# Patient Record
Sex: Male | Born: 1980
Health system: Southern US, Community
[De-identification: ages and names within clinical notes are randomized; demographics above are authoritative.]

## PROBLEM LIST (undated history)

## (undated) DIAGNOSIS — K859 Acute pancreatitis without necrosis or infection, unspecified: Secondary | ICD-10-CM

## (undated) DIAGNOSIS — R109 Unspecified abdominal pain: Secondary | ICD-10-CM

## (undated) DIAGNOSIS — R112 Nausea with vomiting, unspecified: Secondary | ICD-10-CM

## (undated) DIAGNOSIS — K55069 Acute infarction of intestine, part and extent unspecified: Secondary | ICD-10-CM

## (undated) HISTORY — DX: Unspecified abdominal pain: R10.9

## (undated) HISTORY — PX: APPENDECTOMY: SHX54

## (undated) HISTORY — DX: Nausea with vomiting, unspecified: R11.2

---

## 2006-05-28 ENCOUNTER — Emergency Department (HOSPITAL_COMMUNITY): Admission: EM | Admit: 2006-05-28 | Discharge: 2006-05-28 | Payer: Self-pay | Admitting: Family Medicine

## 2008-01-14 ENCOUNTER — Inpatient Hospital Stay (HOSPITAL_COMMUNITY): Admission: EM | Admit: 2008-01-14 | Discharge: 2008-01-14 | Payer: Self-pay | Admitting: Emergency Medicine

## 2008-01-14 ENCOUNTER — Encounter (INDEPENDENT_AMBULATORY_CARE_PROVIDER_SITE_OTHER): Payer: Self-pay | Admitting: General Surgery

## 2011-05-03 NOTE — Op Note (Signed)
NAMEJIA, Paul Allen              ACCOUNT NO.:  [id number]   MEDICAL RECORD NO.:  [id number]          PATIENT TYPE:  INP   LOCATION:  2550                         FACILITY:  [facility]   PHYSICIAN:  Sharlet Salina T. Hoxworth, M.D.DATE OF BIRTH:  28-Feb-1981   DATE OF PROCEDURE:  01/14/2008  DATE OF DISCHARGE:                               OPERATIVE REPORT   PRE AND POSTOPERATIVE DIAGNOSIS:  Acute appendicitis.   SURGICAL PROCEDURES:  Laparoscopic appendectomy.   SURGEON:  Lorne Skeens. Hoxworth, M.D.   ANESTHESIA:  General.   BRIEF HISTORY:  Paul Allen is a 29 year old male who presents with  two days of progressive right lower quadrant abdominal pain.  CT scan  has shown evidence of acute appendicitis.  I have recommended proceeding  with laparoscopic appendectomy.  The nature of the procedure,  indications, risks of bleeding, infection were discussed understood.  He  is now brought to operating room for this procedure.   DESCRIPTION OF OPERATION:  The patient brought to the operating room  placed in supine position on the operating table and general  endotracheal anesthesia was induced.  He received preoperative  antibiotics.  The abdomen was widely sterilely prepped and draped.  Foley catheter was placed.  Correct patient and procedure were verified.  Local anesthesia was used to infiltrate the trocar sites.  A 1 cm  incision was made at the umbilicus and dissection carried down to  midline fascia which was sharply incised for 1 cm.  The peritoneum was  entered under direct vision.  Through mattress suture of 0-0 Vicryl, the  Hasson trocar was placed and pneumoperitoneum established.  Under direct  vision a 5 mm trocar was placed in the right upper quadrant and a 12 mm  trocar in the left lower quadrant.  The appendix was exposed lying just  lateral to the terminal ileum, was acutely inflamed with exudate.  No  gangrene or perforation.  The appendix was carefully, bluntly mobilized  and elevated.  Lateral peritoneal attachments were divided further  mobilizing the appendix.  The mesoappendix were then sequentially  divided with harmonic scalpel, completely freed down to its base which  was not inflamed.  The appendix was divided at the tip of the cecum with  a single firing of the blue load 45 mm stapler.  The appendix placed in  EndoCatch bag and brought through the umbilicus.  The abdomen was  irrigated.  Hemostasis assured.  Trocars removed under direct vision,  all CO2 evacuated.  The mattress sutures for the umbilicus.  Skin  incisions were closed with interrupted subcuticular Monocryl and  Dermabond.  The patient taken to recovery in good condition.      Lorne Skeens. Hoxworth, M.D.  Electronically Signed     BTH/MEDQ  D:  01/14/2008  T:  01/14/2008  Job:  [id number]

## 2011-05-03 NOTE — H&P (Signed)
Paul Allen, Paul Allen              ACCOUNT NO.:  [id number]   MEDICAL RECORD NO.:  [id number]          PATIENT TYPE:  INP   LOCATION:  5007                         FACILITY:  [facility]   PHYSICIAN:  Sharlet Salina T. Hoxworth, M.D.DATE OF BIRTH:  04/26/1981   DATE OF ADMISSION:  01/14/2008  DATE OF DISCHARGE:                              HISTORY & PHYSICAL   CHIEF COMPLAINT:  Right lower quadrant abdominal  pain.   HISTORY OF PRESENT ILLNESS:  This patient is a 30 year old white male  who had the onset about 48 hours ago of progressive constant right lower  quadrant pain.  It has gradually worsened.  It is worse with motion.  He  has not had any nausea, vomiting, fever or chills.  Bowel movements  normal.  No history of any chronic GI complaints or similar symptoms in  the past.   PAST MEDICAL HISTORY:  Negative without previous medical or surgical  illness or hospitalizations.   MEDICATIONS:  None.   ALLERGIES:  PENICILLIN, WHICH SHE SAYS CAUSE DIARRHEA.   SOCIAL HISTORY:  Employed.  Smokes a pack of cigarettes per day and  drinks about six beers a day.   FAMILY HISTORY:  Noncontributory.   REVIEW OF SYSTEMS:  All negative.   PHYSICAL EXAM:  VITAL SIGNS:  Temperature is 98.5, pulse 82,  respirations 18, blood pressure 135/75.  GENERAL:  Mildly overweight white male in no acute distress.  SKIN:  Warm, dry.  No rash or infection.  HEENT:  No masses or thyromegaly.  Sclerae nonicteric.  Oropharynx  clear.  LUNGS:  Clear without wheezing or increased work of bleeding, lymph  nodes nonpalpable.  CARDIAC:  Regular rate and rhythm.  No murmurs.  No edema.  No JVD.  ABDOMEN:  Well-localized right lower quadrant tenderness with guarding.  No palpable masses or hepatosplenomegaly.  EXTREMITIES:  No joint swelling deformity, edema.  NEUROLOGIC:  Alert fully oriented.  Motor and sensory exam grossly  normal.   LABORATORY:  White count normal at 6.8, hemoglobin 48.  Urinalysis shows  7 to  10 white cells.  CT scan of the pelvis shows definite evidence of  acute appendicitis without perforation or abscess.   ASSESSMENT/PLAN:  Probable acute appendicitis.  The patient is being  admitted for emergency appendectomy.  Receiving broad-spectrum  antibiotics.      Lorne Skeens. Hoxworth, M.D.  Electronically Signed     BTH/MEDQ  D:  01/14/2008  T:  01/14/2008  Job:  [id number]

## 2011-09-08 ENCOUNTER — Emergency Department (HOSPITAL_COMMUNITY)
Admission: EM | Admit: 2011-09-08 | Discharge: 2011-09-08 | Disposition: A | Discharge status: Home / Self Care | Payer: 59 | Attending: Emergency Medicine | Admitting: Emergency Medicine

## 2011-09-08 ENCOUNTER — Emergency Department (HOSPITAL_COMMUNITY): Payer: 59

## 2011-09-08 DIAGNOSIS — R509 Fever, unspecified: Secondary | ICD-10-CM | POA: Insufficient documentation

## 2011-09-08 DIAGNOSIS — IMO0001 Reserved for inherently not codable concepts without codable children: Secondary | ICD-10-CM | POA: Insufficient documentation

## 2011-09-08 DIAGNOSIS — J189 Pneumonia, unspecified organism: Secondary | ICD-10-CM | POA: Insufficient documentation

## 2011-09-08 DIAGNOSIS — R000 Tachycardia, unspecified: Secondary | ICD-10-CM | POA: Insufficient documentation

## 2011-09-09 LAB — DIFFERENTIAL
Basophils Absolute: 0
Basophils Relative: 0
Eosinophils Absolute: 0.3
Eosinophils Relative: 4
Monocytes Absolute: 0.5

## 2011-09-09 LAB — CBC
HCT: 42.3
Hemoglobin: 14.8
MCHC: 35
Platelets: 192
RDW: 13.1

## 2011-09-09 LAB — URINALYSIS, ROUTINE W REFLEX MICROSCOPIC
Glucose, UA: NEGATIVE
Hgb urine dipstick: NEGATIVE
Ketones, ur: NEGATIVE
Protein, ur: NEGATIVE
pH: 6.5

## 2011-09-09 LAB — URINE CULTURE

## 2011-09-09 LAB — POCT I-STAT CREATININE: Operator id: 151321

## 2011-09-09 LAB — I-STAT 8, (EC8 V) (CONVERTED LAB)
BUN: 9
Bicarbonate: 29.8 — ABNORMAL HIGH
Glucose, Bld: 97
Hemoglobin: 15.3
TCO2: 31
pCO2, Ven: 55.7 — ABNORMAL HIGH
pH, Ven: 7.336 — ABNORMAL HIGH

## 2011-09-09 LAB — URINE MICROSCOPIC-ADD ON

## 2018-05-13 ENCOUNTER — Encounter (HOSPITAL_COMMUNITY): Payer: Self-pay | Admitting: Emergency Medicine

## 2018-05-13 ENCOUNTER — Emergency Department (HOSPITAL_COMMUNITY)
Admission: EM | Admit: 2018-05-13 | Discharge: 2018-05-13 | Disposition: A | Discharge status: Home / Self Care | Payer: Self-pay | Attending: Emergency Medicine | Admitting: Emergency Medicine

## 2018-05-13 ENCOUNTER — Emergency Department (HOSPITAL_COMMUNITY): Payer: Self-pay

## 2018-05-13 DIAGNOSIS — M25461 Effusion, right knee: Secondary | ICD-10-CM | POA: Insufficient documentation

## 2018-05-13 DIAGNOSIS — M25469 Effusion, unspecified knee: Secondary | ICD-10-CM

## 2018-05-13 DIAGNOSIS — M25561 Pain in right knee: Secondary | ICD-10-CM | POA: Insufficient documentation

## 2018-05-13 LAB — CBC WITH DIFFERENTIAL/PLATELET
Basophils Absolute: 0 10*3/uL (ref 0.0–0.1)
Basophils Relative: 0 %
EOS PCT: 0 %
Eosinophils Absolute: 0 10*3/uL (ref 0.0–0.7)
HEMATOCRIT: 42.9 % (ref 39.0–52.0)
Hemoglobin: 15.1 g/dL (ref 13.0–17.0)
LYMPHS PCT: 16 %
Lymphs Abs: 1.6 10*3/uL (ref 0.7–4.0)
MCH: 35.1 pg — ABNORMAL HIGH (ref 26.0–34.0)
MCHC: 35.2 g/dL (ref 30.0–36.0)
MCV: 99.8 fL (ref 78.0–100.0)
Monocytes Absolute: 1 10*3/uL (ref 0.1–1.0)
Monocytes Relative: 9 %
NEUTROS ABS: 7.7 10*3/uL (ref 1.7–7.7)
NEUTROS PCT: 75 %
PLATELETS: 192 10*3/uL (ref 150–400)
RBC: 4.3 MIL/uL (ref 4.22–5.81)
RDW: 15.4 % (ref 11.5–15.5)
WBC: 10.3 10*3/uL (ref 4.0–10.5)

## 2018-05-13 LAB — GRAM STAIN
Gram Stain: NONE SEEN
SPECIAL REQUESTS: NORMAL

## 2018-05-13 LAB — SYNOVIAL CELL COUNT + DIFF, W/ CRYSTALS
CRYSTALS FLUID: NONE SEEN
Eosinophils-Synovial: 0 % (ref 0–1)
LYMPHOCYTES-SYNOVIAL FLD: 1 % (ref 0–20)
MONOCYTE-MACROPHAGE-SYNOVIAL FLUID: 2 % — AB (ref 50–90)
Neutrophil, Synovial: 96 % — ABNORMAL HIGH (ref 0–25)
WBC, Synovial: 24000 /mm3 — ABNORMAL HIGH (ref 0–200)

## 2018-05-13 LAB — BASIC METABOLIC PANEL
ANION GAP: 15 (ref 5–15)
BUN: <5 mg/dL — ABNORMAL LOW (ref 6–20)
CO2: 26 mmol/L (ref 22–32)
Calcium: 8.7 mg/dL — ABNORMAL LOW (ref 8.9–10.3)
Chloride: 101 mmol/L (ref 101–111)
Creatinine, Ser: 0.59 mg/dL — ABNORMAL LOW (ref 0.61–1.24)
GFR calc Af Amer: >60 mL/min (ref >60)
GLUCOSE: 122 mg/dL — AB (ref 65–99)
Potassium: 2.8 mmol/L — ABNORMAL LOW (ref 3.5–5.1)
Sodium: 142 mmol/L (ref 135–145)

## 2018-05-13 LAB — URIC ACID: URIC ACID, SERUM: 8 mg/dL — AB (ref 4.4–7.6)

## 2018-05-13 LAB — C-REACTIVE PROTEIN: CRP: 1.2 mg/dL — AB (ref <1.0)

## 2018-05-13 LAB — SEDIMENTATION RATE: SED RATE: 5 mm/h (ref 0–16)

## 2018-05-13 MED ORDER — LIDOCAINE HCL 2 % IJ SOLN
10.0000 mL | Freq: Once | INTRAMUSCULAR | Status: AC
  Administered 2018-05-13: 200 mg
  Filled 2018-05-13: qty 20

## 2018-05-13 MED ORDER — LIDOCAINE HCL (PF) 1 % IJ SOLN: 30.0000 mL | Freq: Once | INTRAMUSCULAR | Status: DC

## 2018-05-13 MED ORDER — METHYLPREDNISOLONE 4 MG PO TBPK: ORAL_TABLET | ORAL | 0 refills | Status: DC

## 2018-05-13 MED ORDER — IBUPROFEN 800 MG PO TABS: 800.0000 mg | ORAL_TABLET | Freq: Three times a day (TID) | ORAL | 0 refills | Status: DC | PRN

## 2018-05-13 MED ORDER — OXYCODONE-ACETAMINOPHEN 5-325 MG PO TABS
1.0000 | ORAL_TABLET | Freq: Once | ORAL | Status: AC
Start: 2018-05-13 — End: 2018-05-13
  Administered 2018-05-13: 1 via ORAL
  Filled 2018-05-13: qty 1

## 2018-05-13 MED ORDER — POTASSIUM CHLORIDE CRYS ER 20 MEQ PO TBCR
40.0000 meq | EXTENDED_RELEASE_TABLET | Freq: Once | ORAL | Status: AC
  Administered 2018-05-13: 40 meq via ORAL
  Filled 2018-05-13: qty 2

## 2018-05-13 MED ORDER — MAGNESIUM OXIDE 400 (241.3 MG) MG PO TABS
400.0000 mg | ORAL_TABLET | Freq: Once | ORAL | Status: AC
  Administered 2018-05-13: 400 mg via ORAL
  Filled 2018-05-13: qty 1

## 2018-05-13 MED ORDER — POTASSIUM CHLORIDE 10 MEQ/100ML IV SOLN
10.0000 meq | Freq: Once | INTRAVENOUS | Status: AC
  Administered 2018-05-13: 10 meq via INTRAVENOUS
  Filled 2018-05-13: qty 100

## 2018-05-13 NOTE — ED Provider Notes (Addendum)
[facility] DEPT Provider Note   CSN: [id number] Arrival date & time: 05/13/18  0940     History   Chief Complaint Chief Complaint  Patient presents with  . Knee Pain  . Joint Swelling    knee    HPI Paul Allen is a 37 y.o. male.  The history is provided by the patient and medical records. No language interpreter was used.   Paul Allen is an otherwise healthy 37 y.o. male who presents to the Emergency Department complaining of progressively worsening right knee pain x 4 days. Associated with worsening swelling as well. No known trauma or inciting event. No hx of similar. No redness. Patient does report feeling a tingling sensation to his RLE as well as feeling as if his foot was cold compared to the other.  No fever, chills.  Took Tylenol with little improvement.  Pain is worse with moving the knee or ambulation.  History reviewed. No pertinent past medical history.  There are no active problems to display for this patient.   Past Surgical History:  Procedure Laterality Date  . APPENDECTOMY          Home Medications    Prior to Admission medications   Medication Sig Start Date End Date Taking? Authorizing Provider  acetaminophen (TYLENOL) 500 MG tablet Take 500 mg by mouth every 6 (six) hours as needed for mild pain.   Yes [provider]  ibuprofen (ADVIL,MOTRIN) 800 MG tablet Take 1 tablet (800 mg total) by mouth every 8 (eight) hours as needed for mild pain or moderate pain. 05/13/18   Ward, Ozella Almond, PA-C  methylPREDNISolone (MEDROL DOSEPAK) 4 MG TBPK tablet Take as directed on package. 05/13/18   Ward, Ozella Almond, PA-C    Family History No family history on file.  Social History Social History   Tobacco Use  . Smoking status: Current Every Day Smoker    Types: Cigarettes  . Smokeless tobacco: Never Used  Substance Use Topics  . Alcohol use: Yes  . Drug use: Not on file     Allergies     Penicillins   Review of Systems Review of Systems  Musculoskeletal: Positive for arthralgias and joint swelling.  Skin: Negative for color change and wound.  Neurological: Positive for numbness.  All other systems reviewed and are negative.    Physical Exam Updated Vital Signs BP (!) 131/97   Pulse 77   Temp 98.9 F (37.2 C) (Oral)   Resp 17   Ht '5\' 9"'  (1.753 m)   Wt 77.1 kg (170 lb)   SpO2 100%   BMI 25.10 kg/m   Physical Exam  Constitutional: He is oriented to person, place, and time. He appears well-developed and well-nourished. No distress.  HENT:  Head: Normocephalic and atraumatic.  Neck: Neck supple.  Cardiovascular: Normal rate, regular rhythm and normal heart sounds.  No murmur heard. Pulmonary/Chest: Effort normal and breath sounds normal. No respiratory distress.  Musculoskeletal:  Right knee with diffuse tenderness. Decreased ROM of the knee. + swelling. Sensation intact. 2+ pulse in left LE. Unable to palpate DP pulse initially, but doppler pulse found. Cap refill 2-3 seconds. RLE from mid-shin down cooler to the touch when compared to LLE.   Neurological: He is alert and oriented to person, place, and time.  Skin: Skin is warm and dry.  Nursing note and vitals reviewed.    ED Treatments / Results  Labs (all labs ordered are listed, but only abnormal  results are displayed) Labs Reviewed  URIC ACID - Abnormal; Notable for the following components:      Result Value   Uric Acid, Serum 8.0 (*)    All other components within normal limits  SYNOVIAL CELL COUNT + DIFF, W/ CRYSTALS - Abnormal; Notable for the following components:   Appearance-Synovial TURBID (*)    WBC, Synovial 24,000 (*)    Neutrophil, Synovial 96 (*)    Monocyte-Macrophage-Synovial Fluid 2 (*)    All other components within normal limits  CBC WITH DIFFERENTIAL/PLATELET - Abnormal; Notable for the following components:   MCH 35.1 (*)    All other components within normal limits   BASIC METABOLIC PANEL - Abnormal; Notable for the following components:   Potassium 2.8 (*)    Glucose, Bld 122 (*)    BUN <5 (*)    Creatinine, Ser 0.59 (*)    Calcium 8.7 (*)    All other components within normal limits  GRAM STAIN  BODY FLUID CULTURE  ANAEROBIC CULTURE  GONOCOCCUS CULTURE  SEDIMENTATION RATE  C-REACTIVE PROTEIN    EKG None  Radiology Dg Knee Complete 4 Views Right  Result Date: 05/13/2018 CLINICAL DATA:  Right knee pain and swelling since Thursday without injury. EXAM: RIGHT KNEE - COMPLETE 4+ VIEW COMPARISON:  None. FINDINGS: Large effusion in the suprapatellar bursa. Mild marginal spurring of the patella. No fracture or acute bony findings. IMPRESSION: 1. Large knee effusion, cause uncertain. 2. Mild marginal spurring of the patella. Electronically Signed   By: Van Clines M.D.   On: 05/13/2018 10:20    Procedures Procedures (including critical care time)  Medications Ordered in ED Medications  potassium chloride 10 mEq in 100 mL IVPB (10 mEq Intravenous New Bag/Given 05/13/18 1445)  lidocaine (XYLOCAINE) 2 % (with pres) injection 200 mg (200 mg Infiltration Given by Other 05/13/18 1320)  potassium chloride SA (K-DUR,KLOR-CON) CR tablet 40 mEq (40 mEq Oral Given 05/13/18 1444)  magnesium oxide (MAG-OX) tablet 400 mg (400 mg Oral Given 05/13/18 1444)  oxyCODONE-acetaminophen (PERCOCET/ROXICET) 5-325 MG per tablet 1-2 tablet (1 tablet Oral Given 05/13/18 1501)     Initial Impression / Assessment and Plan / ED Course  I have reviewed the triage vital signs and the nursing notes.  Pertinent labs & imaging results that were available during my care of the patient were reviewed by me and considered in my medical decision making (see chart for details).    Paul Allen is a 37 y.o. male who presents to ED for atraumatic right knee pain / swelling over the last 4 days. On initial examination, knee with significant amount of swelling to the right knee.  He had 2+ DP to left LE, but very faint pulse to RLE. Dr. Darl Householder notified of pulse discrepancy and evaluated patient. Bedside U/S performed by Dr. Darl Householder without signs of DVT. Young male with only risk of claudication being + smoker. Given large amount of fluid to the knee, likely fluid is compressing vasculature leading to diminished pulse. Knee aspiration performed: 24, 000 WBC's. Uric acid minimally elevated at 8.0, however no crystals seen on fluid analysis.  ESR normal with value 5.  Less likely this is due to septic arthritis.  Following knee aspiration, right lower extremity pain warmer and pulse strengthened. Case was discussed with on-call orthopedics, Dr. Marcelino Scot, who agrees this is likely more of an inflammatory arthropathy than acute infectious.  Recommends outpatient follow-up.  Will treat with steroids and as needed pain medication.  Patient  understands home care instructions/follow-up care. NVI on re-evaluation prior to discharge. Reasons to return to ER discussed as well.  All questions answered.    Patient seen by and discussed with Dr. Darl Householder who agrees with treatment plan.    Final Clinical Impressions(s) / ED Diagnoses   Final diagnoses:  Acute pain of right knee  Knee swelling    ED Discharge Orders        Ordered    methylPREDNISolone (MEDROL DOSEPAK) 4 MG TBPK tablet     05/13/18 1512    ibuprofen (ADVIL,MOTRIN) 800 MG tablet  Every 8 hours PRN     05/13/18 1512       Ward, Ozella Almond, PA-C 05/13/18 1538    Drenda Freeze, MD 05/14/18 [phone]

## 2018-05-13 NOTE — ED Notes (Signed)
ED Provider at bedside. 

## 2018-05-13 NOTE — ED Triage Notes (Signed)
Pt c/o right knee pain and swelling since Thursday. Denies any falls or injuries. Reports on way over here had tingling in faces and arms. No neuro deficits at this time.

## 2018-05-13 NOTE — ED Notes (Signed)
ED Providers at bedside

## 2018-05-13 NOTE — ED Notes (Signed)
Patient requesting pain medication, PA made aware.

## 2018-05-13 NOTE — Discharge Instructions (Signed)
It was my pleasure taking care of you today!   Ibuprofen as needed for pain. Take steroid dose pack as directed. This will help with the swelling / inflammation.   Please call the orthopedic doctor listed on Tuesday morning to schedule a follow up appointment.   Return to ER for new or worsening symptoms, any additional concerns.

## 2018-05-13 NOTE — ED Notes (Signed)
Patient placed on cardiac monitoring.

## 2018-05-18 LAB — CULTURE, BODY FLUID W GRAM STAIN -BOTTLE: Culture: NO GROWTH

## 2018-10-24 ENCOUNTER — Other Ambulatory Visit: Payer: Self-pay

## 2018-10-24 ENCOUNTER — Inpatient Hospital Stay (HOSPITAL_COMMUNITY)
Admission: EM | Admit: 2018-10-24 | Discharge: 2018-10-28 | DRG: 439 | Disposition: A | Discharge status: Home / Self Care | Payer: Self-pay | Attending: Internal Medicine | Admitting: Internal Medicine

## 2018-10-24 ENCOUNTER — Emergency Department (HOSPITAL_COMMUNITY): Payer: Self-pay

## 2018-10-24 ENCOUNTER — Encounter (HOSPITAL_COMMUNITY): Payer: Self-pay

## 2018-10-24 DIAGNOSIS — M25461 Effusion, right knee: Secondary | ICD-10-CM | POA: Diagnosis present

## 2018-10-24 DIAGNOSIS — Z9049 Acquired absence of other specified parts of digestive tract: Secondary | ICD-10-CM

## 2018-10-24 DIAGNOSIS — D7589 Other specified diseases of blood and blood-forming organs: Secondary | ICD-10-CM | POA: Diagnosis present

## 2018-10-24 DIAGNOSIS — E538 Deficiency of other specified B group vitamins: Secondary | ICD-10-CM | POA: Diagnosis present

## 2018-10-24 DIAGNOSIS — K852 Alcohol induced acute pancreatitis without necrosis or infection: Principal | ICD-10-CM | POA: Diagnosis present

## 2018-10-24 DIAGNOSIS — E871 Hypo-osmolality and hyponatremia: Secondary | ICD-10-CM | POA: Diagnosis present

## 2018-10-24 DIAGNOSIS — F17210 Nicotine dependence, cigarettes, uncomplicated: Secondary | ICD-10-CM | POA: Diagnosis present

## 2018-10-24 DIAGNOSIS — E876 Hypokalemia: Secondary | ICD-10-CM | POA: Diagnosis present

## 2018-10-24 DIAGNOSIS — D6489 Other specified anemias: Secondary | ICD-10-CM | POA: Diagnosis present

## 2018-10-24 DIAGNOSIS — Z880 Allergy status to penicillin: Secondary | ICD-10-CM

## 2018-10-24 DIAGNOSIS — K529 Noninfective gastroenteritis and colitis, unspecified: Secondary | ICD-10-CM | POA: Diagnosis present

## 2018-10-24 DIAGNOSIS — F1010 Alcohol abuse, uncomplicated: Secondary | ICD-10-CM | POA: Diagnosis present

## 2018-10-24 DIAGNOSIS — K859 Acute pancreatitis without necrosis or infection, unspecified: Secondary | ICD-10-CM | POA: Diagnosis present

## 2018-10-24 DIAGNOSIS — R030 Elevated blood-pressure reading, without diagnosis of hypertension: Secondary | ICD-10-CM | POA: Diagnosis not present

## 2018-10-24 LAB — CBC
HCT: 41.3 % (ref 39.0–52.0)
Hemoglobin: 14.5 g/dL (ref 13.0–17.0)
MCH: 38.9 pg — ABNORMAL HIGH (ref 26.0–34.0)
MCHC: 35.1 g/dL (ref 30.0–36.0)
MCV: 110.7 fL — ABNORMAL HIGH (ref 80.0–100.0)
NRBC: 0 % (ref 0.0–0.2)
Platelets: 242 10*3/uL (ref 150–400)
RBC: 3.73 MIL/uL — ABNORMAL LOW (ref 4.22–5.81)
RDW: 17.2 % — AB (ref 11.5–15.5)
WBC: 11.1 10*3/uL — AB (ref 4.0–10.5)

## 2018-10-24 LAB — URINALYSIS, ROUTINE W REFLEX MICROSCOPIC
Bacteria, UA: NONE SEEN
Bilirubin Urine: NEGATIVE
Glucose, UA: NEGATIVE mg/dL
Hgb urine dipstick: NEGATIVE
Ketones, ur: 5 mg/dL — AB
Leukocytes, UA: NEGATIVE
NITRITE: NEGATIVE
PROTEIN: NEGATIVE mg/dL
SPECIFIC GRAVITY, URINE: 1.021 (ref 1.005–1.030)
pH: 5 (ref 5.0–8.0)

## 2018-10-24 LAB — COMPREHENSIVE METABOLIC PANEL
ALT: 48 U/L — AB (ref 0–44)
AST: 109 U/L — AB (ref 15–41)
Albumin: 4.5 g/dL (ref 3.5–5.0)
Alkaline Phosphatase: 67 U/L (ref 38–126)
Anion gap: 10 (ref 5–15)
BUN: 6 mg/dL (ref 6–20)
CO2: 27 mmol/L (ref 22–32)
CREATININE: 0.53 mg/dL — AB (ref 0.61–1.24)
Calcium: 8.7 mg/dL — ABNORMAL LOW (ref 8.9–10.3)
Chloride: 96 mmol/L — ABNORMAL LOW (ref 98–111)
GFR calc non Af Amer: >60 mL/min (ref >60)
Glucose, Bld: 115 mg/dL — ABNORMAL HIGH (ref 70–99)
Potassium: 3.1 mmol/L — ABNORMAL LOW (ref 3.5–5.1)
SODIUM: 133 mmol/L — AB (ref 135–145)
TOTAL PROTEIN: 7.1 g/dL (ref 6.5–8.1)
Total Bilirubin: 1.4 mg/dL — ABNORMAL HIGH (ref 0.3–1.2)

## 2018-10-24 LAB — LIPASE, BLOOD: LIPASE: 187 U/L — AB (ref 11–51)

## 2018-10-24 LAB — MAGNESIUM: Magnesium: 1.6 mg/dL — ABNORMAL LOW (ref 1.7–2.4)

## 2018-10-24 MED ORDER — THIAMINE HCL 100 MG/ML IJ SOLN
100.0000 mg | Freq: Every day | INTRAMUSCULAR | Status: DC
  Filled 2018-10-24 (×2): qty 2

## 2018-10-24 MED ORDER — FOLIC ACID 1 MG PO TABS
1.0000 mg | ORAL_TABLET | Freq: Every day | ORAL | Status: DC
  Administered 2018-10-25 – 2018-10-28 (×3): 1 mg via ORAL
  Filled 2018-10-24 (×4): qty 1

## 2018-10-24 MED ORDER — ENOXAPARIN SODIUM 40 MG/0.4ML ~~LOC~~ SOLN
40.0000 mg | Freq: Every day | SUBCUTANEOUS | Status: DC
  Administered 2018-10-24 – 2018-10-27 (×4): 40 mg via SUBCUTANEOUS
  Filled 2018-10-24 (×4): qty 0.4

## 2018-10-24 MED ORDER — HYDROMORPHONE HCL 1 MG/ML IJ SOLN
1.0000 mg | Freq: Once | INTRAMUSCULAR | Status: AC
  Administered 2018-10-24: 1 mg via INTRAVENOUS
  Filled 2018-10-24: qty 1

## 2018-10-24 MED ORDER — SODIUM CHLORIDE 0.9 % IV BOLUS
1000.0000 mL | Freq: Once | INTRAVENOUS | Status: AC
  Administered 2018-10-24: 1000 mL via INTRAVENOUS

## 2018-10-24 MED ORDER — IOPAMIDOL (ISOVUE-300) INJECTION 61%
INTRAVENOUS | Status: AC
  Filled 2018-10-24: qty 100

## 2018-10-24 MED ORDER — LORAZEPAM 2 MG/ML IJ SOLN
1.0000 mg | Freq: Four times a day (QID) | INTRAMUSCULAR | Status: AC | PRN
  Administered 2018-10-25: 1 mg via INTRAVENOUS
  Filled 2018-10-24: qty 1

## 2018-10-24 MED ORDER — LORAZEPAM 1 MG PO TABS
1.0000 mg | ORAL_TABLET | Freq: Four times a day (QID) | ORAL | Status: AC | PRN
  Filled 2018-10-24: qty 1

## 2018-10-24 MED ORDER — VITAMIN B-1 100 MG PO TABS
100.0000 mg | ORAL_TABLET | Freq: Every day | ORAL | Status: DC
  Administered 2018-10-25 – 2018-10-28 (×4): 100 mg via ORAL
  Filled 2018-10-24 (×4): qty 1

## 2018-10-24 MED ORDER — POTASSIUM CHLORIDE IN NACL 40-0.9 MEQ/L-% IV SOLN
INTRAVENOUS | Status: DC
  Administered 2018-10-24 – 2018-10-25 (×2): 175 mL/h via INTRAVENOUS
  Filled 2018-10-24 (×3): qty 1000

## 2018-10-24 MED ORDER — IOPAMIDOL (ISOVUE-300) INJECTION 61%
100.0000 mL | Freq: Once | INTRAVENOUS | Status: AC | PRN
  Administered 2018-10-24: 100 mL via INTRAVENOUS

## 2018-10-24 MED ORDER — SODIUM CHLORIDE (PF) 0.9 % IJ SOLN
INTRAMUSCULAR | Status: AC
  Filled 2018-10-24: qty 50

## 2018-10-24 MED ORDER — ADULT MULTIVITAMIN W/MINERALS CH
1.0000 | ORAL_TABLET | Freq: Every day | ORAL | Status: DC
  Administered 2018-10-25 – 2018-10-28 (×3): 1 via ORAL
  Filled 2018-10-24 (×4): qty 1

## 2018-10-24 MED ORDER — HYDROMORPHONE HCL 1 MG/ML IJ SOLN: 1.0000 mg | INTRAMUSCULAR | Status: DC | PRN

## 2018-10-24 MED ORDER — FENTANYL CITRATE (PF) 100 MCG/2ML IJ SOLN
25.0000 ug | Freq: Once | INTRAMUSCULAR | Status: AC
  Administered 2018-10-24: 25 ug via INTRAVENOUS
  Filled 2018-10-24: qty 2

## 2018-10-24 NOTE — ED Provider Notes (Signed)
[facility]-EMERGENCY DEPT Provider Note   CSN: [id number] Arrival date & time: 10/24/18  1605     History   Chief Complaint Chief Complaint  Patient presents with  . Abdominal Pain    HPI ARTHOR GORTER is a 37 y.o. male.  37 y.o male with no PMH presents to the ED with a chief complaint of abdominal pain since this morning. He describes his pain as a constant pulling, pressure located on the left upper quadrant with radiation to the epigastric region. He reports his pain worsen when walking or taking a deep breath. He has not tried any therapy for pain relieve. Patient does report a previous appendectomy but no other surgical interventions to his abdomen.He reports drinking 5-6 malt beers daily. Patient also reports changes in his urine habits and having some urgency while attempting to urinate.He denies any nausea, vomiting, diarrhea, chest pain or shortness of breath.      History reviewed. No pertinent past medical history.  There are no active problems to display for this patient.   Past Surgical History:  Procedure Laterality Date  . APPENDECTOMY          Home Medications    Prior to Admission medications   Medication Sig Start Date End Date Taking? Authorizing Provider  acetaminophen (TYLENOL) 500 MG tablet Take 1,000 mg by mouth every 6 (six) hours as needed.   Yes [provider]  ibuprofen (ADVIL,MOTRIN) 200 MG tablet Take 400 mg by mouth daily as needed.   Yes [provider]  ibuprofen (ADVIL,MOTRIN) 800 MG tablet Take 1 tablet (800 mg total) by mouth every 8 (eight) hours as needed for mild pain or moderate pain. Patient not taking: Reported on 10/24/2018 05/13/18   Ward, Chase Picket, PA-C  methylPREDNISolone (MEDROL DOSEPAK) 4 MG TBPK tablet Take as directed on package. Patient not taking: Reported on 10/24/2018 05/13/18   Ward, Chase Picket, PA-C    Family History No family history on file.  Social  History Social History   Tobacco Use  . Smoking status: Current Every Day Smoker    Packs/day: 1.50    Types: Cigarettes  . Smokeless tobacco: Never Used  Substance Use Topics  . Alcohol use: Yes    Alcohol/week: 5.0 standard drinks    Types: 5 Cans of beer per week  . Drug use: Yes    Types: Marijuana     Allergies   Penicillins   Review of Systems Review of Systems  Constitutional: Negative for fever.  HENT: Negative for sore throat.   Respiratory: Negative for shortness of breath.   Cardiovascular: Negative for chest pain.  Gastrointestinal: Positive for abdominal pain. Negative for constipation, diarrhea, nausea and vomiting.  Genitourinary: Negative for dysuria and flank pain.  Musculoskeletal: Negative for back pain.  Skin: Negative for pallor and wound.  Neurological: Negative for light-headedness.     Physical Exam Updated Vital Signs BP (!) 163/101 (BP Location: Right Arm)   Pulse 70   Temp 98.6 F (37 C) (Oral)   Resp 16   Ht 5\' 9"  (1.753 m)   Wt 78 kg   SpO2 98%   BMI 25.40 kg/m   Physical Exam  Constitutional: He is oriented to person, place, and time. He appears well-developed and well-nourished.  HENT:  Head: Normocephalic and atraumatic.  Mouth/Throat: Oropharynx is clear and moist.  Eyes: Pupils are equal, round, and reactive to light. No scleral icterus.  Neck: Normal range of motion.  Cardiovascular: Normal  heart sounds.  Pulmonary/Chest: Effort normal and breath sounds normal. He has no wheezes. He exhibits no tenderness.  Abdominal: Soft. Bowel sounds are normal. He exhibits no distension. There is tenderness in the epigastric area, periumbilical area and left upper quadrant. There is rigidity and guarding. There is no rebound, no CVA tenderness and no tenderness at McBurney's point.  Musculoskeletal: He exhibits no tenderness or deformity.  Neurological: He is alert and oriented to person, place, and time.  Skin: Skin is warm and dry.   Nursing note and vitals reviewed.    ED Treatments / Results  Labs (all labs ordered are listed, but only abnormal results are displayed) Labs Reviewed  LIPASE, BLOOD - Abnormal; Notable for the following components:      Result Value   Lipase 187 (*)    All other components within normal limits  COMPREHENSIVE METABOLIC PANEL - Abnormal; Notable for the following components:   Sodium 133 (*)    Potassium 3.1 (*)    Chloride 96 (*)    Glucose, Bld 115 (*)    Creatinine, Ser 0.53 (*)    Calcium 8.7 (*)    AST 109 (*)    ALT 48 (*)    Total Bilirubin 1.4 (*)    All other components within normal limits  CBC - Abnormal; Notable for the following components:   WBC 11.1 (*)    RBC 3.73 (*)    MCV 110.7 (*)    MCH 38.9 (*)    RDW 17.2 (*)    All other components within normal limits  URINALYSIS, ROUTINE W REFLEX MICROSCOPIC - Abnormal; Notable for the following components:   APPearance TURBID (*)    Ketones, ur 5 (*)    All other components within normal limits    EKG None  Radiology Ct Abdomen Pelvis W Contrast  Result Date: 10/24/2018 CLINICAL DATA:  Abdominal pain EXAM: CT ABDOMEN AND PELVIS WITH CONTRAST TECHNIQUE: Multidetector CT imaging of the abdomen and pelvis was performed using the standard protocol following bolus administration of intravenous contrast. CONTRAST:  ISOVUE-300 IOPAMIDOL (ISOVUE-300) INJECTION 61% COMPARISON:  CT 01/13/2008 FINDINGS: Lower chest: Lung bases demonstrate no acute consolidation or effusion. The heart size is within normal limits. Hepatobiliary: Heterogeneous hypodensity near the gallbladder fossa and within the inferior right hepatic lobe. No calcified gallstone. No biliary dilatation Pancreas: Indistinct with surrounding edema and fluid in the left anterior pararenal space suspect for pancreatitis. No necrosis. No organized fluid collection. Spleen: Normal in size without focal abnormality. Adrenals/Urinary Tract: Adrenal glands are  unremarkable. Kidneys are normal, without renal calculi, focal lesion, or hydronephrosis. Bladder is unremarkable. Stomach/Bowel: Stomach is within normal limits. Duodenum is also slightly indistinct. Status post appendectomy. Prominent submucosal fat deposition in the right colon consistent with chronic inflammatory process. Slight wall thickening and mucosal enhancement involving the sigmoid colon. Vascular/Lymphatic: Nonaneurysmal aorta. Minimal aortic atherosclerosis. No significantly enlarged lymph nodes Reproductive: Prostate is unremarkable. Other: No free air.  Trace free fluid in the pelvis and abdomen Musculoskeletal: No acute or significant osseous findings. IMPRESSION: 1. Indistinct appearance of the pancreas with surrounding inflammation and small fluid consistent with acute pancreatitis. No organized fluid collection. No necrosis. 2. Slightly indistinct appearance of the second and third portion of duodenum, possible duodenitis or reactive change from pancreatic inflammation 3. Slightly thickened appearance of the sigmoid colon with mildly prominent mucosal enhancement, suggesting low-grade inflammatory process/colitis 4. Heterogenous hypodensity within the right lobe of liver and adjacent to the gallbladder fossa, may  reflect heterogenous fat infiltration of the liver. Electronically Signed   By: Jasmine Pang M.D.   On: 10/24/2018 21:11    Procedures Procedures (including critical care time)  Medications Ordered in ED Medications  iopamidol (ISOVUE-300) 61 % injection (has no administration in time range)  sodium chloride (PF) 0.9 % injection (has no administration in time range)  sodium chloride 0.9 % bolus 1,000 mL (has no administration in time range)  sodium chloride 0.9 % bolus 1,000 mL (1,000 mLs Intravenous New Bag/Given 10/24/18 2011)  fentaNYL (SUBLIMAZE) injection 25 mcg (25 mcg Intravenous Given 10/24/18 2009)  iopamidol (ISOVUE-300) 61 % injection 100 mL (100 mLs Intravenous  Contrast Given 10/24/18 2021)  HYDROmorphone (DILAUDID) injection 1 mg (1 mg Intravenous Given 10/24/18 2057)     Initial Impression / Assessment and Plan / ED Course  I have reviewed the triage vital signs and the nursing notes.  Pertinent labs & imaging results that were available during my care of the patient were reviewed by me and considered in my medical decision making (see chart for details).    Presents with new onset of left upper quadrant pain which began this morning radiating into the epigastric region.  Taking any medication for pain relief.     During examination patient is exquisitely tender on the left upper quadrant along with epigastric region along with generalized abdomen. CMP showed slight decrease in sodium, potassium slightly decreased at 3.1 however patient denies any weakness at this time.  AST and ALTs are elevated AST 109 ALT 48 lipase was 187, CBC showed 11.1 will order CT check for any pancreatitis, liver pathology, gallbladder pathology. UA showed no nitrites, leukocytes, bacteria, patient does report some urgency.  CT abdomen and pelvis showed:  1. Indistinct appearance of the pancreas with surrounding  inflammation and small fluid consistent with acute pancreatitis. No  organized fluid collection. No necrosis.  2. Slightly indistinct appearance of the second and third portion of  duodenum, possible duodenitis or reactive change from pancreatic  inflammation  3. Slightly thickened appearance of the sigmoid colon with mildly  prominent mucosal enhancement, suggesting low-grade inflammatory  process/colitis  4. Heterogenous hypodensity within the right lobe of liver and  adjacent to the gallbladder fossa, may reflect heterogenous fat  infiltration of the liver.   9:34 PM chest pain at this time is controlled after 1 of Dilaudid will provide him with another bolus, I have discussed this patient's care with Dr. Clarice Pole who further recommends admission, will  call hospitalist to admit for acute pancreatitis.   9:46 PM Spoke to hospitalist who will evaluate patient and admit for acute pancreatitis.  Final Clinical Impressions(s) / ED Diagnoses   Final diagnoses:  Acute pancreatitis, unspecified complication status, unspecified pancreatitis type    ED Discharge Orders    None       Claude Manges, Cordelia Poche 10/24/18 2146    Arby Barrette, MD 10/25/18 1257

## 2018-10-24 NOTE — ED Triage Notes (Signed)
Pt states LUQ abd pain since 1200 today. Pt describing pain as constant. Pt states pain has spread to bilateral flanks. Pt states increased pain with movement and respirations.

## 2018-10-24 NOTE — ED Notes (Signed)
ED TO INPATIENT HANDOFF REPORT  Name/Age/Gender Paul Allen 37 y.o. male  Code Status    Code Status Orders  (From admission, onward)         Start     Ordered   10/24/18 2202  Full code  Continuous     10/24/18 2205        Code Status History    This patient has a current code status but no historical code status.      Home/SNF/Other Home  Chief Complaint severe abdominal pains, upper back pains  Level of Care/Admitting Diagnosis ED Disposition    ED Disposition Condition Comment   Admit  Hospital Area: [facility] [[id number]]  Level of Care: Med-Surg [16]  Diagnosis: Acute pancreatitis [577.0.ICD-9-CM]  Admitting Physician: Shela Leff [[id number]]  Attending Physician: Shela Leff [[id number]]  PT Class (Do Not Modify): Observation [104]  PT Acc Code (Do Not Modify): Observation [10022]       Medical History History reviewed. No pertinent past medical history.  Allergies Allergies  Allergen Reactions  . Penicillins Rash    Has patient had a PCN reaction causing immediate rash, facial/tongue/throat swelling, SOB or lightheadedness with hypotension: yes Has patient had a PCN reaction causing severe rash involving mucus membranes or skin necrosis: Yes Has patient had a PCN reaction that required hospitalization: No Has patient had a PCN reaction occurring within the last 10 years: No If all of the above answers are "NO", then may proceed with Cephalosporin use.     IV Location/Drains/Wounds Patient Lines/Drains/Airways Status   Active Line/Drains/Airways    Name:   Placement date:   Placement time:   Site:   Days:   Peripheral IV 10/24/18 Left Arm   10/24/18    2011    Arm   less than 1          Labs/Imaging Results for orders placed or performed during the hospital encounter of 10/24/18 (from the past 48 hour(s))  Lipase, blood     Status: Abnormal   Collection Time: 10/24/18  4:40 PM  Result Value Ref Range    Lipase 187 (H) 11 - 51 U/L    Comment: Performed at [facility], [place] [address], [place], [place] [address]  Comprehensive metabolic panel     Status: Abnormal   Collection Time: 10/24/18  4:40 PM  Result Value Ref Range   Sodium 133 (L) 135 - 145 mmol/L   Potassium 3.1 (L) 3.5 - 5.1 mmol/L   Chloride 96 (L) 98 - 111 mmol/L   CO2 27 22 - 32 mmol/L   Glucose, Bld 115 (H) 70 - 99 mg/dL   BUN 6 6 - 20 mg/dL   Creatinine, Ser 0.53 (L) 0.61 - 1.24 mg/dL   Calcium 8.7 (L) 8.9 - 10.3 mg/dL   Total Protein 7.1 6.5 - 8.1 g/dL   Albumin 4.5 3.5 - 5.0 g/dL   AST 109 (H) 15 - 41 U/L   ALT 48 (H) 0 - 44 U/L   Alkaline Phosphatase 67 38 - 126 U/L   Total Bilirubin 1.4 (H) 0.3 - 1.2 mg/dL   GFR calc non Af Amer >60 >60 mL/min   GFR calc Af Amer >60 >60 mL/min    Comment: (NOTE) The eGFR has been calculated using the CKD EPI equation. This calculation has not been validated in all clinical situations. eGFR's persistently <60 mL/min signify possible Chronic Kidney Disease.    Anion gap 10 5 - 15  Comment: Performed at [facility], [place] [address] [place], [place] [address]  CBC     Status: Abnormal   Collection Time: 10/24/18  4:40 PM  Result Value Ref Range   WBC 11.1 (H) 4.0 - 10.5 K/uL   RBC 3.73 (L) 4.22 - 5.81 MIL/uL   Hemoglobin 14.5 13.0 - 17.0 g/dL   HCT 41.3 39.0 - 52.0 %   MCV 110.7 (H) 80.0 - 100.0 fL   MCH 38.9 (H) 26.0 - 34.0 pg   MCHC 35.1 30.0 - 36.0 g/dL   RDW 17.2 (H) 11.5 - 15.5 %   Platelets 242 150 - 400 K/uL   nRBC 0.0 0.0 - 0.2 %    Comment: Performed at [facility], [place] [address] [place], [place] [address]  Urinalysis, Routine w reflex microscopic     Status: Abnormal   Collection Time: 10/24/18  4:40 PM  Result Value Ref Range   Color, Urine YELLOW YELLOW   APPearance TURBID (A) CLEAR   Specific Gravity, Urine 1.021 1.005 - 1.030   pH 5.0 5.0 - 8.0   Glucose, UA NEGATIVE NEGATIVE mg/dL   Hgb  urine dipstick NEGATIVE NEGATIVE   Bilirubin Urine NEGATIVE NEGATIVE   Ketones, ur 5 (A) NEGATIVE mg/dL   Protein, ur NEGATIVE NEGATIVE mg/dL   Nitrite NEGATIVE NEGATIVE   Leukocytes, UA NEGATIVE NEGATIVE   Bacteria, UA NONE SEEN NONE SEEN   Mucus PRESENT    Amorphous Crystal PRESENT     Comment: Performed at [facility] [address], [place], [place] [address]   Ct Abdomen Pelvis W Contrast  Result Date: 10/24/2018 CLINICAL DATA:  Abdominal pain EXAM: CT ABDOMEN AND PELVIS WITH CONTRAST TECHNIQUE: Multidetector CT imaging of the abdomen and pelvis was performed using the standard protocol following bolus administration of intravenous contrast. CONTRAST:  192m ISOVUE-300 IOPAMIDOL (ISOVUE-300) INJECTION 61% COMPARISON:  CT 01/13/2008 FINDINGS: Lower chest: Lung bases demonstrate no acute consolidation or effusion. The heart size is within normal limits. Hepatobiliary: Heterogeneous hypodensity near the gallbladder fossa and within the inferior right hepatic lobe. No calcified gallstone. No biliary dilatation Pancreas: Indistinct with surrounding edema and fluid in the left anterior pararenal space suspect for pancreatitis. No necrosis. No organized fluid collection. Spleen: Normal in size without focal abnormality. Adrenals/Urinary Tract: Adrenal glands are unremarkable. Kidneys are normal, without renal calculi, focal lesion, or hydronephrosis. Bladder is unremarkable. Stomach/Bowel: Stomach is within normal limits. Duodenum is also slightly indistinct. Status post appendectomy. Prominent submucosal fat deposition in the right colon consistent with chronic inflammatory process. Slight wall thickening and mucosal enhancement involving the sigmoid colon. Vascular/Lymphatic: Nonaneurysmal aorta. Minimal aortic atherosclerosis. No significantly enlarged lymph nodes Reproductive: Prostate is unremarkable. Other: No free air.  Trace free fluid in the pelvis and abdomen  Musculoskeletal: No acute or significant osseous findings. IMPRESSION: 1. Indistinct appearance of the pancreas with surrounding inflammation and small fluid consistent with acute pancreatitis. No organized fluid collection. No necrosis. 2. Slightly indistinct appearance of the second and third portion of duodenum, possible duodenitis or reactive change from pancreatic inflammation 3. Slightly thickened appearance of the sigmoid colon with mildly prominent mucosal enhancement, suggesting low-grade inflammatory process/colitis 4. Heterogenous hypodensity within the right lobe of liver and adjacent to the gallbladder fossa, may reflect heterogenous fat infiltration of the liver. Electronically Signed   By: KDonavan FoilM.D.   On: 10/24/2018 21:11   None  Pending Labs [employer](From admission, onward)    Start  Ordered   10/25/18 1749  Basic metabolic panel  Tomorrow morning,   R     10/24/18 2205   10/25/18 0500  CBC  Tomorrow morning,   R     10/24/18 2205   10/25/18 0500  Hepatic function panel  Tomorrow morning,   R     10/24/18 2205   10/24/18 2204  Magnesium  Add-on,   R     10/24/18 2205   10/24/18 2200  HIV antibody (Routine Testing)  Once,   R     10/24/18 2205          Vitals/Pain Today's Vitals   10/24/18 2040 10/24/18 2044 10/24/18 2107 10/24/18 2217  BP: (!) 163/101   (!) 150/101  Pulse: 70   82  Resp: 16   16  Temp:      TempSrc:      SpO2: 100%  98% 100%  Weight:      Height:      PainSc:  10-Worst pain ever      Isolation Precautions No active isolations  Medications Medications  iopamidol (ISOVUE-300) 61 % injection (has no administration in time range)  sodium chloride (PF) 0.9 % injection (has no administration in time range)  sodium chloride 0.9 % bolus 1,000 mL (1,000 mLs Intravenous New Bag/Given 10/24/18 2130)  HYDROmorphone (DILAUDID) injection 1 mg (has no administration in time range)  enoxaparin (LOVENOX) injection 40 mg (has no  administration in time range)  LORazepam (ATIVAN) tablet 1 mg (has no administration in time range)    Or  LORazepam (ATIVAN) injection 1 mg (has no administration in time range)  thiamine (VITAMIN B-1) tablet 100 mg (has no administration in time range)    Or  thiamine (B-1) injection 100 mg (has no administration in time range)  folic acid (FOLVITE) tablet 1 mg (has no administration in time range)  multivitamin with minerals tablet 1 tablet (has no administration in time range)  0.9 % NaCl with KCl 40 mEq / L  infusion (has no administration in time range)  sodium chloride 0.9 % bolus 1,000 mL (0 mLs Intravenous Stopped 10/24/18 2141)  fentaNYL (SUBLIMAZE) injection 25 mcg (25 mcg Intravenous Given 10/24/18 2009)  iopamidol (ISOVUE-300) 61 % injection 100 mL (100 mLs Intravenous Contrast Given 10/24/18 2021)  HYDROmorphone (DILAUDID) injection 1 mg (1 mg Intravenous Given 10/24/18 2057)    Mobility walks

## 2018-10-24 NOTE — H&P (Signed)
History and Physical    Paul Allen:[id number] DOB: 06-03-1981 DOA: 10/24/2018  PCP: Patient, No Pcp Per Patient coming from: Home  Chief Complaint: Abdominal pain  HPI: Paul Allen is a 37 y.o. male with medical history significant of prior appendectomy presenting to the hospital for evaluation of abdominal pain.  Patient reports having abdominal pain since 11 AM today.  Pain is located in the left upper quadrant and radiates to the back.  It is constant and intermittently worse, "12 out of 10" in intensity.  Associated with chills.  No nausea or vomiting.  Patient reports drinking 4-6 beers daily; last drink was at 11:30 AM today.  ED Course: Afebrile and hemodynamically stable.  White count 11.1.  Lipase 187.  AST 109, ALT 48, alk phos 67, and T bili 1.4.  UA not suggestive of infection.  CT abdomen pelvis with evidence of acute pancreatitis; no necrosis.  Also slightly indistinct appearance of the second and third portion of the duodenum, possible duodenitis or reactive change from pancreatic inflammation.  Showing slightly thickened appearance of the sigmoid colon with mildly prominent mucosal enhancement, suggesting low-grade inflammatory process/colitis.  Patient received fentanyl, Dilaudid, and 2 L normal saline boluses in the ED.  [facility] paged to admit.  Review of Systems: As per HPI otherwise 10 point review of systems negative.  History reviewed. No pertinent past medical history.  Past Surgical History:  Procedure Laterality Date  . APPENDECTOMY       reports that he has been smoking cigarettes. He has been smoking about 1.50 packs per day. He has never used smokeless tobacco. He reports that he drinks about 5.0 standard drinks of alcohol per week. He reports that he has current or past drug history. Drug: Marijuana.  Allergies  Allergen Reactions  . Penicillins Rash    Has patient had a PCN reaction causing immediate rash, facial/tongue/throat swelling, SOB or  lightheadedness with hypotension: yes Has patient had a PCN reaction causing severe rash involving mucus membranes or skin necrosis: Yes Has patient had a PCN reaction that required hospitalization: No Has patient had a PCN reaction occurring within the last 10 years: No If all of the above answers are "NO", then may proceed with Cephalosporin use.     No family history on file.  Prior to Admission medications   Medication Sig Start Date End Date Taking? Authorizing Provider  acetaminophen (TYLENOL) 500 MG tablet Take 1,000 mg by mouth every 6 (six) hours as needed.   Yes [provider]  ibuprofen (ADVIL,MOTRIN) 200 MG tablet Take 400 mg by mouth daily as needed.   Yes [provider]  ibuprofen (ADVIL,MOTRIN) 800 MG tablet Take 1 tablet (800 mg total) by mouth every 8 (eight) hours as needed for mild pain or moderate pain. Patient not taking: Reported on 10/24/2018 05/13/18   Ward, Ozella Almond, PA-C  methylPREDNISolone (MEDROL DOSEPAK) 4 MG TBPK tablet Take as directed on package. Patient not taking: Reported on 10/24/2018 05/13/18   Ward, Ozella Almond, PA-C    Physical Exam: Vitals:   10/24/18 1611 10/24/18 2040 10/24/18 2107 10/24/18 2217  BP: (!) 168/97 (!) 163/101  (!) 150/101  Pulse: 83 70  82  Resp: _0 Temp: 98.6 F (37 C)     TempSrc: Oral     SpO2: 100% 100% 98% 100%  Weight: 78 kg     Height: _1  (1.753 m)       Physical Exam  Constitutional:  He is oriented to person, place, and time. He appears well-developed and well-nourished.  HENT:  Head: Normocephalic.  Mouth/Throat: Oropharynx is clear and moist.  Eyes: Right eye exhibits no discharge. Left eye exhibits no discharge.  Neck: Neck supple. No tracheal deviation present.  Cardiovascular: Normal rate, regular rhythm and intact distal pulses.  Pulmonary/Chest: Effort normal and breath sounds normal. No respiratory distress. He has no wheezes. He has no rales.  Abdominal: Soft. Bowel  sounds are normal. He exhibits no distension. There is tenderness.  Epigastrium tender to light palpation  Musculoskeletal: He exhibits no edema.  Neurological: He is alert and oriented to person, place, and time.  Skin: Skin is warm and dry. He is not diaphoretic.  Psychiatric: He has a normal mood and affect. His behavior is normal.     Labs on Admission: I have personally reviewed following labs and imaging studies  CBC: Recent Labs  Lab 10/24/18 1640  WBC 11.1*  HGB 14.5  HCT 41.3  MCV 110.7*  PLT 389   Basic Metabolic Panel: Recent Labs  Lab 10/24/18 1640  NA 133*  K 3.1*  CL 96*  CO2 27  GLUCOSE 115*  BUN 6  CREATININE 0.53*  CALCIUM 8.7*   GFR: Estimated Creatinine Clearance: 125.2 mL/min (A) (by C-G formula based on SCr of 0.53 mg/dL (L)). Liver Function Tests: Recent Labs  Lab 10/24/18 1640  AST 109*  ALT 48*  ALKPHOS 67  BILITOT 1.4*  PROT 7.1  ALBUMIN 4.5   Recent Labs  Lab 10/24/18 1640  LIPASE 187*   No results for input(s): AMMONIA in the last 168 hours. Coagulation Profile: No results for input(s): INR, PROTIME in the last 168 hours. Cardiac Enzymes: No results for input(s): CKTOTAL, CKMB, CKMBINDEX, TROPONINI in the last 168 hours. BNP (last 3 results) No results for input(s): PROBNP in the last 8760 hours. HbA1C: No results for input(s): HGBA1C in the last 72 hours. CBG: No results for input(s): GLUCAP in the last 168 hours. Lipid Profile: No results for input(s): CHOL, HDL, LDLCALC, TRIG, CHOLHDL, LDLDIRECT in the last 72 hours. Thyroid Function Tests: No results for input(s): TSH, T4TOTAL, FREET4, T3FREE, THYROIDAB in the last 72 hours. Anemia Panel: No results for input(s): VITAMINB12, FOLATE, FERRITIN, TIBC, IRON, RETICCTPCT in the last 72 hours. Urine analysis:    Component Value Date/Time   COLORURINE YELLOW 10/24/2018 1640   APPEARANCEUR TURBID (A) 10/24/2018 1640   LABSPEC 1.021 10/24/2018 1640   PHURINE 5.0  10/24/2018 1640   GLUCOSEU NEGATIVE 10/24/2018 1640   HGBUR NEGATIVE 10/24/2018 1640   BILIRUBINUR NEGATIVE 10/24/2018 1640   KETONESUR 5 (A) 10/24/2018 1640   PROTEINUR NEGATIVE 10/24/2018 1640   UROBILINOGEN 1.0 01/13/2008 2059   NITRITE NEGATIVE 10/24/2018 1640   LEUKOCYTESUR NEGATIVE 10/24/2018 1640    Radiological Exams on Admission: Ct Abdomen Pelvis W Contrast  Result Date: 10/24/2018 CLINICAL DATA:  Abdominal pain EXAM: CT ABDOMEN AND PELVIS WITH CONTRAST TECHNIQUE: Multidetector CT imaging of the abdomen and pelvis was performed using the standard protocol following bolus administration of intravenous contrast. CONTRAST:  169m ISOVUE-300 IOPAMIDOL (ISOVUE-300) INJECTION 61% COMPARISON:  CT 01/13/2008 FINDINGS: Lower chest: Lung bases demonstrate no acute consolidation or effusion. The heart size is within normal limits. Hepatobiliary: Heterogeneous hypodensity near the gallbladder fossa and within the inferior right hepatic lobe. No calcified gallstone. No biliary dilatation Pancreas: Indistinct with surrounding edema and fluid in the left anterior pararenal space suspect for pancreatitis. No necrosis. No organized fluid collection. Spleen:  Normal in size without focal abnormality. Adrenals/Urinary Tract: Adrenal glands are unremarkable. Kidneys are normal, without renal calculi, focal lesion, or hydronephrosis. Bladder is unremarkable. Stomach/Bowel: Stomach is within normal limits. Duodenum is also slightly indistinct. Status post appendectomy. Prominent submucosal fat deposition in the right colon consistent with chronic inflammatory process. Slight wall thickening and mucosal enhancement involving the sigmoid colon. Vascular/Lymphatic: Nonaneurysmal aorta. Minimal aortic atherosclerosis. No significantly enlarged lymph nodes Reproductive: Prostate is unremarkable. Other: No free air.  Trace free fluid in the pelvis and abdomen Musculoskeletal: No acute or significant osseous findings.  IMPRESSION: 1. Indistinct appearance of the pancreas with surrounding inflammation and small fluid consistent with acute pancreatitis. No organized fluid collection. No necrosis. 2. Slightly indistinct appearance of the second and third portion of duodenum, possible duodenitis or reactive change from pancreatic inflammation 3. Slightly thickened appearance of the sigmoid colon with mildly prominent mucosal enhancement, suggesting low-grade inflammatory process/colitis 4. Heterogenous hypodensity within the right lobe of liver and adjacent to the gallbladder fossa, may reflect heterogenous fat infiltration of the liver. Electronically Signed   By: Donavan Foil M.D.   On: 10/24/2018 21:11    Assessment/Plan Principal Problem:   Acute alcoholic pancreatitis Active Problems:   Colitis   Hyponatremia   Hypokalemia   Macrocytosis   Alcohol abuse   Acute alcoholic pancreatitis, colitis -In the setting of daily alcohol use.  Afebrile and hemodynamically stable.  White count 11.1.  Lipase 187.  AST 109, ALT 48, alk phos 67, and T bili 1.4. -CT abdomen pelvis with evidence of acute pancreatitis; no necrosis.  Also slightly indistinct appearance of the second and third portion of the duodenum, possible duodenitis or reactive change from pancreatic inflammation.  Showing slightly thickened appearance of the sigmoid colon with mildly prominent mucosal enhancement, suggesting low-grade inflammatory process/colitis.CT without evidence of gallstones. -IV fluid resuscitation -Clear liquid diet; advance as tolerated -Dilaudid 1 mg every 3 hours as needed for severe pain -CBC in a.m. -Hepatic function panel in a.m.  Mild hyponatremia Sodium 133.  -Continue IV fluid -BMP in a.m.  Hypokalemia Potassium 3.1. -Check magnesium level -Replete potassium -BMP in a.m.  Macrocytosis Likely due to folate deficiency in the setting of alcohol abuse.  Hemoglobin normal at 14.5 with MCV 027. -Folic acid  supplement  Alcohol abuse Drinks 4-6 beers daily; last drink at 11:30 AM on 11/6. -CIWA monitoring; Ativan PRN -Thiamine, folate, multivitamin -Counseled patient on alcohol cessation.  He appears to be in the pre-contemplative stage at this time.  DVT prophylaxis: Lovenox Family Communication: No family present at bedside. Disposition Plan: Anticipate discharge to home in 1 to 2 days. Consults called: None Admission status: Observation   Shela Leff MD [facility] Pager [phone]  If 7PM-7AM, please contact night-coverage [email or web address] Password [facility]  10/24/2018, 10:25 PM

## 2018-10-25 ENCOUNTER — Other Ambulatory Visit: Payer: Self-pay

## 2018-10-25 DIAGNOSIS — K859 Acute pancreatitis without necrosis or infection, unspecified: Secondary | ICD-10-CM | POA: Diagnosis present

## 2018-10-25 LAB — BASIC METABOLIC PANEL
ANION GAP: 8 (ref 5–15)
CALCIUM: 8.5 mg/dL — AB (ref 8.9–10.3)
CO2: 24 mmol/L (ref 22–32)
Chloride: 103 mmol/L (ref 98–111)
Creatinine, Ser: 0.49 mg/dL — ABNORMAL LOW (ref 0.61–1.24)
GFR calc Af Amer: >60 mL/min (ref >60)
GFR calc non Af Amer: >60 mL/min (ref >60)
GLUCOSE: 103 mg/dL — AB (ref 70–99)
POTASSIUM: 3.7 mmol/L (ref 3.5–5.1)
Sodium: 135 mmol/L (ref 135–145)

## 2018-10-25 LAB — HEPATIC FUNCTION PANEL
ALK PHOS: 63 U/L (ref 38–126)
ALT: 36 U/L (ref 0–44)
AST: 64 U/L — ABNORMAL HIGH (ref 15–41)
Albumin: 3.9 g/dL (ref 3.5–5.0)
BILIRUBIN DIRECT: 0.6 mg/dL — AB (ref 0.0–0.2)
BILIRUBIN INDIRECT: 1.4 mg/dL — AB (ref 0.3–0.9)
Total Bilirubin: 2 mg/dL — ABNORMAL HIGH (ref 0.3–1.2)
Total Protein: 6.2 g/dL — ABNORMAL LOW (ref 6.5–8.1)

## 2018-10-25 LAB — CBC
HCT: 37.2 % — ABNORMAL LOW (ref 39.0–52.0)
HEMOGLOBIN: 12.8 g/dL — AB (ref 13.0–17.0)
MCH: 39.1 pg — AB (ref 26.0–34.0)
MCHC: 34.4 g/dL (ref 30.0–36.0)
MCV: 113.8 fL — ABNORMAL HIGH (ref 80.0–100.0)
Platelets: 184 10*3/uL (ref 150–400)
RBC: 3.27 MIL/uL — AB (ref 4.22–5.81)
RDW: 17.2 % — ABNORMAL HIGH (ref 11.5–15.5)
WBC: 7.9 10*3/uL (ref 4.0–10.5)
nRBC: 0 % (ref 0.0–0.2)

## 2018-10-25 LAB — HIV ANTIBODY (ROUTINE TESTING W REFLEX): HIV SCREEN 4TH GENERATION: NONREACTIVE

## 2018-10-25 MED ORDER — POTASSIUM CHLORIDE 2 MEQ/ML IV SOLN
INTRAVENOUS | Status: DC
  Administered 2018-10-25: 13:00:00 via INTRAVENOUS
  Filled 2018-10-25 (×4): qty 1000

## 2018-10-25 MED ORDER — ONDANSETRON HCL 4 MG/2ML IJ SOLN
4.0000 mg | Freq: Four times a day (QID) | INTRAMUSCULAR | Status: DC | PRN
  Administered 2018-10-25 (×2): 4 mg via INTRAVENOUS
  Filled 2018-10-25 (×2): qty 2

## 2018-10-25 MED ORDER — MORPHINE SULFATE (PF) 2 MG/ML IV SOLN
2.0000 mg | INTRAVENOUS | Status: DC | PRN
  Administered 2018-10-25 – 2018-10-28 (×5): 2 mg via INTRAVENOUS
  Filled 2018-10-25 (×5): qty 1

## 2018-10-25 MED ORDER — POTASSIUM CHLORIDE 2 MEQ/ML IV SOLN
INTRAVENOUS | Status: DC
  Administered 2018-10-25 – 2018-10-27 (×4): via INTRAVENOUS
  Filled 2018-10-25 (×11): qty 1000

## 2018-10-25 MED ORDER — MAGNESIUM SULFATE 2 GM/50ML IV SOLN
2.0000 g | Freq: Once | INTRAVENOUS | Status: AC
  Administered 2018-10-25: 2 g via INTRAVENOUS
  Filled 2018-10-25: qty 50

## 2018-10-25 NOTE — Progress Notes (Signed)
"  I have reviewed and concur with this patient's documentation." 

## 2018-10-25 NOTE — Progress Notes (Signed)
PROGRESS NOTE    Paul Allen  IEP:[id number] DOB: March 31, 1981 DOA: 10/24/2018 PCP: Patient, No Pcp Per    Brief Narrative: Paul Allen is a 37 y.o. male with medical history significant of prior appendectomy presenting to the hospital for evaluation of abdominal pain.  Patient reports having abdominal pain since 11 AM today.  Pain is located in the left upper quadrant and radiates to the back.  It is constant and intermittently worse, "12 out of 10" in intensity.  Associated with chills.  No nausea or vomiting.  Patient reports drinking 4-6 beers daily; last drink was at 11:30 AM today.  ED Course: Afebrile and hemodynamically stable.  White count 11.1.  Lipase 187.  AST 109, ALT 48, alk phos 67, and T bili 1.4.  UA not suggestive of infection.  CT abdomen pelvis with evidence of acute pancreatitis; no necrosis.  Also slightly indistinct appearance of the second and third portion of the duodenum, possible duodenitis or reactive change from pancreatic inflammation.  Showing slightly thickened appearance of the sigmoid colon with mildly prominent mucosal enhancement, suggesting low-grade inflammatory process/colitis.  Patient received fentanyl, Dilaudid, and 2 L normal saline boluses in the ED.  [facility] paged to admit.    Assessment & Plan:   Principal Problem:   Acute alcoholic pancreatitis Active Problems:   Colitis   Hyponatremia   Hypokalemia   Macrocytosis   Alcohol abuse   Acute pancreatitis   1-Acute alcoholic pancreatitis.  Continue with IV fluids.  Change dilaudid to morphine due to nausea form dilaudid.  zofran for nausea PRN.  NPO.  Counseling provided.   2-Hypokalemia; resolved.   3-Transaminases; from alcohol abuse.   4-Hypomagnesemia; received IV magnesium.  Repeat labs in am.   5-? Low grade colitis on CT scan. Monitor for symptoms.   6-Alcohol abuse; CIWA.  Counseled provided.   Macrocytosis; related to alcohol.     RN Pressure Injury  Documentation:    Malnutrition Type:      Malnutrition Characteristics:      Nutrition Interventions:     Estimated body mass index is 25.4 kg/m as calculated from the following:   Height as of this encounter: '5\' 9"'  (1.753 m).   Weight as of this encounter: 78 kg.   DVT prophylaxis: Lovenox Code Status: full code.  Family Communication: care discussed with patient  Disposition Plan: remain inpatient for treatment of pancreatitis. Continue with NPO, IV fluids.   Consultants:   none   Procedures: none  Antimicrobials: none  Subjective: Still vomiting. Complain of abdominal pain 7/10   Objective: Vitals:   10/25/18 0557 10/25/18 0930 10/25/18 1145 10/25/18 1340  BP: 127/78 128/81 130/84 130/86  Pulse: 70 67 69 64  Resp:  16  14  Temp:  98.8 F (37.1 C)  98.7 F (37.1 C)  TempSrc:  Oral  Oral  SpO2:  99%  100%  Weight:      Height:        Intake/Output Summary (Last 24 hours) at 10/25/2018 1700 Last data filed at 10/25/2018 1400 Gross per 24 hour  Intake 1116.87 ml  Output 1300 ml  Net -183.13 ml   Filed Weights   10/24/18 1611  Weight: 78 kg    Examination:  General exam: Appears calm and comfortable  Respiratory system: Clear to auscultation. Respiratory effort normal. Cardiovascular system: S1 & S2 heard, RRR. No JVD, murmurs, rubs, gallops or clicks. No pedal edema. Gastrointestinal system: BS present, soft, mild distended.  Central nervous  system: Alert and oriented. No focal neurological deficits. Extremities: Symmetric 5 x 5 power. Skin: No rashes, lesions or ulcers Psychiatry: Judgement and insight appear normal. Mood & affect appropriate.     Data Reviewed: I have personally reviewed following labs and imaging studies  CBC: Recent Labs  Lab 10/24/18 1640 10/25/18 0507  WBC 11.1* 7.9  HGB 14.5 12.8*  HCT 41.3 37.2*  MCV 110.7* 113.8*  PLT 242 202   Basic Metabolic Panel: Recent Labs  Lab 10/24/18 1640 10/24/18 2200  10/25/18 0507  NA 133*  --  135  K 3.1*  --  3.7  CL 96*  --  103  CO2 27  --  24  GLUCOSE 115*  --  103*  BUN 6  --  <5*  CREATININE 0.53*  --  0.49*  CALCIUM 8.7*  --  8.5*  MG  --  1.6*  --    GFR: Estimated Creatinine Clearance: 125.2 mL/min (A) (by C-G formula based on SCr of 0.49 mg/dL (L)). Liver Function Tests: Recent Labs  Lab 10/24/18 1640 10/25/18 0507  AST 109* 64*  ALT 48* 36  ALKPHOS 67 63  BILITOT 1.4* 2.0*  PROT 7.1 6.2*  ALBUMIN 4.5 3.9   Recent Labs  Lab 10/24/18 1640  LIPASE 187*   No results for input(s): AMMONIA in the last 168 hours. Coagulation Profile: No results for input(s): INR, PROTIME in the last 168 hours. Cardiac Enzymes: No results for input(s): CKTOTAL, CKMB, CKMBINDEX, TROPONINI in the last 168 hours. BNP (last 3 results) No results for input(s): PROBNP in the last 8760 hours. HbA1C: No results for input(s): HGBA1C in the last 72 hours. CBG: No results for input(s): GLUCAP in the last 168 hours. Lipid Profile: No results for input(s): CHOL, HDL, LDLCALC, TRIG, CHOLHDL, LDLDIRECT in the last 72 hours. Thyroid Function Tests: No results for input(s): TSH, T4TOTAL, FREET4, T3FREE, THYROIDAB in the last 72 hours. Anemia Panel: No results for input(s): VITAMINB12, FOLATE, FERRITIN, TIBC, IRON, RETICCTPCT in the last 72 hours. Sepsis Labs: No results for input(s): PROCALCITON, LATICACIDVEN in the last 168 hours.  No results found for this or any previous visit (from the past 240 hour(s)).       Radiology Studies: Ct Abdomen Pelvis W Contrast  Result Date: 10/24/2018 CLINICAL DATA:  Abdominal pain EXAM: CT ABDOMEN AND PELVIS WITH CONTRAST TECHNIQUE: Multidetector CT imaging of the abdomen and pelvis was performed using the standard protocol following bolus administration of intravenous contrast. CONTRAST:  124m ISOVUE-300 IOPAMIDOL (ISOVUE-300) INJECTION 61% COMPARISON:  CT 01/13/2008 FINDINGS: Lower chest: Lung bases demonstrate  no acute consolidation or effusion. The heart size is within normal limits. Hepatobiliary: Heterogeneous hypodensity near the gallbladder fossa and within the inferior right hepatic lobe. No calcified gallstone. No biliary dilatation Pancreas: Indistinct with surrounding edema and fluid in the left anterior pararenal space suspect for pancreatitis. No necrosis. No organized fluid collection. Spleen: Normal in size without focal abnormality. Adrenals/Urinary Tract: Adrenal glands are unremarkable. Kidneys are normal, without renal calculi, focal lesion, or hydronephrosis. Bladder is unremarkable. Stomach/Bowel: Stomach is within normal limits. Duodenum is also slightly indistinct. Status post appendectomy. Prominent submucosal fat deposition in the right colon consistent with chronic inflammatory process. Slight wall thickening and mucosal enhancement involving the sigmoid colon. Vascular/Lymphatic: Nonaneurysmal aorta. Minimal aortic atherosclerosis. No significantly enlarged lymph nodes Reproductive: Prostate is unremarkable. Other: No free air.  Trace free fluid in the pelvis and abdomen Musculoskeletal: No acute or significant osseous findings. IMPRESSION: 1. Indistinct  appearance of the pancreas with surrounding inflammation and small fluid consistent with acute pancreatitis. No organized fluid collection. No necrosis. 2. Slightly indistinct appearance of the second and third portion of duodenum, possible duodenitis or reactive change from pancreatic inflammation 3. Slightly thickened appearance of the sigmoid colon with mildly prominent mucosal enhancement, suggesting low-grade inflammatory process/colitis 4. Heterogenous hypodensity within the right lobe of liver and adjacent to the gallbladder fossa, may reflect heterogenous fat infiltration of the liver. Electronically Signed   By: Donavan Foil M.D.   On: 10/24/2018 21:11        Scheduled Meds: . enoxaparin (LOVENOX) injection  40 mg Subcutaneous  QHS  . folic acid  1 mg Oral Daily  . multivitamin with minerals  1 tablet Oral Daily  . thiamine  100 mg Oral Daily   Or  . thiamine  100 mg Intravenous Daily   Continuous Infusions: . dextrose 5 %-0.9% NaCl with KCl/Additives Pediatric custom IV fluid 150 mL/hr at 10/25/18 1230     LOS: 0 days    Time spent: 35 minutes.     Elmarie Shiley, MD [facility] Pager [phone]  If 7PM-7AM, please contact night-coverage [email or web address] Password [email or web address] 10/25/2018, 5:00 PM

## 2018-10-26 LAB — HEPATIC FUNCTION PANEL
ALT: 27 U/L (ref 0–44)
AST: 47 U/L — AB (ref 15–41)
Albumin: 3.6 g/dL (ref 3.5–5.0)
Alkaline Phosphatase: 66 U/L (ref 38–126)
BILIRUBIN DIRECT: 0.3 mg/dL — AB (ref 0.0–0.2)
BILIRUBIN INDIRECT: 0.5 mg/dL (ref 0.3–0.9)
Total Bilirubin: 0.8 mg/dL (ref 0.3–1.2)
Total Protein: 6.1 g/dL — ABNORMAL LOW (ref 6.5–8.1)

## 2018-10-26 LAB — BASIC METABOLIC PANEL
ANION GAP: 7 (ref 5–15)
CALCIUM: 8.3 mg/dL — AB (ref 8.9–10.3)
CO2: 26 mmol/L (ref 22–32)
Chloride: 103 mmol/L (ref 98–111)
Creatinine, Ser: 0.49 mg/dL — ABNORMAL LOW (ref 0.61–1.24)
GFR calc Af Amer: >60 mL/min (ref >60)
Glucose, Bld: 109 mg/dL — ABNORMAL HIGH (ref 70–99)
POTASSIUM: 3.6 mmol/L (ref 3.5–5.1)
Sodium: 136 mmol/L (ref 135–145)

## 2018-10-26 LAB — CBC
HCT: 36.7 % — ABNORMAL LOW (ref 39.0–52.0)
Hemoglobin: 12.2 g/dL — ABNORMAL LOW (ref 13.0–17.0)
MCH: 39.2 pg — AB (ref 26.0–34.0)
MCHC: 33.2 g/dL (ref 30.0–36.0)
MCV: 118 fL — ABNORMAL HIGH (ref 80.0–100.0)
Platelets: 168 10*3/uL (ref 150–400)
RBC: 3.11 MIL/uL — ABNORMAL LOW (ref 4.22–5.81)
RDW: 17.1 % — AB (ref 11.5–15.5)
WBC: 8.2 10*3/uL (ref 4.0–10.5)
nRBC: 0 % (ref 0.0–0.2)

## 2018-10-26 LAB — MAGNESIUM: Magnesium: 1.9 mg/dL (ref 1.7–2.4)

## 2018-10-26 MED ORDER — NICOTINE 14 MG/24HR TD PT24
14.0000 mg | MEDICATED_PATCH | Freq: Every day | TRANSDERMAL | Status: DC
  Administered 2018-10-26 – 2018-10-27 (×2): 14 mg via TRANSDERMAL
  Filled 2018-10-26 (×2): qty 1

## 2018-10-26 NOTE — Progress Notes (Signed)
PROGRESS NOTE    Paul Allen  IDP:[id number] DOB: 10-Jul-1981 DOA: 10/24/2018 PCP: Patient, No Pcp Per    Brief Narrative: Paul Allen is a 37 y.o. male with medical history significant of prior appendectomy presenting to the hospital for evaluation of abdominal pain.  Patient reports having abdominal pain since 11 AM today.  Pain is located in the left upper quadrant and radiates to the back.  It is constant and intermittently worse, "12 out of 10" in intensity.  Associated with chills.  No nausea or vomiting.  Patient reports drinking 4-6 beers daily; last drink was at 11:30 AM today.  ED Course: Afebrile and hemodynamically stable.  White count 11.1.  Lipase 187.  AST 109, ALT 48, alk phos 67, and T bili 1.4.  UA not suggestive of infection.  CT abdomen pelvis with evidence of acute pancreatitis; no necrosis.  Also slightly indistinct appearance of the second and third portion of the duodenum, possible duodenitis or reactive change from pancreatic inflammation.  Showing slightly thickened appearance of the sigmoid colon with mildly prominent mucosal enhancement, suggesting low-grade inflammatory process/colitis.  Patient received fentanyl, Dilaudid, and 2 L normal saline boluses in the ED.  TR[facility] paged to admit.    Assessment & Plan:   Principal Problem:   Acute alcoholic pancreatitis Active Problems:   Colitis   Hyponatremia   Hypokalemia   Macrocytosis   Alcohol abuse   Acute pancreatitis   1-Acute alcoholic pancreatitis.  Continue with IV fluids.  Change dilaudid to morphine due to nausea form dilaudid.  zofran for nausea PRN.  Trial of clears.  Counseling provided.   2-Hypokalemia; resolved.   3-Transaminases; from alcohol abuse.  Trending down.   4-Hypomagnesemia; received IV magnesium.  Resolved/.   5-? Low grade colitis on CT scan. Monitor for symptoms.  Report loose stool. Will check stool culture.   6-Alcohol abuse; CIWA.  Counseled provided.    Macrocytosis; related to alcohol.     RN Pressure Injury Documentation:    Malnutrition Type:      Malnutrition Characteristics:      Nutrition Interventions:     Estimated body mass index is 25.4 kg/m as calculated from the following:   Height as of this encounter: '5\' 9"'  (1.753 m).   Weight as of this encounter: 78 kg.   DVT prophylaxis: Lovenox Code Status: full code.  Family Communication: care discussed with patient  Disposition Plan: remain inpatient for treatment of pancreatitis. Continue with NPO, IV fluids.   Consultants:   none   Procedures: none  Antimicrobials: none  Subjective: Pain some what better., no further vomiting.   Objective: Vitals:   10/25/18 1340 10/25/18 2224 10/26/18 0600 10/26/18 1350  BP: 130/86 139/85 122/73 (!) 165/95  Pulse: 64 65 62 (!) 57  Resp: '14 18 16 16  ' Temp: 98.7 F (37.1 C) 99.8 F (37.7 C) 98.2 F (36.8 C)   TempSrc: Oral Oral Oral   SpO2: 100% 98% 96% 100%  Weight:      Height:        Intake/Output Summary (Last 24 hours) at 10/26/2018 1536 Last data filed at 10/26/2018 1500 Gross per 24 hour  Intake 3467.43 ml  Output 540 ml  Net 2927.43 ml   Filed Weights   10/24/18 1611  Weight: 78 kg    Examination:  General exam: NAD Respiratory system: CTA Cardiovascular system:  S 1, S 2 RRR Gastrointestinal system: BS present, soft, nt Central nervous system: non focal.  Extremities:  Symmetric power.  Skin: No rashes  Data Reviewed: I have personally reviewed following labs and imaging studies  CBC: Recent Labs  Lab 10/24/18 1640 10/25/18 0507 10/26/18 0415  WBC 11.1* 7.9 8.2  HGB 14.5 12.8* 12.2*  HCT 41.3 37.2* 36.7*  MCV 110.7* 113.8* 118.0*  PLT 242 184 993   Basic Metabolic Panel: Recent Labs  Lab 10/24/18 1640 10/24/18 2200 10/25/18 0507 10/26/18 0415  NA 133*  --  135 136  K 3.1*  --  3.7 3.6  CL 96*  --  103 103  CO2 27  --  24 26  GLUCOSE 115*  --  103* 109*  BUN 6   --  <5* <5*  CREATININE 0.53*  --  0.49* 0.49*  CALCIUM 8.7*  --  8.5* 8.3*  MG  --  1.6*  --  1.9   GFR: Estimated Creatinine Clearance: 125.2 mL/min (A) (by C-G formula based on SCr of 0.49 mg/dL (L)). Liver Function Tests: Recent Labs  Lab 10/24/18 1640 10/25/18 0507 10/26/18 0415  AST 109* 64* 47*  ALT 48* 36 27  ALKPHOS 67 63 66  BILITOT 1.4* 2.0* 0.8  PROT 7.1 6.2* 6.1*  ALBUMIN 4.5 3.9 3.6   Recent Labs  Lab 10/24/18 1640  LIPASE 187*   No results for input(s): AMMONIA in the last 168 hours. Coagulation Profile: No results for input(s): INR, PROTIME in the last 168 hours. Cardiac Enzymes: No results for input(s): CKTOTAL, CKMB, CKMBINDEX, TROPONINI in the last 168 hours. BNP (last 3 results) No results for input(s): PROBNP in the last 8760 hours. HbA1C: No results for input(s): HGBA1C in the last 72 hours. CBG: No results for input(s): GLUCAP in the last 168 hours. Lipid Profile: No results for input(s): CHOL, HDL, LDLCALC, TRIG, CHOLHDL, LDLDIRECT in the last 72 hours. Thyroid Function Tests: No results for input(s): TSH, T4TOTAL, FREET4, T3FREE, THYROIDAB in the last 72 hours. Anemia Panel: No results for input(s): VITAMINB12, FOLATE, FERRITIN, TIBC, IRON, RETICCTPCT in the last 72 hours. Sepsis Labs: No results for input(s): PROCALCITON, LATICACIDVEN in the last 168 hours.  No results found for this or any previous visit (from the past 240 hour(s)).       Radiology Studies: Ct Abdomen Pelvis W Contrast  Result Date: 10/24/2018 CLINICAL DATA:  Abdominal pain EXAM: CT ABDOMEN AND PELVIS WITH CONTRAST TECHNIQUE: Multidetector CT imaging of the abdomen and pelvis was performed using the standard protocol following bolus administration of intravenous contrast. CONTRAST:  184m ISOVUE-300 IOPAMIDOL (ISOVUE-300) INJECTION 61% COMPARISON:  CT 01/13/2008 FINDINGS: Lower chest: Lung bases demonstrate no acute consolidation or effusion. The heart size is within  normal limits. Hepatobiliary: Heterogeneous hypodensity near the gallbladder fossa and within the inferior right hepatic lobe. No calcified gallstone. No biliary dilatation Pancreas: Indistinct with surrounding edema and fluid in the left anterior pararenal space suspect for pancreatitis. No necrosis. No organized fluid collection. Spleen: Normal in size without focal abnormality. Adrenals/Urinary Tract: Adrenal glands are unremarkable. Kidneys are normal, without renal calculi, focal lesion, or hydronephrosis. Bladder is unremarkable. Stomach/Bowel: Stomach is within normal limits. Duodenum is also slightly indistinct. Status post appendectomy. Prominent submucosal fat deposition in the right colon consistent with chronic inflammatory process. Slight wall thickening and mucosal enhancement involving the sigmoid colon. Vascular/Lymphatic: Nonaneurysmal aorta. Minimal aortic atherosclerosis. No significantly enlarged lymph nodes Reproductive: Prostate is unremarkable. Other: No free air.  Trace free fluid in the pelvis and abdomen Musculoskeletal: No acute or significant osseous findings. IMPRESSION: 1. Indistinct appearance  of the pancreas with surrounding inflammation and small fluid consistent with acute pancreatitis. No organized fluid collection. No necrosis. 2. Slightly indistinct appearance of the second and third portion of duodenum, possible duodenitis or reactive change from pancreatic inflammation 3. Slightly thickened appearance of the sigmoid colon with mildly prominent mucosal enhancement, suggesting low-grade inflammatory process/colitis 4. Heterogenous hypodensity within the right lobe of liver and adjacent to the gallbladder fossa, may reflect heterogenous fat infiltration of the liver. Electronically Signed   By: Donavan Foil M.D.   On: 10/24/2018 21:11        Scheduled Meds: . enoxaparin (LOVENOX) injection  40 mg Subcutaneous QHS  . folic acid  1 mg Oral Daily  . multivitamin with  minerals  1 tablet Oral Daily  . nicotine  14 mg Transdermal Daily  . thiamine  100 mg Oral Daily   Or  . thiamine  100 mg Intravenous Daily   Continuous Infusions: . dextrose 5 %-0.9% nacl with kcl 125 mL/hr at 10/26/18 1230     LOS: 1 day    Time spent: 35 minutes.     Elmarie Shiley, MD [facility] Pager [phone]  If 7PM-7AM, please contact night-coverage [email or web address] Password [facility] 10/26/2018, 3:36 PM

## 2018-10-27 LAB — GASTROINTESTINAL PANEL BY PCR, STOOL (REPLACES STOOL CULTURE)

## 2018-10-27 LAB — CBC
HEMATOCRIT: 31.8 % — AB (ref 39.0–52.0)
Hemoglobin: 10.9 g/dL — ABNORMAL LOW (ref 13.0–17.0)
MCH: 39.4 pg — ABNORMAL HIGH (ref 26.0–34.0)
MCHC: 34.3 g/dL (ref 30.0–36.0)
MCV: 114.8 fL — AB (ref 80.0–100.0)
NRBC: 0 % (ref 0.0–0.2)
Platelets: 152 10*3/uL (ref 150–400)
RBC: 2.77 MIL/uL — AB (ref 4.22–5.81)
RDW: 16.6 % — AB (ref 11.5–15.5)
WBC: 7 10*3/uL (ref 4.0–10.5)

## 2018-10-27 LAB — BASIC METABOLIC PANEL
ANION GAP: 8 (ref 5–15)
BUN: <5 mg/dL — ABNORMAL LOW (ref 6–20)
CHLORIDE: 103 mmol/L (ref 98–111)
CO2: 23 mmol/L (ref 22–32)
CREATININE: 0.41 mg/dL — AB (ref 0.61–1.24)
Calcium: 8.3 mg/dL — ABNORMAL LOW (ref 8.9–10.3)
GFR calc Af Amer: >60 mL/min (ref >60)
GFR calc non Af Amer: >60 mL/min (ref >60)
GLUCOSE: 102 mg/dL — AB (ref 70–99)
Potassium: 3.2 mmol/L — ABNORMAL LOW (ref 3.5–5.1)
Sodium: 134 mmol/L — ABNORMAL LOW (ref 135–145)

## 2018-10-27 MED ORDER — POTASSIUM CHLORIDE CRYS ER 20 MEQ PO TBCR
40.0000 meq | EXTENDED_RELEASE_TABLET | Freq: Once | ORAL | Status: AC
  Administered 2018-10-27: 40 meq via ORAL
  Filled 2018-10-27: qty 2

## 2018-10-27 MED ORDER — HYDRALAZINE HCL 20 MG/ML IJ SOLN: 5.0000 mg | Freq: Four times a day (QID) | INTRAMUSCULAR | Status: DC | PRN

## 2018-10-27 MED ORDER — TRAMADOL HCL 50 MG PO TABS
50.0000 mg | ORAL_TABLET | Freq: Four times a day (QID) | ORAL | Status: DC | PRN
  Administered 2018-10-28 (×2): 50 mg via ORAL
  Filled 2018-10-27 (×2): qty 1

## 2018-10-27 NOTE — Progress Notes (Signed)
PROGRESS NOTE    Paul Allen  HFW:[id number] DOB: 10/17/1981 DOA: 10/24/2018 PCP: Patient, No Pcp Per    Brief Narrative: Paul Allen is a 37 y.o. male with medical history significant of prior appendectomy presenting to the hospital for evaluation of abdominal pain.  Patient reports having abdominal pain since 11 AM today.  Pain is located in the left upper quadrant and radiates to the back.  It is constant and intermittently worse, "12 out of 10" in intensity.  Associated with chills.  No nausea or vomiting.  Patient reports drinking 4-6 beers daily; last drink was at 11:30 AM today.  ED Course: Afebrile and hemodynamically stable.  White count 11.1.  Lipase 187.  AST 109, ALT 48, alk phos 67, and T bili 1.4.  UA not suggestive of infection.  CT abdomen pelvis with evidence of acute pancreatitis; no necrosis.  Also slightly indistinct appearance of the second and third portion of the duodenum, possible duodenitis or reactive change from pancreatic inflammation.  Showing slightly thickened appearance of the sigmoid colon with mildly prominent mucosal enhancement, suggesting low-grade inflammatory process/colitis.  Patient received fentanyl, Dilaudid, and 2 L normal saline boluses in the ED.  TRH paged to admit.    Assessment & Plan:   Principal Problem:   Acute alcoholic pancreatitis Active Problems:   Colitis   Hyponatremia   Hypokalemia   Macrocytosis   Alcohol abuse   Acute pancreatitis   1-Acute alcoholic pancreatitis.  Continue with IV fluids.  Change dilaudid to morphine due to nausea form dilaudid.  zofran for nausea PRN.  Pain improved. Plan to advance to full liquid and then to low fat  Counseling provided.   2-Hypokalemia; replete orally   3-Transaminases; from alcohol abuse.  Trending down.   4-Hypomagnesemia; received IV magnesium.  Resolved/.   5-? Low grade colitis on CT scan. Monitor for symptoms.  Report loose stool. No significant diarrhea.    Stool culture pending.   6-Alcohol abuse; CIWA.  Counseled provided.   Macrocytosis; related to alcohol.  Anemia; of acute illness.  Monitor hb.  Hemodilution ?  Repeat labs in am.   Elevated BP; PRN medication. Stop IV fluids.    RN Pressure Injury Documentation:    Malnutrition Type:      Malnutrition Characteristics:      Nutrition Interventions:     Estimated body mass index is 25.4 kg/m as calculated from the following:   Height as of this encounter: '5\' 9"'  (1.753 m).   Weight as of this encounter: 78 kg.   DVT prophylaxis: Lovenox Code Status: full code.  Family Communication: care discussed with patient  Disposition Plan: remain inpatient for treatment of pancreatitis. Continue with NPO, IV fluids.   Consultants:   none   Procedures: none  Antimicrobials: none  Subjective: Abdominal pain better 3/10. Willing to advanced diet.  Only had one BM yesterday    Objective: Vitals:   10/26/18 2118 10/26/18 2300 10/27/18 0652 10/27/18 0945  BP: (!) 157/91 (!) 157/91 (!) 149/94 (!) 165/94  Pulse: (!) 55 (!) 55 (!) 53 (!) 54  Resp: 18  18   Temp: 99.4 F (37.4 C)     TempSrc: Oral     SpO2: 99%  99%   Weight:      Height:        Intake/Output Summary (Last 24 hours) at 10/27/2018 1307 Last data filed at 10/27/2018 1100 Gross per 24 hour  Intake 2893.26 ml  Output 1000 ml  Net 1893.26 ml   Filed Weights   10/24/18 1611  Weight: 78 kg    Examination:  General exam: NAD Respiratory system: CTA Cardiovascular system: S 1, S 2 RRR Gastrointestinal system: BS present, soft, nt Central nervous system: Non focal.  Extremities: Symmetric power.    Data Reviewed: I have personally reviewed following labs and imaging studies  CBC: Recent Labs  Lab 10/24/18 1640 10/25/18 0507 10/26/18 0415 10/27/18 0407  WBC 11.1* 7.9 8.2 7.0  HGB 14.5 12.8* 12.2* 10.9*  HCT 41.3 37.2* 36.7* 31.8*  MCV 110.7* 113.8* 118.0* 114.8*  PLT 242 184 168  093   Basic Metabolic Panel: Recent Labs  Lab 10/24/18 1640 10/24/18 2200 10/25/18 0507 10/26/18 0415 10/27/18 0407  NA 133*  --  135 136 134*  K 3.1*  --  3.7 3.6 3.2*  CL 96*  --  103 103 103  CO2 27  --  '24 26 23  ' GLUCOSE 115*  --  103* 109* 102*  BUN 6  --  <5* <5* <5*  CREATININE 0.53*  --  0.49* 0.49* 0.41*  CALCIUM 8.7*  --  8.5* 8.3* 8.3*  MG  --  1.6*  --  1.9  --    GFR: Estimated Creatinine Clearance: 125.2 mL/min (A) (by C-G formula based on SCr of 0.41 mg/dL (L)). Liver Function Tests: Recent Labs  Lab 10/24/18 1640 10/25/18 0507 10/26/18 0415  AST 109* 64* 47*  ALT 48* 36 27  ALKPHOS 67 63 66  BILITOT 1.4* 2.0* 0.8  PROT 7.1 6.2* 6.1*  ALBUMIN 4.5 3.9 3.6   Recent Labs  Lab 10/24/18 1640  LIPASE 187*   No results for input(s): AMMONIA in the last 168 hours. Coagulation Profile: No results for input(s): INR, PROTIME in the last 168 hours. Cardiac Enzymes: No results for input(s): CKTOTAL, CKMB, CKMBINDEX, TROPONINI in the last 168 hours. BNP (last 3 results) No results for input(s): PROBNP in the last 8760 hours. HbA1C: No results for input(s): HGBA1C in the last 72 hours. CBG: No results for input(s): GLUCAP in the last 168 hours. Lipid Profile: No results for input(s): CHOL, HDL, LDLCALC, TRIG, CHOLHDL, LDLDIRECT in the last 72 hours. Thyroid Function Tests: No results for input(s): TSH, T4TOTAL, FREET4, T3FREE, THYROIDAB in the last 72 hours. Anemia Panel: No results for input(s): VITAMINB12, FOLATE, FERRITIN, TIBC, IRON, RETICCTPCT in the last 72 hours. Sepsis Labs: No results for input(s): PROCALCITON, LATICACIDVEN in the last 168 hours.  No results found for this or any previous visit (from the past 240 hour(s)).       Radiology Studies: No results found.      Scheduled Meds: . enoxaparin (LOVENOX) injection  40 mg Subcutaneous QHS  . folic acid  1 mg Oral Daily  . multivitamin with minerals  1 tablet Oral Daily  .  nicotine  14 mg Transdermal Daily  . thiamine  100 mg Oral Daily   Or  . thiamine  100 mg Intravenous Daily   Continuous Infusions:    LOS: 2 days    Time spent: 35 minutes.     Elmarie Shiley, MD [facility] Pager [phone]  If 7PM-7AM, please contact night-coverage [email or web address] Password [email or web address] 10/27/2018, 1:07 PM

## 2018-10-28 DIAGNOSIS — K852 Alcohol induced acute pancreatitis without necrosis or infection: Principal | ICD-10-CM

## 2018-10-28 DIAGNOSIS — M25461 Effusion, right knee: Secondary | ICD-10-CM

## 2018-10-28 LAB — BASIC METABOLIC PANEL
ANION GAP: 13 (ref 5–15)
BUN: 6 mg/dL (ref 6–20)
CALCIUM: 8.4 mg/dL — AB (ref 8.9–10.3)
CHLORIDE: 99 mmol/L (ref 98–111)
CO2: 19 mmol/L — AB (ref 22–32)
CREATININE: 0.46 mg/dL — AB (ref 0.61–1.24)
GFR calc non Af Amer: >60 mL/min (ref >60)
Glucose, Bld: 83 mg/dL (ref 70–99)
Potassium: 3 mmol/L — ABNORMAL LOW (ref 3.5–5.1)
Sodium: 131 mmol/L — ABNORMAL LOW (ref 135–145)

## 2018-10-28 LAB — GRAM STAIN: Special Requests: NORMAL

## 2018-10-28 LAB — SYNOVIAL CELL COUNT + DIFF, W/ CRYSTALS
Eosinophils-Synovial: 0 % (ref 0–1)
Lymphocytes-Synovial Fld: 5 % (ref 0–20)
Monocyte-Macrophage-Synovial Fluid: 8 % — ABNORMAL LOW (ref 50–90)
Neutrophil, Synovial: 87 % — ABNORMAL HIGH (ref 0–25)
WBC, SYNOVIAL: 16125 /mm3 — AB (ref 0–200)

## 2018-10-28 LAB — CBC
HEMATOCRIT: 33.4 % — AB (ref 39.0–52.0)
HEMOGLOBIN: 11.5 g/dL — AB (ref 13.0–17.0)
MCH: 38.6 pg — ABNORMAL HIGH (ref 26.0–34.0)
MCHC: 34.4 g/dL (ref 30.0–36.0)
MCV: 112.1 fL — ABNORMAL HIGH (ref 80.0–100.0)
NRBC: 0 % (ref 0.0–0.2)
Platelets: 180 10*3/uL (ref 150–400)
RBC: 2.98 MIL/uL — ABNORMAL LOW (ref 4.22–5.81)
RDW: 16.5 % — ABNORMAL HIGH (ref 11.5–15.5)
WBC: 8.5 10*3/uL (ref 4.0–10.5)

## 2018-10-28 LAB — URIC ACID: Uric Acid, Serum: 5.8 mg/dL (ref 3.7–8.6)

## 2018-10-28 MED ORDER — COLCHICINE 0.6 MG PO TABS: 0.6000 mg | ORAL_TABLET | Freq: Every day | ORAL | 0 refills | Status: DC

## 2018-10-28 MED ORDER — NICOTINE 14 MG/24HR TD PT24: 14.0000 mg | MEDICATED_PATCH | Freq: Every day | TRANSDERMAL | 0 refills | Status: DC

## 2018-10-28 MED ORDER — TRAMADOL HCL 50 MG PO TABS: 50.0000 mg | ORAL_TABLET | Freq: Four times a day (QID) | ORAL | 0 refills | Status: AC | PRN

## 2018-10-28 MED ORDER — ADULT MULTIVITAMIN W/MINERALS CH: 1.0000 | ORAL_TABLET | Freq: Every day | ORAL | 0 refills | Status: DC

## 2018-10-28 MED ORDER — POTASSIUM CHLORIDE CRYS ER 20 MEQ PO TBCR: 40.0000 meq | EXTENDED_RELEASE_TABLET | Freq: Every day | ORAL | 0 refills | Status: DC

## 2018-10-28 MED ORDER — POTASSIUM CHLORIDE CRYS ER 20 MEQ PO TBCR
40.0000 meq | EXTENDED_RELEASE_TABLET | ORAL | Status: AC
  Administered 2018-10-28 (×2): 40 meq via ORAL
  Filled 2018-10-28 (×2): qty 2

## 2018-10-28 MED ORDER — PREDNISONE 20 MG PO TABS: 40.0000 mg | ORAL_TABLET | Freq: Every day | ORAL | 0 refills | Status: DC

## 2018-10-28 MED ORDER — DICLOFENAC SODIUM 1 % TD GEL
2.0000 g | Freq: Four times a day (QID) | TRANSDERMAL | Status: DC
  Administered 2018-10-28: 2 g via TOPICAL
  Filled 2018-10-28: qty 100

## 2018-10-28 MED ORDER — COLCHICINE 0.6 MG PO TABS
0.6000 mg | ORAL_TABLET | Freq: Every day | ORAL | Status: DC
  Administered 2018-10-28: 0.6 mg via ORAL
  Filled 2018-10-28: qty 1

## 2018-10-28 MED ORDER — PREDNISONE 20 MG PO TABS: 40.0000 mg | ORAL_TABLET | Freq: Every day | ORAL | Status: DC

## 2018-10-28 NOTE — Progress Notes (Signed)
PROGRESS NOTE    DREON PINEDA  MPN:[id number] DOB: [birth year] DOA: 10/24/2018 PCP: Patient, No Pcp Per    Brief Narrative: Paul Allen is a 37 y.o. male with medical history significant of prior appendectomy presenting to the hospital for evaluation of abdominal pain.  Patient reports having abdominal pain since 11 AM today.  Pain is located in the left upper quadrant and radiates to the back.  It is constant and intermittently worse, "12 out of 10" in intensity.  Associated with chills.  No nausea or vomiting.  Patient reports drinking 4-6 beers daily; last drink was at 11:30 AM today.  ED Course: Afebrile and hemodynamically stable.  White count 11.1.  Lipase 187.  AST 109, ALT 48, alk phos 67, and T bili 1.4.  UA not suggestive of infection.  CT abdomen pelvis with evidence of acute pancreatitis; no necrosis.  Also slightly indistinct appearance of the second and third portion of the duodenum, possible duodenitis or reactive change from pancreatic inflammation.  Showing slightly thickened appearance of the sigmoid colon with mildly prominent mucosal enhancement, suggesting low-grade inflammatory process/colitis.  Patient received fentanyl, Dilaudid, and 2 L normal saline boluses in the ED.  [facility] paged to admit.    Assessment & Plan:   Principal Problem:   Acute alcoholic pancreatitis Active Problems:   Colitis   Hyponatremia   Hypokalemia   Macrocytosis   Alcohol abuse   Acute pancreatitis   1-Acute alcoholic pancreatitis.  Continue with IV fluids.  Change dilaudid to morphine due to nausea form dilaudid.  zofran for nausea PRN.  Improved, tolerating low fat diet.  Counseling provided.   2-Right knee pain, effusion.  Dr Ninfa Linden with ortho consulted for arthrocentesis.  Will order labs for synovial fluid.  Voltaren, apply ice.  check RF   3-Transaminases; from alcohol abuse.  Trending down.   4-Hypomagnesemia; received IV magnesium.  Resolved/.   5-? Low  grade colitis on CT scan. Monitor for symptoms.  Report loose stool. No significant diarrhea.  Stool culture negative.  He will benefit from colonoscopy, due to colitis, and frequents arthritis.   6-Alcohol abuse; CIWA.  Counseled provided.   Macrocytosis; related to alcohol.  Anemia; of acute illness.  Monitor hb.  Hemodilution ?  Repeat labs in am.   Elevated BP; PRN medication. Stop IV fluids.  Hypokalemia; replete orally   RN Pressure Injury Documentation:    Malnutrition Type:      Malnutrition Characteristics:      Nutrition Interventions:     Estimated body mass index is 25.4 kg/m as calculated from the following:   Height as of this encounter: '5\' 9"'  (1.753 m).   Weight as of this encounter: 78 kg.   DVT prophylaxis: Lovenox Code Status: full code.  Family Communication: care discussed with patient  Disposition Plan: remain inpatient for treatment of pancreatitis. Continue with NPO, IV fluids.   Consultants:   none   Procedures: none  Antimicrobials: none  Subjective: Abdominal pain is better.  Report right knee pain, swelling overnight. Denies trauma . Similar episode 5 months ago.     Objective: Vitals:   10/28/18 0000 10/28/18 0556 10/28/18 0848 10/28/18 1353  BP: (!) 143/82 (!) 155/98 (!) 160/85 (!) 156/89  Pulse: 61 67 (!) 59 67  Resp:  17  (!) 22  Temp:   98.2 F (36.8 C) 99.8 F (37.7 C)  TempSrc:    Oral  SpO2:  100% 100% 99%  Weight:  Height:        Intake/Output Summary (Last 24 hours) at 10/28/2018 1415 Last data filed at 10/28/2018 1000 Gross per 24 hour  Intake 1245 ml  Output 500 ml  Net 745 ml   Filed Weights   10/24/18 1611  Weight: 78 kg    Examination:  General exam: NAD Respiratory system: CTA Cardiovascular system; S 1, S 2 RRR Gastrointestinal system: BS present, soft, less tender.  Central nervous system: non focal.  Extremities: right knee with swelling , tender   Data Reviewed: I have  personally reviewed following labs and imaging studies  CBC: Recent Labs  Lab 10/24/18 1640 10/25/18 0507 10/26/18 0415 10/27/18 0407 10/28/18 0355  WBC 11.1* 7.9 8.2 7.0 8.5  HGB 14.5 12.8* 12.2* 10.9* 11.5*  HCT 41.3 37.2* 36.7* 31.8* 33.4*  MCV 110.7* 113.8* 118.0* 114.8* 112.1*  PLT 242 184 168 152 366   Basic Metabolic Panel: Recent Labs  Lab 10/24/18 1640 10/24/18 2200 10/25/18 0507 10/26/18 0415 10/27/18 0407 10/28/18 0355  NA 133*  --  135 136 134* 131*  K 3.1*  --  3.7 3.6 3.2* 3.0*  CL 96*  --  103 103 103 99  CO2 27  --  '24 26 23 ' 19*  GLUCOSE 115*  --  103* 109* 102* 83  BUN 6  --  <5* <5* <5* 6  CREATININE 0.53*  --  0.49* 0.49* 0.41* 0.46*  CALCIUM 8.7*  --  8.5* 8.3* 8.3* 8.4*  MG  --  1.6*  --  1.9  --   --    GFR: Estimated Creatinine Clearance: 125.2 mL/min (A) (by C-G formula based on SCr of 0.46 mg/dL (L)). Liver Function Tests: Recent Labs  Lab 10/24/18 1640 10/25/18 0507 10/26/18 0415  AST 109* 64* 47*  ALT 48* 36 27  ALKPHOS 67 63 66  BILITOT 1.4* 2.0* 0.8  PROT 7.1 6.2* 6.1*  ALBUMIN 4.5 3.9 3.6   Recent Labs  Lab 10/24/18 1640  LIPASE 187*   No results for input(s): AMMONIA in the last 168 hours. Coagulation Profile: No results for input(s): INR, PROTIME in the last 168 hours. Cardiac Enzymes: No results for input(s): CKTOTAL, CKMB, CKMBINDEX, TROPONINI in the last 168 hours. BNP (last 3 results) No results for input(s): PROBNP in the last 8760 hours. HbA1C: No results for input(s): HGBA1C in the last 72 hours. CBG: No results for input(s): GLUCAP in the last 168 hours. Lipid Profile: No results for input(s): CHOL, HDL, LDLCALC, TRIG, CHOLHDL, LDLDIRECT in the last 72 hours. Thyroid Function Tests: No results for input(s): TSH, T4TOTAL, FREET4, T3FREE, THYROIDAB in the last 72 hours. Anemia Panel: No results for input(s): VITAMINB12, FOLATE, FERRITIN, TIBC, IRON, RETICCTPCT in the last 72 hours. Sepsis Labs: No results  for input(s): PROCALCITON, LATICACIDVEN in the last 168 hours.  Recent Results (from the past 240 hour(s))  Gastrointestinal Panel by PCR , Stool     Status: None   Collection Time: 10/26/18  6:11 PM  Result Value Ref Range Status   Campylobacter species NOT DETECTED NOT DETECTED Final   Plesimonas shigelloides NOT DETECTED NOT DETECTED Final   Salmonella species NOT DETECTED NOT DETECTED Final   Yersinia enterocolitica NOT DETECTED NOT DETECTED Final   Vibrio species NOT DETECTED NOT DETECTED Final   Vibrio cholerae NOT DETECTED NOT DETECTED Final   Enteroaggregative E coli (EAEC) NOT DETECTED NOT DETECTED Final   Enteropathogenic E coli (EPEC) NOT DETECTED NOT DETECTED Final   Enterotoxigenic E  coli (ETEC) NOT DETECTED NOT DETECTED Final   Shiga like toxin producing E coli (STEC) NOT DETECTED NOT DETECTED Final   Shigella/Enteroinvasive E coli (EIEC) NOT DETECTED NOT DETECTED Final   Cryptosporidium NOT DETECTED NOT DETECTED Final   Cyclospora cayetanensis NOT DETECTED NOT DETECTED Final   Entamoeba histolytica NOT DETECTED NOT DETECTED Final   Giardia lamblia NOT DETECTED NOT DETECTED Final   Adenovirus F40/41 NOT DETECTED NOT DETECTED Final   Astrovirus NOT DETECTED NOT DETECTED Final   Norovirus GI/GII NOT DETECTED NOT DETECTED Final   Rotavirus A NOT DETECTED NOT DETECTED Final   Sapovirus (I, II, IV, and V) NOT DETECTED NOT DETECTED Final    Comment: Performed at [facility], [address], [place], [place] [address]         Radiology Studies: No results found.      Scheduled Meds: . enoxaparin (LOVENOX) injection  40 mg Subcutaneous QHS  . folic acid  1 mg Oral Daily  . multivitamin with minerals  1 tablet Oral Daily  . nicotine  14 mg Transdermal Daily  . potassium chloride  40 mEq Oral Q4H  . thiamine  100 mg Oral Daily   Or  . thiamine  100 mg Intravenous Daily   Continuous Infusions:    LOS: 3 days    Time spent: 35 minutes.      Elmarie Shiley, MD [facility] Pager [phone]  If 7PM-7AM, please contact night-coverage [email or web address] Password [email or web address] 10/28/2018, 2:15 PM

## 2018-10-28 NOTE — Progress Notes (Signed)
Patient ID: KYIAN OBST, male   DOB: 1981/08/04, 37 y.o.   MRN: [id number] The synovial aspirate from his right knee shows crystals significant for gout.  It did physically look like gout to me as well.  Given his alcohol history, it fits with gout.  Although there were no crystals seen in his knee aspiration in May of this year, his uric acid level was slightly high.  He does need to be treated for gout.  Could even have a steroid taper if ok from a medical standpoint.  When I see him as an outpatient, I can place a steroid injection in his knee.

## 2018-10-28 NOTE — Consult Note (Signed)
Reason for Consult:  Right knee effusion Referring Physician:  Tyrell Antonio, MD  Paul Allen is an 37 y.o. male.  HPI: The patient is a 37 year old gentleman who we were asked to consult on to evaluate a right knee effusion.  He is in the hospital for I believe pancreatitis.  He had a similar fusion develop on his right knee in May of this year.  An aspiration in the emergency room showed 24,000 white cells but no crystals.  There was also no organisms.  His uric acid level then was 8.0.  He has been in the hospital since around Wednesday.  He developed right knee swelling over the last 24 hours.  He does report pain with the swelling.  He denies any injuries.  History reviewed. No pertinent past medical history.  Past Surgical History:  Procedure Laterality Date  . APPENDECTOMY      No family history on file.  Social History:  reports that he has been smoking cigarettes. He has been smoking about 1.50 packs per day. He has never used smokeless tobacco. He reports that he drinks about 5.0 standard drinks of alcohol per week. He reports that he has current or past drug history. Drug: Marijuana.  Allergies:  Allergies  Allergen Reactions  . Penicillins Rash    Has patient had a PCN reaction causing immediate rash, facial/tongue/throat swelling, SOB or lightheadedness with hypotension: yes Has patient had a PCN reaction causing severe rash involving mucus membranes or skin necrosis: Yes Has patient had a PCN reaction that required hospitalization: No Has patient had a PCN reaction occurring within the last 10 years: No If all of the above answers are "NO", then may proceed with Cephalosporin use.     Medications: I have reviewed the patient's current medications.  Results for orders placed or performed during the hospital encounter of 10/24/18 (from the past 48 hour(s))  Gastrointestinal Panel by PCR , Stool     Status: None   Collection Time: 10/26/18  6:11 PM  Result Value Ref Range    Campylobacter species NOT DETECTED NOT DETECTED   Plesimonas shigelloides NOT DETECTED NOT DETECTED   Salmonella species NOT DETECTED NOT DETECTED   Yersinia enterocolitica NOT DETECTED NOT DETECTED   Vibrio species NOT DETECTED NOT DETECTED   Vibrio cholerae NOT DETECTED NOT DETECTED   Enteroaggregative E coli (EAEC) NOT DETECTED NOT DETECTED   Enteropathogenic E coli (EPEC) NOT DETECTED NOT DETECTED   Enterotoxigenic E coli (ETEC) NOT DETECTED NOT DETECTED   Shiga like toxin producing E coli (STEC) NOT DETECTED NOT DETECTED   Shigella/Enteroinvasive E coli (EIEC) NOT DETECTED NOT DETECTED   Cryptosporidium NOT DETECTED NOT DETECTED   Cyclospora cayetanensis NOT DETECTED NOT DETECTED   Entamoeba histolytica NOT DETECTED NOT DETECTED   Giardia lamblia NOT DETECTED NOT DETECTED   Adenovirus F40/41 NOT DETECTED NOT DETECTED   Astrovirus NOT DETECTED NOT DETECTED   Norovirus GI/GII NOT DETECTED NOT DETECTED   Rotavirus A NOT DETECTED NOT DETECTED   Sapovirus (I, II, IV, and V) NOT DETECTED NOT DETECTED    Comment: Performed at [facility], [address] [place], [place] [address]  CBC     Status: Abnormal   Collection Time: 10/27/18  4:07 AM  Result Value Ref Range   WBC 7.0 4.0 - 10.5 K/uL   RBC 2.77 (L) 4.22 - 5.81 MIL/uL   Hemoglobin 10.9 (L) 13.0 - 17.0 g/dL   HCT 31.8 (L) 39.0 - 52.0 %  MCV 114.8 (H) 80.0 - 100.0 fL   MCH 39.4 (H) 26.0 - 34.0 pg   MCHC 34.3 30.0 - 36.0 g/dL   RDW 16.6 (H) 11.5 - 15.5 %   Platelets 152 150 - 400 K/uL   nRBC 0.0 0.0 - 0.2 %    Comment: Performed at [facility], [place] [address] [place], [place] [address]  Basic metabolic panel     Status: Abnormal   Collection Time: 10/27/18  4:07 AM  Result Value Ref Range   Sodium 134 (L) 135 - 145 mmol/L   Potassium 3.2 (L) 3.5 - 5.1 mmol/L   Chloride 103 98 - 111 mmol/L   CO2 23 22 - 32 mmol/L   Glucose, Bld 102 (H) 70 - 99 mg/dL   BUN <5 (L) 6 - 20 mg/dL    Creatinine, Ser 0.41 (L) 0.61 - 1.24 mg/dL   Calcium 8.3 (L) 8.9 - 10.3 mg/dL   GFR calc non Af Amer >60 >60 mL/min   GFR calc Af Amer >60 >60 mL/min    Comment: (NOTE) The eGFR has been calculated using the CKD EPI equation. This calculation has not been validated in all clinical situations. eGFR's persistently <60 mL/min signify possible Chronic Kidney Disease.    Anion gap 8 5 - 15    Comment: Performed at [facility], [place] [address] [place], [place] [address]  CBC     Status: Abnormal   Collection Time: 10/28/18  3:55 AM  Result Value Ref Range   WBC 8.5 4.0 - 10.5 K/uL   RBC 2.98 (L) 4.22 - 5.81 MIL/uL   Hemoglobin 11.5 (L) 13.0 - 17.0 g/dL   HCT 33.4 (L) 39.0 - 52.0 %   MCV 112.1 (H) 80.0 - 100.0 fL   MCH 38.6 (H) 26.0 - 34.0 pg   MCHC 34.4 30.0 - 36.0 g/dL   RDW 16.5 (H) 11.5 - 15.5 %   Platelets 180 150 - 400 K/uL   nRBC 0.0 0.0 - 0.2 %    Comment: Performed at [facility], [place] [address], [place], [place] [address]  Basic metabolic panel     Status: Abnormal   Collection Time: 10/28/18  3:55 AM  Result Value Ref Range   Sodium 131 (L) 135 - 145 mmol/L   Potassium 3.0 (L) 3.5 - 5.1 mmol/L   Chloride 99 98 - 111 mmol/L   CO2 19 (L) 22 - 32 mmol/L   Glucose, Bld 83 70 - 99 mg/dL   BUN 6 6 - 20 mg/dL   Creatinine, Ser 0.46 (L) 0.61 - 1.24 mg/dL   Calcium 8.4 (L) 8.9 - 10.3 mg/dL   GFR calc non Af Amer >60 >60 mL/min   GFR calc Af Amer >60 >60 mL/min    Comment: (NOTE) The eGFR has been calculated using the CKD EPI equation. This calculation has not been validated in all clinical situations. eGFR's persistently <60 mL/min signify possible Chronic Kidney Disease.    Anion gap 13 5 - 15    Comment: Performed at [facility], [place] [address], [place],  [address]    No results found.  ROS Blood pressure (!) 156/89, pulse 67, temperature 99.8 F (37.7 C), temperature source Oral, resp. rate (!) 22,  height '5\' 9"'  (1.753 m), weight 78 kg, SpO2 99 %. Physical Exam  Constitutional: He is oriented to person, place, and time. He appears well-developed and well-nourished.  HENT:  Head: Normocephalic and atraumatic.  Neck: Neck supple.  Cardiovascular: Normal rate.  Respiratory: Effort normal.  GI: Soft.  Musculoskeletal:       Right knee: He exhibits effusion.  Neurological: He is alert and oriented to person, place, and time.  Skin: Skin is warm and dry.  Psychiatric: He has a normal mood and affect.   His right knee does show knee effusion.  There is no redness.  His range of motion is limited by pain and the effusion itself.  The knee feels ligamentously stable.  Assessment/Plan: Recurrent effusion right knee  I was able to aspirate 60 cc of straw-colored fluid from his right knee.  It seems more consistent with gout than any type of infection.  I did order a new uric acid level and sent off this fluid for cell count and crystal analysis.  From my standpoint, he can be seen as an outpatient in the next 1 to 2 weeks.  All question concerns were answered and addressed I did give him my card.  Mcarthur Rossetti 10/28/2018, 2:21 PM

## 2018-10-28 NOTE — Progress Notes (Signed)
Pt irritable and requesting that no one come in his room until the "sun comes up or at least 9".  VSS

## 2018-10-28 NOTE — Discharge Summary (Signed)
Physician Discharge Summary  JACORIE ERNSBERGER UEA:[id number] DOB: Mar 29, 1981 DOA: 10/24/2018  PCP: Patient, No Pcp Per  Admit date: 10/24/2018 Discharge date: 10/28/2018  Admitted From: Home  Disposition Home  Recommendations for Outpatient Follow-up:  1. Follow up with PCP in 1-2 weeks 2. Please obtain BMP/CBC in one week 3. Follow synovial culture results.  4. Follow RF  5. Need evaluation for High BP  Home Health: no  Discharge Condition: stable.  CODE STATUS: full code.  Diet recommendation: Heart Healthy  Brief/Interim Summary: Brief Narrative: Cordarro Spinnato Harrymanis a 37 y.o.malewith medical history significant ofprior appendectomy presenting to the hospital for evaluation of abdominal pain.Patient reports having abdominal pain since 11 AM today. Pain is located in the left upper quadrant and radiates to the back. It is constant and intermittently worse, "12 out of 10"in intensity. Associated with chills. No nausea or vomiting. Patient reports drinking 4-6 beers daily; last drink was at 11:30 AM today.  ED Course:Afebrile and hemodynamically stable. White count 11.1. Lipase 187. AST 109, ALT 48, alk phos 67, and T bili 1.4. UA not suggestive of infection. CT abdomen pelvis with evidence of acute pancreatitis;no necrosis. Also slightly indistinct appearance of the second and third portion of the duodenum, possible duodenitis or reactive change from pancreatic inflammation. Showing slightly thickened appearance of the sigmoid colon with mildly prominent mucosal enhancement, suggesting low-grade inflammatory process/colitis. Patient received fentanyl, Dilaudid, and 2 L normal saline boluses in the ED. [facility] paged to admit.    Assessment & Plan:   Principal Problem:   Acute alcoholic pancreatitis Active Problems:   Colitis   Hyponatremia   Hypokalemia   Macrocytosis   Alcohol abuse   Acute pancreatitis   1-Acute alcoholic pancreatitis.  Continue with  IV fluids.  Change dilaudid to morphine due to nausea form dilaudid.  zofran for nausea PRN.  Improved, tolerating low fat diet.  Counseling provided.   2-Right knee pain, effusion.  Dr Ninfa Linden with ortho consulted for arthrocentesis.  Synovial fluid; with monosodium urate crystal. Culture pending. Discharge on prednisone and colchicine.  Voltaren, apply ice.  check RF   3-Transaminases; from alcohol abuse.  Trending down.   4-Hypomagnesemia; received IV magnesium.  Resolved/.   5-? Low grade colitis on CT scan. Monitor for symptoms.  Report loose stool. No significant diarrhea.  Stool culture negative.  He will benefit from colonoscopy, due to colitis, and frequents arthritis.   6-Alcohol abuse; CIWA.  Counseled provided.   Macrocytosis; related to alcohol.  Anemia; of acute illness.  Monitor hb.  Hemodilution ?  Repeat labs in am.  stable.   Elevated BP; PRN medication. Stop IV fluids.  Diet , needs follow up with PCP.   Hypokalemia; replete orally   RN Pressure Injury Documentation:  Malnutrition Type:    Malnutrition Characteristics:    Nutrition Interventions:   Estimated body mass index is 25.4 kg/m as calculated from the following:   Height as of this encounter: '5\' 9"'  (1.753 m).   Weight as of this encounter: 78 kg.  Discharge Diagnoses:  Principal Problem:   Acute alcoholic pancreatitis Active Problems:   Colitis   Hyponatremia   Hypokalemia   Macrocytosis   Alcohol abuse   Acute pancreatitis   Effusion, right knee    Discharge Instructions  Discharge Instructions    Diet - low sodium heart healthy   Complete by:  As directed    Increase activity slowly   Complete by:  As directed  Allergies as of 10/28/2018      Reactions   Penicillins Rash   Has patient had a PCN reaction causing immediate rash, facial/tongue/throat swelling, SOB or lightheadedness with hypotension: yes Has patient had a PCN reaction  causing severe rash involving mucus membranes or skin necrosis: Yes Has patient had a PCN reaction that required hospitalization: No Has patient had a PCN reaction occurring within the last 10 years: No If all of the above answers are "NO", then may proceed with Cephalosporin use.      Medication List    STOP taking these medications   acetaminophen 500 MG tablet Commonly known as:  TYLENOL   ibuprofen 200 MG tablet Commonly known as:  ADVIL,MOTRIN   ibuprofen 800 MG tablet Commonly known as:  ADVIL,MOTRIN   methylPREDNISolone 4 MG Tbpk tablet Commonly known as:  MEDROL DOSEPAK     TAKE these medications   colchicine 0.6 MG tablet Take 1 tablet (0.6 mg total) by mouth daily.   multivitamin with minerals Tabs tablet Take 1 tablet by mouth daily.   nicotine 14 mg/24hr patch Commonly known as:  NICODERM CQ - dosed in mg/24 hours Place 1 patch (14 mg total) onto the skin daily.   potassium chloride SA 20 MEQ tablet Commonly known as:  K-DUR,KLOR-CON Take 2 tablets (40 mEq total) by mouth daily.   predniSONE 20 MG tablet Commonly known as:  DELTASONE Take 2 tablets (40 mg total) by mouth daily.   traMADol 50 MG tablet Commonly known as:  ULTRAM Take 1 tablet (50 mg total) by mouth every 6 (six) hours as needed for up to 5 days for moderate pain.      Follow-up Information    Mcarthur Rossetti, MD. Schedule an appointment as soon as possible for a visit in 2 week(s).   Specialty:  Orthopedic Surgery Contact information: [address] [place] [address] [phone]          Allergies  Allergen Reactions  . Penicillins Rash    Has patient had a PCN reaction causing immediate rash, facial/tongue/throat swelling, SOB or lightheadedness with hypotension: yes Has patient had a PCN reaction causing severe rash involving mucus membranes or skin necrosis: Yes Has patient had a PCN reaction that required hospitalization: No Has patient had a PCN  reaction occurring within the last 10 years: No If all of the above answers are "NO", then may proceed with Cephalosporin use.     Consultations: DR Ninfa Linden.   Procedures/Studies: Ct Abdomen Pelvis W Contrast  Result Date: 10/24/2018 CLINICAL DATA:  Abdominal pain EXAM: CT ABDOMEN AND PELVIS WITH CONTRAST TECHNIQUE: Multidetector CT imaging of the abdomen and pelvis was performed using the standard protocol following bolus administration of intravenous contrast. CONTRAST:  170m ISOVUE-300 IOPAMIDOL (ISOVUE-300) INJECTION 61% COMPARISON:  CT 01/13/2008 FINDINGS: Lower chest: Lung bases demonstrate no acute consolidation or effusion. The heart size is within normal limits. Hepatobiliary: Heterogeneous hypodensity near the gallbladder fossa and within the inferior right hepatic lobe. No calcified gallstone. No biliary dilatation Pancreas: Indistinct with surrounding edema and fluid in the left anterior pararenal space suspect for pancreatitis. No necrosis. No organized fluid collection. Spleen: Normal in size without focal abnormality. Adrenals/Urinary Tract: Adrenal glands are unremarkable. Kidneys are normal, without renal calculi, focal lesion, or hydronephrosis. Bladder is unremarkable. Stomach/Bowel: Stomach is within normal limits. Duodenum is also slightly indistinct. Status post appendectomy. Prominent submucosal fat deposition in the right colon consistent with chronic inflammatory process. Slight wall thickening and mucosal  enhancement involving the sigmoid colon. Vascular/Lymphatic: Nonaneurysmal aorta. Minimal aortic atherosclerosis. No significantly enlarged lymph nodes Reproductive: Prostate is unremarkable. Other: No free air.  Trace free fluid in the pelvis and abdomen Musculoskeletal: No acute or significant osseous findings. IMPRESSION: 1. Indistinct appearance of the pancreas with surrounding inflammation and small fluid consistent with acute pancreatitis. No organized fluid collection.  No necrosis. 2. Slightly indistinct appearance of the second and third portion of duodenum, possible duodenitis or reactive change from pancreatic inflammation 3. Slightly thickened appearance of the sigmoid colon with mildly prominent mucosal enhancement, suggesting low-grade inflammatory process/colitis 4. Heterogenous hypodensity within the right lobe of liver and adjacent to the gallbladder fossa, may reflect heterogenous fat infiltration of the liver. Electronically Signed   By: Donavan Foil M.D.   On: 10/24/2018 21:11      Subjective:  had arthrocentesis for right knee pain, wishes to go home  Discharge Exam: Vitals:   10/28/18 0848 10/28/18 1353  BP: (!) 160/85 (!) 156/89  Pulse: (!) 59 67  Resp:  (!) 22  Temp: 98.2 F (36.8 C) 99.8 F (37.7 C)  SpO2: 100% 99%   Vitals:   10/28/18 0000 10/28/18 0556 10/28/18 0848 10/28/18 1353  BP: (!) 143/82 (!) 155/98 (!) 160/85 (!) 156/89  Pulse: 61 67 (!) 59 67  Resp:  17  (!) 22  Temp:   98.2 F (36.8 C) 99.8 F (37.7 C)  TempSrc:    Oral  SpO2:  100% 100% 99%  Weight:      Height:        General: Pt is alert, awake, not in acute distress Cardiovascular: RRR, S1/S2 +, no rubs, no gallops Respiratory: CTA bilaterally, no wheezing, no rhonchi Abdominal: Soft, NT, ND, bowel sounds + Extremities: no edema, no cyanosis    The results of significant diagnostics from this hospitalization (including imaging, microbiology, ancillary and laboratory) are listed below for reference.     Microbiology: Recent Results (from the past 240 hour(s))  Gastrointestinal Panel by PCR , Stool     Status: None   Collection Time: 10/26/18  6:11 PM  Result Value Ref Range Status   Campylobacter species NOT DETECTED NOT DETECTED Final   Plesimonas shigelloides NOT DETECTED NOT DETECTED Final   Salmonella species NOT DETECTED NOT DETECTED Final   Yersinia enterocolitica NOT DETECTED NOT DETECTED Final   Vibrio species NOT DETECTED NOT DETECTED  Final   Vibrio cholerae NOT DETECTED NOT DETECTED Final   Enteroaggregative E coli (EAEC) NOT DETECTED NOT DETECTED Final   Enteropathogenic E coli (EPEC) NOT DETECTED NOT DETECTED Final   Enterotoxigenic E coli (ETEC) NOT DETECTED NOT DETECTED Final   Shiga like toxin producing E coli (STEC) NOT DETECTED NOT DETECTED Final   Shigella/Enteroinvasive E coli (EIEC) NOT DETECTED NOT DETECTED Final   Cryptosporidium NOT DETECTED NOT DETECTED Final   Cyclospora cayetanensis NOT DETECTED NOT DETECTED Final   Entamoeba histolytica NOT DETECTED NOT DETECTED Final   Giardia lamblia NOT DETECTED NOT DETECTED Final   Adenovirus F40/41 NOT DETECTED NOT DETECTED Final   Astrovirus NOT DETECTED NOT DETECTED Final   Norovirus GI/GII NOT DETECTED NOT DETECTED Final   Rotavirus A NOT DETECTED NOT DETECTED Final   Sapovirus (I, II, IV, and V) NOT DETECTED NOT DETECTED Final    Comment: Performed at [facility], [address] [place], [place] [address]  Gram stain     Status: None (Preliminary result)   Collection Time: 10/28/18  2:15 PM  Result Value Ref Range Status   Specimen Description KNEE RIGHT  Final   Special Requests Normal  Final   Gram Stain   Final    ABUNDANT WBC PRESENT, PREDOMINANTLY MONONUCLEAR FEW WBC PRESENT, PREDOMINANTLY PMN NO ORGANISMS SEEN CALLED J.Rance Muir [phone] '@[id number]'  BY V.WILKINS Performed at [facility] [address], [place], [place] [address]    Report Status PENDING  Incomplete     Labs: BNP (last 3 results) No results for input(s): BNP in the last 8760 hours. Basic Metabolic Panel: Recent Labs  Lab 10/24/18 1640 10/24/18 2200 10/25/18 0507 10/26/18 0415 10/27/18 0407 10/28/18 0355  NA 133*  --  135 136 134* 131*  K 3.1*  --  3.7 3.6 3.2* 3.0*  CL 96*  --  103 103 103 99  CO2 27  --  '24 26 23 ' 19*  GLUCOSE 115*  --  103* 109* 102* 83  BUN 6  --  <5* <5* <5* 6  CREATININE 0.53*  --  0.49* 0.49* 0.41* 0.46*  CALCIUM 8.7*   --  8.5* 8.3* 8.3* 8.4*  MG  --  1.6*  --  1.9  --   --    Liver Function Tests: Recent Labs  Lab 10/24/18 1640 10/25/18 0507 10/26/18 0415  AST 109* 64* 47*  ALT 48* 36 27  ALKPHOS 67 63 66  BILITOT 1.4* 2.0* 0.8  PROT 7.1 6.2* 6.1*  ALBUMIN 4.5 3.9 3.6   Recent Labs  Lab 10/24/18 1640  LIPASE 187*   No results for input(s): AMMONIA in the last 168 hours. CBC: Recent Labs  Lab 10/24/18 1640 10/25/18 0507 10/26/18 0415 10/27/18 0407 10/28/18 0355  WBC 11.1* 7.9 8.2 7.0 8.5  HGB 14.5 12.8* 12.2* 10.9* 11.5*  HCT 41.3 37.2* 36.7* 31.8* 33.4*  MCV 110.7* 113.8* 118.0* 114.8* 112.1*  PLT 242 184 168 152 180   Cardiac Enzymes: No results for input(s): CKTOTAL, CKMB, CKMBINDEX, TROPONINI in the last 168 hours. BNP: Invalid input(s): POCBNP CBG: No results for input(s): GLUCAP in the last 168 hours. D-Dimer No results for input(s): DDIMER in the last 72 hours. Hgb A1c No results for input(s): HGBA1C in the last 72 hours. Lipid Profile No results for input(s): CHOL, HDL, LDLCALC, TRIG, CHOLHDL, LDLDIRECT in the last 72 hours. Thyroid function studies No results for input(s): TSH, T4TOTAL, T3FREE, THYROIDAB in the last 72 hours.  Invalid input(s): FREET3 Anemia work up No results for input(s): VITAMINB12, FOLATE, FERRITIN, TIBC, IRON, RETICCTPCT in the last 72 hours. Urinalysis    Component Value Date/Time   COLORURINE YELLOW 10/24/2018 1640   APPEARANCEUR TURBID (A) 10/24/2018 1640   LABSPEC 1.021 10/24/2018 1640   PHURINE 5.0 10/24/2018 1640   GLUCOSEU NEGATIVE 10/24/2018 1640   HGBUR NEGATIVE 10/24/2018 1640   BILIRUBINUR NEGATIVE 10/24/2018 1640   KETONESUR 5 (A) 10/24/2018 1640   PROTEINUR NEGATIVE 10/24/2018 1640   UROBILINOGEN 1.0 01/13/2008 2059   NITRITE NEGATIVE 10/24/2018 1640   LEUKOCYTESUR NEGATIVE 10/24/2018 1640   Sepsis Labs Invalid input(s): PROCALCITONIN,  WBC,  LACTICIDVEN Microbiology Recent Results (from the past 240 hour(s))   Gastrointestinal Panel by PCR , Stool     Status: None   Collection Time: 10/26/18  6:11 PM  Result Value Ref Range Status   Campylobacter species NOT DETECTED NOT DETECTED Final   Plesimonas shigelloides NOT DETECTED NOT DETECTED Final   Salmonella species NOT DETECTED NOT DETECTED Final   Yersinia enterocolitica NOT DETECTED NOT DETECTED Final   Vibrio  species NOT DETECTED NOT DETECTED Final   Vibrio cholerae NOT DETECTED NOT DETECTED Final   Enteroaggregative E coli (EAEC) NOT DETECTED NOT DETECTED Final   Enteropathogenic E coli (EPEC) NOT DETECTED NOT DETECTED Final   Enterotoxigenic E coli (ETEC) NOT DETECTED NOT DETECTED Final   Shiga like toxin producing E coli (STEC) NOT DETECTED NOT DETECTED Final   Shigella/Enteroinvasive E coli (EIEC) NOT DETECTED NOT DETECTED Final   Cryptosporidium NOT DETECTED NOT DETECTED Final   Cyclospora cayetanensis NOT DETECTED NOT DETECTED Final   Entamoeba histolytica NOT DETECTED NOT DETECTED Final   Giardia lamblia NOT DETECTED NOT DETECTED Final   Adenovirus F40/41 NOT DETECTED NOT DETECTED Final   Astrovirus NOT DETECTED NOT DETECTED Final   Norovirus GI/GII NOT DETECTED NOT DETECTED Final   Rotavirus A NOT DETECTED NOT DETECTED Final   Sapovirus (I, II, IV, and V) NOT DETECTED NOT DETECTED Final    Comment: Performed at [facility], [address] [place], [place] [address]  Gram stain     Status: None (Preliminary result)   Collection Time: 10/28/18  2:15 PM  Result Value Ref Range Status   Specimen Description KNEE RIGHT  Final   Special Requests Normal  Final   Gram Stain   Final    ABUNDANT WBC PRESENT, PREDOMINANTLY MONONUCLEAR FEW WBC PRESENT, PREDOMINANTLY PMN NO ORGANISMS SEEN CALLED J.Rance Muir [phone] '@[phone]'  BY V.WILKINS Performed at [facility] [address], [place], [place] [address]    Report Status PENDING  Incomplete     Time coordinating discharge: 35 minutes.   SIGNED:   Elmarie Shiley, MD  [facility] 10/28/2018, 5:45 PM Pager   If 7PM-7AM, please contact night-coverage [email or web address] Password [email or web address]

## 2018-10-29 LAB — RHEUMATOID FACTOR

## 2018-11-01 LAB — BODY FLUID CULTURE
Culture: NO GROWTH
Special Requests: NORMAL

## 2018-11-02 LAB — ANAEROBIC CULTURE: Special Requests: NORMAL

## 2018-11-02 LAB — GONOCOCCUS CULTURE
CULTURE: NO GROWTH
Special Requests: NORMAL

## 2019-07-04 ENCOUNTER — Emergency Department (HOSPITAL_COMMUNITY): Payer: Self-pay

## 2019-07-04 ENCOUNTER — Inpatient Hospital Stay (HOSPITAL_COMMUNITY): Payer: Self-pay

## 2019-07-04 ENCOUNTER — Inpatient Hospital Stay (HOSPITAL_COMMUNITY)
Admission: EM | Admit: 2019-07-04 | Discharge: 2019-07-08 | DRG: 440 | Disposition: A | Discharge status: Home / Self Care | Payer: Self-pay | Attending: Internal Medicine | Admitting: Internal Medicine

## 2019-07-04 ENCOUNTER — Encounter (HOSPITAL_COMMUNITY): Payer: Self-pay | Admitting: Emergency Medicine

## 2019-07-04 ENCOUNTER — Other Ambulatory Visit: Payer: Self-pay

## 2019-07-04 DIAGNOSIS — Z720 Tobacco use: Secondary | ICD-10-CM

## 2019-07-04 DIAGNOSIS — F1010 Alcohol abuse, uncomplicated: Secondary | ICD-10-CM | POA: Diagnosis present

## 2019-07-04 DIAGNOSIS — K852 Alcohol induced acute pancreatitis without necrosis or infection: Principal | ICD-10-CM | POA: Diagnosis present

## 2019-07-04 DIAGNOSIS — K859 Acute pancreatitis without necrosis or infection, unspecified: Secondary | ICD-10-CM | POA: Diagnosis present

## 2019-07-04 DIAGNOSIS — Z1159 Encounter for screening for other viral diseases: Secondary | ICD-10-CM

## 2019-07-04 DIAGNOSIS — F17210 Nicotine dependence, cigarettes, uncomplicated: Secondary | ICD-10-CM | POA: Diagnosis present

## 2019-07-04 DIAGNOSIS — D72829 Elevated white blood cell count, unspecified: Secondary | ICD-10-CM

## 2019-07-04 DIAGNOSIS — Z9049 Acquired absence of other specified parts of digestive tract: Secondary | ICD-10-CM

## 2019-07-04 DIAGNOSIS — E876 Hypokalemia: Secondary | ICD-10-CM | POA: Diagnosis present

## 2019-07-04 HISTORY — DX: Acute pancreatitis without necrosis or infection, unspecified: K85.90

## 2019-07-04 LAB — COMPREHENSIVE METABOLIC PANEL
ALT: 58 U/L — ABNORMAL HIGH (ref 0–44)
AST: 98 U/L — ABNORMAL HIGH (ref 15–41)
Albumin: 4.8 g/dL (ref 3.5–5.0)
Alkaline Phosphatase: 63 U/L (ref 38–126)
Anion gap: 20 — ABNORMAL HIGH (ref 5–15)
BUN: <5 mg/dL — ABNORMAL LOW (ref 6–20)
CO2: 21 mmol/L — ABNORMAL LOW (ref 22–32)
Calcium: 9.4 mg/dL (ref 8.9–10.3)
Chloride: 96 mmol/L — ABNORMAL LOW (ref 98–111)
Creatinine, Ser: 0.73 mg/dL (ref 0.61–1.24)
GFR calc Af Amer: >60 mL/min (ref >60)
GFR calc non Af Amer: >60 mL/min (ref >60)
Glucose, Bld: 173 mg/dL — ABNORMAL HIGH (ref 70–99)
Potassium: 3.6 mmol/L (ref 3.5–5.1)
Sodium: 137 mmol/L (ref 135–145)
Total Bilirubin: 0.9 mg/dL (ref 0.3–1.2)
Total Protein: 8.3 g/dL — ABNORMAL HIGH (ref 6.5–8.1)

## 2019-07-04 LAB — URINALYSIS, ROUTINE W REFLEX MICROSCOPIC
Bilirubin Urine: NEGATIVE
Glucose, UA: NEGATIVE mg/dL
Hgb urine dipstick: NEGATIVE
Ketones, ur: 20 mg/dL — AB
Leukocytes,Ua: NEGATIVE
Nitrite: NEGATIVE
Protein, ur: NEGATIVE mg/dL
Specific Gravity, Urine: 1.041 — ABNORMAL HIGH (ref 1.005–1.030)
pH: 5 (ref 5.0–8.0)

## 2019-07-04 LAB — SARS CORONAVIRUS 2 BY RT PCR (HOSPITAL ORDER, PERFORMED IN ~~LOC~~ HOSPITAL LAB): SARS Coronavirus 2: NEGATIVE

## 2019-07-04 LAB — CBC WITH DIFFERENTIAL/PLATELET
Abs Immature Granulocytes: 0.08 10*3/uL — ABNORMAL HIGH (ref 0.00–0.07)
Basophils Absolute: 0.1 10*3/uL (ref 0.0–0.1)
Basophils Relative: 1 %
Eosinophils Absolute: 0 10*3/uL (ref 0.0–0.5)
Eosinophils Relative: 0 %
HCT: 53.9 % — ABNORMAL HIGH (ref 39.0–52.0)
Hemoglobin: 18.3 g/dL — ABNORMAL HIGH (ref 13.0–17.0)
Immature Granulocytes: 1 %
Lymphocytes Relative: 10 %
Lymphs Abs: 1.7 10*3/uL (ref 0.7–4.0)
MCH: 33.2 pg (ref 26.0–34.0)
MCHC: 34 g/dL (ref 30.0–36.0)
MCV: 97.6 fL (ref 80.0–100.0)
Monocytes Absolute: 0.6 10*3/uL (ref 0.1–1.0)
Monocytes Relative: 4 %
Neutro Abs: 13.4 10*3/uL — ABNORMAL HIGH (ref 1.7–7.7)
Neutrophils Relative %: 84 %
Platelets: 196 10*3/uL (ref 150–400)
RBC: 5.52 MIL/uL (ref 4.22–5.81)
RDW: 13.5 % (ref 11.5–15.5)
WBC: 15.9 10*3/uL — ABNORMAL HIGH (ref 4.0–10.5)
nRBC: 0 % (ref 0.0–0.2)

## 2019-07-04 LAB — ETHANOL: Alcohol, Ethyl (B): <10 mg/dL (ref <10)

## 2019-07-04 LAB — LIPASE, BLOOD: Lipase: 819 U/L — ABNORMAL HIGH (ref 11–51)

## 2019-07-04 MED ORDER — IOHEXOL 300 MG/ML  SOLN
100.0000 mL | Freq: Once | INTRAMUSCULAR | Status: AC | PRN
  Administered 2019-07-04: 13:00:00 100 mL via INTRAVENOUS

## 2019-07-04 MED ORDER — ONDANSETRON HCL 4 MG PO TABS
4.0000 mg | ORAL_TABLET | Freq: Four times a day (QID) | ORAL | Status: DC | PRN
  Administered 2019-07-05: 4 mg via ORAL
  Filled 2019-07-04: qty 1

## 2019-07-04 MED ORDER — FOLIC ACID 1 MG PO TABS
1.0000 mg | ORAL_TABLET | Freq: Every day | ORAL | Status: DC
  Administered 2019-07-05 – 2019-07-08 (×4): 1 mg via ORAL
  Filled 2019-07-04 (×5): qty 1

## 2019-07-04 MED ORDER — NICOTINE 21 MG/24HR TD PT24
21.0000 mg | MEDICATED_PATCH | Freq: Every day | TRANSDERMAL | Status: DC
  Administered 2019-07-04 – 2019-07-08 (×5): 21 mg via TRANSDERMAL
  Filled 2019-07-04 (×6): qty 1

## 2019-07-04 MED ORDER — THIAMINE HCL 100 MG/ML IJ SOLN
100.0000 mg | Freq: Every day | INTRAMUSCULAR | Status: DC
  Administered 2019-07-04: 100 mg via INTRAVENOUS
  Filled 2019-07-04 (×3): qty 2

## 2019-07-04 MED ORDER — LORAZEPAM 1 MG PO TABS
1.0000 mg | ORAL_TABLET | Freq: Four times a day (QID) | ORAL | Status: AC | PRN
  Administered 2019-07-06: 1 mg via ORAL
  Filled 2019-07-04: qty 1

## 2019-07-04 MED ORDER — SODIUM CHLORIDE 0.9 % IV BOLUS
1000.0000 mL | Freq: Once | INTRAVENOUS | Status: AC
  Administered 2019-07-04: 1000 mL via INTRAVENOUS

## 2019-07-04 MED ORDER — VITAMIN B-1 100 MG PO TABS
100.0000 mg | ORAL_TABLET | Freq: Every day | ORAL | Status: DC
  Administered 2019-07-05 – 2019-07-08 (×4): 100 mg via ORAL
  Filled 2019-07-04 (×5): qty 1

## 2019-07-04 MED ORDER — OXYCODONE HCL 5 MG PO TABS
5.0000 mg | ORAL_TABLET | ORAL | Status: DC | PRN
  Administered 2019-07-05 – 2019-07-08 (×6): 5 mg via ORAL
  Filled 2019-07-04 (×7): qty 1

## 2019-07-04 MED ORDER — HYDROMORPHONE HCL 1 MG/ML IJ SOLN
1.0000 mg | Freq: Once | INTRAMUSCULAR | Status: AC
  Administered 2019-07-04: 1 mg via INTRAVENOUS
  Filled 2019-07-04: qty 1

## 2019-07-04 MED ORDER — ENOXAPARIN SODIUM 40 MG/0.4ML ~~LOC~~ SOLN
40.0000 mg | SUBCUTANEOUS | Status: DC
  Administered 2019-07-04 – 2019-07-07 (×4): 40 mg via SUBCUTANEOUS
  Filled 2019-07-04 (×4): qty 0.4

## 2019-07-04 MED ORDER — SODIUM CHLORIDE 0.9 % IV SOLN
INTRAVENOUS | Status: DC
  Administered 2019-07-04 – 2019-07-08 (×10): via INTRAVENOUS

## 2019-07-04 MED ORDER — HYDROMORPHONE HCL 1 MG/ML IJ SOLN
1.0000 mg | INTRAMUSCULAR | Status: DC | PRN
  Administered 2019-07-04 – 2019-07-06 (×8): 1 mg via INTRAVENOUS
  Filled 2019-07-04 (×8): qty 1

## 2019-07-04 MED ORDER — LORAZEPAM 2 MG/ML IJ SOLN
1.0000 mg | Freq: Four times a day (QID) | INTRAMUSCULAR | Status: AC | PRN
  Administered 2019-07-04 – 2019-07-05 (×3): 1 mg via INTRAVENOUS
  Filled 2019-07-04 (×3): qty 1

## 2019-07-04 MED ORDER — HYDRALAZINE HCL 20 MG/ML IJ SOLN: 10.0000 mg | Freq: Three times a day (TID) | INTRAMUSCULAR | Status: DC | PRN

## 2019-07-04 MED ORDER — ONDANSETRON HCL 4 MG/2ML IJ SOLN
4.0000 mg | Freq: Once | INTRAMUSCULAR | Status: AC | PRN
  Administered 2019-07-04: 12:00:00 4 mg via INTRAVENOUS
  Filled 2019-07-04 (×2): qty 2

## 2019-07-04 MED ORDER — POLYETHYLENE GLYCOL 3350 17 G PO PACK: 17.0000 g | PACK | Freq: Every day | ORAL | Status: DC | PRN

## 2019-07-04 MED ORDER — ONDANSETRON HCL 4 MG/2ML IJ SOLN
4.0000 mg | Freq: Four times a day (QID) | INTRAMUSCULAR | Status: DC | PRN
  Administered 2019-07-04 – 2019-07-08 (×8): 4 mg via INTRAVENOUS
  Filled 2019-07-04 (×9): qty 2

## 2019-07-04 MED ORDER — SODIUM CHLORIDE 0.9 % IV BOLUS
1000.0000 mL | Freq: Once | INTRAVENOUS | Status: AC
  Administered 2019-07-04 (×2): 1000 mL via INTRAVENOUS

## 2019-07-04 MED ORDER — SODIUM CHLORIDE (PF) 0.9 % IJ SOLN
INTRAMUSCULAR | Status: AC
  Administered 2019-07-04: 15:00:00
  Filled 2019-07-04: qty 50

## 2019-07-04 MED ORDER — ADULT MULTIVITAMIN W/MINERALS CH
1.0000 | ORAL_TABLET | Freq: Every day | ORAL | Status: DC
  Administered 2019-07-05 – 2019-07-08 (×4): 1 via ORAL
  Filled 2019-07-04 (×5): qty 1

## 2019-07-04 NOTE — H&P (Signed)
History and Physical  Paul Allen:[id number] DOB: January 24, 1981 DOA: 07/04/2019   Patient coming from: Home & is able to ambulate  Chief Complaint: Abdominal pain  HPI: Paul Allen is a 38 y.o. male with medical history significant for alcoholic pancreatitis, chronic alcohol abuse, presents to the ED complaining about right upper quadrant abdominal pain since 5 AM this morning.  Patient reported abdominal pain currently is across from the right, to the epigastric region.  Reports associated nausea/vomiting.  Denies any hematemesis, melena, fever/chills, diarrhea, chest pain, shortness of breath, cough, runny nose, dysuria.  Patient reported drinking about 7 beers on 07/03/2019, which he does on a daily basis.   ED Course: Patient remained afebrile, with leukocytosis, BP somewhat on the higher side.  Labs showed lipase of 819, with elevated AST, ALT.  CT of the abdomen showed findings consistent with acute pancreatitis, no evidence of pseudocyst formation, hepatic steatosis.  Patient admitted for further management  Review of Systems: Review of systems are otherwise negative   Past Medical History:  Diagnosis Date  . Pancreatitis    Past Surgical History:  Procedure Laterality Date  . APPENDECTOMY      Social History:  reports that he has been smoking cigarettes. He has been smoking about 1.50 packs per day. He has never used smokeless tobacco. He reports current alcohol use of about 5.0 standard drinks of alcohol per week. He reports current drug use. Drug: Marijuana.   Allergies  Allergen Reactions  . Penicillins Rash    Has patient had a PCN reaction causing immediate rash, facial/tongue/throat swelling, SOB or lightheadedness with hypotension: yes Has patient had a PCN reaction causing severe rash involving mucus membranes or skin necrosis: Yes Has patient had a PCN reaction that required hospitalization: No Has patient had a PCN reaction occurring within the last 10  years: No If all of the above answers are "NO", then may proceed with Cephalosporin use.     No family history on file.    Prior to Admission medications   Medication Sig Start Date End Date Taking? Authorizing Provider  Multiple Vitamin (MULTIVITAMIN WITH MINERALS) TABS tablet Take 1 tablet by mouth daily. 10/28/18  Yes Regalado, Belkys A, MD  naproxen sodium (ALEVE) 220 MG tablet Take 220 mg by mouth 2 (two) times daily as needed (pain).   Yes [provider]  colchicine 0.6 MG tablet Take 1 tablet (0.6 mg total) by mouth daily. Patient not taking: Reported on 07/04/2019 10/28/18   Regalado, Jerald Kief A, MD  nicotine (NICODERM CQ - DOSED IN MG/24 HOURS) 14 mg/24hr patch Place 1 patch (14 mg total) onto the skin daily. Patient not taking: Reported on 07/04/2019 10/28/18   Regalado, Jerald Kief A, MD  potassium chloride SA (K-DUR,KLOR-CON) 20 MEQ tablet Take 2 tablets (40 mEq total) by mouth daily. Patient not taking: Reported on 07/04/2019 10/28/18   Regalado, Jerald Kief A, MD  predniSONE (DELTASONE) 20 MG tablet Take 2 tablets (40 mg total) by mouth daily. Patient not taking: Reported on 07/04/2019 10/28/18   Elmarie Shiley, MD    Physical Exam: BP (!) 154/82 (BP Location: Left Arm)   Pulse 63   Temp (!) 97.5 F (36.4 C) (Oral)   Resp 20   SpO2 98%   General: In mild distress due to pain Eyes: Normal ENT: Normal Neck: Supple Cardiovascular: S1, S2 present Respiratory: CTA B Abdomen: Soft, generalized tenderness, nondistended, bowel sounds present Skin: Normal Musculoskeletal: No pedal edema bilaterally Psychiatric: Normal mood Neurologic:  No focal neurologic deficit noted          Labs on Admission:  Basic Metabolic Panel: Recent Labs  Lab 07/04/19 1105  NA 137  K 3.6  CL 96*  CO2 21*  GLUCOSE 173*  BUN <5*  CREATININE 0.73  CALCIUM 9.4   Liver Function Tests: Recent Labs  Lab 07/04/19 1105  AST 98*  ALT 58*  ALKPHOS 63  BILITOT 0.9  PROT 8.3*   ALBUMIN 4.8   Recent Labs  Lab 07/04/19 1105  LIPASE 819*   No results for input(s): AMMONIA in the last 168 hours. CBC: Recent Labs  Lab 07/04/19 1105  WBC 15.9*  NEUTROABS 13.4*  HGB 18.3*  HCT 53.9*  MCV 97.6  PLT 196   Cardiac Enzymes: No results for input(s): CKTOTAL, CKMB, CKMBINDEX, TROPONINI in the last 168 hours.  BNP (last 3 results) No results for input(s): BNP in the last 8760 hours.  ProBNP (last 3 results) No results for input(s): PROBNP in the last 8760 hours.  CBG: No results for input(s): GLUCAP in the last 168 hours.  Radiological Exams on Admission: Ct Abdomen Pelvis W Contrast  Result Date: 07/04/2019 CLINICAL DATA:  Nausea vomiting and right upper quadrant pain. EXAM: CT ABDOMEN AND PELVIS WITH CONTRAST TECHNIQUE: Multidetector CT imaging of the abdomen and pelvis was performed using the standard protocol following bolus administration of intravenous contrast. CONTRAST:  100mL OMNIPAQUE IOHEXOL 300 MG/ML  SOLN COMPARISON:  October 24, 2018 FINDINGS: Lower chest: No acute abnormality. Hepatobiliary: Hepatic steatosis. Normal appearance of the gallbladder. Pancreas: Edematous appearance of the head/uncinate process of the pancreas with peripancreatic fat stranding and small amount of fluid tracking along the second and third portion of the duodenum. Spleen: Normal in size without focal abnormality. Adrenals/Urinary Tract: Adrenal glands are unremarkable. Kidneys are normal, without renal calculi, focal lesion, or hydronephrosis. Bladder is unremarkable. Stomach/Bowel: Stomach is within normal limits. Post appendectomy. No evidence of bowel wall thickening, distention, or inflammatory changes. Vascular/Lymphatic: Aortic atherosclerosis, mild. No enlarged abdominal or pelvic lymph nodes. Shotty porta hepatis and upper abdominal retroperitoneal lymph nodes. Reproductive: Prostate is unremarkable. Other: No abdominal wall hernia or abnormality. Musculoskeletal: No  acute or significant osseous findings. IMPRESSION: 1. Edematous appearance of the head/uncinate process of the pancreas with peripancreatic fat stranding and small amount of fluid tracking along the second and third portion of the duodenum. These findings are most consistent with acute pancreatitis. No evidence of pseudocyst formation. 2. Hepatic steatosis. 3. Shotty porta hepatis and upper abdominal retroperitoneal lymph nodes, likely reactive. Electronically Signed   By: Ted Mcalpineobrinka  Dimitrova M.D.   On: 07/04/2019 13:58   Dg Chest Port 1 View  Result Date: 07/04/2019 CLINICAL DATA:  Leukocytosis EXAM: PORTABLE CHEST 1 VIEW COMPARISON:  09/08/2011 FINDINGS: The heart size and mediastinal contours are within normal limits. Both lungs are clear. The visualized skeletal structures are unremarkable. IMPRESSION: Normal chest Electronically Signed   By: Charlett NoseKevin  Dover M.D.   On: 07/04/2019 15:59    EKG: Independently reviewed.  Sinus rhythm, no acute ST changes, likely early repolarization  Assessment/Plan Present on Admission: . Acute pancreatitis . Acute alcoholic pancreatitis . Alcohol abuse  Principal Problem:   Acute alcoholic pancreatitis Active Problems:   Alcohol abuse   Acute pancreatitis  Acute alcoholic pancreatitis Afebrile, with leukocytosis (likely reactive, will trend) Lipase 819, AST 98, ALT 58, will trend CT abdomen showed findings consistent with acute pancreatitis, no evidence of pseudocyst formation, hepatic steatosis, normal gallbladder. N.p.o. for  now Aggressive IV hydration, pain management, antiemetics Monitor closely  Alcohol abuse Advised to quit CIWA protocol  Tobacco abuse Advised to quit Nicotine patch ordered     DVT prophylaxis: Lovenox  Code Status: Full  Family Communication: None at bedside  Disposition Plan: To be determined  Consults called: None  Admission status: Inpatient    Briant CedarNkeiruka J Ezenduka MD [facility]   If 7PM-7AM,  please contact night-coverage [email or web address]  07/04/2019, 5:50 PM

## 2019-07-04 NOTE — ED Triage Notes (Signed)
Patient complaining of RUQ pain-states he has been diagnosed with pancreatitis in the past-states pain/symptoms started around 5 am-states he drank 7 beers through out the day yesterday-N/V, diaphoretic

## 2019-07-04 NOTE — ED Notes (Signed)
ED TO INPATIENT HANDOFF REPORT  ED Nurse Name and Phone #: Ethel RanaHeather   S Name/Age/Gender Paul CreedBrian E Allen 38 y.o. male Room/Bed: WA06/WA06  Code Status   Code Status: Full Code  Home/SNF/Other Home Patient oriented to: x4 Is this baseline? Yes   Triage Complete: Triage complete  Chief Complaint Possible pancreatitis?  Triage Note Patient complaining of RUQ pain-states he has been diagnosed with pancreatitis in the past-states pain/symptoms started around 5 am-states he drank 7 beers through out the day yesterday-N/V, diaphoretic    Allergies Allergies  Allergen Reactions  . Penicillins Rash    Has patient had a PCN reaction causing immediate rash, facial/tongue/throat swelling, SOB or lightheadedness with hypotension: yes Has patient had a PCN reaction causing severe rash involving mucus membranes or skin necrosis: Yes Has patient had a PCN reaction that required hospitalization: No Has patient had a PCN reaction occurring within the last 10 years: No If all of the above answers are "NO", then may proceed with Cephalosporin use.     Level of Care/Admitting Diagnosis ED Disposition    ED Disposition Condition Comment   Admit  [facility] [[id number]]  Level of Care: Telemetry [5]  Admit to tele based on following criteria: Complex arrhythmia (Bradycardia/Tachycardia)  Covid Evaluation: Confirmed COVID Negative  Diagnosis: Acute pancreatitis [577.0.ICD-9-CM]  Admitting Physician: Briant CedarZENDUKA, NKEIRUKA J [[id number]]  Attending Physician: Briant CedarZENDUKA, NKEIRUKA J [[id number]]  Estimated length of stay: past midnight tomorrow  Certification:: I certify this patient will need inpatient services for at least 2 midnights  PT Class (Do Not Modify): Inpatient [101]  PT Acc Code (Do Not Modify): Private [1]       B Medical/Surgery History Past Medical History:  Diagnosis Date  . Pancreatitis    Past Surgical History:  Procedure Laterality Date  .  APPENDECTOMY       A IV Location/Drains/Wounds Patient Lines/Drains/Airways Status   Active Line/Drains/Airways    Name:   Placement date:   Placement time:   Site:   Days:   Peripheral IV 07/04/19 Right Arm   07/04/19    1306    Arm   less than 1          Intake/Output Last 24 hours No intake or output data in the 24 hours ending 07/04/19 1555  Labs/Imaging Results for orders placed or performed during the hospital encounter of 07/04/19 (from the past 48 hour(s))  Lipase, blood     Status: Abnormal   Collection Time: 07/04/19 11:05 AM  Result Value Ref Range   Lipase 819 (H) 11 - 51 U/L    Comment: RESULTS CONFIRMED BY MANUAL DILUTION Performed at [facility], [address] [place], [place] [address]   Comprehensive metabolic panel     Status: Abnormal   Collection Time: 07/04/19 11:05 AM  Result Value Ref Range   Sodium 137 135 - 145 mmol/L   Potassium 3.6 3.5 - 5.1 mmol/L   Chloride 96 (L) 98 - 111 mmol/L   CO2 21 (L) 22 - 32 mmol/L   Glucose, Bld 173 (H) 70 - 99 mg/dL   BUN <5 (L) 6 - 20 mg/dL   Creatinine, Ser 8.460.73 0.61 - 1.24 mg/dL   Calcium 9.4 8.9 - 96.210.3 mg/dL   Total Protein 8.3 (H) 6.5 - 8.1 g/dL   Albumin 4.8 3.5 - 5.0 g/dL   AST 98 (H) 15 - 41 U/L   ALT 58 (H) 0 - 44 U/L   Alkaline  Phosphatase 63 38 - 126 U/L   Total Bilirubin 0.9 0.3 - 1.2 mg/dL   GFR calc non Af Amer >60 >60 mL/min   GFR calc Af Amer >60 >60 mL/min   Anion gap 20 (H) 5 - 15    Comment: Performed at [facility], [address] [place], [place] [address]  CBC with Differential     Status: Abnormal   Collection Time: 07/04/19 11:05 AM  Result Value Ref Range   WBC 15.9 (H) 4.0 - 10.5 K/uL   RBC 5.52 4.22 - 5.81 MIL/uL   Hemoglobin 18.3 (H) 13.0 - 17.0 g/dL   HCT 60.453.9 (H) 54.039.0 - 98.152.0 %   MCV 97.6 80.0 - 100.0 fL   MCH 33.2 26.0 - 34.0 pg   MCHC 34.0 30.0 - 36.0 g/dL   RDW 19.113.5 47.811.5 - 29.515.5 %   Platelets 196 150 - 400 K/uL   nRBC 0.0 0.0 -  0.2 %   Neutrophils Relative % 84 %   Neutro Abs 13.4 (H) 1.7 - 7.7 K/uL   Lymphocytes Relative 10 %   Lymphs Abs 1.7 0.7 - 4.0 K/uL   Monocytes Relative 4 %   Monocytes Absolute 0.6 0.1 - 1.0 K/uL   Eosinophils Relative 0 %   Eosinophils Absolute 0.0 0.0 - 0.5 K/uL   Basophils Relative 1 %   Basophils Absolute 0.1 0.0 - 0.1 K/uL   Immature Granulocytes 1 %   Abs Immature Granulocytes 0.08 (H) 0.00 - 0.07 K/uL    Comment: Performed at [facility], [address] [place], [place] [address]  Ethanol     Status: None   Collection Time: 07/04/19 11:12 AM  Result Value Ref Range   Alcohol, Ethyl (B) <10 <10 mg/dL    Comment: (NOTE) Lowest detectable limit for serum alcohol is 10 mg/dL. For medical purposes only. Performed at [facility], [address] [place], [place] [address]   SARS Coronavirus 2 (CEPHEID - Performed in [facility] hospital lab), Hosp Order     Status: None   Collection Time: 07/04/19  1:19 PM   Specimen: Nasopharyngeal Swab  Result Value Ref Range   SARS Coronavirus 2 NEGATIVE NEGATIVE    Comment: (NOTE) If result is NEGATIVE SARS-CoV-2 target nucleic acids are NOT DETECTED. The SARS-CoV-2 RNA is generally detectable in upper and lower  respiratory specimens during the acute phase of infection. The lowest  concentration of SARS-CoV-2 viral copies this assay can detect is 250  copies / mL. A negative result does not preclude SARS-CoV-2 infection  and should not be used as the sole basis for treatment or other  patient management decisions.  A negative result may occur with  improper specimen collection / handling, submission of specimen other  than nasopharyngeal swab, presence of viral mutation(s) within the  areas targeted by this assay, and inadequate number of viral copies  (<250 copies / mL). A negative result must be combined with clinical  observations, patient history, and epidemiological information. If  result is POSITIVE SARS-CoV-2 target nucleic acids are DETECTED. The SARS-CoV-2 RNA is generally detectable in upper and lower  respiratory specimens dur ing the acute phase of infection.  Positive  results are indicative of active infection with SARS-CoV-2.  Clinical  correlation with patient history and other diagnostic information is  necessary to determine patient infection status.  Positive results do  not rule out bacterial infection or co-infection with other viruses. If result is PRESUMPTIVE  POSTIVE SARS-CoV-2 nucleic acids MAY BE PRESENT.   A presumptive positive result was obtained on the submitted specimen  and confirmed on repeat testing.  While 2019 novel coronavirus  (SARS-CoV-2) nucleic acids may be present in the submitted sample  additional confirmatory testing may be necessary for epidemiological  and / or clinical management purposes  to differentiate between  SARS-CoV-2 and other Sarbecovirus currently known to infect humans.  If clinically indicated additional testing with an alternate test  methodology [phone]) is advised. The SARS-CoV-2 RNA is generally  detectable in upper and lower respiratory sp ecimens during the acute  phase of infection. The expected result is Negative. Fact Sheet for Patients:  StrictlyIdeas.no Fact Sheet for Healthcare Providers: BankingDealers.co.za This test is not yet approved or cleared by the [place] FDA and has been authorized for detection and/or diagnosis of SARS-CoV-2 by FDA under an Emergency Use Authorization (EUA).  This EUA will remain in effect (meaning this test can be used) for the duration of the COVID-19 declaration under Section 564(b)(1) of the Act, 21 U.S.C. section 360bbb-3(b)(1), unless the authorization is terminated or revoked sooner. Performed at [facility], [place], [place], [place] [address]   Urinalysis, Routine w reflex  microscopic     Status: Abnormal   Collection Time: 07/04/19  2:39 PM  Result Value Ref Range   Color, Urine YELLOW YELLOW   APPearance CLEAR CLEAR   Specific Gravity, Urine 1.041 (H) 1.005 - 1.030   pH 5.0 5.0 - 8.0   Glucose, UA NEGATIVE NEGATIVE mg/dL   Hgb urine dipstick NEGATIVE NEGATIVE   Bilirubin Urine NEGATIVE NEGATIVE   Ketones, ur 20 (A) NEGATIVE mg/dL   Protein, ur NEGATIVE NEGATIVE mg/dL   Nitrite NEGATIVE NEGATIVE   Leukocytes,Ua NEGATIVE NEGATIVE    Comment: Performed at [place] [address] [place], [place] [address]   Ct Abdomen Pelvis W Contrast  Result Date: 07/04/2019 CLINICAL DATA:  Nausea vomiting and right upper quadrant pain. EXAM: CT ABDOMEN AND PELVIS WITH CONTRAST TECHNIQUE: Multidetector CT imaging of the abdomen and pelvis was performed using the standard protocol following bolus administration of intravenous contrast. CONTRAST:  181mL OMNIPAQUE IOHEXOL 300 MG/ML  SOLN COMPARISON:  October 24, 2018 FINDINGS: Lower chest: No acute abnormality. Hepatobiliary: Hepatic steatosis. Normal appearance of the gallbladder. Pancreas: Edematous appearance of the head/uncinate process of the pancreas with peripancreatic fat stranding and small amount of fluid tracking along the second and third portion of the duodenum. Spleen: Normal in size without focal abnormality. Adrenals/Urinary Tract: Adrenal glands are unremarkable. Kidneys are normal, without renal calculi, focal lesion, or hydronephrosis. Bladder is unremarkable. Stomach/Bowel: Stomach is within normal limits. Post appendectomy. No evidence of bowel wall thickening, distention, or inflammatory changes. Vascular/Lymphatic: Aortic atherosclerosis, mild. No enlarged abdominal or pelvic lymph nodes. Shotty porta hepatis and upper abdominal retroperitoneal lymph nodes. Reproductive: Prostate is unremarkable. Other: No abdominal wall hernia or abnormality. Musculoskeletal: No acute or significant  osseous findings. IMPRESSION: 1. Edematous appearance of the head/uncinate process of the pancreas with peripancreatic fat stranding and small amount of fluid tracking along the second and third portion of the duodenum. These findings are most consistent with acute pancreatitis. No evidence of pseudocyst formation. 2. Hepatic steatosis. 3. Shotty porta hepatis and upper abdominal retroperitoneal lymph nodes, likely reactive. Electronically Signed   By: Fidela Salisbury M.D.   On: 07/04/2019 13:58    Pending Labs [employer] (From admission, onward)    Start  Ordered   07/05/19 0500  CBC  Tomorrow morning,   R     07/04/19 1537   07/05/19 0500  Comprehensive metabolic panel  Tomorrow morning,   R     07/04/19 1551   07/05/19 0500  Lipase, blood  Tomorrow morning,   R     07/04/19 1551          Vitals/Pain Today's Vitals   07/04/19 1230 07/04/19 1317 07/04/19 1400 07/04/19 1421  BP: (!) 178/107 (!) 180/92 (!) 166/95   Pulse: (!) 56 (!) 58 (!) 59   Resp: 15 17 17    Temp:  98.6 F (37 C)    TempSrc:  Oral    SpO2: 98% 100% 98%   PainSc:  10-Worst pain ever  8     Isolation Precautions No active isolations  Medications Medications  LORazepam (ATIVAN) tablet 1 mg (has no administration in time range)    Or  LORazepam (ATIVAN) injection 1 mg (has no administration in time range)  thiamine (VITAMIN B-1) tablet 100 mg (has no administration in time range)    Or  thiamine (B-1) injection 100 mg (has no administration in time range)  folic acid (FOLVITE) tablet 1 mg (has no administration in time range)  multivitamin with minerals tablet 1 tablet (has no administration in time range)  0.9 %  sodium chloride infusion (has no administration in time range)  HYDROmorphone (DILAUDID) injection 1 mg (has no administration in time range)  enoxaparin (LOVENOX) injection 40 mg (has no administration in time range)  oxyCODONE (Oxy IR/ROXICODONE) immediate release tablet 5 mg (has  no administration in time range)  polyethylene glycol (MIRALAX / GLYCOLAX) packet 17 g (has no administration in time range)  ondansetron (ZOFRAN) tablet 4 mg (has no administration in time range)    Or  ondansetron (ZOFRAN) injection 4 mg (has no administration in time range)  hydrALAZINE (APRESOLINE) injection 10 mg (has no administration in time range)  ondansetron (ZOFRAN) injection 4 mg (4 mg Intravenous Given 07/04/19 1130)  HYDROmorphone (DILAUDID) injection 1 mg (1 mg Intravenous Given 07/04/19 1129)  sodium chloride 0.9 % bolus 1,000 mL (0 mLs Intravenous Stopped 07/04/19 1427)  iohexol (OMNIPAQUE) 300 MG/ML solution 100 mL (100 mLs Intravenous Contrast Given 07/04/19 1325)  sodium chloride 0.9 % bolus 1,000 mL (1,000 mLs Intravenous Bolus from Bag 07/04/19 1511)  sodium chloride (PF) 0.9 % injection (  Given by Other 07/04/19 1524)  HYDROmorphone (DILAUDID) injection 1 mg (1 mg Intravenous Given 07/04/19 1317)    Mobility walks Low fall risk   Focused Assessments Cardiac Assessment Handoff:  Cardiac Rhythm: Normal sinus rhythm No results found for: CKTOTAL, CKMB, CKMBINDEX, TROPONINI No results found for: DDIMER Does the Patient currently have chest pain? No      R Recommendations: See Admitting Provider Note  Report given to:   Additional Notes: na

## 2019-07-04 NOTE — ED Provider Notes (Signed)
[facility]-EMERGENCY DEPT Provider Note   CSN: [id number] Arrival date & time: 07/04/19  1032    History   Chief Complaint Chief Complaint  Patient presents with  . Abdominal Pain    HPI Paul Allen is a 38 y.o. male.     Paul Allen is a 38 y.o. male with a history of alcoholic pancreatitis, colitis, and chronic alcohol abuse, who presents to the emergency department for evaluation of pain which started at about 5 AM this morning in the right upper quadrant of his abdomen.  He reports pain started as a very mild ache but has since become severe pain across the entire top of his abdomen.  He reports multiple episodes of nausea and vomiting this morning.  Denies hematemesis.  No diarrhea, constipation, melena or hematochezia.  He reports he is been sweating but denies fevers.  Denies chest pain.  Denies shortness of breath but reports when he takes a deep breath it makes it hurt more in his abdomen.  He denies any urinary symptoms.  Reports this feels somewhat similar to his previous episode of pancreatitis which was thought to be brought on by alcohol use.  Reports he drinks 7 beers yesterday and this is fairly typical for him on a daily basis.  No alcohol today.  Denies any other drug use.  History of appendectomy but no other prior abdominal surgeries.  No other aggravating or alleviating factors.     Past Medical History:  Diagnosis Date  . Pancreatitis     Patient Active Problem List   Diagnosis Date Noted  . Effusion, right knee   . Acute pancreatitis 10/25/2018  . Acute alcoholic pancreatitis 10/24/2018  . Colitis 10/24/2018  . Hyponatremia 10/24/2018  . Hypokalemia 10/24/2018  . Macrocytosis 10/24/2018  . Alcohol abuse 10/24/2018    Past Surgical History:  Procedure Laterality Date  . APPENDECTOMY          Home Medications    Prior to Admission medications   Medication Sig Start Date End Date Taking? Authorizing Provider   Multiple Vitamin (MULTIVITAMIN WITH MINERALS) TABS tablet Take 1 tablet by mouth daily. 10/28/18  Yes Regalado, Belkys A, MD  naproxen sodium (ALEVE) 220 MG tablet Take 220 mg by mouth 2 (two) times daily as needed (pain).   Yes [provider]  colchicine 0.6 MG tablet Take 1 tablet (0.6 mg total) by mouth daily. Patient not taking: Reported on 07/04/2019 10/28/18   Regalado, Jon BillingsBelkys A, MD  nicotine (NICODERM CQ - DOSED IN MG/24 HOURS) 14 mg/24hr patch Place 1 patch (14 mg total) onto the skin daily. Patient not taking: Reported on 07/04/2019 10/28/18   Regalado, Jon BillingsBelkys A, MD  potassium chloride SA (K-DUR,KLOR-CON) 20 MEQ tablet Take 2 tablets (40 mEq total) by mouth daily. Patient not taking: Reported on 07/04/2019 10/28/18   Regalado, Jon BillingsBelkys A, MD  predniSONE (DELTASONE) 20 MG tablet Take 2 tablets (40 mg total) by mouth daily. Patient not taking: Reported on 07/04/2019 10/28/18   Alba Coryegalado, Belkys A, MD    Family History No family history on file.  Social History Social History   Tobacco Use  . Smoking status: Current Every Day Smoker    Packs/day: 1.50    Types: Cigarettes  . Smokeless tobacco: Never Used  Substance Use Topics  . Alcohol use: Yes    Alcohol/week: 5.0 standard drinks    Types: 5 Cans of beer per week  . Drug use: Yes    Types:  Marijuana     Allergies   Penicillins   Review of Systems Review of Systems  Constitutional: Positive for diaphoresis. Negative for chills and fever.  HENT: Negative.   Respiratory: Negative for cough and shortness of breath.   Cardiovascular: Negative for chest pain.  Gastrointestinal: Positive for abdominal pain, nausea and vomiting. Negative for constipation and diarrhea.  Genitourinary: Negative for dysuria, flank pain, frequency and hematuria.  Musculoskeletal: Negative for arthralgias and myalgias.  Skin: Negative for color change and rash.  Neurological: Negative for dizziness, syncope and light-headedness.  All  other systems reviewed and are negative.    Physical Exam Updated Vital Signs BP (!) 177/97 (BP Location: Right Arm)   Pulse (!) 52   Temp 98.5 F (36.9 C) (Oral)   Resp 18   SpO2 100%   Physical Exam Vitals signs and nursing note reviewed.  Constitutional:      General: He is not in acute distress.    Appearance: He is well-developed and normal weight. He is not ill-appearing or diaphoretic.  HENT:     Head: Normocephalic and atraumatic.     Mouth/Throat:     Mouth: Mucous membranes are moist.     Pharynx: Oropharynx is clear.  Eyes:     General:        Right eye: No discharge.        Left eye: No discharge.     Pupils: Pupils are equal, round, and reactive to light.  Neck:     Musculoskeletal: Neck supple.  Cardiovascular:     Rate and Rhythm: Normal rate and regular rhythm.     Heart sounds: Normal heart sounds. No murmur. No friction rub. No gallop.   Pulmonary:     Effort: Pulmonary effort is normal. No respiratory distress.     Breath sounds: Normal breath sounds. No wheezing or rales.     Comments: Respirations equal and unlabored, patient able to speak in full sentences, lungs clear to auscultation bilaterally Abdominal:     General: Bowel sounds are normal. There is no distension.     Palpations: Abdomen is soft. There is no mass.     Tenderness: There is abdominal tenderness. There is guarding.     Comments: Abdomen soft, non-distended, tenderness across the upper abdomen with some guarding, no focal lower abdominal tenderness  Musculoskeletal:        General: No deformity.  Skin:    General: Skin is warm and dry.     Capillary Refill: Capillary refill takes less than 2 seconds.  Neurological:     Mental Status: He is alert and oriented to person, place, and time.     Coordination: Coordination normal.     Comments: Speech is clear, able to follow commands Moves extremities without ataxia, coordination intact  Psychiatric:        Mood and Affect: Mood  normal.        Behavior: Behavior normal.      ED Treatments / Results  Labs (all labs ordered are listed, but only abnormal results are displayed) Labs Reviewed  LIPASE, BLOOD - Abnormal; Notable for the following components:      Result Value   Lipase 819 (*)    All other components within normal limits  COMPREHENSIVE METABOLIC PANEL - Abnormal; Notable for the following components:   Chloride 96 (*)    CO2 21 (*)    Glucose, Bld 173 (*)    BUN <5 (*)    Total Protein 8.3 (*)  AST 98 (*)    ALT 58 (*)    Anion gap 20 (*)    All other components within normal limits  CBC WITH DIFFERENTIAL/PLATELET - Abnormal; Notable for the following components:   WBC 15.9 (*)    Hemoglobin 18.3 (*)    HCT 53.9 (*)    Neutro Abs 13.4 (*)    Abs Immature Granulocytes 0.08 (*)    All other components within normal limits  SARS CORONAVIRUS 2 (HOSPITAL ORDER, PERFORMED IN [facility])  ETHANOL  URINALYSIS, ROUTINE W REFLEX MICROSCOPIC    EKG EKG Interpretation  Date/Time:  Thursday July 04 2019 11:40:20 EDT Ventricular Rate:  50 PR Interval:    QRS Duration: 101 QT Interval:  513 QTC Calculation: 468 R Axis:   70 Text Interpretation:  Sinus rhythm Atrial premature complex Borderline short PR interval RSR' in V1 or V2, probably normal variant ST elev, probable normal early repol pattern No STEMI  Confirmed by Alona BeneLong, Joshua [phone]([id number]) on 07/04/2019 11:54:28 AM   Radiology Ct Abdomen Pelvis W Contrast  Result Date: 07/04/2019 CLINICAL DATA:  Nausea vomiting and right upper quadrant pain. EXAM: CT ABDOMEN AND PELVIS WITH CONTRAST TECHNIQUE: Multidetector CT imaging of the abdomen and pelvis was performed using the standard protocol following bolus administration of intravenous contrast. CONTRAST:  100mL OMNIPAQUE IOHEXOL 300 MG/ML  SOLN COMPARISON:  October 24, 2018 FINDINGS: Lower chest: No acute abnormality. Hepatobiliary: Hepatic steatosis. Normal appearance of the  gallbladder. Pancreas: Edematous appearance of the head/uncinate process of the pancreas with peripancreatic fat stranding and small amount of fluid tracking along the second and third portion of the duodenum. Spleen: Normal in size without focal abnormality. Adrenals/Urinary Tract: Adrenal glands are unremarkable. Kidneys are normal, without renal calculi, focal lesion, or hydronephrosis. Bladder is unremarkable. Stomach/Bowel: Stomach is within normal limits. Post appendectomy. No evidence of bowel wall thickening, distention, or inflammatory changes. Vascular/Lymphatic: Aortic atherosclerosis, mild. No enlarged abdominal or pelvic lymph nodes. Shotty porta hepatis and upper abdominal retroperitoneal lymph nodes. Reproductive: Prostate is unremarkable. Other: No abdominal wall hernia or abnormality. Musculoskeletal: No acute or significant osseous findings. IMPRESSION: 1. Edematous appearance of the head/uncinate process of the pancreas with peripancreatic fat stranding and small amount of fluid tracking along the second and third portion of the duodenum. These findings are most consistent with acute pancreatitis. No evidence of pseudocyst formation. 2. Hepatic steatosis. 3. Shotty porta hepatis and upper abdominal retroperitoneal lymph nodes, likely reactive. Electronically Signed   By: Ted Mcalpineobrinka  Dimitrova M.D.   On: 07/04/2019 13:58    Procedures Procedures (including critical care time)  Medications Ordered in ED Medications  iohexol (OMNIPAQUE) 300 MG/ML solution 100 mL (has no administration in time range)  sodium chloride 0.9 % bolus 1,000 mL (has no administration in time range)  sodium chloride (PF) 0.9 % injection (has no administration in time range)  ondansetron (ZOFRAN) injection 4 mg (4 mg Intravenous Given 07/04/19 1130)  HYDROmorphone (DILAUDID) injection 1 mg (1 mg Intravenous Given 07/04/19 1129)  sodium chloride 0.9 % bolus 1,000 mL (0 mLs Intravenous Paused 07/04/19 1250)      Initial Impression / Assessment and Plan / ED Course  I have reviewed the triage vital signs and the nursing notes.  Pertinent labs & imaging results that were available during my care of the patient were reviewed by me and considered in my medical decision making (see chart for details).  Patient presents with upper abdominal pain starting suddenly this morning with associated  nausea and vomiting. No fevers. Feels similar to previous episode of pancreatitis, which was alcohol induced. Patient reports drinking daily, 7 beers yesterday. Suspect pancreatitis again. Will check abdominal labs and CT abdomen/pelvis. IV fluids, dilaudid and zofran given.  Labs signifcant for leukocytosis of 15.9, and elevated hgb of 18.3, suspect some degree of hemoconcentration in the setting of dehydration. Glucose 173. Anion gap 20, suspect this is more likely do to dehydration than DKA. Lipase 819 consistent with pancreatitis. Ethanol level neg today. COVID swab ordered.  CT consistent with pancreatitis w/o evidence of pseudocyst. Hepatic steatosis noted as well.  Will consult for admission.  Case discussed with Dr. Sharolyn DouglasEzenduka who will see and admit the patient.  Final Clinical Impressions(s) / ED Diagnoses   Final diagnoses:  Alcohol-induced acute pancreatitis, unspecified complication status    ED Discharge Orders    None       Dartha LodgeFord, Rogene Meth N, [place] JerseyPA-C 07/04/19 1443    Maia PlanLong, Joshua G, MD 07/04/19 Ernestina [id number]

## 2019-07-05 LAB — COMPREHENSIVE METABOLIC PANEL
ALT: 43 U/L (ref 0–44)
AST: 53 U/L — ABNORMAL HIGH (ref 15–41)
Albumin: 4 g/dL (ref 3.5–5.0)
Alkaline Phosphatase: 55 U/L (ref 38–126)
Anion gap: 15 (ref 5–15)
BUN: <5 mg/dL — ABNORMAL LOW (ref 6–20)
CO2: 23 mmol/L (ref 22–32)
Calcium: 8.3 mg/dL — ABNORMAL LOW (ref 8.9–10.3)
Chloride: 96 mmol/L — ABNORMAL LOW (ref 98–111)
Creatinine, Ser: 0.58 mg/dL — ABNORMAL LOW (ref 0.61–1.24)
GFR calc Af Amer: >60 mL/min (ref >60)
GFR calc non Af Amer: >60 mL/min (ref >60)
Glucose, Bld: 128 mg/dL — ABNORMAL HIGH (ref 70–99)
Potassium: 3.8 mmol/L (ref 3.5–5.1)
Sodium: 134 mmol/L — ABNORMAL LOW (ref 135–145)
Total Bilirubin: 1 mg/dL (ref 0.3–1.2)
Total Protein: 7 g/dL (ref 6.5–8.1)

## 2019-07-05 LAB — CBC
HCT: 51.6 % (ref 39.0–52.0)
Hemoglobin: 17.5 g/dL — ABNORMAL HIGH (ref 13.0–17.0)
MCH: 33.6 pg (ref 26.0–34.0)
MCHC: 33.9 g/dL (ref 30.0–36.0)
MCV: 99 fL (ref 80.0–100.0)
Platelets: 172 10*3/uL (ref 150–400)
RBC: 5.21 MIL/uL (ref 4.22–5.81)
RDW: 13.7 % (ref 11.5–15.5)
WBC: 14.9 10*3/uL — ABNORMAL HIGH (ref 4.0–10.5)
nRBC: 0 % (ref 0.0–0.2)

## 2019-07-05 LAB — LIPID PANEL
Cholesterol: 162 mg/dL (ref 0–200)
HDL: 46 mg/dL (ref >40)
LDL Cholesterol: 66 mg/dL (ref 0–99)
Total CHOL/HDL Ratio: 3.5 RATIO
Triglycerides: 252 mg/dL — ABNORMAL HIGH (ref <150)
VLDL: 50 mg/dL — ABNORMAL HIGH (ref 0–40)

## 2019-07-05 LAB — URINALYSIS, ROUTINE W REFLEX MICROSCOPIC
Bacteria, UA: NONE SEEN
Bilirubin Urine: NEGATIVE
Glucose, UA: NEGATIVE mg/dL
Ketones, ur: 5 mg/dL — AB
Leukocytes,Ua: NEGATIVE
Nitrite: NEGATIVE
Protein, ur: NEGATIVE mg/dL
Specific Gravity, Urine: 1.015 (ref 1.005–1.030)
pH: 6 (ref 5.0–8.0)

## 2019-07-05 LAB — LIPASE, BLOOD: Lipase: 390 U/L — ABNORMAL HIGH (ref 11–51)

## 2019-07-05 NOTE — Progress Notes (Signed)
microscopic and  Urine cultures sent to the lab

## 2019-07-05 NOTE — Progress Notes (Signed)
[facility]                                                                              Patient Demographics  Paul Allen, is a 38 y.o. male, DOB - May 15, 1981, [id number]  Admit date - 07/04/2019   Admitting Physician Briant Cedar, MD  Outpatient Primary MD for the patient is Patient, No Pcp Per  Outpatient specialists:   LOS - 1  days   Medical records reviewed and are as summarized below:    Chief Complaint  Patient presents with  . Abdominal Pain       Brief summary   Patient is a 38 year old male with history of alcoholic pancreatitis, chronic alcohol abuse presented to ED with right upper quadrant abdominal pain on the morning of admission.  Patient reported abdominal pain, right upper quadrant and radiating to epigastric region, with nausea and vomiting.  No hematemesis, melena or hematochezia. Patient reported drinking 7-8 beers daily, last on 7/15.  CT abdomen showed findings consistent with acute pancreatitis, gallbladder normal.   Assessment & Plan    Principal Problem:   Acute alcoholic pancreatitis -Abdominal pain slightly better from admission, no vomiting - will continue aggressive IV fluid hydration, placed on clear liquid diet -Lipase improving 390 from 819 on 7/16  -Continue pain control, antiemetics -Triglycerides 252.  CT abdomen did not show any cholelithiasis or cholecystitis.  Active Problems: Elevated transaminases -Likely due to alcohol abuse, LFTs improving  Alcohol abuse -Counseled strongly on alcohol cessation -High risk of rapid deterioration with alcohol withdrawals, drinks a 7-8 beers every day, last drink on 7/15 -Continue CIWA protocol with Ativan, IV fluids, folic acid, thiamine   Code Status: Full CODE STATUS DVT Prophylaxis: Lovenox Family Communication: Discussed in detail with the patient, all imaging results, lab results explained to the patient    Disposition Plan: Possible DC home in 1 or  2 days once tolerating solids and pain controlled  Time Spent in minutes 35 minutes  Procedures:  None  Consultants:   None  Antimicrobials:   Anti-infectives (From admission, onward)   None          Medications  Scheduled Meds: . enoxaparin (LOVENOX) injection  40 mg Subcutaneous Q24H  . folic acid  1 mg Oral Daily  . multivitamin with minerals  1 tablet Oral Daily  . nicotine  21 mg Transdermal Daily  . thiamine  100 mg Oral Daily   Or  . thiamine  100 mg Intravenous Daily   Continuous Infusions: . sodium chloride 125 mL/hr at 07/05/19 1055   PRN Meds:.hydrALAZINE, HYDROmorphone (DILAUDID) injection, LORazepam **OR** LORazepam, ondansetron **OR** ondansetron (ZOFRAN) IV, oxyCODONE, polyethylene glycol      Subjective:   Paul Allen was seen and examined today.  Abdominal pain slightly better from the time of admission, 5/10, epigastric region.  No nausea or vomiting.  No fevers. Patient denies dizziness, chest pain, shortness of breath,  new weakness, numbess, tingling. No acute events overnight.    Objective:   Vitals:   07/04/19 1530 07/04/19 1638 07/05/19 0007 07/05/19 0611  BP: 140/87 (!) 154/82 (!) 155/86 134/88  Pulse: Marland Kitchen)  55 63 (!) 58 79  Resp: 17 20 20 14   Temp:  (!) 97.5 F (36.4 C) 98 F (36.7 C) 98.8 F (37.1 C)  TempSrc:  Oral Oral Oral  SpO2: 98% 98% 97% 95%    Intake/Output Summary (Last 24 hours) at 07/05/2019 1124 Last data filed at 07/04/2019 1800 Gross per 24 hour  Intake 112.45 ml  Output -  Net 112.45 ml     Wt Readings from Last 3 Encounters:  10/24/18 78 kg  05/13/18 77.1 kg     Exam  General: Alert and oriented x 3, NAD  Eyes:   HEENT:  Atraumatic, normocephalic, normal oropharynx  Cardiovascular: S1 S2 auscultated, Regular rate and rhythm.  Respiratory: Clear to auscultation bilaterally, no wheezing, rales or rhonchi  Gastrointestinal: Soft, epigastric TTP , nondistended, + bowel sounds  Ext: no pedal  edema bilaterally  Neuro: No new FND's  Musculoskeletal: No digital cyanosis, clubbing  Skin: No rashes  Psych: Normal affect and demeanor, alert and oriented x3    Data Reviewed:  I have personally reviewed following labs and imaging studies  Micro Results Recent Results (from the past 240 hour(s))  SARS Coronavirus 2 (CEPHEID - Performed in [facility] hospital lab), Hosp Order     Status: None   Collection Time: 07/04/19  1:19 PM   Specimen: Nasopharyngeal Swab  Result Value Ref Range Status   SARS Coronavirus 2 NEGATIVE NEGATIVE Final    Comment: (NOTE) If result is NEGATIVE SARS-CoV-2 target nucleic acids are NOT DETECTED. The SARS-CoV-2 RNA is generally detectable in upper and lower  respiratory specimens during the acute phase of infection. The lowest  concentration of SARS-CoV-2 viral copies this assay can detect is 250  copies / mL. A negative result does not preclude SARS-CoV-2 infection  and should not be used as the sole basis for treatment or other  patient management decisions.  A negative result may occur with  improper specimen collection / handling, submission of specimen other  than nasopharyngeal swab, presence of viral mutation(s) within the  areas targeted by this assay, and inadequate number of viral copies  (<250 copies / mL). A negative result must be combined with clinical  observations, patient history, and epidemiological information. If result is POSITIVE SARS-CoV-2 target nucleic acids are DETECTED. The SARS-CoV-2 RNA is generally detectable in upper and lower  respiratory specimens dur ing the acute phase of infection.  Positive  results are indicative of active infection with SARS-CoV-2.  Clinical  correlation with patient history and other diagnostic information is  necessary to determine patient infection status.  Positive results do  not rule out bacterial infection or co-infection with other viruses. If result is PRESUMPTIVE POSTIVE  SARS-CoV-2 nucleic acids MAY BE PRESENT.   A presumptive positive result was obtained on the submitted specimen  and confirmed on repeat testing.  While 2019 novel coronavirus  (SARS-CoV-2) nucleic acids may be present in the submitted sample  additional confirmatory testing may be necessary for epidemiological  and / or clinical management purposes  to differentiate between  SARS-CoV-2 and other Sarbecovirus currently known to infect humans.  If clinically indicated additional testing with an alternate test  methodology [phone]) is advised. The SARS-CoV-2 RNA is generally  detectable in upper and lower respiratory sp ecimens during the acute  phase of infection. The expected result is Negative. Fact Sheet for Patients:  BoilerBrush.com.cy Fact Sheet for Healthcare Providers: https://pope.com/ This test is not yet approved or cleared by the [place]  FDA and has been authorized for detection and/or diagnosis of SARS-CoV-2 by FDA under an Emergency Use Authorization (EUA).  This EUA will remain in effect (meaning this test can be used) for the duration of the COVID-19 declaration under Section 564(b)(1) of the Act, 21 U.S.C. section 360bbb-3(b)(1), unless the authorization is terminated or revoked sooner. Performed at [facility], [address], [place], [place] [address]     Radiology Reports Ct Abdomen Pelvis W Contrast  Result Date: 07/04/2019 CLINICAL DATA:  Nausea vomiting and right upper quadrant pain. EXAM: CT ABDOMEN AND PELVIS WITH CONTRAST TECHNIQUE: Multidetector CT imaging of the abdomen and pelvis was performed using the standard protocol following bolus administration of intravenous contrast. CONTRAST:  OMNIPAQUE IOHEXOL 300 MG/ML  SOLN COMPARISON:  October 24, 2018 FINDINGS: Lower chest: No acute abnormality. Hepatobiliary: Hepatic steatosis. Normal appearance of the gallbladder. Pancreas:  Edematous appearance of the head/uncinate process of the pancreas with peripancreatic fat stranding and small amount of fluid tracking along the second and third portion of the duodenum. Spleen: Normal in size without focal abnormality. Adrenals/Urinary Tract: Adrenal glands are unremarkable. Kidneys are normal, without renal calculi, focal lesion, or hydronephrosis. Bladder is unremarkable. Stomach/Bowel: Stomach is within normal limits. Post appendectomy. No evidence of bowel wall thickening, distention, or inflammatory changes. Vascular/Lymphatic: Aortic atherosclerosis, mild. No enlarged abdominal or pelvic lymph nodes. Shotty porta hepatis and upper abdominal retroperitoneal lymph nodes. Reproductive: Prostate is unremarkable. Other: No abdominal wall hernia or abnormality. Musculoskeletal: No acute or significant osseous findings. IMPRESSION: 1. Edematous appearance of the head/uncinate process of the pancreas with peripancreatic fat stranding and small amount of fluid tracking along the second and third portion of the duodenum. These findings are most consistent with acute pancreatitis. No evidence of pseudocyst formation. 2. Hepatic steatosis. 3. Shotty porta hepatis and upper abdominal retroperitoneal lymph nodes, likely reactive. Electronically Signed   By: Ted Mcalpine M.D.   On: 07/04/2019 13:58   Dg Chest Port 1 View  Result Date: 07/04/2019 CLINICAL DATA:  Leukocytosis EXAM: PORTABLE CHEST 1 VIEW COMPARISON:  09/08/2011 FINDINGS: The heart size and mediastinal contours are within normal limits. Both lungs are clear. The visualized skeletal structures are unremarkable. IMPRESSION: Normal chest Electronically Signed   By: Charlett Nose M.D.   On: 07/04/2019 15:59    Lab Data:  CBC: Recent Labs  Lab 07/04/19 1105 07/05/19 0434  WBC 15.9* 14.9*  NEUTROABS 13.4*  --   HGB 18.3* 17.5*  HCT 53.9* 51.6  MCV 97.6 99.0  PLT 196 172   Basic Metabolic Panel: Recent Labs  Lab 07/04/19  1105 07/05/19 0434  NA 137 134*  K 3.6 3.8  CL 96* 96*  CO2 21* 23  GLUCOSE 173* 128*  BUN <5* <5*  CREATININE 0.73 0.58*  CALCIUM 9.4 8.3*   GFR: CrCl cannot be calculated (Unknown ideal weight.). Liver Function Tests: Recent Labs  Lab 07/04/19 1105 07/05/19 0434  AST 98* 53*  ALT 58* 43  ALKPHOS 63 55  BILITOT 0.9 1.0  PROT 8.3* 7.0  ALBUMIN 4.8 4.0   Recent Labs  Lab 07/04/19 1105 07/05/19 0434  LIPASE 819* 390*   No results for input(s): AMMONIA in the last 168 hours. Coagulation Profile: No results for input(s): INR, PROTIME in the last 168 hours. Cardiac Enzymes: No results for input(s): CKTOTAL, CKMB, CKMBINDEX, TROPONINI in the last 168 hours. BNP (last 3 results) No results for input(s): PROBNP in the last 8760 hours. HbA1C: No results for  input(s): HGBA1C in the last 72 hours. CBG: No results for input(s): GLUCAP in the last 168 hours. Lipid Profile: Recent Labs    07/05/19 0715  CHOL 162  HDL 46  LDLCALC 66  TRIG 252*  CHOLHDL 3.5   Thyroid Function Tests: No results for input(s): TSH, T4TOTAL, FREET4, T3FREE, THYROIDAB in the last 72 hours. Anemia Panel: No results for input(s): VITAMINB12, FOLATE, FERRITIN, TIBC, IRON, RETICCTPCT in the last 72 hours. Urine analysis:    Component Value Date/Time   COLORURINE YELLOW 07/04/2019 1439   APPEARANCEUR CLEAR 07/04/2019 1439   LABSPEC 1.041 (H) 07/04/2019 1439   PHURINE 5.0 07/04/2019 1439   GLUCOSEU NEGATIVE 07/04/2019 1439   HGBUR NEGATIVE 07/04/2019 1439   BILIRUBINUR NEGATIVE 07/04/2019 1439   KETONESUR 20 (A) 07/04/2019 1439   PROTEINUR NEGATIVE 07/04/2019 1439   UROBILINOGEN 1.0 01/13/2008 2059   NITRITE NEGATIVE 07/04/2019 1439   LEUKOCYTESUR NEGATIVE 07/04/2019 1439     Anila Bojarski M.D. [facility] 07/05/2019, 11:24 AM  Pager: [phone] Between 7am to 7pm - call Pager - [phone]  After 7pm go to [email or web address] - password [email or web address]  Call night coverage person covering  after 7pm

## 2019-07-06 LAB — BASIC METABOLIC PANEL
Anion gap: 12 (ref 5–15)
BUN: 6 mg/dL (ref 6–20)
CO2: 26 mmol/L (ref 22–32)
Calcium: 7.9 mg/dL — ABNORMAL LOW (ref 8.9–10.3)
Chloride: 94 mmol/L — ABNORMAL LOW (ref 98–111)
Creatinine, Ser: 0.58 mg/dL — ABNORMAL LOW (ref 0.61–1.24)
GFR calc Af Amer: >60 mL/min (ref >60)
GFR calc non Af Amer: >60 mL/min (ref >60)
Glucose, Bld: 107 mg/dL — ABNORMAL HIGH (ref 70–99)
Potassium: 3.2 mmol/L — ABNORMAL LOW (ref 3.5–5.1)
Sodium: 132 mmol/L — ABNORMAL LOW (ref 135–145)

## 2019-07-06 LAB — LIPASE, BLOOD: Lipase: 140 U/L — ABNORMAL HIGH (ref 11–51)

## 2019-07-06 MED ORDER — POTASSIUM CHLORIDE CRYS ER 20 MEQ PO TBCR
40.0000 meq | EXTENDED_RELEASE_TABLET | Freq: Once | ORAL | Status: AC
  Administered 2019-07-06: 40 meq via ORAL
  Filled 2019-07-06: qty 2

## 2019-07-06 MED ORDER — GUAIFENESIN ER 600 MG PO TB12
600.0000 mg | ORAL_TABLET | Freq: Two times a day (BID) | ORAL | Status: DC
  Administered 2019-07-06 – 2019-07-08 (×5): 600 mg via ORAL
  Filled 2019-07-06 (×5): qty 1

## 2019-07-06 MED ORDER — ACETAMINOPHEN 325 MG PO TABS
650.0000 mg | ORAL_TABLET | Freq: Four times a day (QID) | ORAL | Status: DC | PRN
  Administered 2019-07-06: 650 mg via ORAL
  Filled 2019-07-06: qty 2

## 2019-07-06 MED ORDER — BENZONATATE 100 MG PO CAPS
100.0000 mg | ORAL_CAPSULE | Freq: Three times a day (TID) | ORAL | Status: DC
  Administered 2019-07-06 – 2019-07-08 (×7): 100 mg via ORAL
  Filled 2019-07-06 (×7): qty 1

## 2019-07-06 MED ORDER — GUAIFENESIN-DM 100-10 MG/5ML PO SYRP: 5.0000 mL | ORAL_SOLUTION | ORAL | Status: DC | PRN

## 2019-07-06 NOTE — Progress Notes (Signed)
[facility]                                                                              Patient Demographics  Paul Allen, is a 38 y.o. male, DOB - 13-Jul-1981, WUJ:[id number]  Admit date - 07/04/2019   Admitting Physician Briant Cedar, MD  Outpatient Primary MD for the patient is Patient, No Pcp Per  Outpatient specialists:   LOS - 2  days   Medical records reviewed and are as summarized below:    Chief Complaint  Patient presents with  . Abdominal Pain       Brief summary   Patient is a 38 year old male with history of alcoholic pancreatitis, chronic alcohol abuse presented to ED with right upper quadrant abdominal pain on the morning of admission.  Patient reported abdominal pain, right upper quadrant and radiating to epigastric region, with nausea and vomiting.  No hematemesis, melena or hematochezia. Patient reported drinking 7-8 beers daily, last on 7/15.  CT abdomen showed findings consistent with acute pancreatitis, gallbladder normal.   Assessment & Plan    Principal Problem:   Acute alcoholic pancreatitis -States was not able to tolerate clears, felt more nauseous and abdominal pain -Continue IV fluid hydration and continue clears, no advancing diet for now -Lipase improving 819->390->140  -Continue pain control, antiemetics -Triglycerides 252.  CT abdomen did not show any cholelithiasis or cholecystitis.  Active Problems: Elevated transaminases -Likely due to alcohol abuse, LFTs improving  Alcohol abuse -Counseled strongly on alcohol cessation, currently not in acute withdrawals -High risk of rapid deterioration with alcohol withdrawals, drinks a 7-8 beers every day, last drink on 7/15 -Continue CIWA protocol with Ativan, IV fluids, folic acid, thiamine  Hypokalemia Replaced  Fever Patient spiked one episode of fever last night, 100.4 F, no leukocytosis, has some coughing Placed on Mucinex, Tessalon Perles, Robitussin.  Monitor off antibiotics for now   Code Status: Full CODE STATUS DVT Prophylaxis: Lovenox Family Communication: Discussed in detail with the patient, all imaging results, lab results explained to the patient    Disposition Plan: Possible DC home in 1 or 2 days once tolerating solids and pain controlled  Time Spent in minutes 25 minutes  Procedures:  None  Consultants:   None  Antimicrobials:   Anti-infectives (From admission, onward)   None         Medications  Scheduled Meds: . benzonatate  100 mg Oral TID  . enoxaparin (LOVENOX) injection  40 mg Subcutaneous Q24H  . folic acid  1 mg Oral Daily  . guaiFENesin  600 mg Oral BID  . multivitamin with minerals  1 tablet Oral Daily  . nicotine  21 mg Transdermal Daily  . thiamine  100 mg Oral Daily   Or  . thiamine  100 mg Intravenous Daily   Continuous Infusions: . sodium chloride 125 mL/hr at 07/06/19 1148   PRN Meds:.acetaminophen, guaiFENesin-dextromethorphan, hydrALAZINE, HYDROmorphone (DILAUDID) injection, LORazepam **OR** LORazepam, ondansetron **OR** ondansetron (ZOFRAN) IV, oxyCODONE, polyethylene glycol      Subjective:   Cloud Oberbroeckling was seen and examined today.  Overnight one episode of low-grade temp 100.4 F.  Per patient  was not able to tolerate clears, felt worse with nausea and abdominal pain.   Patient denies dizziness, chest pain, shortness of breath,  new weakness, numbess, tingling.   Objective:   Vitals:   07/05/19 1300 07/05/19 1804 07/06/19 0005 07/06/19 0457  BP: (!) 143/93 (!) 146/89 137/82 (!) 143/94  Pulse: 74 86 94 88  Resp: 18 18 20 20   Temp: 98.4 F (36.9 C) 99.1 F (37.3 C) (!) 100.4 F (38 C) 99 F (37.2 C)  TempSrc: Oral Oral Oral Oral  SpO2: 97% 95% 95% 96%    Intake/Output Summary (Last 24 hours) at 07/06/2019 1357 Last data filed at 07/06/2019 1000 Gross per 24 hour  Intake 4330.98 ml  Output 925 ml  Net 3405.98 ml     Wt Readings from Last 3 Encounters:   10/24/18 78 kg  05/13/18 77.1 kg     Physical Exam  General: Alert and oriented x 3, NAD  Eyes:   HEENT:  Atraumatic, normocephalic  Cardiovascular: S1 S2 clear,RRR. No pedal edema b/l  Respiratory: CTAB, no wheezing, rales or rhonchi  Gastrointestinal: Soft, epigastric TTP, nondistended, NBS  Ext: no pedal edema bilaterally  Neuro: no new deficits  Musculoskeletal: No cyanosis, clubbing  Skin: No rashes  Psych: Normal affect and demeanor, alert and oriented x3     Data Reviewed:  I have personally reviewed following labs and imaging studies  Micro Results Recent Results (from the past 240 hour(s))  SARS Coronavirus 2 (CEPHEID - Performed in [facility] hospital lab), Hosp Order     Status: None   Collection Time: 07/04/19  1:19 PM   Specimen: Nasopharyngeal Swab  Result Value Ref Range Status   SARS Coronavirus 2 NEGATIVE NEGATIVE Final    Comment: (NOTE) If result is NEGATIVE SARS-CoV-2 target nucleic acids are NOT DETECTED. The SARS-CoV-2 RNA is generally detectable in upper and lower  respiratory specimens during the acute phase of infection. The lowest  concentration of SARS-CoV-2 viral copies this assay can detect is 250  copies / mL. A negative result does not preclude SARS-CoV-2 infection  and should not be used as the sole basis for treatment or other  patient management decisions.  A negative result may occur with  improper specimen collection / handling, submission of specimen other  than nasopharyngeal swab, presence of viral mutation(s) within the  areas targeted by this assay, and inadequate number of viral copies  (<250 copies / mL). A negative result must be combined with clinical  observations, patient history, and epidemiological information. If result is POSITIVE SARS-CoV-2 target nucleic acids are DETECTED. The SARS-CoV-2 RNA is generally detectable in upper and lower  respiratory specimens dur ing the acute phase of infection.  Positive   results are indicative of active infection with SARS-CoV-2.  Clinical  correlation with patient history and other diagnostic information is  necessary to determine patient infection status.  Positive results do  not rule out bacterial infection or co-infection with other viruses. If result is PRESUMPTIVE POSTIVE SARS-CoV-2 nucleic acids MAY BE PRESENT.   A presumptive positive result was obtained on the submitted specimen  and confirmed on repeat testing.  While 2019 novel coronavirus  (SARS-CoV-2) nucleic acids may be present in the submitted sample  additional confirmatory testing may be necessary for epidemiological  and / or clinical management purposes  to differentiate between  SARS-CoV-2 and other Sarbecovirus currently known to infect humans.  If clinically indicated additional testing with an alternate test  methodology [phone]) is  advised. The SARS-CoV-2 RNA is generally  detectable in upper and lower respiratory sp ecimens during the acute  phase of infection. The expected result is Negative. Fact Sheet for Patients:  BoilerBrush.com.cy Fact Sheet for Healthcare Providers: https://pope.com/ This test is not yet approved or cleared by the [place] FDA and has been authorized for detection and/or diagnosis of SARS-CoV-2 by FDA under an Emergency Use Authorization (EUA).  This EUA will remain in effect (meaning this test can be used) for the duration of the COVID-19 declaration under Section 564(b)(1) of the Act, 21 U.S.C. section 360bbb-3(b)(1), unless the authorization is terminated or revoked sooner. Performed at [facility], [address] [place], [place] [address]     Radiology Reports Ct Abdomen Pelvis W Contrast  Result Date: 07/04/2019 CLINICAL DATA:  Nausea vomiting and right upper quadrant pain. EXAM: CT ABDOMEN AND PELVIS WITH CONTRAST TECHNIQUE: Multidetector CT imaging of the abdomen  and pelvis was performed using the standard protocol following bolus administration of intravenous contrast. CONTRAST:  OMNIPAQUE IOHEXOL 300 MG/ML  SOLN COMPARISON:  October 24, 2018 FINDINGS: Lower chest: No acute abnormality. Hepatobiliary: Hepatic steatosis. Normal appearance of the gallbladder. Pancreas: Edematous appearance of the head/uncinate process of the pancreas with peripancreatic fat stranding and small amount of fluid tracking along the second and third portion of the duodenum. Spleen: Normal in size without focal abnormality. Adrenals/Urinary Tract: Adrenal glands are unremarkable. Kidneys are normal, without renal calculi, focal lesion, or hydronephrosis. Bladder is unremarkable. Stomach/Bowel: Stomach is within normal limits. Post appendectomy. No evidence of bowel wall thickening, distention, or inflammatory changes. Vascular/Lymphatic: Aortic atherosclerosis, mild. No enlarged abdominal or pelvic lymph nodes. Shotty porta hepatis and upper abdominal retroperitoneal lymph nodes. Reproductive: Prostate is unremarkable. Other: No abdominal wall hernia or abnormality. Musculoskeletal: No acute or significant osseous findings. IMPRESSION: 1. Edematous appearance of the head/uncinate process of the pancreas with peripancreatic fat stranding and small amount of fluid tracking along the second and third portion of the duodenum. These findings are most consistent with acute pancreatitis. No evidence of pseudocyst formation. 2. Hepatic steatosis. 3. Shotty porta hepatis and upper abdominal retroperitoneal lymph nodes, likely reactive. Electronically Signed   By: Ted Mcalpine M.D.   On: 07/04/2019 13:58   Dg Chest Port 1 View  Result Date: 07/04/2019 CLINICAL DATA:  Leukocytosis EXAM: PORTABLE CHEST 1 VIEW COMPARISON:  09/08/2011 FINDINGS: The heart size and mediastinal contours are within normal limits. Both lungs are clear. The visualized skeletal structures are unremarkable. IMPRESSION:  Normal chest Electronically Signed   By: Charlett Nose M.D.   On: 07/04/2019 15:59    Lab Data:  CBC: Recent Labs  Lab 07/04/19 1105 07/05/19 0434  WBC 15.9* 14.9*  NEUTROABS 13.4*  --   HGB 18.3* 17.5*  HCT 53.9* 51.6  MCV 97.6 99.0  PLT 196 172   Basic Metabolic Panel: Recent Labs  Lab 07/04/19 1105 07/05/19 0434 07/06/19 0603  NA 137 134* 132*  K 3.6 3.8 3.2*  CL 96* 96* 94*  CO2 21* 23 26  GLUCOSE 173* 128* 107*  BUN <5* <5* 6  CREATININE 0.73 0.58* 0.58*  CALCIUM 9.4 8.3* 7.9*   GFR: CrCl cannot be calculated (Unknown ideal weight.). Liver Function Tests: Recent Labs  Lab 07/04/19 1105 07/05/19 0434  AST 98* 53*  ALT 58* 43  ALKPHOS 63 55  BILITOT 0.9 1.0  PROT 8.3* 7.0  ALBUMIN 4.8 4.0   Recent Labs  Lab 07/04/19 1105 07/05/19 0434 07/06/19  0603  LIPASE 819* 390* 140*   No results for input(s): AMMONIA in the last 168 hours. Coagulation Profile: No results for input(s): INR, PROTIME in the last 168 hours. Cardiac Enzymes: No results for input(s): CKTOTAL, CKMB, CKMBINDEX, TROPONINI in the last 168 hours. BNP (last 3 results) No results for input(s): PROBNP in the last 8760 hours. HbA1C: No results for input(s): HGBA1C in the last 72 hours. CBG: No results for input(s): GLUCAP in the last 168 hours. Lipid Profile: Recent Labs    07/05/19 0715  CHOL 162  HDL 46  LDLCALC 66  TRIG 252*  CHOLHDL 3.5   Thyroid Function Tests: No results for input(s): TSH, T4TOTAL, FREET4, T3FREE, THYROIDAB in the last 72 hours. Anemia Panel: No results for input(s): VITAMINB12, FOLATE, FERRITIN, TIBC, IRON, RETICCTPCT in the last 72 hours. Urine analysis:    Component Value Date/Time   COLORURINE AMBER (A) 07/05/2019 1843   APPEARANCEUR CLEAR 07/05/2019 1843   LABSPEC 1.015 07/05/2019 1843   PHURINE 6.0 07/05/2019 1843   GLUCOSEU NEGATIVE 07/05/2019 1843   HGBUR SMALL (A) 07/05/2019 1843   BILIRUBINUR NEGATIVE 07/05/2019 1843   KETONESUR 5 (A)  07/05/2019 1843   PROTEINUR NEGATIVE 07/05/2019 1843   UROBILINOGEN 1.0 01/13/2008 2059   NITRITE NEGATIVE 07/05/2019 1843   LEUKOCYTESUR NEGATIVE 07/05/2019 1843     Bernhard Koskinen M.D. [facility] 07/06/2019, 1:57 PM  Pager: [phone] Between 7am to 7pm - call Pager - [phone]  After 7pm go to [email or web address] - password [email or web address]  Call night coverage person covering after 7pm

## 2019-07-06 NOTE — Progress Notes (Addendum)
Pt has developed fever of 100.4. He says he does not feel feverish. He has no tylenol prescribed so reached out to clinician Bodenheimer. Tylenol given will re assess and monitor

## 2019-07-07 LAB — BASIC METABOLIC PANEL
Anion gap: 13 (ref 5–15)
BUN: 6 mg/dL (ref 6–20)
CO2: 23 mmol/L (ref 22–32)
Calcium: 8 mg/dL — ABNORMAL LOW (ref 8.9–10.3)
Chloride: 95 mmol/L — ABNORMAL LOW (ref 98–111)
Creatinine, Ser: 0.55 mg/dL — ABNORMAL LOW (ref 0.61–1.24)
GFR calc Af Amer: >60 mL/min (ref >60)
GFR calc non Af Amer: >60 mL/min (ref >60)
Glucose, Bld: 102 mg/dL — ABNORMAL HIGH (ref 70–99)
Potassium: 3.2 mmol/L — ABNORMAL LOW (ref 3.5–5.1)
Sodium: 131 mmol/L — ABNORMAL LOW (ref 135–145)

## 2019-07-07 LAB — URINE CULTURE: Culture: <10000 — AB

## 2019-07-07 LAB — LIPASE, BLOOD: Lipase: 61 U/L — ABNORMAL HIGH (ref 11–51)

## 2019-07-07 MED ORDER — POTASSIUM CHLORIDE CRYS ER 20 MEQ PO TBCR
40.0000 meq | EXTENDED_RELEASE_TABLET | Freq: Once | ORAL | Status: AC
  Administered 2019-07-07: 40 meq via ORAL
  Filled 2019-07-07: qty 2

## 2019-07-07 MED ORDER — HYDROMORPHONE HCL 1 MG/ML IJ SOLN
1.0000 mg | Freq: Four times a day (QID) | INTRAMUSCULAR | Status: DC | PRN
  Administered 2019-07-07 – 2019-07-08 (×4): 1 mg via INTRAVENOUS
  Filled 2019-07-07 (×4): qty 1

## 2019-07-07 NOTE — Progress Notes (Signed)
[facility]                                                                              Patient Demographics  Paul Allen, is a 38 y.o. male, DOB - 1981-07-09, NWG:[id number]  Admit date - 07/04/2019   Admitting Physician Briant Cedar, MD  Outpatient Primary MD for the patient is Patient, No Pcp Per  Outpatient specialists:   LOS - 3  days   Medical records reviewed and are as summarized below:    Chief Complaint  Patient presents with   Abdominal Pain       Brief summary   Patient is a 38 year old male with history of alcoholic pancreatitis, chronic alcohol abuse presented to ED with right upper quadrant abdominal pain on the morning of admission.  Patient reported abdominal pain, right upper quadrant and radiating to epigastric region, with nausea and vomiting.  No hematemesis, melena or hematochezia. Patient reported drinking 7-8 beers daily, last on 7/15.  CT abdomen showed findings consistent with acute pancreatitis, gallbladder normal.   Assessment & Plan    Principal Problem:   Acute alcoholic pancreatitis --Continue IV fluid hydration and pain control, decrease IV Dilaudid -Lipase improving 819->390->140 -> 61 -Continue pain control, antiemetics -Triglycerides 252.  CT abdomen did not show any cholelithiasis or cholecystitis. -Diet advanced to full liquids, counseled patient to cut down on IV pain medications, monitor for drug-seeking behavior  Active Problems: Elevated transaminases -Likely due to alcohol abuse, LFTs improving  Alcohol abuse -Counseled strongly on alcohol cessation, currently not in acute withdrawals -High risk of rapid deterioration with alcohol withdrawals, drinks a 7-8 beers every day, last drink on 7/15 -Continue CIWA protocol with Ativan, IV fluids, folic acid, thiamine  Hypokalemia Replace  Fever No further episodes of fever   Code Status: Full CODE STATUS DVT Prophylaxis: Lovenox Family  Communication: Discussed in detail with the patient, all imaging results, lab results explained to the patient    Disposition Plan: DC home in a.m., will advance diet to soft solids in a.m.  Time Spent in minutes 25 minutes  Procedures:  None  Consultants:   None  Antimicrobials:   Anti-infectives (From admission, onward)   None         Medications  Scheduled Meds:  benzonatate  100 mg Oral TID   enoxaparin (LOVENOX) injection  40 mg Subcutaneous Q24H   folic acid  1 mg Oral Daily   guaiFENesin  600 mg Oral BID   multivitamin with minerals  1 tablet Oral Daily   nicotine  21 mg Transdermal Daily   thiamine  100 mg Oral Daily   Or   thiamine  100 mg Intravenous Daily   Continuous Infusions:  sodium chloride 125 mL/hr at 07/07/19 0600   PRN Meds:.acetaminophen, guaiFENesin-dextromethorphan, hydrALAZINE, HYDROmorphone (DILAUDID) injection, LORazepam **OR** LORazepam, ondansetron **OR** ondansetron (ZOFRAN) IV, oxyCODONE, polyethylene glycol      Subjective:   Paul Allen was seen and examined today.  At the time of my examination, was ambulating in the room.  No fevers overnight.  States he still feels somewhat nauseous but was able to tolerate clears.  Diet  advanced today.  States abdomen feels a sore on the left and periumbilical area, 5/10.   Patient denies dizziness, chest pain, shortness of breath,  new weakness, numbess, tingling.   Objective:   Vitals:   07/06/19 1814 07/06/19 2009 07/07/19 0459 07/07/19 1352  BP: (!) 146/94 140/90 (!) 142/98 (!) 151/85  Pulse: 81 83 75 68  Resp: 16 18 18    Temp: 99.7 F (37.6 C) 99.6 F (37.6 C) 98.5 F (36.9 C) 98.6 F (37 C)  TempSrc: Oral Oral Oral Oral  SpO2: 96% 97% 96% 99%    Intake/Output Summary (Last 24 hours) at 07/07/2019 1433 Last data filed at 07/07/2019 1051 Gross per 24 hour  Intake 2505.61 ml  Output 1325 ml  Net 1180.61 ml     Wt Readings from Last 3 Encounters:  10/24/18 78 kg   05/13/18 77.1 kg   Physical Exam  General: Alert and oriented x 3, NAD  Eyes:   HEENT:  Atraumatic, normocephalic  Cardiovascular: S1 S2 clear, no murmurs, RRR. No pedal edema b/l  Respiratory: CTAB, no wheezing, rales or rhonchi  Gastrointestinal: Soft, mild diffuse TTP, voluntary guarding, nondistended, NBS  Ext: no pedal edema bilaterally  Neuro: no new deficits  Musculoskeletal: No cyanosis, clubbing  Skin: No rashes  Psych: Normal affect and demeanor, alert and oriented x3      Data Reviewed:  I have personally reviewed following labs and imaging studies  Micro Results Recent Results (from the past 240 hour(s))  SARS Coronavirus 2 (CEPHEID - Performed in [facility] hospital lab), Hosp Order     Status: None   Collection Time: 07/04/19  1:19 PM   Specimen: Nasopharyngeal Swab  Result Value Ref Range Status   SARS Coronavirus 2 NEGATIVE NEGATIVE Final    Comment: (NOTE) If result is NEGATIVE SARS-CoV-2 target nucleic acids are NOT DETECTED. The SARS-CoV-2 RNA is generally detectable in upper and lower  respiratory specimens during the acute phase of infection. The lowest  concentration of SARS-CoV-2 viral copies this assay can detect is 250  copies / mL. A negative result does not preclude SARS-CoV-2 infection  and should not be used as the sole basis for treatment or other  patient management decisions.  A negative result may occur with  improper specimen collection / handling, submission of specimen other  than nasopharyngeal swab, presence of viral mutation(s) within the  areas targeted by this assay, and inadequate number of viral copies  (<250 copies / mL). A negative result must be combined with clinical  observations, patient history, and epidemiological information. If result is POSITIVE SARS-CoV-2 target nucleic acids are DETECTED. The SARS-CoV-2 RNA is generally detectable in upper and lower  respiratory specimens dur ing the acute phase of  infection.  Positive  results are indicative of active infection with SARS-CoV-2.  Clinical  correlation with patient history and other diagnostic information is  necessary to determine patient infection status.  Positive results do  not rule out bacterial infection or co-infection with other viruses. If result is PRESUMPTIVE POSTIVE SARS-CoV-2 nucleic acids MAY BE PRESENT.   A presumptive positive result was obtained on the submitted specimen  and confirmed on repeat testing.  While 2019 novel coronavirus  (SARS-CoV-2) nucleic acids may be present in the submitted sample  additional confirmatory testing may be necessary for epidemiological  and / or clinical management purposes  to differentiate between  SARS-CoV-2 and other Sarbecovirus currently known to infect humans.  If clinically indicated additional testing with an  alternate test  methodology [phone]) is advised. The SARS-CoV-2 RNA is generally  detectable in upper and lower respiratory sp ecimens during the acute  phase of infection. The expected result is Negative. Fact Sheet for Patients:  BoilerBrush.com.cy Fact Sheet for Healthcare Providers: https://pope.com/ This test is not yet approved or cleared by the [place] FDA and has been authorized for detection and/or diagnosis of SARS-CoV-2 by FDA under an Emergency Use Authorization (EUA).  This EUA will remain in effect (meaning this test can be used) for the duration of the COVID-19 declaration under Section 564(b)(1) of the Act, 21 U.S.C. section 360bbb-3(b)(1), unless the authorization is terminated or revoked sooner. Performed at [facility], [address], [place], [place] [address]   Culture, Urine     Status: Abnormal   Collection Time: 07/05/19  6:43 PM   Specimen: Urine, Random  Result Value Ref Range Status   Specimen Description   Final    URINE, RANDOM Performed at [facility], [address], [place], [place] [address]    Special Requests   Final    NONE Performed at [facility], [address], [place], [place] [address]    Culture (A)  Final    <10,000 COLONIES/mL INSIGNIFICANT GROWTH Performed at [facility], [address], [place], [place] [address]    Report Status 07/07/2019 FINAL  Final    Radiology Reports Ct Abdomen Pelvis W Contrast  Result Date: 07/04/2019 CLINICAL DATA:  Nausea vomiting and right upper quadrant pain. EXAM: CT ABDOMEN AND PELVIS WITH CONTRAST TECHNIQUE: Multidetector CT imaging of the abdomen and pelvis was performed using the standard protocol following bolus administration of intravenous contrast. CONTRAST:  OMNIPAQUE IOHEXOL 300 MG/ML  SOLN COMPARISON:  October 24, 2018 FINDINGS: Lower chest: No acute abnormality. Hepatobiliary: Hepatic steatosis. Normal appearance of the gallbladder. Pancreas: Edematous appearance of the head/uncinate process of the pancreas with peripancreatic fat stranding and small amount of fluid tracking along the second and third portion of the duodenum. Spleen: Normal in size without focal abnormality. Adrenals/Urinary Tract: Adrenal glands are unremarkable. Kidneys are normal, without renal calculi, focal lesion, or hydronephrosis. Bladder is unremarkable. Stomach/Bowel: Stomach is within normal limits. Post appendectomy. No evidence of bowel wall thickening, distention, or inflammatory changes. Vascular/Lymphatic: Aortic atherosclerosis, mild. No enlarged abdominal or pelvic lymph nodes. Shotty porta hepatis and upper abdominal retroperitoneal lymph nodes. Reproductive: Prostate is unremarkable. Other: No abdominal wall hernia or abnormality. Musculoskeletal: No acute or significant osseous findings. IMPRESSION: 1. Edematous appearance of the head/uncinate process of the pancreas with peripancreatic fat stranding and small amount of fluid tracking along the  second and third portion of the duodenum. These findings are most consistent with acute pancreatitis. No evidence of pseudocyst formation. 2. Hepatic steatosis. 3. Shotty porta hepatis and upper abdominal retroperitoneal lymph nodes, likely reactive. Electronically Signed   By: Ted Mcalpine M.D.   On: 07/04/2019 13:58   Dg Chest Port 1 View  Result Date: 07/04/2019 CLINICAL DATA:  Leukocytosis EXAM: PORTABLE CHEST 1 VIEW COMPARISON:  09/08/2011 FINDINGS: The heart size and mediastinal contours are within normal limits. Both lungs are clear. The visualized skeletal structures are unremarkable. IMPRESSION: Normal chest Electronically Signed   By: Charlett Nose M.D.   On: 07/04/2019 15:59    Lab Data:  CBC: Recent Labs  Lab 07/04/19 1105 07/05/19 0434  WBC 15.9* 14.9*  NEUTROABS 13.4*  --   HGB 18.3* 17.5*  HCT 53.9*  51.6  MCV 97.6 99.0  PLT 196 172   Basic Metabolic Panel: Recent Labs  Lab 07/04/19 1105 07/05/19 0434 07/06/19 0603 07/07/19 0529  NA 137 134* 132* 131*  K 3.6 3.8 3.2* 3.2*  CL 96* 96* 94* 95*  CO2 21* 23 26 23   GLUCOSE 173* 128* 107* 102*  BUN <5* <5* 6 6  CREATININE 0.73 0.58* 0.58* 0.55*  CALCIUM 9.4 8.3* 7.9* 8.0*   GFR: CrCl cannot be calculated (Unknown ideal weight.). Liver Function Tests: Recent Labs  Lab 07/04/19 1105 07/05/19 0434  AST 98* 53*  ALT 58* 43  ALKPHOS 63 55  BILITOT 0.9 1.0  PROT 8.3* 7.0  ALBUMIN 4.8 4.0   Recent Labs  Lab 07/04/19 1105 07/05/19 0434 07/06/19 0603 07/07/19 0529  LIPASE 819* 390* 140* 61*   No results for input(s): AMMONIA in the last 168 hours. Coagulation Profile: No results for input(s): INR, PROTIME in the last 168 hours. Cardiac Enzymes: No results for input(s): CKTOTAL, CKMB, CKMBINDEX, TROPONINI in the last 168 hours. BNP (last 3 results) No results for input(s): PROBNP in the last 8760 hours. HbA1C: No results for input(s): HGBA1C in the last 72 hours. CBG: No results for input(s):  GLUCAP in the last 168 hours. Lipid Profile: Recent Labs    07/05/19 0715  CHOL 162  HDL 46  LDLCALC 66  TRIG 252*  CHOLHDL 3.5   Thyroid Function Tests: No results for input(s): TSH, T4TOTAL, FREET4, T3FREE, THYROIDAB in the last 72 hours. Anemia Panel: No results for input(s): VITAMINB12, FOLATE, FERRITIN, TIBC, IRON, RETICCTPCT in the last 72 hours. Urine analysis:    Component Value Date/Time   COLORURINE AMBER (A) 07/05/2019 1843   APPEARANCEUR CLEAR 07/05/2019 1843   LABSPEC 1.015 07/05/2019 1843   PHURINE 6.0 07/05/2019 1843   GLUCOSEU NEGATIVE 07/05/2019 1843   HGBUR SMALL (A) 07/05/2019 1843   BILIRUBINUR NEGATIVE 07/05/2019 1843   KETONESUR 5 (A) 07/05/2019 1843   PROTEINUR NEGATIVE 07/05/2019 1843   UROBILINOGEN 1.0 01/13/2008 2059   NITRITE NEGATIVE 07/05/2019 1843   LEUKOCYTESUR NEGATIVE 07/05/2019 1843     Layli Capshaw M.D. [facility] 07/07/2019, 2:33 PM  Pager: [phone] Between 7am to 7pm - call Pager - [phone]  After 7pm go to [email or web address] - password [email or web address]  Call night coverage person covering after 7pm

## 2019-07-08 DIAGNOSIS — K8521 Alcohol induced acute pancreatitis with uninfected necrosis: Secondary | ICD-10-CM

## 2019-07-08 LAB — LIPASE, BLOOD: Lipase: 57 U/L — ABNORMAL HIGH (ref 11–51)

## 2019-07-08 LAB — BASIC METABOLIC PANEL
Anion gap: 10 (ref 5–15)
BUN: 6 mg/dL (ref 6–20)
CO2: 26 mmol/L (ref 22–32)
Calcium: 8.1 mg/dL — ABNORMAL LOW (ref 8.9–10.3)
Chloride: 96 mmol/L — ABNORMAL LOW (ref 98–111)
Creatinine, Ser: 0.48 mg/dL — ABNORMAL LOW (ref 0.61–1.24)
GFR calc Af Amer: >60 mL/min (ref >60)
GFR calc non Af Amer: >60 mL/min (ref >60)
Glucose, Bld: 90 mg/dL (ref 70–99)
Potassium: 3 mmol/L — ABNORMAL LOW (ref 3.5–5.1)
Sodium: 132 mmol/L — ABNORMAL LOW (ref 135–145)

## 2019-07-08 MED ORDER — ONDANSETRON 4 MG PO TBDP: 4.0000 mg | ORAL_TABLET | Freq: Three times a day (TID) | ORAL | 0 refills | Status: DC | PRN

## 2019-07-08 MED ORDER — BENZONATATE 100 MG PO CAPS: 100.0000 mg | ORAL_CAPSULE | Freq: Three times a day (TID) | ORAL | 0 refills | Status: DC | PRN

## 2019-07-08 MED ORDER — NICOTINE 21 MG/24HR TD PT24: 21.0000 mg | MEDICATED_PATCH | Freq: Every day | TRANSDERMAL | 0 refills | Status: DC

## 2019-07-08 MED ORDER — POTASSIUM CHLORIDE CRYS ER 20 MEQ PO TBCR
40.0000 meq | EXTENDED_RELEASE_TABLET | Freq: Once | ORAL | Status: AC
  Administered 2019-07-08: 40 meq via ORAL
  Filled 2019-07-08: qty 2

## 2019-07-08 MED ORDER — TRAMADOL HCL 50 MG PO TABS: 50.0000 mg | ORAL_TABLET | Freq: Three times a day (TID) | ORAL | 0 refills | Status: DC | PRN

## 2019-07-08 MED ORDER — THIAMINE HCL 100 MG PO TABS: 100.0000 mg | ORAL_TABLET | Freq: Every day | ORAL | 3 refills | Status: DC

## 2019-07-08 NOTE — Progress Notes (Signed)
Discharge instructions explained to patient, prescriptions called in to pharmacy. Patient denies having any questions, he refused a wheelchair and was walked out to front door with a tech. Mother here to bring him home.

## 2019-07-08 NOTE — Discharge Instructions (Signed)
Acute Pancreatitis ° °Acute pancreatitis happens when the pancreas gets swollen. The pancreas is a large gland in the body that helps to control blood sugar. It also makes enzymes that help to digest food. °This condition can last a few days and cause serious problems. The lungs, heart, and kidneys may stop working. °What are the causes? °Causes include: °· Alcohol abuse. °· Drug abuse. °· Gallstones. °· A tumor in the pancreas. °Other causes include: °· Some medicines. °· Some chemicals. °· Diabetes. °· An infection. °· Damage caused by an accident. °· The poison (venom) from a scorpion bite. °· Belly (abdominal) surgery. °· The body's defense system (immune system) attacking the pancreas (autoimmune pancreatitis). °· Genes that are passed from parent to child (inherited). °In some cases, the cause is not known. °What are the signs or symptoms? °· Pain in the upper belly that may be felt in the back. The pain may be very bad. °· Swelling of the belly. °· Feeling sick to your stomach (nauseous) and throwing up (vomiting). °· Fever. °How is this treated? °You will likely have to stay in the hospital. Treatment may include: °· Pain medicine. °· Fluid through an IV tube. °· Placing a tube in the stomach to take out the stomach contents. This may help you stop throwing up. °· Not eating for 3-4 days. °· Antibiotic medicines, if you have an infection. °· Treating any other problems that may be the cause. °· Steroid medicines, if your problem is caused by your defense system attacking your body's own tissues. °· Surgery. °Follow these instructions at home: °Eating and drinking ° °· Follow instructions from your doctor about what to eat and drink. °· Eat foods that do not have a lot of fat in them. °· Eat small meals often. Do not eat big meals. °· Drink enough fluid to keep your pee (urine) pale yellow. °· Do not drink alcohol if it caused your condition. °Medicines °· Take over-the-counter and prescription medicines only  as told by your doctor. °· Ask your doctor if the medicine prescribed to you: °? Requires you to avoid driving or using heavy machinery. °? Can cause trouble pooping (constipation). You may need to take steps to prevent or treat trouble pooping: °§ Take over-the-counter or prescription medicines. °§ Eat foods that are high in fiber. These include beans, whole grains, and fresh fruits and vegetables. °§ Limit foods that are high in fat and sugar. These include fried or sweet foods. °General instructions °· Do not use any products that contain nicotine or tobacco, such as cigarettes, e-cigarettes, and chewing tobacco. If you need help quitting, ask your doctor. °· Get plenty of rest. °· Check your blood sugar at home as told by your doctor. °· Keep all follow-up visits as told by your doctor. This is important. °Contact a doctor if: °· You do not get better as quickly as expected. °· You have new symptoms. °· Your symptoms get worse. °· You have pain or weakness that lasts a long time. °· You keep feeling sick to your stomach. °· You get better and then you have pain again. °· You have a fever. °Get help right away if: °· You cannot eat or keep fluids down. °· Your pain gets very bad. °· Your skin or the white part of your eyes turns yellow. °· You have sudden swelling in your belly. °· You throw up. °· You feel dizzy or you pass out (faint). °· Your blood sugar is high (over 300   mg/dL). Summary  Acute pancreatitis happens when the pancreas gets swollen.  This condition is often caused by alcohol abuse, drug abuse, or gallstones.  You will likely have to stay in the hospital for treatment. This information is not intended to replace advice given to you by your health care provider. Make sure you discuss any questions you have with your health care provider. Document Released: 05/23/2008 Document Revised: 09/24/2018 Document Reviewed: 09/24/2018 Elsevier Patient Education  [place].   Chronic  Pancreatitis  Chronic pancreatitis is long-lasting inflammation and scarring of the pancreas. The pancreas is a gland that is located behind the stomach. It makes enzymes that help to digest food. The pancreas also releases hormones called glucagon and insulin, which help regulate blood sugar (glucose). Damage to the pancreas may affect digestion, cause pain in the upper abdomen and back, and cause diabetes. Inflammation can also irritate other organs in the abdomen near the pancreas. At first, pancreatitis may be sudden (acute). If you have several or prolonged episodes of acute pancreatitis, the condition can turn into chronic pancreatitis. What are the causes? The most common cause of this condition is alcohol abuse. Other causes include:  High (elevated) levels of triglycerides in the blood (hypertriglyceridemia).  Gallstones or other conditions that can block the tube that drains the pancreas (pancreatic duct).  Pancreatic cancer.  Cystic fibrosis.  Too much calcium in the blood (hypercalcemia), which may be caused by an overactive parathyroid gland (hyperparathyroidism).  Certain medicines.  Injury to the pancreas.  Infection.  Autoimmune pancreatitis. This is when the body's disease-fighting (immune) system attacks the pancreas.  Genes that are passed from parent to child (inherited). In some cases, the cause may not be known. What increases the risk? This condition is more likely to develop in:  Men.  People who are 22-20 years old.  People who have a family history of pancreatitis.  People who smoke tobacco.  People who drink large amounts of alcohol over a long period of time. What are the signs or symptoms? Symptoms of this condition may include:  Pain in the abdomen or upper back. Pain may get worse after eating.  Nausea and vomiting.  Fever.  Weight loss.  A change in the color and consistency of bowel movements, such as stools that are oily, fatty, or  clay-colored. How is this diagnosed? This condition is diagnosed based on your symptoms, your medical history, and a physical exam. You may have tests, such as:  Blood tests.  Stool samples.  Biopsy of the pancreas. This is the removal of a small amount of pancreas tissue to be tested in a lab.  Imaging tests, such as: ? X-rays. ? CT scan. ? MRI. ? Ultrasound. How is this treated? You may need to be treated at a hospital. Treatment may involve:  Resting the pancreas. You may need to stop eating and drinking for a few days to give your pancreas time to recover. During this time, you will be given IV fluids to keep you hydrated.  Controlling pain. You may be given pain medicines by mouth (orally) or as injections.  Improving digestion. You may be given: ? Medicines to replace your pancreatic enzymes. ? Vitamin supplements. ? A specific diet to follow. You may work with a diet and nutrition specialist (dietitian) to make an eating plan.  Surgery to: ? Clear the pancreatic ducts of any blockages, such as gallstones. ? Remove any fluid or damaged tissue from the pancreas. Other treatments may include:  Preventing diabetes. Your health care provider may recommend that you: ? Get regular screening tests for diabetes. ? Monitor your blood glucose regularly.  Lifestyle changes, such as stopping alcohol use.  Steroid medicines, if your condition is caused by your immune system attacking your body's own tissues (autoimmune disease). Follow these instructions at home: Eating and drinking      Do not drink alcohol. If you need help quitting, ask your health care provider.  Follow a diet as told by your health care provider or dietitian, if this applies. This may include: ? Limiting how much fat you eat. ? Eating smaller meals more often. ? Avoiding caffeine.  Drink enough fluid to keep your urine pale yellow. General instructions  Take over-the-counter and prescription  medicines only as told by your health care provider. These include vitamin supplements.  Do not drive or use heavy machinery while taking prescription pain medicine.  If you are taking prescription pain medicine, take actions to prevent or treat constipation. Your health care provider may recommend that you: ? Take an over-the-counter or prescription medicine for constipation. ? Eat foods that are high in fiber such as whole grains and beans. ? Limit foods that are high in fat and processed sugars, such as fried or sweet foods.  Do not use any products that contain nicotine or tobacco, such as cigarettes and e-cigarettes. If you need help quitting, ask your health care provider.  If recommended by your health care provider, monitor your blood glucose at home.  Keep all follow-up visits as told by your health care provider. This is important. Contact a health care provider if:  You have pain that does not get better with medicine.  You have a fever.  You have sudden weight loss. Get help right away if:  Your pain suddenly gets worse.  You have sudden swelling in your abdomen.  You start to vomit often.  You vomit blood.  You have diarrhea that does not go away.  You have blood in your stool.  You become confused or you have trouble thinking clearly. Summary  Chronic pancreatitis is long-lasting inflammation and scarring of the pancreas. Damage to the pancreas may affect digestion, cause pain in the upper abdomen and back, and cause diabetes. Inflammation can also irritate other organs in the abdomen near the pancreas.  Common causes of this condition are alcohol abuse, gallstones, high (elevated) levels of triglycerides, and certain medicines.  This condition is sometimes treated at a hospital and may involve resting the pancreas, controlling pain, replacing enzymes, and avoiding alcohol. This information is not intended to replace advice given to you by your health care  provider. Make sure you discuss any questions you have with your health care provider. Document Released: 01/01/2016 Document Revised: 09/24/2018 Document Reviewed: 08/04/2017 Elsevier Patient Education  2020 [employer]   Pancreatitis Eating Plan Pancreatitis is when your pancreas becomes irritated and swollen (inflamed). The pancreas is a small organ located behind your stomach. It helps your body digest food and regulate your blood sugar. Pancreatitis can affect how your body digests food, especially foods with fat. You may also have other symptoms such as abdominal pain or nausea. When you have pancreatitis, following a low-fat eating plan may help you manage symptoms and recover more quickly. Work with your health care provider or a diet and nutrition specialist (dietitian) to create an eating plan that is right for you. What are tips for following this plan? Reading food labels Use the information on  food labels to help keep track of how much fat you eat:  Check the serving size.  Look for the amount of total fat in grams (g) in one serving. ? Low-fat foods have 3 g of fat or less per serving. ? Fat-free foods have 0.5 g of fat or less per serving.  Keep track of how much fat you eat based on how many servings you eat. ? For example, if you eat two servings, the amount of fat you eat will be two times what is listed on the label. Shopping   Buy low-fat or nonfat foods, such as: ? Fresh, frozen, or canned fruits and vegetables. ? Grains, including pasta, bread, and rice. ? Lean meat, poultry, fish, and other protein foods. ? Low-fat or nonfat dairy.  Avoid buying bakery products and other sweets made with whole milk, butter, and eggs.  Avoid buying snack foods with added fat, such as anything with butter or cheese flavoring. Cooking  Remove skin from poultry, and remove extra fat from meat.  Limit the amount of fat and oil you use to 6 teaspoons or less per day.  Cook  using low-fat methods, such as boiling, broiling, grilling, steaming, or baking.  Use spray oil to cook. Add fat-free chicken broth to add flavor and moisture.  Avoid adding cream to thicken soups or sauces. Use other thickeners such as corn starch or tomato paste. Meal planning   Eat a low-fat diet as told by your dietitian. For most people, this means having no more than 55-65 grams of fat each day.  Eat small, frequent meals throughout the day. For example, you may have 5-6 small meals instead of 3 large meals.  Drink enough fluid to keep your urine pale yellow.  Do not drink alcohol. Talk to your health care provider if you need help stopping.  Limit how much caffeine you have, including black coffee, black and green tea, caffeinated soft drinks, and energy drinks. General information  Let your health care provider or dietitian know if you have unplanned weight loss on this eating plan.  You may be instructed to follow a clear liquid diet during a flare of symptoms. Talk with your health care provider about how to manage your diet during symptoms of a flare.  Take any vitamins or supplements as told by your health care provider.  Work with a Data processing manager, especially if you have other conditions such as obesity or diabetes mellitus. What foods should I avoid? Fruits Fried fruits. Fruits served with butter or cream. Vegetables Fried vegetables. Vegetables cooked with butter, cheese, or cream. Grains Biscuits, waffles, donuts, pastries, and croissants. Pies and cookies. Butter-flavored popcorn. Regular crackers. Meats and other protein foods Fatty cuts of meat. Poultry with skin. Organ meats. Bacon, sausage, and cold cuts. Whole eggs. Nuts and nut butters. Dairy Whole and 2% milk. Whole milk yogurt. Whole milk ice cream. Cream and half-and-half. Cream cheese. Sour cream. Cheese. Beverages Wine, beer, and liquor. The items listed above may not be a complete list of foods and  beverages to avoid. Contact a dietitian for more information. Summary  Pancreatitis can affect how your body digests food, especially foods with fat.  When you have pancreatitis, it is recommended that you follow a low-fat eating plan to help you recover more quickly and manage symptoms. For most people, this means limiting fat to no more than 55-65 grams per day.  Do not drink alcohol. Limit the amount of caffeine you have, and drink enough  fluid to keep your urine pale yellow. This information is not intended to replace advice given to you by your health care provider. Make sure you discuss any questions you have with your health care provider. Document Released: 03/13/2018 Document Revised: 03/28/2019 Document Reviewed: 03/13/2018 Elsevier Patient Education  2020 [employer]   Leukocytosis Leukocytosis means that a person has more white blood cells than normal. White blood cells are made in the bone marrow. Bone marrow is the spongy tissue inside bones. The main job of white blood cells is to fight infection. Having too many white blood cells is a common condition. It can develop as a result of many types of medical problems. What are the causes? Leukocytosis may be caused by various conditions. In some cases, the bone marrow is normal but is still making too many white blood cells. This can be due to:  Infection.  Injury.  Physical stress.  Emotional stress.  Surgery.  Allergic reactions.  Tumors that do not start in the blood or bone marrow.  An inherited disease.  Certain medicines.  Pregnancy and labor. In other cases, a person may have a bone marrow disorder that is causing the body to make too many white blood cells. Bone marrow disorders include:  Leukemia. This is a type of blood cancer.  Myeloproliferative disorders. These disorders cause blood cells to grow abnormally. What are the signs or symptoms? Often, this condition causes no symptoms. Some people may  have symptoms due to the medical condition that is causing their leukocytosis. These symptoms may include:  Bleeding.  Bruising.  Fever.  Night sweats.  Swollen lymph nodes.  An enlarged spleen.  Repeated infections.  Weakness.  Weight loss. How is this diagnosed? This condition is diagnosed with blood tests. It is often found when blood is tested as part of a routine physical exam. You may have other tests to help determine why you have too many white blood cells. These tests may include:  A complete blood count (CBC). This test measures all the types of blood cells in your body.  Chest X-rays, urine tests, or other tests to look for signs of infection.  Bone marrow aspiration. For this test, a needle is put into your bone. Cells from the bone marrow are removed through the needle and examined under a microscope.  Other tests on the blood or bone marrow sample.  CT scan, bone scan, or other imaging tests. How is this treated? Usually, treatment is not needed for leukocytosis. However, if an infection, cancer, bone marrow disorder, or other serious problem is causing your leukocytosis, it will need to be treated. Treatment may include:  Regular monitoring of your white blood cell count to look for changes.  Antibiotic medicine if you have a bacterial infection.  Bone marrow transplant. This treatment replaces your diseased bone marrow with healthy cells that will grow new bone marrow.  Chemotherapy or biological therapies such as the use of antibodies. These treatments may be used to kill cancer cells or to decrease the number of white blood cells. Follow these instructions at home: Medicines  Take over-the-counter and prescription medicines only as told by your health care provider.  If you were prescribed an antibiotic medicine, take it as told by your health care provider. Do not stop taking the antibiotic even if you start to feel better. Eating and  drinking   Eat foods that are low in saturated fats and high in fiber. Eat plenty of fruits and vegetables.  Drink enough fluid to keep your urine pale yellow.  Limit your intake of caffeine and alcohol. General instructions  Maintain a healthy weight. Ask your health care provider what weight is best for you.  Do 30 minutes of exercise at least 5 times each week. Check with your health care provider before you start a new exercise routine.  Follow any safety precautions as told by your health care provider. This may be needed if you are at increased risk for infection or bleeding because of your condition.  Do not use any products that contain nicotine or tobacco, such as cigarettes, e-cigarettes, and chewing tobacco. If you need help quitting, ask your health care provider.  Keep all follow-up visits as told by your health care provider. This is important. Contact a health care provider if you:  Feel weak or more tired than usual.  Develop chills, a cough, or nasal congestion.  Have a fever.  Lose weight without trying.  Have night sweats.  Bruise easily.  Have new or worsening symptoms. Get help right away if you:  Bleed more than normal.  Have chest pain.  Have trouble breathing.  Have uncontrolled nausea or vomiting.  Feel dizzy or light-headed. Summary  Leukocytosis means that a person has more white blood cells than normal.  This condition often causes no symptoms.  This condition may be caused by various conditions.  If an infection, cancer, bone marrow disorder, or other serious problem is causing your leukocytosis, it will need to be treated.  Keep all follow-up visits as told by your health care provider. This is important. This information is not intended to replace advice given to you by your health care provider. Make sure you discuss any questions you have with your health care provider. Document Released: 11/24/2011 Document Revised: 08/30/2018  Document Reviewed: 08/30/2018 Elsevier Patient Education  2020 [employer]

## 2019-07-08 NOTE — Discharge Summary (Signed)
Physician Discharge Summary   Patient ID: Paul CreedBrian E Allen MRN: [id number] DOB/AGE: 39/01/1981 39 y.o.  Admit date: 07/04/2019 Discharge date: 07/08/2019  Primary Care Physician:  Patient, No Pcp Per   Recommendations for Outpatient Follow-up:  1. Follow up with PCP in 1-2 weeks  Home Health: None  Equipment/Devices:   Discharge Condition: stable  CODE STATUS: FULL  Diet recommendation: Low-fat diet   Discharge Diagnoses:   . Acute alcoholic pancreatitis . Hypokalemia . Elevated transaminases . Alcohol abuse   Consults: None    Allergies:   Allergies  Allergen Reactions  . Penicillins Rash    Has patient had a PCN reaction causing immediate rash, facial/tongue/throat swelling, SOB or lightheadedness with hypotension: yes Has patient had a PCN reaction causing severe rash involving mucus membranes or skin necrosis: Yes Has patient had a PCN reaction that required hospitalization: No Has patient had a PCN reaction occurring within the last 10 years: No If all of the above answers are "NO", then may proceed with Cephalosporin use.      DISCHARGE MEDICATIONS: Allergies as of 07/08/2019      Reactions   Penicillins Rash   Has patient had a PCN reaction causing immediate rash, facial/tongue/throat swelling, SOB or lightheadedness with hypotension: yes Has patient had a PCN reaction causing severe rash involving mucus membranes or skin necrosis: Yes Has patient had a PCN reaction that required hospitalization: No Has patient had a PCN reaction occurring within the last 10 years: No If all of the above answers are "NO", then may proceed with Cephalosporin use.      Medication List    TAKE these medications   benzonatate 100 MG capsule Commonly known as: TESSALON Take 1 capsule (100 mg total) by mouth 3 (three) times daily as needed for cough.   multivitamin with minerals Tabs tablet Take 1 tablet by mouth daily.   naproxen sodium 220 MG tablet Commonly known  as: ALEVE Take 220 mg by mouth 2 (two) times daily as needed (pain).   nicotine 21 mg/24hr patch Commonly known as: NICODERM CQ - dosed in mg/24 hours Place 1 patch (21 mg total) onto the skin daily. Start taking on: July 09, 2019   ondansetron 4 MG disintegrating tablet Commonly known as: Zofran ODT Take 1 tablet (4 mg total) by mouth every 8 (eight) hours as needed for nausea or vomiting.   thiamine 100 MG tablet Take 1 tablet (100 mg total) by mouth daily. Start taking on: July 09, 2019   traMADol 50 MG tablet Commonly known as: Ultram Take 1 tablet (50 mg total) by mouth every 8 (eight) hours as needed for moderate pain or severe pain.        Brief H and P: For complete details please refer to admission H and P, but in brief *Patient is a 38 year old male with history of alcoholic pancreatitis, chronic alcohol abuse presented to ED with right upper quadrant abdominal pain on the morning of admission.  Patient reported abdominal pain, right upper quadrant and radiating to epigastric region, with nausea and vomiting.  No hematemesis, melena or hematochezia. Patient reported drinking 7-8 beers daily, last on 7/15.  CT abdomen showed findings consistent with acute pancreatitis, gallbladder normal.  COVID-19 test negative  Hospital Course:   Acute alcoholic pancreatitis -Abdominal pain much improved, tolerating low-fat diet - Lipase improving 819->390->140 -> 61-> 57 -Continue pain control, antiemetics -Triglycerides 252.  CT abdomen did not show any cholelithiasis or cholecystitis. -Counseled strongly on alcohol cessation, low-fat  diet, hydration   Elevated transaminases -Likely due to alcohol abuse, LFTs improving, patient recommended to quit alcohol  Alcohol abuse -Counseled strongly on alcohol cessation, currently not in acute withdrawals. -H patient was placed on CIWA protocol with Ativan, fluids, thiamine and folic acid.  Patient reported drinking 7-8 beers  every day.  Hypokalemia Replaced   Day of Discharge S: Feeling a lot better, tolerating solid diet, abdominal pain improved, no fevers.  BP (!) 163/89   Pulse 60   Temp 98 F (36.7 C) (Oral)   Resp 16   SpO2 96%   Physical Exam: General: Alert and awake oriented x3 not in any acute distress. HEENT: anicteric sclera, pupils reactive to light and accommodation CVS: S1-S2 clear no murmur rubs or gallops Chest: clear to auscultation bilaterally, no wheezing rales or rhonchi Abdomen: soft nontender, nondistended, normal bowel sounds Extremities: no cyanosis, clubbing or edema noted bilaterally Neuro: Cranial nerves II-XII intact, no focal neurological deficits   The results of significant diagnostics from this hospitalization (including imaging, microbiology, ancillary and laboratory) are listed below for reference.      Procedures/Studies:  Ct Abdomen Pelvis W Contrast  Result Date: 07/04/2019 CLINICAL DATA:  Nausea vomiting and right upper quadrant pain. EXAM: CT ABDOMEN AND PELVIS WITH CONTRAST TECHNIQUE: Multidetector CT imaging of the abdomen and pelvis was performed using the standard protocol following bolus administration of intravenous contrast. CONTRAST:  100mL OMNIPAQUE IOHEXOL 300 MG/ML  SOLN COMPARISON:  October 24, 2018 FINDINGS: Lower chest: No acute abnormality. Hepatobiliary: Hepatic steatosis. Normal appearance of the gallbladder. Pancreas: Edematous appearance of the head/uncinate process of the pancreas with peripancreatic fat stranding and small amount of fluid tracking along the second and third portion of the duodenum. Spleen: Normal in size without focal abnormality. Adrenals/Urinary Tract: Adrenal glands are unremarkable. Kidneys are normal, without renal calculi, focal lesion, or hydronephrosis. Bladder is unremarkable. Stomach/Bowel: Stomach is within normal limits. Post appendectomy. No evidence of bowel wall thickening, distention, or inflammatory changes.  Vascular/Lymphatic: Aortic atherosclerosis, mild. No enlarged abdominal or pelvic lymph nodes. Shotty porta hepatis and upper abdominal retroperitoneal lymph nodes. Reproductive: Prostate is unremarkable. Other: No abdominal wall hernia or abnormality. Musculoskeletal: No acute or significant osseous findings. IMPRESSION: 1. Edematous appearance of the head/uncinate process of the pancreas with peripancreatic fat stranding and small amount of fluid tracking along the second and third portion of the duodenum. These findings are most consistent with acute pancreatitis. No evidence of pseudocyst formation. 2. Hepatic steatosis. 3. Shotty porta hepatis and upper abdominal retroperitoneal lymph nodes, likely reactive. Electronically Signed   By: Ted Mcalpineobrinka  Dimitrova M.D.   On: 07/04/2019 13:58   Dg Chest Port 1 View  Result Date: 07/04/2019 CLINICAL DATA:  Leukocytosis EXAM: PORTABLE CHEST 1 VIEW COMPARISON:  09/08/2011 FINDINGS: The heart size and mediastinal contours are within normal limits. Both lungs are clear. The visualized skeletal structures are unremarkable. IMPRESSION: Normal chest Electronically Signed   By: Charlett NoseKevin  Dover M.D.   On: 07/04/2019 15:59       LAB RESULTS: Basic Metabolic Panel: Recent Labs  Lab 07/07/19 0529 07/08/19 0450  NA 131* 132*  K 3.2* 3.0*  CL 95* 96*  CO2 23 26  GLUCOSE 102* 90  BUN 6 6  CREATININE 0.55* 0.48*  CALCIUM 8.0* 8.1*   Liver Function Tests: Recent Labs  Lab 07/04/19 1105 07/05/19 0434  AST 98* 53*  ALT 58* 43  ALKPHOS 63 55  BILITOT 0.9 1.0  PROT 8.3* 7.0  ALBUMIN 4.8  4.0   Recent Labs  Lab 07/07/19 0529 07/08/19 0450  LIPASE 61* 57*   No results for input(s): AMMONIA in the last 168 hours. CBC: Recent Labs  Lab 07/04/19 1105 07/05/19 0434  WBC 15.9* 14.9*  NEUTROABS 13.4*  --   HGB 18.3* 17.5*  HCT 53.9* 51.6  MCV 97.6 99.0  PLT 196 172   Cardiac Enzymes: No results for input(s): CKTOTAL, CKMB, CKMBINDEX, TROPONINI in  the last 168 hours. BNP: Invalid input(s): POCBNP CBG: No results for input(s): GLUCAP in the last 168 hours.    Disposition and Follow-up: Discharge Instructions    Diet - low sodium heart healthy   Complete by: As directed    Discharge instructions   Complete by: As directed    Low fat diet   Increase activity slowly   Complete by: As directed        DISPOSITION: Home   DISCHARGE FOLLOW-UP    Time coordinating discharge:  35 minutes  Signed:   Estill Cotta M.D. [facility] 07/08/2019, 2:42 PM

## 2019-11-20 ENCOUNTER — Encounter (HOSPITAL_COMMUNITY): Payer: Self-pay | Admitting: Emergency Medicine

## 2019-11-20 ENCOUNTER — Emergency Department (HOSPITAL_COMMUNITY): Payer: Self-pay

## 2019-11-20 ENCOUNTER — Emergency Department (HOSPITAL_COMMUNITY)
Admission: EM | Admit: 2019-11-20 | Discharge: 2019-11-20 | Disposition: A | Discharge status: Home / Self Care | Payer: Self-pay | Attending: Emergency Medicine | Admitting: Emergency Medicine

## 2019-11-20 ENCOUNTER — Other Ambulatory Visit: Payer: Self-pay

## 2019-11-20 DIAGNOSIS — K859 Acute pancreatitis without necrosis or infection, unspecified: Secondary | ICD-10-CM | POA: Insufficient documentation

## 2019-11-20 DIAGNOSIS — F17210 Nicotine dependence, cigarettes, uncomplicated: Secondary | ICD-10-CM | POA: Insufficient documentation

## 2019-11-20 DIAGNOSIS — K2970 Gastritis, unspecified, without bleeding: Secondary | ICD-10-CM | POA: Insufficient documentation

## 2019-11-20 DIAGNOSIS — K2980 Duodenitis without bleeding: Secondary | ICD-10-CM | POA: Insufficient documentation

## 2019-11-20 LAB — COMPREHENSIVE METABOLIC PANEL
ALT: 26 U/L (ref 0–44)
AST: 29 U/L (ref 15–41)
Albumin: 4.3 g/dL (ref 3.5–5.0)
Alkaline Phosphatase: 102 U/L (ref 38–126)
Anion gap: 21 — ABNORMAL HIGH (ref 5–15)
BUN: 16 mg/dL (ref 6–20)
CO2: 30 mmol/L (ref 22–32)
Calcium: 9.7 mg/dL (ref 8.9–10.3)
Chloride: 81 mmol/L — ABNORMAL LOW (ref 98–111)
Creatinine, Ser: 0.84 mg/dL (ref 0.61–1.24)
GFR calc Af Amer: >60 mL/min (ref >60)
GFR calc non Af Amer: >60 mL/min (ref >60)
Glucose, Bld: 153 mg/dL — ABNORMAL HIGH (ref 70–99)
Potassium: 2.9 mmol/L — ABNORMAL LOW (ref 3.5–5.1)
Sodium: 132 mmol/L — ABNORMAL LOW (ref 135–145)
Total Bilirubin: 2.2 mg/dL — ABNORMAL HIGH (ref 0.3–1.2)
Total Protein: 8.2 g/dL — ABNORMAL HIGH (ref 6.5–8.1)

## 2019-11-20 LAB — LIPASE, BLOOD: Lipase: 264 U/L — ABNORMAL HIGH (ref 11–51)

## 2019-11-20 LAB — CBC WITH DIFFERENTIAL/PLATELET
Abs Immature Granulocytes: 0.06 10*3/uL (ref 0.00–0.07)
Basophils Absolute: 0.1 10*3/uL (ref 0.0–0.1)
Basophils Relative: 1 %
Eosinophils Absolute: 0.3 10*3/uL (ref 0.0–0.5)
Eosinophils Relative: 3 %
HCT: 59.5 % — ABNORMAL HIGH (ref 39.0–52.0)
Hemoglobin: 20.9 g/dL — ABNORMAL HIGH (ref 13.0–17.0)
Immature Granulocytes: 1 %
Lymphocytes Relative: 17 %
Lymphs Abs: 2.2 10*3/uL (ref 0.7–4.0)
MCH: 33.3 pg (ref 26.0–34.0)
MCHC: 35.1 g/dL (ref 30.0–36.0)
MCV: 94.9 fL (ref 80.0–100.0)
Monocytes Absolute: 0.9 10*3/uL (ref 0.1–1.0)
Monocytes Relative: 7 %
Neutro Abs: 8.9 10*3/uL — ABNORMAL HIGH (ref 1.7–7.7)
Neutrophils Relative %: 71 %
Platelets: 244 10*3/uL (ref 150–400)
RBC: 6.27 MIL/uL — ABNORMAL HIGH (ref 4.22–5.81)
RDW: 14.4 % (ref 11.5–15.5)
WBC: 12.5 10*3/uL — ABNORMAL HIGH (ref 4.0–10.5)
nRBC: 0 % (ref 0.0–0.2)

## 2019-11-20 MED ORDER — FENTANYL CITRATE (PF) 100 MCG/2ML IJ SOLN
50.0000 ug | Freq: Once | INTRAMUSCULAR | Status: AC
  Administered 2019-11-20: 03:00:00 50 ug via INTRAVENOUS
  Filled 2019-11-20: qty 2

## 2019-11-20 MED ORDER — HYDROMORPHONE HCL 1 MG/ML IJ SOLN
1.0000 mg | Freq: Once | INTRAMUSCULAR | Status: AC
  Administered 2019-11-20: 1 mg via INTRAVENOUS
  Filled 2019-11-20: qty 1

## 2019-11-20 MED ORDER — PANTOPRAZOLE SODIUM 20 MG PO TBEC: 20.0000 mg | DELAYED_RELEASE_TABLET | Freq: Every day | ORAL | 0 refills | Status: DC

## 2019-11-20 MED ORDER — LACTATED RINGERS IV BOLUS
1000.0000 mL | Freq: Once | INTRAVENOUS | Status: AC
  Administered 2019-11-20: 1000 mL via INTRAVENOUS

## 2019-11-20 MED ORDER — FAMOTIDINE 20 MG PO TABS: 20.0000 mg | ORAL_TABLET | Freq: Two times a day (BID) | ORAL | 0 refills | Status: DC

## 2019-11-20 MED ORDER — METOCLOPRAMIDE HCL 5 MG/ML IJ SOLN
10.0000 mg | Freq: Once | INTRAMUSCULAR | Status: AC
  Administered 2019-11-20: 03:00:00 10 mg via INTRAVENOUS
  Filled 2019-11-20: qty 2

## 2019-11-20 MED ORDER — IOHEXOL 300 MG/ML  SOLN
100.0000 mL | Freq: Once | INTRAMUSCULAR | Status: AC | PRN
  Administered 2019-11-20: 04:00:00 100 mL via INTRAVENOUS

## 2019-11-20 MED ORDER — ONDANSETRON 4 MG PO TBDP: 4.0000 mg | ORAL_TABLET | Freq: Once | ORAL | Status: DC | PRN

## 2019-11-20 MED ORDER — OXYCODONE-ACETAMINOPHEN 5-325 MG PO TABS: 2.0000 | ORAL_TABLET | ORAL | 0 refills | Status: DC | PRN

## 2019-11-20 MED ORDER — SODIUM CHLORIDE 0.9 % IV SOLN
80.0000 mg | Freq: Once | INTRAVENOUS | Status: AC
  Administered 2019-11-20: 80 mg via INTRAVENOUS
  Filled 2019-11-20: qty 80

## 2019-11-20 MED ORDER — SODIUM CHLORIDE (PF) 0.9 % IJ SOLN
INTRAMUSCULAR | Status: AC
  Administered 2019-11-20: 04:00:00
  Filled 2019-11-20: qty 50

## 2019-11-20 MED ORDER — ONDANSETRON 4 MG PO TBDP: 4.0000 mg | ORAL_TABLET | Freq: Three times a day (TID) | ORAL | 0 refills | Status: DC | PRN

## 2019-11-20 MED ORDER — FAMOTIDINE IN NACL 20-0.9 MG/50ML-% IV SOLN
20.0000 mg | Freq: Once | INTRAVENOUS | Status: AC
  Administered 2019-11-20: 20 mg via INTRAVENOUS
  Filled 2019-11-20: qty 50

## 2019-11-20 NOTE — ED Notes (Signed)
Patient transported to CT 

## 2019-11-20 NOTE — ED Triage Notes (Signed)
Patient complaining of abdominal pain that has gotten worse in the last two days. Patient states he has nausea and vomiting. He has not been able to hold any food down.

## 2019-11-20 NOTE — ED Notes (Signed)
Patient provided saltine crackers and gingerale. Instructed to inform RN if feeling nauseous or any episodes of emesis.

## 2019-11-21 NOTE — ED Provider Notes (Signed)
Emergency Department Provider Note   I have reviewed the triage vital signs and the nursing notes.   HISTORY  Chief Complaint Abdominal Pain   HPI Paul Allen is a 38 y.o. male who presents the emergency department today secondary to abdominal pain, nausea, vomiting and weight loss.  Patient states he had pancreatitis well times in the past and has quit drinking since that time.  He states that over the last few weeks has had multiple episodes of burning in the bottom of his throat followed by vomiting.  Difficulty tolerating any food or drinks.  Today got worse and the pain got worse and he is 09/81 colicky abdominal pain intermittently that worries him.  Some chills and sweats but no measured temperatures.  No sick contacts.  No suspicious food intake.   No other associated or modifying symptoms.    Past Medical History:  Diagnosis Date   Pancreatitis     Patient Active Problem List   Diagnosis Date Noted   Effusion, right knee    Acute pancreatitis 19/14/7829   Acute alcoholic pancreatitis 56/21/3086   Colitis 10/24/2018   Hyponatremia 10/24/2018   Hypokalemia 10/24/2018   Macrocytosis 10/24/2018   Alcohol abuse 10/24/2018    Past Surgical History:  Procedure Laterality Date   APPENDECTOMY      Current Outpatient Rx   Order #: [id number] Class: Print   Order #: [id number] Class: Historical Med   Order #: [id number] Class: Normal   Order #: [id number] Class: Normal   Order #: [id number] Class: Normal   Order #: [id number] Class: Normal    Allergies Penicillins  History reviewed. No pertinent family history.  Social History Social History   Tobacco Use   Smoking status: Current Every Day Smoker    Packs/day: 1.50    Types: Cigarettes   Smokeless tobacco: Never Used  Substance Use Topics   Alcohol use: Yes    Alcohol/week: 5.0 standard drinks    Types: 5 Cans of beer per week   Drug use: Yes    Types: Marijuana    Review of  Systems  All other systems negative except as documented in the HPI. All pertinent positives and negatives as reviewed in the HPI. ____________________________________________   PHYSICAL EXAM:  VITAL SIGNS: ED Triage Vitals  Enc Vitals Group     BP 11/20/19 0210 (!) 123/92     Pulse Rate 11/20/19 0210 92     Resp 11/20/19 0210 18     Temp 11/20/19 0210 98.8 F (37.1 C)     Temp Source 11/20/19 0210 Oral     SpO2 11/20/19 0210 98 %     Weight 11/20/19 0225 170 lb (77.1 kg)     Height 11/20/19 0225 5\' 9"  (1.753 m)    Constitutional: Alert and oriented. Well appearing and in no acute distress. Eyes: Conjunctivae are normal. PERRL. EOMI. Head: Atraumatic. Nose: No congestion/rhinnorhea. Mouth/Throat: Mucous membranes are moist.  Oropharynx non-erythematous. Neck: No stridor.  No meningeal signs.   Cardiovascular: Normal rate, regular rhythm. Good peripheral circulation. Grossly normal heart sounds.   Respiratory: Normal respiratory effort.  No retractions. Lungs CTAB. Gastrointestinal: Soft and diffusely tender. No distention.  Musculoskeletal: No lower extremity tenderness nor edema. No gross deformities of extremities. Neurologic:  Normal speech and language. No gross focal neurologic deficits are appreciated.  Skin:  Skin is warm, dry and intact. No rash noted.   ____________________________________________   LABS (all labs ordered are listed, but only abnormal results are displayed)  Labs Reviewed  CBC WITH DIFFERENTIAL/PLATELET - Abnormal; Notable for the following components:      Result Value   WBC 12.5 (*)    RBC 6.27 (*)    Hemoglobin 20.9 (*)    HCT 59.5 (*)    Neutro Abs 8.9 (*)    All other components within normal limits  COMPREHENSIVE METABOLIC PANEL - Abnormal; Notable for the following components:   Sodium 132 (*)    Potassium 2.9 (*)    Chloride 81 (*)    Glucose, Bld 153 (*)    Total Protein 8.2 (*)    Total Bilirubin 2.2 (*)    Anion gap 21 (*)     All other components within normal limits  LIPASE, BLOOD - Abnormal; Notable for the following components:   Lipase 264 (*)    All other components within normal limits   ____________________________________________  EKG   EKG Interpretation  Date/Time:    Ventricular Rate:    PR Interval:    QRS Duration:   QT Interval:    QTC Calculation:   R Axis:     Text Interpretation:         ____________________________________________  RADIOLOGY  Ct Abdomen Pelvis W Contrast  Result Date: 11/20/2019 CLINICAL DATA:  Abdominal pain, acute, generalized. Progressive abdominal pain over the last 2 days. Elevated white count. EXAM: CT ABDOMEN AND PELVIS WITH CONTRAST TECHNIQUE: Multidetector CT imaging of the abdomen and pelvis was performed using the standard protocol following bolus administration of intravenous contrast. CONTRAST:  OMNIPAQUE IOHEXOL 300 MG/ML  SOLN COMPARISON:  CT of the abdomen and pelvis 07/04/2019 FINDINGS: Lower chest: The lung bases are clear without focal nodule mass, or airspace disease. Heart size is normal. No significant pleural or pericardial effusion is present. Hepatobiliary: Diffuse fatty infiltration of the liver is again noted. A more heterogeneous pattern is present today. Gallbladder is mildly distended. The common bile duct is mildly distended. Pancreas: Inflammatory changes are present at the head of the pancreas. There is mild duct dilation. Cystic lesion within the head of the pancreas is new, measuring up to 16 mm. This likely represents a developing pseudocyst. Body and distal tail are within normal limits. No definite mass lesion is present. Spleen: Normal in size without focal abnormality. Adrenals/Urinary Tract: Adrenal glands are normal bilaterally. Kidneys and ureters are within normal limits. The urinary bladder is unremarkable. Stomach/Bowel: Inflammatory changes and distal stomach and proximal duodenum are noted without obstruction. These  may be secondary. Small bowel is within normal limits. Terminal ileum is unremarkable. Appendectomy is noted. Marked inflammatory changes are present in the hepatic flexure extensive pericolonic stranding is noted. No free air is present. The transverse colon is otherwise within normal limits. Descending and sigmoid colon are normal. Vascular/Lymphatic: Minimal calcifications scratched at scattered calcifications are present in the aorta and branch vessels without aneurysm. No significant retroperitoneal adenopathy is present. Reproductive: Prostate is unremarkable. Other: Free fluid is noted about the pancreatic head. Additional fluid is present within the anatomic pelvis. No free air is present. No significant ventral hernia is present. Musculoskeletal: No acute or significant osseous findings. IMPRESSION: 1. Inflammatory changes at the head of the pancreas compatible with acute pancreatitis. 2. Marked inflammatory changes about the hepatic flexure of the colon. This is likely a secondary colitis due to inflammatory changes at the pancreatic head. No associated obstruction. 3. New 16 mm cystic lesion within the head of the pancreas likely represents a developing pseudocyst. 4. Inflammatory changes in the distal stomach  and proximal duodenum without obstruction. 5. Diffuse fatty infiltration of the liver. 6. Aortic Atherosclerosis (ICD10-I70.0). Electronically Signed   By: Marin Robertshristopher  Mattern M.D.   On: 11/20/2019 04:28    ____________________________________________   PROCEDURES  Procedure(s) performed:   Procedures   ____________________________________________   INITIAL IMPRESSION / ASSESSMENT AND PLAN / ED COURSE  Patient with what appears to be pancreatitis again with a pseudocyst that is there.  He is also having colitis and gastritis which explain his symptoms more appropriately.  He has been loaded with acid reducers, pain medication and nausea medication.  He is tolerating p.o. much  better than he has been the last few weeks and his pain is nearly 0 he states.  I suspect the pancreatitis is probably improving and is more of the other inflammation is causing a problem but as this is the third or fourth time he is at pancreatitis in the last couple years and this time is without drinking and developing pseudocyst I will have him referred to gastroenterology for further management and work-up.  Will return here if anything happens more acutely.   Pertinent labs & imaging results that were available during my care of the patient were reviewed by me and considered in my medical decision making (see chart for details).   A medical screening exam was performed and I feel the patient has had an appropriate workup for their chief complaint at this time and likelihood of emergent condition existing is low. They have been counseled on decision, discharge, follow up and which symptoms necessitate immediate return to the emergency department. They or their family verbally stated understanding and agreement with plan and discharged in stable condition.   ____________________________________________  FINAL CLINICAL IMPRESSION(S) / ED DIAGNOSES  Final diagnoses:  Acute pancreatitis, unspecified complication status, unspecified pancreatitis type  Duodenitis  Gastritis without bleeding, unspecified chronicity, unspecified gastritis type     MEDICATIONS GIVEN DURING THIS VISIT:  Medications  lactated ringers bolus 1,000 mL (0 mLs Intravenous Stopped 11/20/19 0459)  fentaNYL (SUBLIMAZE) injection 50 mcg (50 mcg Intravenous Given 11/20/19 0328)  metoCLOPramide (REGLAN) injection 10 mg (10 mg Intravenous Given 11/20/19 0328)  famotidine (PEPCID) IVPB 20 mg premix (0 mg Intravenous Stopped 11/20/19 0406)  pantoprazole (PROTONIX) 80 mg in sodium chloride 0.9 % 100 mL IVPB (0 mg Intravenous Stopped 11/20/19 0449)  sodium chloride (PF) 0.9 % injection (  Given by Other 11/20/19 0408)  iohexol  (OMNIPAQUE) 300 MG/ML solution 100 mL (100 mLs Intravenous Contrast Given 11/20/19 0408)  HYDROmorphone (DILAUDID) injection 1 mg (1 mg Intravenous Given 11/20/19 0502)     NEW OUTPATIENT MEDICATIONS STARTED DURING THIS VISIT:  Discharge Medication List as of 11/20/2019  7:27 AM    START taking these medications   Details  famotidine (PEPCID) 20 MG tablet Take 1 tablet (20 mg total) by mouth 2 (two) times daily., Starting Wed 11/20/2019, Normal    oxyCODONE-acetaminophen (PERCOCET) 5-325 MG tablet Take 2 tablets by mouth every 4 (four) hours as needed., Starting Wed 11/20/2019, Normal    pantoprazole (PROTONIX) 20 MG tablet Take 1 tablet (20 mg total) by mouth daily., Starting Wed 11/20/2019, Normal        Note:  This note was prepared with assistance of Dragon voice recognition software. Occasional wrong-word or sound-a-like substitutions may have occurred due to the inherent limitations of voice recognition software.   Corban Kistler, Barbara CowerJason, MD 11/21/19 [phone]

## 2019-11-28 ENCOUNTER — Other Ambulatory Visit (INDEPENDENT_AMBULATORY_CARE_PROVIDER_SITE_OTHER): Payer: Self-pay

## 2019-11-28 ENCOUNTER — Encounter: Payer: Self-pay | Admitting: Gastroenterology

## 2019-11-28 ENCOUNTER — Ambulatory Visit (INDEPENDENT_AMBULATORY_CARE_PROVIDER_SITE_OTHER): Payer: Self-pay | Admitting: Gastroenterology

## 2019-11-28 VITALS — BP 112/74 | HR 66 | Temp 97.2°F | Ht 69.0 in | Wt 164.2 lb

## 2019-11-28 DIAGNOSIS — K859 Acute pancreatitis without necrosis or infection, unspecified: Secondary | ICD-10-CM

## 2019-11-28 DIAGNOSIS — R17 Unspecified jaundice: Secondary | ICD-10-CM

## 2019-11-28 LAB — CBC WITH DIFFERENTIAL/PLATELET
Basophils Absolute: 0.1 10*3/uL (ref 0.0–0.1)
Basophils Relative: 0.9 % (ref 0.0–3.0)
Eosinophils Absolute: 0.5 10*3/uL (ref 0.0–0.7)
Eosinophils Relative: 5.1 % — ABNORMAL HIGH (ref 0.0–5.0)
HCT: 57.7 % — ABNORMAL HIGH (ref 39.0–52.0)
Hemoglobin: 19.4 g/dL (ref 13.0–17.0)
Lymphocytes Relative: 20.4 % (ref 12.0–46.0)
Lymphs Abs: 2 10*3/uL (ref 0.7–4.0)
MCHC: 33.6 g/dL (ref 30.0–36.0)
MCV: 96.5 fl (ref 78.0–100.0)
Monocytes Absolute: 0.6 10*3/uL (ref 0.1–1.0)
Monocytes Relative: 6.4 % (ref 3.0–12.0)
Neutro Abs: 6.5 10*3/uL (ref 1.4–7.7)
Neutrophils Relative %: 67.2 % (ref 43.0–77.0)
Platelets: 271 10*3/uL (ref 150.0–400.0)
RBC: 5.98 Mil/uL — ABNORMAL HIGH (ref 4.22–5.81)
RDW: 15.1 % (ref 11.5–15.5)
WBC: 9.6 10*3/uL (ref 4.0–10.5)

## 2019-11-28 LAB — COMPREHENSIVE METABOLIC PANEL
ALT: 27 U/L (ref 0–53)
AST: 29 U/L (ref 0–37)
Albumin: 4.3 g/dL (ref 3.5–5.2)
Alkaline Phosphatase: 117 U/L (ref 39–117)
BUN: 9 mg/dL (ref 6–23)
CO2: 30 mEq/L (ref 19–32)
Calcium: 9.8 mg/dL (ref 8.4–10.5)
Chloride: 96 mEq/L (ref 96–112)
Creatinine, Ser: 0.58 mg/dL (ref 0.40–1.50)
GFR: 155.89 mL/min (ref >60.00)
Glucose, Bld: 101 mg/dL — ABNORMAL HIGH (ref 70–99)
Potassium: 3.4 mEq/L — ABNORMAL LOW (ref 3.5–5.1)
Sodium: 138 mEq/L (ref 135–145)
Total Bilirubin: 1.2 mg/dL (ref 0.2–1.2)
Total Protein: 7.7 g/dL (ref 6.0–8.3)

## 2019-11-28 LAB — LIPASE: Lipase: 232 U/L — ABNORMAL HIGH (ref 11.0–59.0)

## 2019-11-28 MED ORDER — TRAMADOL HCL 50 MG PO TABS: 50.0000 mg | ORAL_TABLET | Freq: Three times a day (TID) | ORAL | 0 refills | Status: DC | PRN

## 2019-11-28 NOTE — Patient Instructions (Signed)
If you are age 39 or older, your body mass index should be between 23-30. Your Body mass index is 24.26 kg/m. If this is out of the aforementioned range listed, please consider follow up with your Primary Care Provider.  If you are age 73 or younger, your body mass index should be between 19-25. Your Body mass index is 24.26 kg/m. If this is out of the aformentioned range listed, please consider follow up with your Primary Care Provider.   You have been scheduled for an abdominal ultrasound at [facility] (1st floor of hospital) on Thursday 12/05/19 at 9 am. Please arrive 15 minutes prior to your appointment for registration. Make certain not to have anything to eat or drink 6 hours prior to your appointment. Should you need to reschedule your appointment, please contact radiology at [phone]. This test typically takes about 30 minutes to perform.  We have sent the following medications to your pharmacy for you to pick up at your convenience: 1. Tramadol every 8 hours as needed.

## 2019-11-28 NOTE — Progress Notes (Signed)
11/28/2019 Paul Allen [id number] 1981/10/22   HISTORY OF PRESENT ILLNESS: This is a 38 year old male who is a self-pay patient.  He has seen here in follow-up from a recent ER visit.  It appears that he has had now 3 episodes of pancreatitis since November of last year.  The most recent CT scan during his ER visit on December 2, just last week.  Triglycerides around 250.  LFTs had previously been normal, but on this occasion total bilirubin was 2.2.  Now on 3 CT scans gallbladder and biliary tree had all been unremarkable until the most recent which suggested mild common bile duct distention.  He does admit to using alcohol usually 3-5 beers on a daily basis.  He does tell me, however, that he has not had any alcohol at all since October.  He has been tolerating large amounts of fluids so has been able to keep well-hydrated.  Has been tolerating food as well.  They did give him oxycodone in the emergency department and he has used it sparingly, taking 1 pill a day over the last 3 days.  He is asking for something for pain.  I am not going to document all of the CT scans here, but they are available in the system to for review.  On the most recent study does show probably a 53mm pancreatic pseudocyst that is developing.  CT scans had also shown some inflammatory changes in the distal stomach, proximal duodenum, hepatic flexure on different occasions that were adjacent to the pancreas and thought to be secondary to the pancreatic inflammation.  He continues to complain of abdominal pain, but says that it is improved.  Potassium was low at 2.9 in the ER.  Also had a elevated white blood cell count at 12.5.  Hemoglobin has been running high as well as compared to 1 year ago when it was on the slightly low side.    Past Medical History:  Diagnosis Date  . Abdominal pain   . Nausea & vomiting   . Pancreatitis    Past Surgical History:  Procedure Laterality Date  . APPENDECTOMY      reports  that he has been smoking cigarettes. He has been smoking about 1.50 packs per day. He has never used smokeless tobacco. He reports previous alcohol use. He reports current drug use. Frequency: 7.00 times per week. Drug: Marijuana. family history includes Arthritis in his mother; Cholecystitis in his mother; Stomach cancer in his father. Allergies  Allergen Reactions  . Penicillins Rash          Outpatient Encounter Medications as of 11/28/2019  Medication Sig  . famotidine (PEPCID) 20 MG tablet Take 1 tablet (20 mg total) by mouth 2 (two) times daily.  . Multiple Vitamin (MULTIVITAMIN WITH MINERALS) TABS tablet Take 1 tablet by mouth daily.  Marland Kitchen oxyCODONE-acetaminophen (PERCOCET) 5-325 MG tablet Take 2 tablets by mouth every 4 (four) hours as needed.  . pantoprazole (PROTONIX) 20 MG tablet Take 1 tablet (20 mg total) by mouth daily.  . [DISCONTINUED] naproxen sodium (ALEVE) 220 MG tablet Take 220 mg by mouth 2 (two) times daily as needed (pain).  . [DISCONTINUED] ondansetron (ZOFRAN ODT) 4 MG disintegrating tablet Take 1 tablet (4 mg total) by mouth every 8 (eight) hours as needed for nausea or vomiting. (Patient not taking: Reported on 11/28/2019)   No facility-administered encounter medications on file as of 11/28/2019.     REVIEW OF SYSTEMS  : All other systems  reviewed and negative except where noted in the History of Present Illness.   PHYSICAL EXAM: BP 112/74   Pulse 66   Temp (!) 97.2 F (36.2 C)   Ht 5\' 9"  (1.753 m)   Wt 164 lb 4 oz (74.5 kg)   BMI 24.26 kg/m  General: Well developed white male in no acute distress; non-toxic appearing Head: Normocephalic and atraumatic Eyes:  Sclerae anicteric, conjunctiva pink. Ears: Normal auditory acuity Lungs: Clear throughout to auscultation; no increased WOB. Heart: Regular rate and rhythm; no M/R/G. Abdomen: Soft, non-distended.  BS present.  Diffuse TTP. Musculoskeletal: Symmetrical with no gross deformities  Skin: No lesions  on visible extremities Extremities: No edema  Neurological: Alert oriented x 4, grossly non-focal  Psychological:  Alert and cooperative. Normal mood and affect  ASSESSMENT AND PLAN: *38 year old male who appears to have had 3 documented episodes of acute pancreatitis in the past year.  Now also with a 42mm developing pseudocyst.  Highly suspect that this is from alcohol use, although he denies all alcohol use since October and just had a CT scan last week that showed pancreatitis.  Gallbladder and biliary tree have all been unremarkable previously, but this most recent CT scan did suggest common bile duct being mildly distended.  LFTs had also been normal previously, but on this occasion he also had a total bilirubin of 2.2.  Triglycerides high, but not high enough to cause pancreatitis.  Advised him that he needs to avoid all alcohol.  He is tolerating large amounts of fluids and food as well.  We will check labs today including CBC, CMP, and lipase.  I think that is worth getting an ultrasound of his gallbladder and biliary tree to be sure that there are no other issues there.  He was taking oxycodone, 1 a day that he was using sparingly from his ER visit.  He asked for something for pain, but actually suggested and was willing to take only tramadol.  Prescription was sent.  CT scans have suggested some inflammatory changes in the distal stomach, proximal duodenum, hepatic flexure.  He has no bowel complaints.  His subsequent CT scans then show no evidence of these findings/resolution so I do think that these findings are due to adjacent pancreatic inflammation. *Hypokalemia: Had a potassium level of 2.9 at his ER visit last week.  We will recheck that today. *Elevated hemoglobin: A year ago hemoglobin was actually on the lower side around 11 to 11.5 grams, but most recently it has been high ranging from 17.5 to 21 g most recently.  Question if this could be from alcohol use and the most recent one may  have been elevated due to some acute dehydration issue.  I did not order iron studies initially, but we are contacting the lab to see if this can be added on.  If not then he may need iron studies checked at his follow-up visit.  **I am going to have a follow-up with Dr. Loletha Carrow in approximately 4 weeks.   CC:  Mesner, Corene Cornea, MD

## 2019-11-29 ENCOUNTER — Other Ambulatory Visit (INDEPENDENT_AMBULATORY_CARE_PROVIDER_SITE_OTHER): Payer: Self-pay

## 2019-11-29 DIAGNOSIS — R17 Unspecified jaundice: Secondary | ICD-10-CM

## 2019-11-29 DIAGNOSIS — K852 Alcohol induced acute pancreatitis without necrosis or infection: Secondary | ICD-10-CM

## 2019-11-29 LAB — IBC + FERRITIN
Ferritin: 316.7 ng/mL (ref 22.0–322.0)
Iron: 143 ug/dL (ref 42–165)
Saturation Ratios: 47.3 % (ref 20.0–50.0)
Transferrin: 216 mg/dL (ref 212.0–360.0)

## 2019-12-02 ENCOUNTER — Inpatient Hospital Stay (HOSPITAL_COMMUNITY)
Admission: EM | Admit: 2019-12-02 | Discharge: 2019-12-15 | DRG: 438 | Disposition: A | Discharge status: Home / Self Care | Payer: Self-pay | Attending: Family Medicine | Admitting: Family Medicine

## 2019-12-02 ENCOUNTER — Other Ambulatory Visit: Payer: Self-pay

## 2019-12-02 ENCOUNTER — Telehealth: Payer: Self-pay | Admitting: Gastroenterology

## 2019-12-02 ENCOUNTER — Encounter (HOSPITAL_COMMUNITY): Payer: Self-pay | Admitting: Emergency Medicine

## 2019-12-02 DIAGNOSIS — Z79891 Long term (current) use of opiate analgesic: Secondary | ICD-10-CM

## 2019-12-02 DIAGNOSIS — Z880 Allergy status to penicillin: Secondary | ICD-10-CM

## 2019-12-02 DIAGNOSIS — Z8719 Personal history of other diseases of the digestive system: Secondary | ICD-10-CM

## 2019-12-02 DIAGNOSIS — Z800 Family history of malignant neoplasm of digestive organs: Secondary | ICD-10-CM

## 2019-12-02 DIAGNOSIS — Z79899 Other long term (current) drug therapy: Secondary | ICD-10-CM

## 2019-12-02 DIAGNOSIS — D582 Other hemoglobinopathies: Secondary | ICD-10-CM

## 2019-12-02 DIAGNOSIS — Z20828 Contact with and (suspected) exposure to other viral communicable diseases: Secondary | ICD-10-CM | POA: Diagnosis present

## 2019-12-02 DIAGNOSIS — K863 Pseudocyst of pancreas: Secondary | ICD-10-CM | POA: Diagnosis present

## 2019-12-02 DIAGNOSIS — K2980 Duodenitis without bleeding: Secondary | ICD-10-CM | POA: Diagnosis present

## 2019-12-02 DIAGNOSIS — R1013 Epigastric pain: Secondary | ICD-10-CM

## 2019-12-02 DIAGNOSIS — K766 Portal hypertension: Secondary | ICD-10-CM | POA: Diagnosis present

## 2019-12-02 DIAGNOSIS — K8521 Alcohol induced acute pancreatitis with uninfected necrosis: Principal | ICD-10-CM | POA: Diagnosis present

## 2019-12-02 DIAGNOSIS — E46 Unspecified protein-calorie malnutrition: Secondary | ICD-10-CM | POA: Diagnosis present

## 2019-12-02 DIAGNOSIS — K55069 Acute infarction of intestine, part and extent unspecified: Secondary | ICD-10-CM | POA: Diagnosis present

## 2019-12-02 DIAGNOSIS — R739 Hyperglycemia, unspecified: Secondary | ICD-10-CM | POA: Diagnosis not present

## 2019-12-02 DIAGNOSIS — E663 Overweight: Secondary | ICD-10-CM | POA: Diagnosis present

## 2019-12-02 DIAGNOSIS — R7989 Other specified abnormal findings of blood chemistry: Secondary | ICD-10-CM

## 2019-12-02 DIAGNOSIS — K2900 Acute gastritis without bleeding: Secondary | ICD-10-CM | POA: Diagnosis present

## 2019-12-02 DIAGNOSIS — K8689 Other specified diseases of pancreas: Secondary | ICD-10-CM

## 2019-12-02 DIAGNOSIS — K839 Disease of biliary tract, unspecified: Secondary | ICD-10-CM

## 2019-12-02 DIAGNOSIS — K8050 Calculus of bile duct without cholangitis or cholecystitis without obstruction: Secondary | ICD-10-CM

## 2019-12-02 DIAGNOSIS — Z8261 Family history of arthritis: Secondary | ICD-10-CM

## 2019-12-02 DIAGNOSIS — K859 Acute pancreatitis without necrosis or infection, unspecified: Secondary | ICD-10-CM | POA: Diagnosis present

## 2019-12-02 DIAGNOSIS — R188 Other ascites: Secondary | ICD-10-CM

## 2019-12-02 DIAGNOSIS — K3189 Other diseases of stomach and duodenum: Secondary | ICD-10-CM | POA: Diagnosis present

## 2019-12-02 DIAGNOSIS — R9431 Abnormal electrocardiogram [ECG] [EKG]: Secondary | ICD-10-CM | POA: Diagnosis not present

## 2019-12-02 DIAGNOSIS — K7011 Alcoholic hepatitis with ascites: Secondary | ICD-10-CM | POA: Diagnosis present

## 2019-12-02 DIAGNOSIS — E876 Hypokalemia: Secondary | ICD-10-CM | POA: Diagnosis present

## 2019-12-02 DIAGNOSIS — N5089 Other specified disorders of the male genital organs: Secondary | ICD-10-CM | POA: Diagnosis not present

## 2019-12-02 DIAGNOSIS — Z6827 Body mass index (BMI) 27.0-27.9, adult: Secondary | ICD-10-CM

## 2019-12-02 DIAGNOSIS — E860 Dehydration: Secondary | ICD-10-CM | POA: Diagnosis present

## 2019-12-02 DIAGNOSIS — K8070 Calculus of gallbladder and bile duct without cholecystitis without obstruction: Secondary | ICD-10-CM | POA: Diagnosis present

## 2019-12-02 DIAGNOSIS — E871 Hypo-osmolality and hyponatremia: Secondary | ICD-10-CM | POA: Diagnosis present

## 2019-12-02 DIAGNOSIS — F17210 Nicotine dependence, cigarettes, uncomplicated: Secondary | ICD-10-CM | POA: Diagnosis present

## 2019-12-02 DIAGNOSIS — F1010 Alcohol abuse, uncomplicated: Secondary | ICD-10-CM | POA: Diagnosis present

## 2019-12-02 DIAGNOSIS — I81 Portal vein thrombosis: Secondary | ICD-10-CM

## 2019-12-02 HISTORY — DX: Acute infarction of intestine, part and extent unspecified: K55.069

## 2019-12-02 LAB — URINALYSIS, ROUTINE W REFLEX MICROSCOPIC
Glucose, UA: 50 mg/dL — AB
Hgb urine dipstick: NEGATIVE
Ketones, ur: 5 mg/dL — AB
Leukocytes,Ua: NEGATIVE
Nitrite: NEGATIVE
Protein, ur: 30 mg/dL — AB
Specific Gravity, Urine: 1.031 — ABNORMAL HIGH (ref 1.005–1.030)
pH: 5 (ref 5.0–8.0)

## 2019-12-02 LAB — COMPREHENSIVE METABOLIC PANEL
ALT: 66 U/L — ABNORMAL HIGH (ref 0–44)
AST: 84 U/L — ABNORMAL HIGH (ref 15–41)
Albumin: 4.2 g/dL (ref 3.5–5.0)
Alkaline Phosphatase: 107 U/L (ref 38–126)
Anion gap: 16 — ABNORMAL HIGH (ref 5–15)
BUN: 13 mg/dL (ref 6–20)
CO2: 29 mmol/L (ref 22–32)
Calcium: 9.4 mg/dL (ref 8.9–10.3)
Chloride: 88 mmol/L — ABNORMAL LOW (ref 98–111)
Creatinine, Ser: 0.56 mg/dL — ABNORMAL LOW (ref 0.61–1.24)
GFR calc Af Amer: >60 mL/min (ref >60)
GFR calc non Af Amer: >60 mL/min (ref >60)
Glucose, Bld: 112 mg/dL — ABNORMAL HIGH (ref 70–99)
Potassium: 3.5 mmol/L (ref 3.5–5.1)
Sodium: 133 mmol/L — ABNORMAL LOW (ref 135–145)
Total Bilirubin: 1.6 mg/dL — ABNORMAL HIGH (ref 0.3–1.2)
Total Protein: 7.7 g/dL (ref 6.5–8.1)

## 2019-12-02 LAB — LIPASE, BLOOD: Lipase: 435 U/L — ABNORMAL HIGH (ref 11–51)

## 2019-12-02 LAB — CBC
HCT: 57.9 % — ABNORMAL HIGH (ref 39.0–52.0)
Hemoglobin: 20.3 g/dL — ABNORMAL HIGH (ref 13.0–17.0)
MCH: 33.7 pg (ref 26.0–34.0)
MCHC: 35.1 g/dL (ref 30.0–36.0)
MCV: 96.2 fL (ref 80.0–100.0)
Platelets: 272 10*3/uL (ref 150–400)
RBC: 6.02 MIL/uL — ABNORMAL HIGH (ref 4.22–5.81)
RDW: 13.9 % (ref 11.5–15.5)
WBC: 15.5 10*3/uL — ABNORMAL HIGH (ref 4.0–10.5)
nRBC: 0 % (ref 0.0–0.2)

## 2019-12-02 MED ORDER — ONDANSETRON 4 MG PO TBDP
4.0000 mg | ORAL_TABLET | Freq: Once | ORAL | Status: AC | PRN
  Administered 2019-12-02: 20:00:00 4 mg via ORAL
  Filled 2019-12-02: qty 1

## 2019-12-02 MED ORDER — SODIUM CHLORIDE 0.9% FLUSH
3.0000 mL | Freq: Once | INTRAVENOUS | Status: AC
  Administered 2019-12-03: 3 mL via INTRAVENOUS

## 2019-12-02 NOTE — Telephone Encounter (Signed)
Pt called to inform that his situation has worsened and he was going to the ED at [facility] hospital at this moment.

## 2019-12-02 NOTE — Progress Notes (Signed)
____________________________________________________________  Attending physician addendum:  Thank you for sending this case to me. I have reviewed the entire note, and the outlined plan seems appropriate.  However, I recommend not providing any additional prescriptions for pain medicine in this patient with a history of alcohol dependency.  Wilfrid Lund, MD  ____________________________________________________________

## 2019-12-02 NOTE — ED Triage Notes (Addendum)
Patient states that he is having abdominal pain that started two weeks ago. Patient states it has not gotten any better, that it is worse. He states he is not able to hold down food or water. Patient states he was diagnosed with Pancreatitis.

## 2019-12-02 NOTE — Telephone Encounter (Signed)
FYI the pt is going to the ED for eval for pancreatitis

## 2019-12-03 ENCOUNTER — Emergency Department (HOSPITAL_COMMUNITY): Payer: Self-pay

## 2019-12-03 DIAGNOSIS — R7989 Other specified abnormal findings of blood chemistry: Secondary | ICD-10-CM

## 2019-12-03 DIAGNOSIS — K859 Acute pancreatitis without necrosis or infection, unspecified: Secondary | ICD-10-CM | POA: Diagnosis present

## 2019-12-03 DIAGNOSIS — D582 Other hemoglobinopathies: Secondary | ICD-10-CM

## 2019-12-03 LAB — COMPREHENSIVE METABOLIC PANEL
ALT: 272 U/L — ABNORMAL HIGH (ref 0–44)
AST: 484 U/L — ABNORMAL HIGH (ref 15–41)
Albumin: 3.5 g/dL (ref 3.5–5.0)
Alkaline Phosphatase: 91 U/L (ref 38–126)
Anion gap: 13 (ref 5–15)
BUN: 13 mg/dL (ref 6–20)
CO2: 26 mmol/L (ref 22–32)
Calcium: 8.5 mg/dL — ABNORMAL LOW (ref 8.9–10.3)
Chloride: 94 mmol/L — ABNORMAL LOW (ref 98–111)
Creatinine, Ser: 0.52 mg/dL — ABNORMAL LOW (ref 0.61–1.24)
GFR calc Af Amer: >60 mL/min (ref >60)
GFR calc non Af Amer: >60 mL/min (ref >60)
Glucose, Bld: 116 mg/dL — ABNORMAL HIGH (ref 70–99)
Potassium: 3.7 mmol/L (ref 3.5–5.1)
Sodium: 133 mmol/L — ABNORMAL LOW (ref 135–145)
Total Bilirubin: 2.1 mg/dL — ABNORMAL HIGH (ref 0.3–1.2)
Total Protein: 6.4 g/dL — ABNORMAL LOW (ref 6.5–8.1)

## 2019-12-03 LAB — HIV ANTIBODY (ROUTINE TESTING W REFLEX): HIV Screen 4th Generation wRfx: NONREACTIVE

## 2019-12-03 LAB — LIPASE, BLOOD: Lipase: 354 U/L — ABNORMAL HIGH (ref 11–51)

## 2019-12-03 LAB — SARS CORONAVIRUS 2 (TAT 6-24 HRS): SARS Coronavirus 2: NEGATIVE

## 2019-12-03 MED ORDER — MORPHINE SULFATE (PF) 2 MG/ML IV SOLN
2.0000 mg | INTRAVENOUS | Status: DC | PRN
  Administered 2019-12-03 (×2): 2 mg via INTRAVENOUS
  Filled 2019-12-03 (×2): qty 1

## 2019-12-03 MED ORDER — SODIUM CHLORIDE 0.9 % IV BOLUS
1000.0000 mL | Freq: Once | INTRAVENOUS | Status: AC
  Administered 2019-12-03: 1000 mL via INTRAVENOUS

## 2019-12-03 MED ORDER — ONDANSETRON HCL 4 MG/2ML IJ SOLN
4.0000 mg | Freq: Once | INTRAMUSCULAR | Status: AC
  Administered 2019-12-03: 4 mg via INTRAVENOUS
  Filled 2019-12-03: qty 2

## 2019-12-03 MED ORDER — PANTOPRAZOLE SODIUM 40 MG IV SOLR
40.0000 mg | Freq: Two times a day (BID) | INTRAVENOUS | Status: DC
  Administered 2019-12-03 – 2019-12-14 (×24): 40 mg via INTRAVENOUS
  Filled 2019-12-03 (×25): qty 40

## 2019-12-03 MED ORDER — ENOXAPARIN SODIUM 40 MG/0.4ML ~~LOC~~ SOLN: 40.0000 mg | SUBCUTANEOUS | Status: DC

## 2019-12-03 MED ORDER — ACETAMINOPHEN 650 MG RE SUPP: 650.0000 mg | Freq: Four times a day (QID) | RECTAL | Status: DC | PRN

## 2019-12-03 MED ORDER — ONDANSETRON HCL 4 MG/2ML IJ SOLN
4.0000 mg | Freq: Four times a day (QID) | INTRAMUSCULAR | Status: DC | PRN
  Administered 2019-12-03 – 2019-12-05 (×6): 4 mg via INTRAVENOUS
  Filled 2019-12-03 (×7): qty 2

## 2019-12-03 MED ORDER — THIAMINE HCL 100 MG PO TABS
100.0000 mg | ORAL_TABLET | Freq: Every day | ORAL | Status: DC
  Administered 2019-12-05 – 2019-12-15 (×11): 100 mg via ORAL
  Filled 2019-12-03 (×12): qty 1

## 2019-12-03 MED ORDER — ONDANSETRON HCL 4 MG PO TABS
4.0000 mg | ORAL_TABLET | Freq: Four times a day (QID) | ORAL | Status: DC | PRN
  Administered 2019-12-09 – 2019-12-14 (×4): 4 mg via ORAL
  Filled 2019-12-03 (×4): qty 1

## 2019-12-03 MED ORDER — FOLIC ACID 1 MG PO TABS
1.0000 mg | ORAL_TABLET | Freq: Every day | ORAL | Status: DC
  Administered 2019-12-05 – 2019-12-15 (×11): 1 mg via ORAL
  Filled 2019-12-03 (×12): qty 1

## 2019-12-03 MED ORDER — ACETAMINOPHEN 325 MG PO TABS: 650.0000 mg | ORAL_TABLET | Freq: Four times a day (QID) | ORAL | Status: DC | PRN

## 2019-12-03 MED ORDER — SODIUM CHLORIDE 0.9 % IV SOLN: INTRAVENOUS | Status: AC

## 2019-12-03 MED ORDER — SODIUM CHLORIDE 0.9 % IV SOLN: INTRAVENOUS | Status: DC

## 2019-12-03 MED ORDER — OXYCODONE HCL 5 MG PO TABS
5.0000 mg | ORAL_TABLET | ORAL | Status: DC | PRN
  Administered 2019-12-03 – 2019-12-05 (×6): 5 mg via ORAL
  Filled 2019-12-03 (×6): qty 1

## 2019-12-03 MED ORDER — ADULT MULTIVITAMIN W/MINERALS CH
1.0000 | ORAL_TABLET | Freq: Every day | ORAL | Status: DC
  Administered 2019-12-05 – 2019-12-15 (×9): 1 via ORAL
  Filled 2019-12-03 (×12): qty 1

## 2019-12-03 MED ORDER — MORPHINE SULFATE (PF) 4 MG/ML IV SOLN
2.0000 mg | INTRAVENOUS | Status: DC | PRN
  Administered 2019-12-03 – 2019-12-04 (×5): 2 mg via INTRAVENOUS
  Filled 2019-12-03 (×6): qty 1

## 2019-12-03 MED ORDER — MORPHINE SULFATE (PF) 4 MG/ML IV SOLN
6.0000 mg | Freq: Once | INTRAVENOUS | Status: AC
  Administered 2019-12-03: 6 mg via INTRAVENOUS
  Filled 2019-12-03: qty 2

## 2019-12-03 NOTE — H&P (Signed)
History and Physical    Paul Allen XFG:[id number] DOB: 06-Nov-1981 DOA: 12/02/2019  PCP: Patient, No Pcp Per  Patient coming from: Home I have personally reviewed patient's old records in [facility] link  Chief Complaint: Abdominal pain  HPI: Paul Allen is a 38 y.o. male with medical history significant of history of alcohol abuse and pancreatitis.  In the past few weeks, he has experienced intermittent epigastric pain and was diagnosed with acute pancreatitis following which he was treated as an outpatient.  However, his pain  progressively worsened to 9/10, sharp, continuous, radiating to the lower abdomen and there is no specific relieving or aggravating factor.  There is associated nausea, vomiting and anorexia.  He denies fever, cough, chest pain, shortness of breath, orthopnea, PND, polyuria, polydipsia, frequency or dysuria.  Based on his progressively worsening symptoms, he came to the ED for further evaluation .  ED Course: At the ED, he was hemodynamically stable and afebrile.  He had significantly elevated lipase in the 400s and his CT abdomen/pelvis was consistent with acute pancreatitis complicated by pseudocyst.  Review of Systems: As per HPI otherwise 10 point review of systems negative.   Past Medical History:  Diagnosis Date   Abdominal pain    Nausea & vomiting    Pancreatitis     Past Surgical History:  Procedure Laterality Date   APPENDECTOMY       reports that he has been smoking cigarettes. He has been smoking about 1.50 packs per day. He has never used smokeless tobacco. He reports previous alcohol use. He reports current drug use. Frequency: 7.00 times per week. Drug: Marijuana.  Allergies  Allergen Reactions   Penicillins Rash    Has patient had a PCN reaction causing immediate rash, facial/tongue/throat swelling, SOB or lightheadedness with hypotension: yes Has patient had a PCN reaction causing severe rash involving mucus membranes or skin  necrosis: Yes Has patient had a PCN reaction that required hospitalization: No Has patient had a PCN reaction occurring within the last 10 years: No If all of the above answers are "NO", then may proceed with Cephalosporin use.     Family History  Problem Relation Age of Onset   Cholecystitis Mother    Arthritis Mother    Stomach cancer Father    Family history : negative for type 2 diabetes or hypertension  Prior to Admission medications   Medication Sig Start Date End Date Taking? Authorizing Provider  famotidine (PEPCID) 20 MG tablet Take 1 tablet (20 mg total) by mouth 2 (two) times daily. 11/20/19  Yes Mesner, Barbara Cower, MD  Multiple Vitamin (MULTIVITAMIN WITH MINERALS) TABS tablet Take 1 tablet by mouth daily. 10/28/18  Yes Regalado, Belkys A, MD  oxyCODONE-acetaminophen (PERCOCET) 5-325 MG tablet Take 2 tablets by mouth every 4 (four) hours as needed. Patient taking differently: Take 2 tablets by mouth every 4 (four) hours as needed for moderate pain or severe pain.  11/20/19  Yes Mesner, Barbara Cower, MD  pantoprazole (PROTONIX) 20 MG tablet Take 1 tablet (20 mg total) by mouth daily. 11/20/19  Yes Mesner, Barbara Cower, MD  traMADol (ULTRAM) 50 MG tablet Take 1 tablet (50 mg total) by mouth every 8 (eight) hours as needed. Patient taking differently: Take 50 mg by mouth every 8 (eight) hours as needed for moderate pain or severe pain.  11/28/19  Yes Zehr, Princella Pellegrini, PA-C    Physical Exam: Vitals:   12/02/19 1933 12/03/19 0012  BP: (!) 152/99 (!) 145/102  Pulse: 87  81  Resp: 17 15  Temp: 98.3 F (36.8 C) 98.8 F (37.1 C)  TempSrc: Oral Oral  SpO2: 98% 98%  Weight: 74.4 kg   Height: 5\' 9"  (1.753 m)     Constitutional: NAD, calm, comfortable Vitals:   12/02/19 1933 12/03/19 0012  BP: (!) 152/99 (!) 145/102  Pulse: 87 81  Resp: 17 15  Temp: 98.3 F (36.8 C) 98.8 F (37.1 C)  TempSrc: Oral Oral  SpO2: 98% 98%  Weight: 74.4 kg   Height: 5\' 9"  (1.753 m)    Eyes: PERRL, lids and  conjunctivae normal ENMT: Mucous membranes are moist. Posterior pharynx clear of any exudate or lesions.Normal dentition.  Neck: normal, supple, no masses, no thyromegaly Respiratory: clear to auscultation bilaterally, no wheezing, no crackles. Normal respiratory effort. No accessory muscle use.  Cardiovascular: Regular rate and rhythm, no murmurs / rubs / gallops. No extremity edema. 2+ pedal pulses. No carotid bruits.  Abdomen: Epigastric tenderness, no masses palpated. No hepatosplenomegaly. Bowel sounds positive.  Musculoskeletal: no clubbing / cyanosis. No joint deformity upper and lower extremities. Good ROM, no contractures. Normal muscle tone.  Skin: no rashes, lesions, ulcers. No induration Neurologic: CN 2-12 grossly intact. Sensation intact, DTR normal. Strength 5/5 in all 4.  Psychiatric: Normal judgment and insight. Alert and oriented x 3. Normal mood.   Labs on Admission: I have personally reviewed following labs and imaging studies  CBC: Recent Labs  Lab 11/28/19 1545 12/02/19 2010  WBC 9.6 15.5*  NEUTROABS 6.5  --   HGB 19.4 Repeated and verified X2.* 20.3*  HCT 57.7 Repeated and verified X2.* 57.9*  MCV 96.5 96.2  PLT 271.0 272   Basic Metabolic Panel: Recent Labs  Lab 11/28/19 1545 12/02/19 2010  NA 138 133*  K 3.4* 3.5  CL 96 88*  CO2 30 29  GLUCOSE 101* 112*  BUN 9 13  CREATININE 0.58 0.56*  CALCIUM 9.8 9.4   GFR: Estimated Creatinine Clearance: 124 mL/min (A) (by C-G formula based on SCr of 0.56 mg/dL (L)). Liver Function Tests: Recent Labs  Lab 11/28/19 1545 12/02/19 2010  AST 29 84*  ALT 27 66*  ALKPHOS 117 107  BILITOT 1.2 1.6*  PROT 7.7 7.7  ALBUMIN 4.3 4.2   Recent Labs  Lab 11/28/19 1545 12/02/19 2010  LIPASE 232.0* 435*   No results for input(s): AMMONIA in the last 168 hours. Coagulation Profile: No results for input(s): INR, PROTIME in the last 168 hours. Cardiac Enzymes: No results for input(s): CKTOTAL, CKMB, CKMBINDEX,  TROPONINI in the last 168 hours. BNP (last 3 results) No results for input(s): PROBNP in the last 8760 hours. HbA1C: No results for input(s): HGBA1C in the last 72 hours. CBG: No results for input(s): GLUCAP in the last 168 hours. Lipid Profile: No results for input(s): CHOL, HDL, LDLCALC, TRIG, CHOLHDL, LDLDIRECT in the last 72 hours. Thyroid Function Tests: No results for input(s): TSH, T4TOTAL, FREET4, T3FREE, THYROIDAB in the last 72 hours. Anemia Panel: No results for input(s): VITAMINB12, FOLATE, FERRITIN, TIBC, IRON, RETICCTPCT in the last 72 hours. Urine analysis:    Component Value Date/Time   COLORURINE AMBER (A) 12/02/2019 2010   APPEARANCEUR HAZY (A) 12/02/2019 2010   LABSPEC 1.031 (H) 12/02/2019 2010   PHURINE 5.0 12/02/2019 2010   GLUCOSEU 50 (A) 12/02/2019 2010   HGBUR NEGATIVE 12/02/2019 2010   BILIRUBINUR SMALL (A) 12/02/2019 2010   KETONESUR 5 (A) 12/02/2019 2010   PROTEINUR 30 (A) 12/02/2019 2010   UROBILINOGEN 1.0  01/13/2008 2059   NITRITE NEGATIVE 12/02/2019 2010   LEUKOCYTESUR NEGATIVE 12/02/2019 2010    Radiological Exams on Admission: No results found.  EKG: Independently reviewed.  Sinus rhythm with no acute ischemic changes from EKG in July 2010  Assessment/Plan Active Problems:   Pancreatitis, acute    1.  Acute pancreatitis: Patient has history of alcohol abuse but he denies alcohol consumption in the past month.  We will ensure adequate pain control with morphine.  IV fluids and supportive care.  Follow-up on abdominal ultrasound.  Monitor patient closely  2.  Hyponatremia: We will give IV normal saline and monitor sodium levels closely.  Secondary to GI sodium losses.  3.  Dehydration: Patient appears clinically dehydrated.  We will give IV fluids and monitor volume status of patient closely.  4.  Acute gastritis/duodenitis: Patient will be covered empirically with Protonix.  Based on his clinical course, we will consider GI consultation.    5. Chronic alcohol abuse: Patient denies alcohol use in the past month.  We will however institute CIWA protocol and cover patient with thiamine, folic acid, multivitamin and Ativan.   DVT prophylaxis: SCD  Code Status: Full code Family Communication:  Disposition Plan: Patient to be discharged home in the next 3 to 4 days Consults called: We may consider GI consult Admission status:   Phineas Semen MD [facility] Pager [phone]  If 7PM-7AM, please contact night-coverage [email or web address] Password [email or web address]  12/03/2019, 1:20 AM

## 2019-12-03 NOTE — ED Notes (Signed)
Report given to Katie RN. Care transferred at this time

## 2019-12-03 NOTE — ED Provider Notes (Signed)
[facility] DEPT Provider Note   CSN: [id number] Arrival date & time: 12/02/19  5400     History Chief Complaint  Patient presents with  . Abdominal Pain    Paul Allen is a 38 y.o. male.  38 year old male presents with several days of increasing epigastric pain is now radiates to his left upper and left lower quadrant.  Patient seen and diagnosed with pancreatitis 2 weeks ago.  Denies any current use of alcohol.  No fever or chills.  Patient has been seen by GI and is scheduled to have an outpatient abdominal ultrasound.  States that he cannot keep anything down.  Denies any black or bloody stools.  Has been using tramadol as well as Percocet with no relief.        Past Medical History:  Diagnosis Date  . Abdominal pain   . Nausea & vomiting   . Pancreatitis     Patient Active Problem List   Diagnosis Date Noted  . Elevated bilirubin 11/28/2019  . Effusion, right knee   . Acute pancreatitis 10/25/2018  . Acute alcoholic pancreatitis 86/76/1950  . Colitis 10/24/2018  . Hyponatremia 10/24/2018  . Hypokalemia 10/24/2018  . Macrocytosis 10/24/2018  . Alcohol abuse 10/24/2018    Past Surgical History:  Procedure Laterality Date  . APPENDECTOMY         Family History  Problem Relation Age of Onset  . Cholecystitis Mother   . Arthritis Mother   . Stomach cancer Father     Social History   Tobacco Use  . Smoking status: Current Every Day Smoker    Packs/day: 1.50    Types: Cigarettes  . Smokeless tobacco: Never Used  Substance Use Topics  . Alcohol use: Not Currently  . Drug use: Yes    Frequency: 7.0 times per week    Types: Marijuana    Home Medications Prior to Admission medications   Medication Sig Start Date End Date Taking? Authorizing Provider  famotidine (PEPCID) 20 MG tablet Take 1 tablet (20 mg total) by mouth 2 (two) times daily. 11/20/19   Mesner, Corene Cornea, MD  Multiple Vitamin (MULTIVITAMIN WITH MINERALS)  TABS tablet Take 1 tablet by mouth daily. 10/28/18   Regalado, Belkys A, MD  oxyCODONE-acetaminophen (PERCOCET) 5-325 MG tablet Take 2 tablets by mouth every 4 (four) hours as needed. 11/20/19   Mesner, Corene Cornea, MD  pantoprazole (PROTONIX) 20 MG tablet Take 1 tablet (20 mg total) by mouth daily. 11/20/19   Mesner, Corene Cornea, MD  traMADol (ULTRAM) 50 MG tablet Take 1 tablet (50 mg total) by mouth every 8 (eight) hours as needed. 11/28/19   Zehr, Laban Emperor, PA-C    Allergies    Penicillins  Review of Systems   Review of Systems  All other systems reviewed and are negative.   Physical Exam Updated Vital Signs BP (!) 145/102 (BP Location: Left Arm)   Pulse 81   Temp 98.8 F (37.1 C) (Oral)   Resp 15   Ht 1.753 m (5\' 9" )   Wt 74.4 kg   SpO2 98%   BMI 24.22 kg/m   Physical Exam Vitals and nursing note reviewed.  Constitutional:      General: He is not in acute distress.    Appearance: Normal appearance. He is well-developed. He is not toxic-appearing.  HENT:     Head: Normocephalic and atraumatic.  Eyes:     General: Lids are normal.     Conjunctiva/sclera: Conjunctivae normal.  Pupils: Pupils are equal, round, and reactive to light.  Neck:     Thyroid: No thyroid mass.     Trachea: No tracheal deviation.  Cardiovascular:     Rate and Rhythm: Normal rate and regular rhythm.     Heart sounds: Normal heart sounds. No murmur. No gallop.   Pulmonary:     Effort: Pulmonary effort is normal. No respiratory distress.     Breath sounds: Normal breath sounds. No stridor. No decreased breath sounds, wheezing, rhonchi or rales.  Abdominal:     General: Bowel sounds are normal. There is no distension.     Palpations: Abdomen is soft.     Tenderness: There is abdominal tenderness in the epigastric area. There is guarding. There is no rebound.    Musculoskeletal:        General: No tenderness. Normal range of motion.     Cervical back: Normal range of motion and neck supple.  Skin:     General: Skin is warm and dry.     Findings: No abrasion or rash.  Neurological:     Mental Status: He is alert and oriented to person, place, and time.     GCS: GCS eye subscore is 4. GCS verbal subscore is 5. GCS motor subscore is 6.     Cranial Nerves: No cranial nerve deficit.     Sensory: No sensory deficit.  Psychiatric:        Speech: Speech normal.        Behavior: Behavior normal.     ED Results / Procedures / Treatments   Labs (all labs ordered are listed, but only abnormal results are displayed) Labs Reviewed  LIPASE, BLOOD - Abnormal; Notable for the following components:      Result Value   Lipase 435 (*)    All other components within normal limits  COMPREHENSIVE METABOLIC PANEL - Abnormal; Notable for the following components:   Sodium 133 (*)    Chloride 88 (*)    Glucose, Bld 112 (*)    Creatinine, Ser 0.56 (*)    AST 84 (*)    ALT 66 (*)    Total Bilirubin 1.6 (*)    Anion gap 16 (*)    All other components within normal limits  CBC - Abnormal; Notable for the following components:   WBC 15.5 (*)    RBC 6.02 (*)    Hemoglobin 20.3 (*)    HCT 57.9 (*)    All other components within normal limits  URINALYSIS, ROUTINE W REFLEX MICROSCOPIC - Abnormal; Notable for the following components:   Color, Urine AMBER (*)    APPearance HAZY (*)    Specific Gravity, Urine 1.031 (*)    Glucose, UA 50 (*)    Bilirubin Urine SMALL (*)    Ketones, ur 5 (*)    Protein, ur 30 (*)    Bacteria, UA RARE (*)    All other components within normal limits  SARS CORONAVIRUS 2 (TAT 6-24 HRS)    EKG None  Radiology No results found.  Procedures Procedures (including critical care time)  Medications Ordered in ED Medications  sodium chloride flush (NS) 0.9 % injection 3 mL (has no administration in time range)  sodium chloride 0.9 % bolus 1,000 mL (has no administration in time range)  0.9 %  sodium chloride infusion (has no administration in time range)  morphine 4  MG/ML injection 6 mg (has no administration in time range)  ondansetron (ZOFRAN) injection 4 mg (  has no administration in time range)  ondansetron (ZOFRAN-ODT) disintegrating tablet 4 mg (4 mg Oral Given 12/02/19 1948)    ED Course  I have reviewed the triage vital signs and the nursing notes.  Pertinent labs & imaging results that were available during my care of the patient were reviewed by me and considered in my medical decision making (see chart for details).    MDM Rules/Calculators/A&P                      Patient evidence of pancreatitis here his lipase is 435 which is almost doubled from his last value 2 weeks ago.  Mild transaminase elevation as well 2.  Mild leukocytosis noted as well 2.  Or order abdominal ultrasound.  Will admit to the medicine service Final Clinical Impression(s) / ED Diagnoses Final diagnoses:  None    Rx / DC Orders ED Discharge Orders    None       Lorre Nick, MD 12/03/19 0028

## 2019-12-03 NOTE — ED Notes (Signed)
ED TO INPATIENT HANDOFF REPORT  ED Nurse Name and Phone #: Hilaria OtaKatie  S Name/Age/Gender Lorinda CreedBrian E Hengel 38 y.o. male Room/Bed: WA28/WA28  Code Status   Code Status: Full Code  Home/SNF/Other Home Patient oriented to: self, place, time and situation Is this baseline? Yes   Triage Complete: Triage complete  Chief Complaint Pancreatitis, acute [K85.90]  Triage Note Patient states that he is having abdominal pain that started two weeks ago. Patient states it has not gotten any better, that it is worse. He states he is not able to hold down food or water. Patient states he was diagnosed with Pancreatitis.    Allergies Allergies  Allergen Reactions  . Penicillins Rash    Has patient had a PCN reaction causing immediate rash, facial/tongue/throat swelling, SOB or lightheadedness with hypotension: yes Has patient had a PCN reaction causing severe rash involving mucus membranes or skin necrosis: Yes Has patient had a PCN reaction that required hospitalization: No Has patient had a PCN reaction occurring within the last 10 years: No If all of the above answers are "NO", then may proceed with Cephalosporin use.     Level of Care/Admitting Diagnosis ED Disposition    ED Disposition Condition Comment   Admit  [facility] [[id number]]  Level of Care: Med-Surg [16]  Covid Evaluation: Asymptomatic Screening Protocol (No Symptoms)  Diagnosis: Pancreatitis, acute [[id number]][[id number]]  Admitting Physician: Leonette NuttingHINBUAH, EGYA N [[id number]]  Attending Physician: Leonette NuttingHINBUAH, EGYA N [[id number]]  Estimated length of stay: 3 - 4 days  Certification:: I certify this patient will need inpatient services for at least 2 midnights       B Medical/Surgery History Past Medical History:  Diagnosis Date  . Abdominal pain   . Nausea & vomiting   . Pancreatitis    Past Surgical History:  Procedure Laterality Date  . APPENDECTOMY       A IV Location/Drains/Wounds Patient  Lines/Drains/Airways Status   Active Line/Drains/Airways    Name:   Placement date:   Placement time:   Site:   Days:   Peripheral IV 12/03/19 Right Forearm   12/03/19    0047    Forearm   less than 1          Intake/Output Last 24 hours  Intake/Output Summary (Last 24 hours) at 12/03/2019 1810 Last data filed at 12/03/2019 0454 Gross per 24 hour  Intake 1290.22 ml  Output -  Net 1290.22 ml    Labs/Imaging Results for orders placed or performed during the hospital encounter of 12/02/19 (from the past 48 hour(s))  Lipase, blood     Status: Abnormal   Collection Time: 12/02/19  8:10 PM  Result Value Ref Range   Lipase 435 (H) 11 - 51 U/L    Comment: RESULTS CONFIRMED BY MANUAL DILUTION Performed at [facility], [address] [place], [place] [address]   Comprehensive metabolic panel     Status: Abnormal   Collection Time: 12/02/19  8:10 PM  Result Value Ref Range   Sodium 133 (L) 135 - 145 mmol/L   Potassium 3.5 3.5 - 5.1 mmol/L   Chloride 88 (L) 98 - 111 mmol/L   CO2 29 22 - 32 mmol/L   Glucose, Bld 112 (H) 70 - 99 mg/dL   BUN 13 6 - 20 mg/dL   Creatinine, Ser 6.290.56 (L) 0.61 - 1.24 mg/dL   Calcium 9.4 8.9 - 52.810.3 mg/dL   Total Protein 7.7 6.5 - 8.1 g/dL   Albumin  4.2 3.5 - 5.0 g/dL   AST 84 (H) 15 - 41 U/L   ALT 66 (H) 0 - 44 U/L   Alkaline Phosphatase 107 38 - 126 U/L   Total Bilirubin 1.6 (H) 0.3 - 1.2 mg/dL   GFR calc non Af Amer >60 >60 mL/min   GFR calc Af Amer >60 >60 mL/min   Anion gap 16 (H) 5 - 15    Comment: Performed at [facility], [address] [place], [place] [address]  CBC     Status: Abnormal   Collection Time: 12/02/19  8:10 PM  Result Value Ref Range   WBC 15.5 (H) 4.0 - 10.5 K/uL   RBC 6.02 (H) 4.22 - 5.81 MIL/uL   Hemoglobin 20.3 (H) 13.0 - 17.0 g/dL   HCT 57.8 (H) 46.9 - 62.9 %   MCV 96.2 80.0 - 100.0 fL   MCH 33.7 26.0 - 34.0 pg   MCHC 35.1 30.0 - 36.0 g/dL   RDW 52.8 41.3 - 24.4 %   Platelets  272 150 - 400 K/uL   nRBC 0.0 0.0 - 0.2 %    Comment: Performed at [facility], [address] [place], [place] [address]  Urinalysis, Routine w reflex microscopic     Status: Abnormal   Collection Time: 12/02/19  8:10 PM  Result Value Ref Range   Color, Urine AMBER (A) YELLOW    Comment: BIOCHEMICALS MAY BE AFFECTED BY COLOR   APPearance HAZY (A) CLEAR   Specific Gravity, Urine 1.031 (H) 1.005 - 1.030   pH 5.0 5.0 - 8.0   Glucose, UA 50 (A) NEGATIVE mg/dL   Hgb urine dipstick NEGATIVE NEGATIVE   Bilirubin Urine SMALL (A) NEGATIVE   Ketones, ur 5 (A) NEGATIVE mg/dL   Protein, ur 30 (A) NEGATIVE mg/dL   Nitrite NEGATIVE NEGATIVE   Leukocytes,Ua NEGATIVE NEGATIVE   RBC / HPF 0-5 0 - 5 RBC/hpf   WBC, UA 0-5 0 - 5 WBC/hpf   Bacteria, UA RARE (A) NONE SEEN   Squamous Epithelial / LPF 0-5 0 - 5   Mucus PRESENT    Hyaline Casts, UA PRESENT     Comment: Performed at [facility], [address], [place], [place] [address]  SARS CORONAVIRUS 2 (TAT 6-24 HRS) Nasopharyngeal Nasopharyngeal Swab     Status: None   Collection Time: 12/03/19 12:42 AM   Specimen: Nasopharyngeal Swab  Result Value Ref Range   SARS Coronavirus 2 NEGATIVE NEGATIVE    Comment: (NOTE) SARS-CoV-2 target nucleic acids are NOT DETECTED. The SARS-CoV-2 RNA is generally detectable in upper and lower respiratory specimens during the acute phase of infection. Negative results do not preclude SARS-CoV-2 infection, do not rule out co-infections with other pathogens, and should not be used as the sole basis for treatment or other patient management decisions. Negative results must be combined with clinical observations, patient history, and epidemiological information. The expected result is Negative. Fact Sheet for Patients: HairSlick.no Fact Sheet for Healthcare Providers: quierodirigir.com This test is not yet approved or  cleared by the [place] FDA and  has been authorized for detection and/or diagnosis of SARS-CoV-2 by FDA under an Emergency Use Authorization (EUA). This EUA will remain  in effect (meaning this test can be used) for the duration of the COVID-19 declaration under Section 56 4(b)(1) of the Act, 21 U.S.C. section 360bbb-3(b)(1), unless the authorization is terminated or revoked sooner. Performed at [facility], [address] [place], [place] [address]   HIV Antibody (routine testing w rflx)     Status: None   Collection Time: 12/03/19  5:03 AM  Result Value Ref Range   HIV Screen 4th Generation wRfx NON REACTIVE NON REACTIVE    Comment: Performed at [facility], [address] [place], [place] [address]  Comprehensive metabolic panel     Status: Abnormal   Collection Time: 12/03/19  5:03 AM  Result Value Ref Range   Sodium 133 (L) 135 - 145 mmol/L   Potassium 3.7 3.5 - 5.1 mmol/L   Chloride 94 (L) 98 - 111 mmol/L   CO2 26 22 - 32 mmol/L   Glucose, Bld 116 (H) 70 - 99 mg/dL   BUN 13 6 - 20 mg/dL   Creatinine, Ser 0.52 (L) 0.61 - 1.24 mg/dL   Calcium 8.5 (L) 8.9 - 10.3 mg/dL   Total Protein 6.4 (L) 6.5 - 8.1 g/dL   Albumin 3.5 3.5 - 5.0 g/dL   AST 484 (H) 15 - 41 U/L   ALT 272 (H) 0 - 44 U/L   Alkaline Phosphatase 91 38 - 126 U/L   Total Bilirubin 2.1 (H) 0.3 - 1.2 mg/dL   GFR calc non Af Amer >60 >60 mL/min   GFR calc Af Amer >60 >60 mL/min   Anion gap 13 5 - 15    Comment: Performed at [facility], [place] [address] [place], [place] [address]  Lipase, blood     Status: Abnormal   Collection Time: 12/03/19  5:03 AM  Result Value Ref Range   Lipase 354 (H) 11 - 51 U/L    Comment: Performed at [facility], [place] [address], [place],  [address]   RUQ [place]  Result Date: 12/03/2019 CLINICAL DATA:  Right upper quadrant abdominal pain. EXAM: ULTRASOUND ABDOMEN LIMITED RIGHT UPPER QUADRANT COMPARISON:  CT dated November 20, 2019 FINDINGS: Gallbladder: There is diffuse gallbladder sludge without evidence for gallbladder wall thickening or pericholecystic free fluid. The sonographic Percell Miller sign is negative. The gallbladder is somewhat distended. Common bile duct: Diameter: 4 mm Liver: The liver parenchyma is coarsened and heterogeneous. There is hepatopetal flow within the main pulmonary vein. There is incomplete color filling of the main pulmonary vein raising concern for portal vein thrombosis. Other: There is a small volume of abdominal ascites. IMPRESSION: 1. Gallbladder sludge without evidence for acute cholecystitis. 2. Incomplete color filling of the portal vein raises concern for portal venous thrombosis. This can be further evaluated with a contrast enhanced CT. 3. Coarsened heterogeneous appearance of the liver likely related to hepatocellular disease. 4. Small volume abdominal ascites. Electronically Signed   By: Constance Holster M.D.   On: 12/03/2019 01:20    Pending Labs Unresulted Labs (From admission, onward)    Start     Ordered   12/03/19 0531  Protime-INR  Once,   R     12/03/19 0531          Vitals/Pain Today's Vitals   12/03/19 1400 12/03/19 1617 12/03/19 1807 12/03/19 1809  BP: 131/80  135/88   Pulse: 66  74   Resp: 16  18   Temp:   98.5 F (36.9 C)   TempSrc:   Oral   SpO2: 97%  100%   Weight:      Height:      PainSc:  8   7     Isolation Precautions No active isolations  Medications Medications  0.9 %  sodium chloride infusion ( Intravenous Stopped 12/03/19 0314)  0.9 %  sodium chloride infusion ( Intravenous Stopped 12/03/19 1047)  acetaminophen (TYLENOL) tablet 650 mg (has no administration in time range)    Or  acetaminophen (TYLENOL) suppository 650 mg (has no administration in time range)  ondansetron (ZOFRAN) tablet 4 mg ( Oral See Alternative 12/03/19 1617)    Or  ondansetron (ZOFRAN) injection 4 mg (4 mg Intravenous Given 12/03/19 1617)  oxyCODONE (Oxy  IR/ROXICODONE) immediate release tablet 5 mg (5 mg Oral Given 12/03/19 0211)  thiamine tablet 100 mg (100 mg Oral Not Given 12/03/19 1047)  multivitamin with minerals tablet 1 tablet (1 tablet Oral Not Given 12/03/19 1047)  folic acid (FOLVITE) tablet 1 mg (1 mg Oral Not Given 12/03/19 1047)  pantoprazole (PROTONIX) injection 40 mg (40 mg Intravenous Given 12/03/19 1039)  morphine 4 MG/ML injection 2 mg ( Intravenous Given 12/03/19 1617)  sodium chloride flush (NS) 0.9 % injection 3 mL (3 mLs Intravenous Given 12/03/19 0049)  ondansetron (ZOFRAN-ODT) disintegrating tablet 4 mg (4 mg Oral Given 12/02/19 1948)  sodium chloride 0.9 % bolus 1,000 mL (0 mLs Intravenous Stopped 12/03/19 0201)  morphine 4 MG/ML injection 6 mg (6 mg Intravenous Given 12/03/19 0049)  ondansetron (ZOFRAN) injection 4 mg (4 mg Intravenous Given 12/03/19 0049)    Mobility walks Low fall risk   Focused Assessments NA   R Recommendations: See Admitting Provider Note  Report given to:   Additional Notes: NA

## 2019-12-03 NOTE — ED Notes (Signed)
Pt ambulatory from [facility] to room 23.

## 2019-12-03 NOTE — Progress Notes (Addendum)
PROGRESS NOTE    DELORES THELEN  QIH:[id number] DOB: 08-27-1981 DOA: 12/02/2019 PCP: Patient, No Pcp Per    Brief Narrative:  38 year old Caucasian male with history of alcohol abuse admitted with acute pancreatitis.   Assessment & Plan:   Active Problems:   Pancreatitis, acute  Acute pancreatitis Presumed to be secondary to alcohol.  Continues to have persistent pain and elevated lipase while of alcohol per patient since November 1.  Had a CT abdomen pelvis recently for similar complaints for which she had a CT abdomen pelvis that showed acute pancreatitis and inflammation of the hepatic flexure.  Right upper quadrant ultrasound this admission showed gallbladder sludge with no evidence of acute cholecystitis but incomplete color filling of portal vein concerning for portal vein thrombosis. We will consult GI in a.m. for need for repeat CT with contrast given he was recently seen in their clinic and has had a recent CT abdomen pelvis with contrast on 11/20/2019 for similar complaints. Continue IV fluids Pain management as needed N.p.o. for now  Elevated hemoglobin Unclear etiology though it could be in setting of volume depletion and him being a current cigarette smoker Hydrate and repeat hemoglobin tomorrow morning We will get erythropoietin, ferritin tomorrow morning  Elevated LFTs Unclear etiology. AST almost double ALT. ?  Alcoholic hepatitis but patient reports not having any alcoholic beverage since November 1 Repeat in a.m.   DVT prophylaxis: Lovenox SQ Code Status: Full code  Family Communication:  Disposition Plan: Pending medical stability   Consultants:   None  Procedures:  None  Antimicrobials:   None   Subjective: No acute events overnight.  Patient seen and examined today.  Complains of continued abdominal pain and recent worsening which brought him to the ED prior to his appointment for right upper quadrant ultrasound.  Smokes about a pack or  less of cigarettes per day.  Denies drinking alcohol since November 1 of this year.  He does not take any prescription medications except for those prescribed for his previous episodes of acute pancreatitis.  Denies any family history of blood clots.  Denies any personal history of blood clots.  Reports family history of pancreatic cancer in grandmother and stomach cancer in father at age 69.  Objective: Vitals:   12/03/19 1200 12/03/19 1400 12/03/19 1807 12/03/19 1832  BP: (!) 130/91 131/80 135/88 (!) 146/90  Pulse: 82 66 74 81  Resp: 16 16 18 18   Temp:   98.5 F (36.9 C) 98.5 F (36.9 C)  TempSrc:   Oral Oral  SpO2: 96% 97% 100% 99%  Weight:      Height:        Intake/Output Summary (Last 24 hours) at 12/03/2019 1911 Last data filed at 12/03/2019 0454 Gross per 24 hour  Intake 1290.22 ml  Output --  Net 1290.22 ml   Filed Weights   12/02/19 1933  Weight: 74.4 kg    Examination:  General exam: Appears calm and comfortable  Respiratory system: Clear to auscultation. Respiratory effort normal. Cardiovascular system: S1 & S2 heard, RRR. No JVD, murmurs, rubs, gallops or clicks. No pedal edema. Gastrointestinal system: Abdomen is nondistended, soft and random areas of focal tenderness. Normal bowel sounds heard. Central nervous system: Alert and oriented. No focal neurological deficits. Extremities: Symmetric 5 x 5 power. Skin: No rashes, lesions or ulcers Psychiatry: Judgement and insight appear normal. Mood & affect appropriate.   Data Reviewed: I have personally reviewed following labs and imaging studies  CBC: Recent Labs  Lab 11/28/19 1545 12/02/19 2010  WBC 9.6 15.5*  NEUTROABS 6.5  --   HGB 19.4 Repeated and verified X2.* 20.3*  HCT 57.7 Repeated and verified X2.* 57.9*  MCV 96.5 96.2  PLT 271.0 272   Basic Metabolic Panel: Recent Labs  Lab 11/28/19 1545 12/02/19 2010 12/03/19 0503  NA 138 133* 133*  K 3.4* 3.5 3.7  CL 96 88* 94*  CO2 30 29 26    GLUCOSE 101* 112* 116*  BUN 9 13 13   CREATININE 0.58 0.56* 0.52*  CALCIUM 9.8 9.4 8.5*   GFR: Estimated Creatinine Clearance: 124 mL/min (A) (by C-G formula based on SCr of 0.52 mg/dL (L)). Liver Function Tests: Recent Labs  Lab 11/28/19 1545 12/02/19 2010 12/03/19 0503  AST 29 84* 484*  ALT 27 66* 272*  ALKPHOS 117 107 91  BILITOT 1.2 1.6* 2.1*  PROT 7.7 7.7 6.4*  ALBUMIN 4.3 4.2 3.5   Recent Labs  Lab 11/28/19 1545 12/02/19 2010 12/03/19 0503  LIPASE 232.0* 435* 354*   No results for input(s): AMMONIA in the last 168 hours. Coagulation Profile: No results for input(s): INR, PROTIME in the last 168 hours. Cardiac Enzymes: No results for input(s): CKTOTAL, CKMB, CKMBINDEX, TROPONINI in the last 168 hours. BNP (last 3 results) No results for input(s): PROBNP in the last 8760 hours. HbA1C: No results for input(s): HGBA1C in the last 72 hours. CBG: No results for input(s): GLUCAP in the last 168 hours. Lipid Profile: No results for input(s): CHOL, HDL, LDLCALC, TRIG, CHOLHDL, LDLDIRECT in the last 72 hours. Thyroid Function Tests: No results for input(s): TSH, T4TOTAL, FREET4, T3FREE, THYROIDAB in the last 72 hours. Anemia Panel: No results for input(s): VITAMINB12, FOLATE, FERRITIN, TIBC, IRON, RETICCTPCT in the last 72 hours. Sepsis Labs: No results for input(s): PROCALCITON, LATICACIDVEN in the last 168 hours.  Recent Results (from the past 240 hour(s))  SARS CORONAVIRUS 2 (TAT 6-24 HRS) Nasopharyngeal Nasopharyngeal Swab     Status: None   Collection Time: 12/03/19 12:42 AM   Specimen: Nasopharyngeal Swab  Result Value Ref Range Status   SARS Coronavirus 2 NEGATIVE NEGATIVE Final    Comment: (NOTE) SARS-CoV-2 target nucleic acids are NOT DETECTED. The SARS-CoV-2 RNA is generally detectable in upper and lower respiratory specimens during the acute phase of infection. Negative results do not preclude SARS-CoV-2 infection, do not rule out co-infections with  other pathogens, and should not be used as the sole basis for treatment or other patient management decisions. Negative results must be combined with clinical observations, patient history, and epidemiological information. The expected result is Negative. Fact Sheet for Patients: HairSlick.nohttps://www.fda.gov/media/138098/download Fact Sheet for Healthcare Providers: quierodirigir.comhttps://www.fda.gov/media/138095/download This test is not yet approved or cleared by the [place] FDA and  has been authorized for detection and/or diagnosis of SARS-CoV-2 by FDA under an Emergency Use Authorization (EUA). This EUA will remain  in effect (meaning this test can be used) for the duration of the COVID-19 declaration under Section 56 4(b)(1) of the Act, 21 U.S.C. section 360bbb-3(b)(1), unless the authorization is terminated or revoked sooner. Performed at [facility], [address] [place], [place] [address]          Radiology Studies: RUQ US  Result Date: 12/03/2019 CLINICAL DATA:  Right upper quadrant abdominal pain. EXAM: ULTRASOUND ABDOMEN LIMITED RIGHT UPPER QUADRANT COMPARISON:  CT dated November 20, 2019 FINDINGS: Gallbladder: There is diffuse gallbladder sludge without evidence for gallbladder wall thickening or pericholecystic free fluid. The sonographic Eulah PontMurphy sign is  negative. The gallbladder is somewhat distended. Common bile duct: Diameter: 4 mm Liver: The liver parenchyma is coarsened and heterogeneous. There is hepatopetal flow within the main pulmonary vein. There is incomplete color filling of the main pulmonary vein raising concern for portal vein thrombosis. Other: There is a small volume of abdominal ascites. IMPRESSION: 1. Gallbladder sludge without evidence for acute cholecystitis. 2. Incomplete color filling of the portal vein raises concern for portal venous thrombosis. This can be further evaluated with a contrast enhanced CT. 3. Coarsened heterogeneous appearance of the liver  likely related to hepatocellular disease. 4. Small volume abdominal ascites. Electronically Signed   By: Katherine Mantle M.D.   On: 12/03/2019 01:20        Scheduled Meds: . folic acid  1 mg Oral Daily  . multivitamin with minerals  1 tablet Oral Daily  . pantoprazole (PROTONIX) IV  40 mg Intravenous Q12H  . thiamine  100 mg Oral Daily   Continuous Infusions: . sodium chloride Stopped (12/03/19 0314)  . sodium chloride Stopped (12/03/19 1047)     LOS: 0 days    Time spent: Spent more than 30 minutes in coordinating care for this patient including bedside patient care.   Liborio Nixon, MD [facility] If 7PM-7AM, please contact night-coverage 12/03/2019, 7:11 PM

## 2019-12-04 ENCOUNTER — Inpatient Hospital Stay (HOSPITAL_COMMUNITY): Payer: Self-pay

## 2019-12-04 DIAGNOSIS — R7989 Other specified abnormal findings of blood chemistry: Secondary | ICD-10-CM

## 2019-12-04 DIAGNOSIS — D582 Other hemoglobinopathies: Secondary | ICD-10-CM

## 2019-12-04 LAB — FERRITIN: Ferritin: 507 ng/mL — ABNORMAL HIGH (ref 24–336)

## 2019-12-04 LAB — COMPREHENSIVE METABOLIC PANEL
ALT: 384 U/L — ABNORMAL HIGH (ref 0–44)
AST: 265 U/L — ABNORMAL HIGH (ref 15–41)
Albumin: 3.1 g/dL — ABNORMAL LOW (ref 3.5–5.0)
Alkaline Phosphatase: 110 U/L (ref 38–126)
Anion gap: 13 (ref 5–15)
BUN: 10 mg/dL (ref 6–20)
CO2: 26 mmol/L (ref 22–32)
Calcium: 8.6 mg/dL — ABNORMAL LOW (ref 8.9–10.3)
Chloride: 94 mmol/L — ABNORMAL LOW (ref 98–111)
Creatinine, Ser: 0.47 mg/dL — ABNORMAL LOW (ref 0.61–1.24)
GFR calc Af Amer: >60 mL/min (ref >60)
GFR calc non Af Amer: >60 mL/min (ref >60)
Glucose, Bld: 121 mg/dL — ABNORMAL HIGH (ref 70–99)
Potassium: 4.4 mmol/L (ref 3.5–5.1)
Sodium: 133 mmol/L — ABNORMAL LOW (ref 135–145)
Total Bilirubin: 2 mg/dL — ABNORMAL HIGH (ref 0.3–1.2)
Total Protein: 6 g/dL — ABNORMAL LOW (ref 6.5–8.1)

## 2019-12-04 LAB — CBC
HCT: 53.4 % — ABNORMAL HIGH (ref 39.0–52.0)
Hemoglobin: 17.6 g/dL — ABNORMAL HIGH (ref 13.0–17.0)
MCH: 32.5 pg (ref 26.0–34.0)
MCHC: 33 g/dL (ref 30.0–36.0)
MCV: 98.7 fL (ref 80.0–100.0)
Platelets: 221 10*3/uL (ref 150–400)
RBC: 5.41 MIL/uL (ref 4.22–5.81)
RDW: 14 % (ref 11.5–15.5)
WBC: 14.7 10*3/uL — ABNORMAL HIGH (ref 4.0–10.5)
nRBC: 0 % (ref 0.0–0.2)

## 2019-12-04 MED ORDER — GADOBUTROL 1 MMOL/ML IV SOLN
7.0000 mL | Freq: Once | INTRAVENOUS | Status: AC | PRN
  Administered 2019-12-04: 7 mL via INTRAVENOUS

## 2019-12-04 MED ORDER — MORPHINE SULFATE (PF) 4 MG/ML IV SOLN
4.0000 mg | INTRAVENOUS | Status: DC | PRN
  Administered 2019-12-04 – 2019-12-05 (×4): 4 mg via INTRAVENOUS
  Filled 2019-12-04 (×4): qty 1

## 2019-12-04 MED ORDER — LACTATED RINGERS IV SOLN
INTRAVENOUS | Status: DC
  Administered 2019-12-08: 1000 mL via INTRAVENOUS

## 2019-12-04 NOTE — Progress Notes (Signed)
PROGRESS NOTE    Paul Allen  WUJ:[id number] DOB: 01/27/1981 DOA: 12/02/2019 PCP: Patient, No Pcp Per    Brief Narrative:  38 year old Caucasian male with history of alcohol abuse admitted with acute pancreatitis.   Assessment & Plan:   Active Problems:   Pancreatitis, acute   Elevated LFTs   Elevated hemoglobin (HCC)  Acute pancreatitis Presumed to be secondary to alcohol.  Continues to have persistent pain and elevated lipase while of alcohol per patient since November 1.  Had a CT abdomen pelvis recently for similar complaints for which she had a CT abdomen pelvis that showed acute pancreatitis and inflammation of the hepatic flexure.  Right upper quadrant ultrasound this admission showed gallbladder sludge with no evidence of acute cholecystitis but incomplete color filling of portal vein concerning for portal vein thrombosis. GI on board. Plan for MRI abdomen to assess for gall stones and questionable portal vein thrombosis  Continue IV fluids Pain management as needed. Increased dose of PRN IV morphine. N.p.o. for now  Concern for portal vein thrombosis ?complication of acute pancreatitis  ?finding on RUQ ultrasound  MRI abdomen pending   Elevated hemoglobin Unclear etiology though it could be in setting of volume depletion and him being a current cigarette smoker Hb remains elevated despite hydration. erythropoietin pending. Ferritin in 500's Consider hematology consult pending MRI abdomen results   Elevated LFTs Unclear etiology. AST almost double ALT. ?  Alcoholic hepatitis but patient reports not having any alcoholic beverage since November 1   DVT prophylaxis: Lovenox SQ Code Status: Full code  Family Communication:  Disposition Plan: Pending medical stability   Consultants:   GI  Procedures:  None  Antimicrobials:   None   Subjective: No acute events overnight.  Patient seen and examined today.  Complains of abdominal pain that eases off  with morphine but comes back. Asking for a medication that starts with 'D'. Discussed will increase dose of morphine instead. Denies nausea, diarrhea.   Objective: Vitals:   12/03/19 1832 12/03/19 2112 12/04/19 0441 12/04/19 1702  BP: (!) 146/90 (!) 143/89 (!) 133/97 131/83  Pulse: 81 72 80 76  Resp: 18 20 20 12   Temp: 98.5 F (36.9 C) 98.7 F (37.1 C) 98.7 F (37.1 C) 98 F (36.7 C)  TempSrc: Oral Oral Oral Oral  SpO2: 99% 97% 96% 98%  Weight:      Height:        Intake/Output Summary (Last 24 hours) at 12/04/2019 1809 Last data filed at 12/04/2019 1615 Gross per 24 hour  Intake --  Output 625 ml  Net -625 ml   Filed Weights   12/02/19 1933  Weight: 74.4 kg    Examination:  General exam: Appears calm and comfortable  Respiratory system: Clear to auscultation. Respiratory effort normal. Cardiovascular system: S1 & S2 heard, RRR. No JVD, murmurs, rubs, gallops or clicks. No pedal edema. Gastrointestinal system: Abdomen is nondistended, soft and random areas of focal tenderness. Normal bowel sounds heard. Central nervous system: Alert and oriented. No focal neurological deficits. Extremities: Symmetric 5 x 5 power. Skin: No rashes, lesions or ulcers Psychiatry: Judgement and insight appear normal. Mood & affect appropriate.   Data Reviewed: I have personally reviewed following labs and imaging studies  CBC: Recent Labs  Lab 11/28/19 1545 12/02/19 2010 12/04/19 0528  WBC 9.6 15.5* 14.7*  NEUTROABS 6.5  --   --   HGB 19.4 Repeated and verified X2.* 20.3* 17.6*  HCT 57.7 Repeated and verified X2.* 57.9* 53.4*  MCV 96.5 96.2 98.7  PLT 271.0 272 221   Basic Metabolic Panel: Recent Labs  Lab 11/28/19 1545 12/02/19 2010 12/03/19 0503 12/04/19 0528  NA 138 133* 133* 133*  K 3.4* 3.5 3.7 4.4  CL 96 88* 94* 94*  CO2 30 29 26 26   GLUCOSE 101* 112* 116* 121*  BUN 9 13 13 10   CREATININE 0.58 0.56* 0.52* 0.47*  CALCIUM 9.8 9.4 8.5* 8.6*   GFR: Estimated  Creatinine Clearance: 124 mL/min (A) (by C-G formula based on SCr of 0.47 mg/dL (L)). Liver Function Tests: Recent Labs  Lab 11/28/19 1545 12/02/19 2010 12/03/19 0503 12/04/19 0528  AST 29 84* 484* 265*  ALT 27 66* 272* 384*  ALKPHOS 117 107 91 110  BILITOT 1.2 1.6* 2.1* 2.0*  PROT 7.7 7.7 6.4* 6.0*  ALBUMIN 4.3 4.2 3.5 3.1*   Recent Labs  Lab 11/28/19 1545 12/02/19 2010 12/03/19 0503  LIPASE 232.0* 435* 354*   No results for input(s): AMMONIA in the last 168 hours. Coagulation Profile: No results for input(s): INR, PROTIME in the last 168 hours. Cardiac Enzymes: No results for input(s): CKTOTAL, CKMB, CKMBINDEX, TROPONINI in the last 168 hours. BNP (last 3 results) No results for input(s): PROBNP in the last 8760 hours. HbA1C: No results for input(s): HGBA1C in the last 72 hours. CBG: No results for input(s): GLUCAP in the last 168 hours. Lipid Profile: No results for input(s): CHOL, HDL, LDLCALC, TRIG, CHOLHDL, LDLDIRECT in the last 72 hours. Thyroid Function Tests: No results for input(s): TSH, T4TOTAL, FREET4, T3FREE, THYROIDAB in the last 72 hours. Anemia Panel: Recent Labs    12/04/19 0528  FERRITIN 507*   Sepsis Labs: No results for input(s): PROCALCITON, LATICACIDVEN in the last 168 hours.  Recent Results (from the past 240 hour(s))  SARS CORONAVIRUS 2 (TAT 6-24 HRS) Nasopharyngeal Nasopharyngeal Swab     Status: None   Collection Time: 12/03/19 12:42 AM   Specimen: Nasopharyngeal Swab  Result Value Ref Range Status   SARS Coronavirus 2 NEGATIVE NEGATIVE Final    Comment: (NOTE) SARS-CoV-2 target nucleic acids are NOT DETECTED. The SARS-CoV-2 RNA is generally detectable in upper and lower respiratory specimens during the acute phase of infection. Negative results do not preclude SARS-CoV-2 infection, do not rule out co-infections with other pathogens, and should not be used as the sole basis for treatment or other patient management  decisions. Negative results must be combined with clinical observations, patient history, and epidemiological information. The expected result is Negative. Fact Sheet for Patients: 12/06/19 Fact Sheet for Healthcare Providers: 12/05/19 This test is not yet approved or cleared by the [employer] FDA and  has been authorized for detection and/or diagnosis of SARS-CoV-2 by FDA under an Emergency Use Authorization (EUA). This EUA will remain  in effect (meaning this test can be used) for the duration of the COVID-19 declaration under Section 56 4(b)(1) of the Act, 21 U.S.C. section 360bbb-3(b)(1), unless the authorization is terminated or revoked sooner. Performed at [facility], [address] [place], [address] [place]          Radiology Studies: RUQ [place]  Result Date: 12/03/2019 CLINICAL DATA:  Right upper quadrant abdominal pain. EXAM: ULTRASOUND ABDOMEN LIMITED RIGHT UPPER QUADRANT COMPARISON:  CT dated November 20, 2019 FINDINGS: Gallbladder: There is diffuse gallbladder sludge without evidence for gallbladder wall thickening or pericholecystic free fluid. The sonographic 12/05/2019 sign is negative. The gallbladder is somewhat distended. Common bile duct: Diameter: 4 mm Liver: The liver  parenchyma is coarsened and heterogeneous. There is hepatopetal flow within the main pulmonary vein. There is incomplete color filling of the main pulmonary vein raising concern for portal vein thrombosis. Other: There is a small volume of abdominal ascites. IMPRESSION: 1. Gallbladder sludge without evidence for acute cholecystitis. 2. Incomplete color filling of the portal vein raises concern for portal venous thrombosis. This can be further evaluated with a contrast enhanced CT. 3. Coarsened heterogeneous appearance of the liver likely related to hepatocellular disease. 4. Small volume abdominal ascites. Electronically Signed   By:  Constance Holster M.D.   On: 12/03/2019 01:20        Scheduled Meds: . enoxaparin (LOVENOX) injection  40 mg Subcutaneous Q24H  . folic acid  1 mg Oral Daily  . multivitamin with minerals  1 tablet Oral Daily  . pantoprazole (PROTONIX) IV  40 mg Intravenous Q12H  . thiamine  100 mg Oral Daily   Continuous Infusions: . lactated ringers 250 mL/hr at 12/04/19 1757     LOS: 1 day    Time spent: Spent more than 30 minutes in coordinating care for this patient including bedside patient care.   Lucky Cowboy, MD [facility] If 7PM-7AM, please contact night-coverage 12/04/2019, 6:09 PM

## 2019-12-04 NOTE — Consult Note (Addendum)
Consultation  Referring Provider: Dr. Allena Katz    Primary Care Physician:  Patient, No Pcp Per Primary Gastroenterologist: Dr. Myrtie Neither       Reason for Consultation: Recurrent Pancreatitis            HPI:   Paul Allen is a 38 y.o. male with a past medical history significant for alcohol abuse and pancreatitis, who presented to the ER on 12/02/2019 with epigastric pain which has progressively worsened.    Today, the patient tells me that he had been having intermittent epigastric pain over the past few weeks, but this increased to a 9/10 and was sharp and continuous radiating to his lower abdomen and nothing was helping it so he proceeded to the ER.  Associated with nausea, vomiting and anorexia.    Tells me that he has had pancreatitis before but this is "way worse".  Nothing is helping his pain, he has been in the hospital for 40+ hours and has not been able to sleep for longer than 30 to 40 minutes at a time due to this pain which wakes him out of his sleep.  Tells me that he thinks he did better on a medication that started with a "D?  Dila.?".  Tells me he does not think anybody is coming at Christmas time to "get high".  Explains the pain medicine he is on is not touching it.  Describes that it radiates into his lower abdomen, in fact he is very tender to touch in the right lower quadrant per him, also tells me it radiates into his "penis and balls".  Patient tells me he is getting scared because he thinks something is "really going wrong inside me".  Swears up and down to me that he has not had an alcoholic drink for at least a month.    Patient does tell me that he has been on a clear liquid diet at home and lost over 15 to 20 pounds over the past month or so due to this pain.    Denies fever or chills.  ED course: Significantly elevated lipase in the 400s and his CT abdomen pelvis was consistent with acute pancreatitis complicated by pseudocyst; CMP with an AST of 44, ALT 272, total  bilirubin 2.1, CBC with a white count of 15.5, hemoglobin 20.3, ultrasound yesterday with gallbladder sludge without evidence for acute cholecystitis, incomplete color filling of the portal vein raises concern for portal venous thrombosis, coarsened heterogenous appearance of the liver likely related hepatocellular disease, small volume abdominal ascites  GI history: 11/28/2019 Doug Sou, PA-C office visit: At that time discussed the patient had experienced 3 episodes of pancreatitis since November of last year, had his last episode December 2 his total bilirubin was 2.2 on all 3 CT scans of patient's gallbladder and biliary tree had been unremarkable until the most recent which suggested mild common bile duct distention, did admit to using alcohol usually 3-5 beers on a daily basis but at that time described that he had not had any since October, at time of last CT scan showed a 63mm pancreatic pseudocyst that was developing also inflammatory changes in the distal stomach, proximal duodenum and hepatic flexure on different occasions that were thought secondary to pancreatic inflammation, as discussed this is likely from alcohol use he was advised to avoid alcohol labs are rechecked including CBC, CMP and lipase also recommend getting ultrasound of his gallbladder and biliary tree tramadol was given  Past Medical History:  Diagnosis Date  . Abdominal pain   . Nausea & vomiting   . Pancreatitis     Past Surgical History:  Procedure Laterality Date  . APPENDECTOMY      Family History  Problem Relation Age of Onset  . Cholecystitis Mother   . Arthritis Mother   . Stomach cancer Father     Social History   Tobacco Use  . Smoking status: Current Every Day Smoker    Packs/day: 1.50    Types: Cigarettes  . Smokeless tobacco: Never Used  Substance Use Topics  . Alcohol use: Not Currently  . Drug use: Yes    Frequency: 7.0 times per week    Types: Marijuana    Prior to Admission  medications   Medication Sig Start Date End Date Taking? Authorizing Provider  famotidine (PEPCID) 20 MG tablet Take 1 tablet (20 mg total) by mouth 2 (two) times daily. 11/20/19  Yes Mesner, Barbara CowerJason, MD  Multiple Vitamin (MULTIVITAMIN WITH MINERALS) TABS tablet Take 1 tablet by mouth daily. 10/28/18  Yes Regalado, Belkys A, MD  oxyCODONE-acetaminophen (PERCOCET) 5-325 MG tablet Take 2 tablets by mouth every 4 (four) hours as needed. Patient taking differently: Take 2 tablets by mouth every 4 (four) hours as needed for moderate pain or severe pain.  11/20/19  Yes Mesner, Barbara CowerJason, MD  pantoprazole (PROTONIX) 20 MG tablet Take 1 tablet (20 mg total) by mouth daily. 11/20/19  Yes Mesner, Barbara CowerJason, MD  traMADol (ULTRAM) 50 MG tablet Take 1 tablet (50 mg total) by mouth every 8 (eight) hours as needed. Patient taking differently: Take 50 mg by mouth every 8 (eight) hours as needed for moderate pain or severe pain.  11/28/19  Yes Zehr, Princella PellegriniJessica D, PA-C    Current Facility-Administered Medications  Medication Dose Route Frequency Provider Last Rate Last Admin  . 0.9 %  sodium chloride infusion   Intravenous Continuous Lorre NickAllen, Anthony, MD 125 mL/hr at 12/04/19 1005 New Bag at 12/04/19 1005  . acetaminophen (TYLENOL) tablet 650 mg  650 mg Oral Q6H PRN Leonette Nuttinghinbuah, Egya N, MD       Or  . acetaminophen (TYLENOL) suppository 650 mg  650 mg Rectal Q6H PRN Chinbuah, Egya N, MD      . enoxaparin (LOVENOX) injection 40 mg  40 mg Subcutaneous Q24H Liborio NixonPatel, Janki Y, MD      . folic acid (FOLVITE) tablet 1 mg  1 mg Oral Daily Chinbuah, Egya N, MD      . morphine 4 MG/ML injection 2 mg  2 mg Intravenous Q3H PRN Liborio NixonPatel, Janki Y, MD   2 mg at 12/04/19 1006  . multivitamin with minerals tablet 1 tablet  1 tablet Oral Daily Chinbuah, Egya N, MD      . ondansetron (ZOFRAN) tablet 4 mg  4 mg Oral Q6H PRN Chinbuah, Paula ComptonEgya N, MD       Or  . ondansetron (ZOFRAN) injection 4 mg  4 mg Intravenous Q6H PRN Leonette Nuttinghinbuah, Egya N, MD   4 mg at 12/04/19  1005  . oxyCODONE (Oxy IR/ROXICODONE) immediate release tablet 5 mg  5 mg Oral Q4H PRN Leonette Nuttinghinbuah, Egya N, MD   5 mg at 12/04/19 0044  . pantoprazole (PROTONIX) injection 40 mg  40 mg Intravenous Q12H Leonette Nuttinghinbuah, Egya N, MD   40 mg at 12/04/19 1006  . thiamine tablet 100 mg  100 mg Oral Daily Leonette Nuttinghinbuah, Egya N, MD        Allergies as of 12/02/2019 - Review Complete 12/02/2019  Allergen Reaction Noted  . Penicillins Rash 05/13/2018     Review of Systems:    Constitutional: No weight loss, fever or chills Skin: No rash  Cardiovascular: No chest pain  Respiratory: No SOB  Gastrointestinal: See HPI and otherwise negative Genitourinary: No dysuria  Neurological: No headache Musculoskeletal: No new muscle or joint pain Hematologic: No bleeding  Psychiatric: No history of depression or anxiety    Physical Exam:  Vital signs in last 24 hours: Temp:  [98.5 F (36.9 C)-98.7 F (37.1 C)] 98.7 F (37.1 C) (12/16 0441) Pulse Rate:  [66-82] 80 (12/16 0441) Resp:  [16-20] 20 (12/16 0441) BP: (130-146)/(80-97) 133/97 (12/16 0441) SpO2:  [96 %-100 %] 96 % (12/16 0441)   General:   Caucasian male appears to be in NAD, Well developed, Well nourished, alert and cooperative Head:  Normocephalic and atraumatic. Eyes:   PEERL, EOMI. No icterus. Conjunctiva pink. Ears:  Normal auditory acuity. Neck:  Supple Throat: Oral cavity and pharynx without inflammation, swelling or lesion.  Lungs: Respirations even and unlabored. Lungs clear to auscultation bilaterally.   No wheezes, crackles, or rhonchi.  Heart: Normal S1, S2. No MRG. Regular rate and rhythm. No peripheral edema, cyanosis or pallor.  Abdomen:  Soft, nondistended, marked RLQ ttp to only light palpation with voluntary guarding (patient screams out in pain), also marked ttp in epigastrum. Normal bowel sounds. No appreciable masses or hepatomegaly. Rectal:  Not performed.  Msk:  Symmetrical without gross deformities. Peripheral pulses intact.    Extremities:  Without edema, no deformity or joint abnormality.  Neurologic:  Alert and  oriented x4;  grossly normal neurologically.  Skin:   Dry and intact without significant lesions or rashes. Psychiatric: Without abnormal affect or behaviors.  LAB RESULTS: Recent Labs    12/02/19 2010 12/04/19 0528  WBC 15.5* 14.7*  HGB 20.3* 17.6*  HCT 57.9* 53.4*  PLT 272 221   BMET Recent Labs    12/02/19 2010 12/03/19 0503 12/04/19 0528  NA 133* 133* 133*  K 3.5 3.7 4.4  CL 88* 94* 94*  CO2 GLUCOSE 112* 116* 121*  BUN CREATININE 0.56* 0.52* 0.47*  CALCIUM 9.4 8.5* 8.6*   LFT Recent Labs    12/04/19 0528  PROT 6.0*  ALBUMIN 3.1*  AST 265*  ALT 384*  ALKPHOS 110  BILITOT 2.0*   STUDIES: RUQ [place]  Result Date: 12/03/2019 CLINICAL DATA:  Right upper quadrant abdominal pain. EXAM: ULTRASOUND ABDOMEN LIMITED RIGHT UPPER QUADRANT COMPARISON:  CT dated November 20, 2019 FINDINGS: Gallbladder: There is diffuse gallbladder sludge without evidence for gallbladder wall thickening or pericholecystic free fluid. The sonographic Eulah Pont sign is negative. The gallbladder is somewhat distended. Common bile duct: Diameter: 4 mm Liver: The liver parenchyma is coarsened and heterogeneous. There is hepatopetal flow within the main pulmonary vein. There is incomplete color filling of the main pulmonary vein raising concern for portal vein thrombosis. Other: There is a small volume of abdominal ascites. IMPRESSION: 1. Gallbladder sludge without evidence for acute cholecystitis. 2. Incomplete color filling of the portal vein raises concern for portal venous thrombosis. This can be further evaluated with a contrast enhanced CT. 3. Coarsened heterogeneous appearance of the liver likely related to hepatocellular disease. 4. Small volume abdominal ascites. Electronically Signed   By: Katherine Mantle M.D.   On: 12/03/2019 01:20    Impression / Plan:   Impression: 1.  Recurrent  pancreatitis: This is patient's third or fourth episode  over the past year, last admission was in early December, patient tells me pain never went away, now with elevation in LFTs and gallbladder sludge some concern over choledocholithiasis versus continued alcoholic pancreatitis 2.  Question portal vein thrombosis: On ultrasound, will evaluate further with MRI 3.  Elevated LFTs 4.  Chronic alcohol abuse: Patient denies any alcohol use for a month  Plan: 1.  Discussed case with Dr. Bryan Lemma.  Recommend MRI/MRCP as next step in evaluation for possible choledocholithiasis given elevation in LFTs.  We will also ask them to evaluate portal vein thrombosis at that time.  Did discuss with MRI and they confirmed that they can do this. 2.  We will leave pain medication to hospitalist team, though could consider PCA? 3.  Continue antiemetics and fluids.  Patient should remain n.p.o. for now. 4. Changed fluids to Lactated Ringers at 215ml./hr 5.  Please await any further recommendations from Dr. Bryan Lemma later today.  Thank you for your kind consultation, we will continue to follow.  Lavone Nian Arion Morgan  12/04/2019, 10:14 AM

## 2019-12-04 NOTE — Progress Notes (Signed)
Initial Nutrition Assessment  RD working remotely.   DOCUMENTATION CODES:   Not applicable  INTERVENTION:  - diet advancement as medically feasible. - if unable to advance diet past CLD within the next 4-5 days, consider placement of small bore NGT to initiate TF.    NUTRITION DIAGNOSIS:   Inadequate oral intake related to inability to eat as evidenced by NPO status.  GOAL:   Patient will meet greater than or equal to 90% of their needs  MONITOR:   Diet advancement, Labs, Weight trends, I & O's  REASON FOR ASSESSMENT:   Malnutrition Screening Tool  ASSESSMENT:   38 y.o. male with medical history significant of history of alcohol abuse and pancreatitis. He has been experiencing epigastric pain for the past few weeks and was diagnosed outpatient with acute pancreatitis. His pain progressively worsened so he presented to the ED.  Patient has been NPO since admission. He reports ongoing, worsening abdominal pain with N/V and very poor appetite for the past 1 month. He is unable to recall the last time he ate an actual meal and states that it has been several weeks since he consumed solid food; reports being on CLD at home.   He reports that in the past 1 month he has lost 15-20 lb. Per chart review, current weight is 164 lb and weight on 12/2 was 170 lb. This indicates 6 lb weight loss (3.5% body weight) in the past 2 weeks; significant for time frame. Patient at very high risk for malnutrition.  Per notes: - acute pancreatitis--hx of alcohol abuse but denies alcohol consumption in the past 1 month.  - mild hyponatremia--thought to be 2/2 GI losses - dehydration - acute gastritis/duodenitis - GI consulted--recommendation for MRI/MRCP to evaluate for possible choledocholithiasis, plan to keep patient NPO   Labs reviewed; Na: 133 mmol/l, Cl: 94 mmol/l, creatinine: 0.47 mg/dl, Ca: 8.6 mg/dl, LFTs elevated and up from date of last lab draw (12/11), lipase: 354  units/L. Medications reviewed; 1 mg folvite/day, daily multivitamin with minerals, 40 mg IV protonix BID, 100 mg oral thiamine/day.  IVF; LR @ 250 ml/hr.     NUTRITION - FOCUSED PHYSICAL EXAM:  unable to complete at this time.   Diet Order:   Diet Order            Diet NPO time specified Except for: Ice Chips  Diet effective now              EDUCATION NEEDS:   Not appropriate for education at this time  Skin:  Skin Assessment: Reviewed RN Assessment  Last BM:     Height:   Ht Readings from Last 1 Encounters:  12/02/19 5\' 9"  (1.753 m)    Weight:   Wt Readings from Last 1 Encounters:  12/02/19 74.4 kg    Ideal Body Weight:  72.7 kg  BMI:  Body mass index is 24.22 kg/m.  Estimated Nutritional Needs:   Kcal:  8756-4332 kcal  Protein:  105-120 grams  Fluid:  >/= 2.3 L/day      Jarome Matin, MS, RD, LDN, [facility] Inpatient Clinical Dietitian Pager # [phone] After hours/weekend pager # [phone]

## 2019-12-05 ENCOUNTER — Ambulatory Visit (HOSPITAL_COMMUNITY): Payer: Self-pay

## 2019-12-05 DIAGNOSIS — R188 Other ascites: Secondary | ICD-10-CM

## 2019-12-05 DIAGNOSIS — K8521 Alcohol induced acute pancreatitis with uninfected necrosis: Principal | ICD-10-CM

## 2019-12-05 DIAGNOSIS — I81 Portal vein thrombosis: Secondary | ICD-10-CM

## 2019-12-05 DIAGNOSIS — R1013 Epigastric pain: Secondary | ICD-10-CM

## 2019-12-05 LAB — COMPREHENSIVE METABOLIC PANEL
ALT: 189 U/L — ABNORMAL HIGH (ref 0–44)
AST: 67 U/L — ABNORMAL HIGH (ref 15–41)
Albumin: 3 g/dL — ABNORMAL LOW (ref 3.5–5.0)
Alkaline Phosphatase: 91 U/L (ref 38–126)
Anion gap: 13 (ref 5–15)
BUN: 8 mg/dL (ref 6–20)
CO2: 28 mmol/L (ref 22–32)
Calcium: 8.4 mg/dL — ABNORMAL LOW (ref 8.9–10.3)
Chloride: 92 mmol/L — ABNORMAL LOW (ref 98–111)
Creatinine, Ser: 0.48 mg/dL — ABNORMAL LOW (ref 0.61–1.24)
GFR calc Af Amer: >60 mL/min (ref >60)
GFR calc non Af Amer: >60 mL/min (ref >60)
Glucose, Bld: 94 mg/dL (ref 70–99)
Potassium: 3.6 mmol/L (ref 3.5–5.1)
Sodium: 133 mmol/L — ABNORMAL LOW (ref 135–145)
Total Bilirubin: 2.7 mg/dL — ABNORMAL HIGH (ref 0.3–1.2)
Total Protein: 5.9 g/dL — ABNORMAL LOW (ref 6.5–8.1)

## 2019-12-05 LAB — CBC
HCT: 47.8 % (ref 39.0–52.0)
Hemoglobin: 15.9 g/dL (ref 13.0–17.0)
MCH: 32.6 pg (ref 26.0–34.0)
MCHC: 33.3 g/dL (ref 30.0–36.0)
MCV: 98.2 fL (ref 80.0–100.0)
Platelets: 229 10*3/uL (ref 150–400)
RBC: 4.87 MIL/uL (ref 4.22–5.81)
RDW: 13.9 % (ref 11.5–15.5)
WBC: 11.3 10*3/uL — ABNORMAL HIGH (ref 4.0–10.5)
nRBC: 0 % (ref 0.0–0.2)

## 2019-12-05 LAB — PROTIME-INR
INR: 1.3 — ABNORMAL HIGH (ref 0.8–1.2)
Prothrombin Time: 16.3 seconds — ABNORMAL HIGH (ref 11.4–15.2)

## 2019-12-05 LAB — HEPARIN LEVEL (UNFRACTIONATED): Heparin Unfractionated: 0.2 IU/mL — ABNORMAL LOW (ref 0.30–0.70)

## 2019-12-05 LAB — ERYTHROPOIETIN: Erythropoietin: 10.7 m[IU]/mL (ref 2.6–18.5)

## 2019-12-05 MED ORDER — HYDROMORPHONE HCL 1 MG/ML IJ SOLN
1.0000 mg | INTRAMUSCULAR | Status: DC | PRN
  Administered 2019-12-05 – 2019-12-15 (×32): 1 mg via INTRAVENOUS
  Filled 2019-12-05 (×36): qty 1

## 2019-12-05 MED ORDER — ENOXAPARIN SODIUM 40 MG/0.4ML ~~LOC~~ SOLN
40.0000 mg | SUBCUTANEOUS | Status: DC
  Administered 2019-12-05: 40 mg via SUBCUTANEOUS
  Filled 2019-12-05: qty 0.4

## 2019-12-05 MED ORDER — ENOXAPARIN SODIUM 80 MG/0.8ML ~~LOC~~ SOLN: 1.0000 mg/kg | Freq: Two times a day (BID) | SUBCUTANEOUS | Status: DC

## 2019-12-05 MED ORDER — OCTREOTIDE ACETATE 100 MCG/ML IJ SOLN
100.0000 ug | Freq: Two times a day (BID) | INTRAMUSCULAR | Status: DC
  Administered 2019-12-05 – 2019-12-09 (×9): 100 ug via SUBCUTANEOUS
  Filled 2019-12-05 (×9): qty 1

## 2019-12-05 MED ORDER — OXYCODONE HCL 5 MG PO TABS
10.0000 mg | ORAL_TABLET | Freq: Four times a day (QID) | ORAL | Status: DC | PRN
  Administered 2019-12-05 – 2019-12-15 (×21): 10 mg via ORAL
  Filled 2019-12-05 (×24): qty 2

## 2019-12-05 MED ORDER — LIP MEDEX EX OINT
TOPICAL_OINTMENT | CUTANEOUS | Status: AC
  Administered 2019-12-05: 1
  Filled 2019-12-05: qty 7

## 2019-12-05 MED ORDER — SODIUM CHLORIDE 0.9 % IV SOLN
1.0000 g | Freq: Three times a day (TID) | INTRAVENOUS | Status: DC
  Administered 2019-12-05 – 2019-12-13 (×24): 1 g via INTRAVENOUS
  Filled 2019-12-05 (×25): qty 1

## 2019-12-05 MED ORDER — HEPARIN (PORCINE) 25000 UT/250ML-% IV SOLN
1450.0000 [IU]/h | INTRAVENOUS | Status: AC
  Administered 2019-12-05: 1300 [IU]/h via INTRAVENOUS
  Filled 2019-12-05 (×2): qty 250

## 2019-12-05 MED ORDER — NICOTINE 7 MG/24HR TD PT24
7.0000 mg | MEDICATED_PATCH | Freq: Every day | TRANSDERMAL | Status: DC
  Administered 2019-12-05 – 2019-12-15 (×11): 7 mg via TRANSDERMAL
  Filled 2019-12-05 (×11): qty 1

## 2019-12-05 NOTE — Progress Notes (Signed)
Progress Note   Subjective  Chief Complaint: Recurrent pancreatitis  Today, the patient explains that he continues with abdominal pain which the morphine is barely touching, this radiates into his back and through his stomach.  Explains that he was able to pass gas a few times and has urinated, though this is "not as much as I thought would be".   Objective   Vital signs in last 24 hours: Temp:  [98 F (36.7 C)-99.5 F (37.5 C)] 99.5 F (37.5 C) (12/17 0505) Pulse Rate:  [76-77] 76 (12/17 0505) Resp:  [12-16] 16 (12/17 0505) BP: (125-131)/(78-83) 125/78 (12/17 0505) SpO2:  [93 %-98 %] 93 % (12/17 0505)   General:    white male in NAD Heart:  Regular rate and rhythm; no murmurs Lungs: Respirations even and unlabored, lungs CTA bilaterally Abdomen:  Soft, Marked epigastric ttp, Moderate generalized ttp and mild distension. Normal bowel sounds. Extremities:  Without edema. Neurologic:  Alert and oriented,  grossly normal neurologically. Psych:  Cooperative. Normal mood and affect.  Intake/Output from previous day: 12/16 0701 - 12/17 0700 In: -  Out: [place] [Urine:1325]  Lab Results: Recent Labs    12/02/19 2010 12/04/19 0528 12/05/19 0935  WBC 15.5* 14.7* 11.3*  HGB 20.3* 17.6* 15.9  HCT 57.9* 53.4* 47.8  PLT 272 221 229   BMET Recent Labs    12/03/19 0503 12/04/19 0528 12/05/19 0935  NA 133* 133* 133*  K 3.7 4.4 3.6  CL 94* 94* 92*  CO2 26 26 28   GLUCOSE 116* 121* 94  BUN 13 10 8   CREATININE 0.52* 0.47* 0.48*  CALCIUM 8.5* 8.6* 8.4*   LFT Recent Labs    12/05/19 0935  PROT 5.9*  ALBUMIN 3.0*  AST 67*  ALT 189*  ALKPHOS 91  BILITOT 2.7*   PT/INR Recent Labs    12/05/19 0935  LABPROT 16.3*  INR 1.3*    Studies/Results: MR 3D Recon At Scanner  Result Date: 12/05/2019 CLINICAL DATA:  Acute abdominal pain, history of pancreatitis. EXAM: MRI ABDOMEN WITHOUT AND WITH CONTRAST (INCLUDING MRCP) TECHNIQUE: Multiplanar multisequence MR imaging of  the abdomen was performed both before and after the administration of intravenous contrast. Heavily T2-weighted images of the biliary and pancreatic ducts were obtained, and three-dimensional MRCP images were rendered by post processing. CONTRAST:  48mL GADAVIST GADOBUTROL 1 MMOL/ML IV SOLN COMPARISON:  CT evaluation of 11/20/2019 FINDINGS: Lower chest: Trace pleural fluid on the left. No pericardial effusion. Limited assessment of the lung bases on MRI. Hepatobiliary: MRCP images markedly limited secondary to diffuse intra-abdominal ascites and extensive edema. Extensive cholelithiasis and sludge in the dependent gallbladder. No large filling defect is seen in the biliary tree in the biliary tree is nondilated. See below for pancreatic findings of note. There is diffuse perihepatic ascites including fluid in the lesser sac about the caudate in the hepato gastric recess. Diffusely abnormal enhancement of the liver on the arterial phase of the study. Patency of arterial vessels is not well assessed but there is evidence of portal thrombosis and SMV thrombosis, left portal vein is completely thrombosed though has some flow around the thrombus within the lumen period right posterior division portal branches are thrombosed with partial thrombus of anterior division branches. The central main portal vein and right portal venous branches remain opacified with contrast, not yet completely thrombosed. The SMV thrombus is sparing peripheral venous branches in the mesentery, is more central and extends along approximately 3 cm of the SMV, extending a  short ways into the main portal vein. There are developing collaterals in short gastric and gastroepiploic branches. The central liver in areas subtended by thrombosed portal venous branches does not enhance to the same extent as other areas, most pronounced on the arterial phase data set involving mainly the right hepatic lobe but also medial segment of left hepatic lobe. Finding  is predominantly central within the liver, best seen on image 43 is of the arterial phase portion of the exam. Heterogeneous enhancement normalizes to a large extent on venous phase with persistent heterogeneity of the posterior right hemi liver in hepatic subsegment VI and VII, adjacent to pancreatic fluid in Morrison's pouch, which is measures approximately 6.1 x 3.0 with striated appearance. This is in a segment of the liver subtended by a thrombosed right posterior portal venous branch best seen on image 40 of the venous phase postcontrast data set. Small thrombi are also likely present in the anterior right portal division seen on image 35 of venous phase imaging. The left portal vein is nearly completely occluded. Pancreas: There is a defect in the neck of the pancreas which communicates with a lobulated fluid collection that tracks into the lesser sac. The duct in the pancreatic neck at the site of the defect is lost within the pancreatic fluid. In total this area measures 3 x 2.3 cm. Fluid in the hepatic gastric recess of the lesser sac measures 6.2 x 3.6 cm. Fluid tracking along the splenorenal ligament into the left upper quadrant beneath the left hemidiaphragm, dominant collection measures 6.8 x 4.9 cm with another cylindrical tract extending towards a smaller collection in the left subdiaphragmatic space, this measures approximately 3.8 x 3.0 cm with the tract extending along approximately a 3.3 cm path bridging these 2 collections. Fluid in the anterior abdomen within the transverse mesocolon measures 5.0 x 2.8 cm. Loculated collections described above show increased signal on T1 compatible with proteinaceous or hemorrhagic contents particularly areas in the main portion of the lesser sac adjacent to stomach and spleen and in the transverse mesocolon. Diffuse intra-abdominal ascites is present along with extensive edema. Postcontrast the defect in the neck of the pancreas is best seen on image 57 of the  arterial phase measuring approximately 17 cm in greatest with. No signs of ductal dilation in the setting of presumed ductal interruption given other findings described above. Spleen: Splenic vein remains patent in the setting of SMV and portal thrombosis. Spleen is mildly enlarged but otherwise unremarkable. Adrenals/Urinary Tract:  Adrenal glands are normal. Kidneys with smooth contour show normal enhancement. Stomach/Bowel: Extensive edema of stomach and transverse colon in particular. Vascular/Lymphatic: Venous thrombosis with thrombus in the SMV, extending into main portal vein, also in the left and right portal branches greatest on the left. The peripheral splenic vein is patent. Thrombosis of right portal branches Other:  Diffuse pancreatic ascites as described. Musculoskeletal: Normal appearance of visualized bones. IMPRESSION: Necrotic pancreatitis with ductal interruption in the neck of the pancreas resulting in hemorrhagic or proteinaceous pancreatic collections in the lesser sac and in upper abdominal mesenteric structures and diffuse pancreatic ascites. 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 There is still some patency of main portal vein and right portal branches. Findings of hepatic inflammation versus early hepatic necrosis in the right hepatic lobe with markedly abnormal enhancement of the liver on arterial phase imaging, becoming more normal on later phase with the exception of the area in  the posterior right hemi liver. Extensive bowel edema worsened areas directly affected by pancreatic collections, particularly stomach and transverse colon. No signs of biliary ductal dilation with limited assessment of the biliary tree. No filling defects seen in the common bile duct but with numerous gallstones and evidence of sludge in the dependent portion of the gallbladder. Critical Value/emergent results were called by telephone  at the time of interpretation on 12/05/2019 at 8:21 am to provider[facility]ENNIFER [facility] , who verbally acknowledged these results. Electronically Signed   By: Donzetta KohutGeoffrey  Wile M.D.   On: 12/05/2019 08:25   MR ABDOMEN MRCP W WO CONTAST  Result Date: 12/05/2019 CLINICAL DATA:  Acute abdominal pain, history of pancreatitis. EXAM: MRI ABDOMEN WITHOUT AND WITH CONTRAST (INCLUDING MRCP) TECHNIQUE: Multiplanar multisequence MR imaging of the abdomen was performed both before and after the administration of intravenous contrast. Heavily T2-weighted images of the biliary and pancreatic ducts were obtained, and three-dimensional MRCP images were rendered by post processing. CONTRAST:  7mL GADAVIST GADOBUTROL 1 MMOL/ML IV SOLN COMPARISON:  CT evaluation of 11/20/2019 FINDINGS: Lower chest: Trace pleural fluid on the left. No pericardial effusion. Limited assessment of the lung bases on MRI. Hepatobiliary: MRCP images markedly limited secondary to diffuse intra-abdominal ascites and extensive edema. Extensive cholelithiasis and sludge in the dependent gallbladder. No large filling defect is seen in the biliary tree in the biliary tree is nondilated. See below for pancreatic findings of note. There is diffuse perihepatic ascites including fluid in the lesser sac about the caudate in the hepato gastric recess. Diffusely abnormal enhancement of the liver on the arterial phase of the study. Patency of arterial vessels is not well assessed but there is evidence of portal thrombosis and SMV thrombosis, left portal vein is completely thrombosed though has some flow around the thrombus within the lumen period right posterior division portal branches are thrombosed with partial thrombus of anterior division branches. The central main portal vein and right portal venous branches remain opacified with contrast, not yet completely thrombosed. The SMV thrombus is sparing peripheral venous branches in the mesentery, is more central and extends  along approximately 3 cm of the SMV, extending a short ways into the main portal vein. There are developing collaterals in short gastric and gastroepiploic branches. The central liver in areas subtended by thrombosed portal venous branches does not enhance to the same extent as other areas, most pronounced on the arterial phase data set involving mainly the right hepatic lobe but also medial segment of left hepatic lobe. Finding is predominantly central within the liver, best seen on image 43 is of the arterial phase portion of the exam. Heterogeneous enhancement normalizes to a large extent on venous phase with persistent heterogeneity of the posterior right hemi liver in hepatic subsegment VI and VII, adjacent to pancreatic fluid in Morrison's pouch, which is measures approximately 6.1 x 3.0 with striated appearance. This is in a segment of the liver subtended by a thrombosed right posterior portal venous branch best seen on image 40 of the venous phase postcontrast data set. Small thrombi are also likely present in the anterior right portal division seen on image 35 of venous phase imaging. The left portal vein is nearly completely occluded. Pancreas: There is a defect in the neck of the pancreas which communicates with a lobulated fluid collection that tracks into the lesser sac. The duct in the pancreatic neck at the site of the defect is lost within the pancreatic fluid. In total this area measures 3 x  2.3 cm. Fluid in the hepatic gastric recess of the lesser sac measures 6.2 x 3.6 cm. Fluid tracking along the splenorenal ligament into the left upper quadrant beneath the left hemidiaphragm, dominant collection measures 6.8 x 4.9 cm with another cylindrical tract extending towards a smaller collection in the left subdiaphragmatic space, this measures approximately 3.8 x 3.0 cm with the tract extending along approximately a 3.3 cm path bridging these 2 collections. Fluid in the anterior abdomen within the  transverse mesocolon measures 5.0 x 2.8 cm. Loculated collections described above show increased signal on T1 compatible with proteinaceous or hemorrhagic contents particularly areas in the main portion of the lesser sac adjacent to stomach and spleen and in the transverse mesocolon. Diffuse intra-abdominal ascites is present along with extensive edema. Postcontrast the defect in the neck of the pancreas is best seen on image 57 of the arterial phase measuring approximately 17 cm in greatest with. No signs of ductal dilation in the setting of presumed ductal interruption given other findings described above. Spleen: Splenic vein remains patent in the setting of SMV and portal thrombosis. Spleen is mildly enlarged but otherwise unremarkable. Adrenals/Urinary Tract:  Adrenal glands are normal. Kidneys with smooth contour show normal enhancement. Stomach/Bowel: Extensive edema of stomach and transverse colon in particular. Vascular/Lymphatic: Venous thrombosis with thrombus in the SMV, extending into main portal vein, also in the left and right portal branches greatest on the left. The peripheral splenic vein is patent. Thrombosis of right portal branches Other:  Diffuse pancreatic ascites as described. Musculoskeletal: Normal appearance of visualized bones. IMPRESSION: Necrotic pancreatitis with ductal interruption in the neck of the pancreas resulting in hemorrhagic or proteinaceous pancreatic collections in the lesser sac and in upper abdominal mesenteric structures and diffuse pancreatic ascites. 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 There is still some patency of main portal vein and right portal branches. Findings of hepatic inflammation versus early hepatic necrosis in the right hepatic lobe with markedly abnormal enhancement of the liver on arterial phase imaging, becoming more normal on later phase with the exception of the area in  the posterior right hemi liver. Extensive bowel edema worsened areas directly affected by pancreatic collections, particularly stomach and transverse colon. No signs of biliary ductal dilation with limited assessment of the biliary tree. No filling defects seen in the common bile duct but with numerous gallstones and evidence of sludge in the dependent portion of the gallbladder. Critical Value/emergent results were called by telephone at the time of interpretation on 12/05/2019 at 8:21 am to providerJENNIFER [facility] , who verbally acknowledged these results. Electronically Signed   By: Donzetta Kohut M.D.   On: 12/05/2019 08:25     Assessment / Plan:   Assessment: 1.  Acute recurrent pancreatitis: Due to alcohol, with developing necrosis and portal vein as well as SMV thrombosis as well as ductal interruption in the neck of the pancreas, see MRI above 2.  Elevated LFTs: Likely due to above 3.  Hyperbilirubinemia 4.  Portal vein thrombosis and SMV thrombosis 5.  Alcohol use disorder  Plan: 1.  Scheduled patient for an ERCP 12/06/2019 at 11 am with Dr. Christella Hartigan.  Did discuss risks, benefits, limitations and alternatives and patient agrees to proceed.  This will be in an attempt to place a pancreatic duct stent. 2.  Placed pharmacy consult to start a heparin drip today after discussion with hematology.  This will also need to be stopped  at 7:00 tomorrow morning as we prepare for procedure above.  They recommend patient leaving the hospital on Eliquis or Xarelto, which ever is least expensive for them. 3.  Ordered a nicotine patch and a shower per patient request 4.  Started the patient on Meropenem per pharmacy consult 5.  Discussed case with hospitalist team.  They will be in charge of his pain management. 6.  Please await any further recommendations from Dr. Barron Alvine later today.  Thank you for kind consultation, we will continue to follow.   LOS: 2 days   Unk Lightning  12/05/2019,  10:46 AM

## 2019-12-05 NOTE — Progress Notes (Signed)
Pharmacy Antibiotic Note  Paul Allen is a 38 y.o. male admitted on 12/02/2019 with bile duct leak.  Pharmacy has been consulted for meropenem dosing.  Plan:  Meropenem 1 gr IV q8h    Monitor clinical course, renal function, cultures as available   Height: 5\' 9"  (175.3 cm) Weight: 164 lb (74.4 kg) IBW/kg (Calculated) : 70.7  Temp (24hrs), Avg:98.8 F (37.1 C), Min:98 F (36.7 C), Max:99.5 F (37.5 C)  Recent Labs  Lab 11/28/19 1545 12/02/19 2010 12/03/19 0503 12/04/19 0528 12/05/19 0935  WBC 9.6 15.5*  --  14.7* 11.3*  CREATININE 0.58 0.56* 0.52* 0.47* 0.48*    Estimated Creatinine Clearance: 124 mL/min (A) (by C-G formula based on SCr of 0.48 mg/dL (L)).    Allergies  Allergen Reactions  . Penicillins Rash    Has patient had a PCN reaction causing immediate rash, facial/tongue/throat swelling, SOB or lightheadedness with hypotension: yes Has patient had a PCN reaction causing severe rash involving mucus membranes or skin necrosis: Yes Has patient had a PCN reaction that required hospitalization: No Has patient had a PCN reaction occurring within the last 10 years: No If all of the above answers are "NO", then may proceed with Cephalosporin use.     Antimicrobials this admission: 12/17 meropenem >>   Dose adjustments this admission:   Microbiology results: 12/15 COVID-19: Negative  12/15 HIV antibody: negative   Thank you for allowing pharmacy to be a part of this patient's care.   Royetta Asal, PharmD, BCPS 12/05/2019 11:19 AM

## 2019-12-05 NOTE — Progress Notes (Signed)
PROGRESS NOTE    Paul Allen  ZOX:[id number] DOB: 05-Aug-1981 DOA: 12/02/2019 PCP: Patient, No Pcp Per    Brief Narrative:  38 year old Caucasian male with history of alcohol abuse admitted with acute pancreatitis.   Assessment & Plan:   Active Problems:   Pancreatitis, acute   Elevated LFTs   Elevated hemoglobin (HCC)  Acute pancreatitis Presumed to be secondary to alcohol.  Continues to have persistent pain and elevated lipase while of alcohol per patient since November 1.  Had a CT abdomen pelvis recently for similar complaints for which she had a CT abdomen pelvis that showed acute pancreatitis and inflammation of the hepatic flexure.  Right upper quadrant ultrasound this admission showed gallbladder sludge with no evidence of acute cholecystitis but incomplete color filling of portal vein concerning for portal vein thrombosis. GI on board. Appreciate assistance. MRI abdomen showing extensive portal vein thrombosis, pancreatic fluid leak. Plan for ERCP tomorrow for pancreatic stent placement. Meropenem, octreotide Continue IV fluids Pain management as needed. Change regimen to dilaudid and increased dose of oxycodone PRN N.p.o. for now  Extensive portal vein/ SMV thrombosis ?complication of acute pancreatitis  Started on full anticoagulation with heparin   Elevated hemoglobin Unclear etiology though it could be in setting of volume depletion and him being a current cigarette smoker Hb remains elevated despite hydration. erythropoietin pending. Ferritin in 500's erythropoetin normal   Elevated LFTs ?hepatic injury from portal vein thrombosis    DVT prophylaxis: WJ:[id number] gtt Code Status: Full code  Family Communication: mother at bedside but patient requested his mother step out since he wanted to talk personally to his mother  Disposition Plan: Pending medical stability   Consultants:   GI  Procedures:  None  Antimicrobials:   Meropenem     Subjective: No acute events overnight.  Patient seen and examined today.  Complains of continued abdominal pain. Reports nausea and non bloody vomiting. No other complains.   Objective: Vitals:   12/04/19 0441 12/04/19 1702 12/04/19 2105 12/05/19 0505  BP: (!) 133/97 131/83 130/82 125/78  Pulse: 80 76 77 76  Resp: Temp: 98.7 F (37.1 C) 98 F (36.7 C) 98.9 F (37.2 C) 99.5 F (37.5 C)  TempSrc: Oral Oral Oral Oral  SpO2: 96% 98% 97% 93%  Weight:      Height:        Intake/Output Summary (Last 24 hours) at 12/05/2019 0842 Last data filed at 12/05/2019 0240 Gross per 24 hour  Intake --  Output 1325 ml  Net -1325 ml   Filed Weights   12/02/19 1933  Weight: 74.4 kg    Examination:  General exam: Appears calm and comfortable  Respiratory system: Clear to auscultation. Respiratory effort normal. Cardiovascular system: S1 & S2 heard, RRR. No JVD, murmurs, rubs, gallops or clicks. No pedal edema. Gastrointestinal system: Abdomen is nondistended, soft and epigastric and RUQ tenderness. Normal bowel sounds heard. Central nervous system: Alert and oriented. No focal neurological deficits. Extremities: Symmetric 5 x 5 power. Skin: No rashes, lesions or ulcers Psychiatry:  Mood & affect appropriate.   Data Reviewed: I have personally reviewed following labs and imaging studies  CBC: Recent Labs  Lab 11/28/19 1545 12/02/19 2010 12/04/19 0528  WBC 9.6 15.5* 14.7*  NEUTROABS 6.5  --   --   HGB 19.4 Repeated and verified X2.* 20.3* 17.6*  HCT 57.7 Repeated and verified X2.* 57.9* 53.4*  MCV 96.5 96.2 98.7  PLT 271.0 272 221   Basic  Metabolic Panel: Recent Labs  Lab 11/28/19 1545 12/02/19 2010 12/03/19 0503 12/04/19 0528  NA 138 133* 133* 133*  K 3.4* 3.5 3.7 4.4  CL 96 88* 94* 94*  CO2 30 29 26 26   GLUCOSE 101* 112* 116* 121*  BUN 9 13 13 10   CREATININE 0.58 0.56* 0.52* 0.47*  CALCIUM 9.8 9.4 8.5* 8.6*   GFR: Estimated Creatinine Clearance:  124 mL/min (A) (by C-G formula based on SCr of 0.47 mg/dL (L)). Liver Function Tests: Recent Labs  Lab 11/28/19 1545 12/02/19 2010 12/03/19 0503 12/04/19 0528  AST 29 84* 484* 265*  ALT 27 66* 272* 384*  ALKPHOS 117 107 91 110  BILITOT 1.2 1.6* 2.1* 2.0*  PROT 7.7 7.7 6.4* 6.0*  ALBUMIN 4.3 4.2 3.5 3.1*   Recent Labs  Lab 11/28/19 1545 12/02/19 2010 12/03/19 0503  LIPASE 232.0* 435* 354*   No results for input(s): AMMONIA in the last 168 hours. Coagulation Profile: No results for input(s): INR, PROTIME in the last 168 hours. Cardiac Enzymes: No results for input(s): CKTOTAL, CKMB, CKMBINDEX, TROPONINI in the last 168 hours. BNP (last 3 results) No results for input(s): PROBNP in the last 8760 hours. HbA1C: No results for input(s): HGBA1C in the last 72 hours. CBG: No results for input(s): GLUCAP in the last 168 hours. Lipid Profile: No results for input(s): CHOL, HDL, LDLCALC, TRIG, CHOLHDL, LDLDIRECT in the last 72 hours. Thyroid Function Tests: No results for input(s): TSH, T4TOTAL, FREET4, T3FREE, THYROIDAB in the last 72 hours. Anemia Panel: Recent Labs    12/04/19 0528  FERRITIN 507*   Sepsis Labs: No results for input(s): PROCALCITON, LATICACIDVEN in the last 168 hours.  Recent Results (from the past 240 hour(s))  SARS CORONAVIRUS 2 (TAT 6-24 HRS) Nasopharyngeal Nasopharyngeal Swab     Status: None   Collection Time: 12/03/19 12:42 AM   Specimen: Nasopharyngeal Swab  Result Value Ref Range Status   SARS Coronavirus 2 NEGATIVE NEGATIVE Final    Comment: (NOTE) SARS-CoV-2 target nucleic acids are NOT DETECTED. The SARS-CoV-2 RNA is generally detectable in upper and lower respiratory specimens during the acute phase of infection. Negative results do not preclude SARS-CoV-2 infection, do not rule out co-infections with other pathogens, and should not be used as the sole basis for treatment or other patient management decisions. Negative results must be  combined with clinical observations, patient history, and epidemiological information. The expected result is Negative. Fact Sheet for Patients: SugarRoll.be Fact Sheet for Healthcare Providers: https://www.woods-mathews.com/ This test is not yet approved or cleared by the [place] FDA and  has been authorized for detection and/or diagnosis of SARS-CoV-2 by FDA under an Emergency Use Authorization (EUA). This EUA will remain  in effect (meaning this test can be used) for the duration of the COVID-19 declaration under Section 56 4(b)(1) of the Act, 21 U.S.C. section 360bbb-3(b)(1), unless the authorization is terminated or revoked sooner. Performed at [facility], [place] [address] [place], [place] [address]          Radiology Studies: MR 3D Recon At Scanner  Result Date: 12/05/2019 CLINICAL DATA:  Acute abdominal pain, history of pancreatitis. EXAM: MRI ABDOMEN WITHOUT AND WITH CONTRAST (INCLUDING MRCP) TECHNIQUE: Multiplanar multisequence MR imaging of the abdomen was performed both before and after the administration of intravenous contrast. Heavily T2-weighted images of the biliary and pancreatic ducts were obtained, and three-dimensional MRCP images were rendered by post processing. CONTRAST:  76mL GADAVIST GADOBUTROL 1 MMOL/ML IV SOLN COMPARISON:  CT evaluation of 11/20/2019 FINDINGS: Lower chest: Trace pleural fluid on the left. No pericardial effusion. Limited assessment of the lung bases on MRI. Hepatobiliary: MRCP images markedly limited secondary to diffuse intra-abdominal ascites and extensive edema. Extensive cholelithiasis and sludge in the dependent gallbladder. No large filling defect is seen in the biliary tree in the biliary tree is nondilated. See below for pancreatic findings of note. There is diffuse perihepatic ascites including fluid in the lesser sac about the caudate in the hepato gastric recess. Diffusely abnormal  enhancement of the liver on the arterial phase of the study. Patency of arterial vessels is not well assessed but there is evidence of portal thrombosis and SMV thrombosis, left portal vein is completely thrombosed though has some flow around the thrombus within the lumen period right posterior division portal branches are thrombosed with partial thrombus of anterior division branches. The central main portal vein and right portal venous branches remain opacified with contrast, not yet completely thrombosed. The SMV thrombus is sparing peripheral venous branches in the mesentery, is more central and extends along approximately 3 cm of the SMV, extending a short ways into the main portal vein. There are developing collaterals in short gastric and gastroepiploic branches. The central liver in areas subtended by thrombosed portal venous branches does not enhance to the same extent as other areas, most pronounced on the arterial phase data set involving mainly the right hepatic lobe but also medial segment of left hepatic lobe. Finding is predominantly central within the liver, best seen on image 43 is of the arterial phase portion of the exam. Heterogeneous enhancement normalizes to a large extent on venous phase with persistent heterogeneity of the posterior right hemi liver in hepatic subsegment VI and VII, adjacent to pancreatic fluid in Morrison's pouch, which is measures approximately 6.1 x 3.0 with striated appearance. This is in a segment of the liver subtended by a thrombosed right posterior portal venous branch best seen on image 40 of the venous phase postcontrast data set. Small thrombi are also likely present in the anterior right portal division seen on image 35 of venous phase imaging. The left portal vein is nearly completely occluded. Pancreas: There is a defect in the neck of the pancreas which communicates with a lobulated fluid collection that tracks into the lesser sac. The duct in the pancreatic  neck at the site of the defect is lost within the pancreatic fluid. In total this area measures 3 x 2.3 cm. Fluid in the hepatic gastric recess of the lesser sac measures 6.2 x 3.6 cm. Fluid tracking along the splenorenal ligament into the left upper quadrant beneath the left hemidiaphragm, dominant collection measures 6.8 x 4.9 cm with another cylindrical tract extending towards a smaller collection in the left subdiaphragmatic space, this measures approximately 3.8 x 3.0 cm with the tract extending along approximately a 3.3 cm path bridging these 2 collections. Fluid in the anterior abdomen within the transverse mesocolon measures 5.0 x 2.8 cm. Loculated collections described above show increased signal on T1 compatible with proteinaceous or hemorrhagic contents particularly areas in the main portion of the lesser sac adjacent to stomach and spleen and in the transverse mesocolon. Diffuse intra-abdominal ascites is present along with extensive edema. Postcontrast the defect in the neck of the pancreas is best seen on image 57 of the arterial phase measuring approximately 17 cm in greatest with. No signs of ductal dilation in the setting of presumed ductal interruption given other findings described above. Spleen: Splenic  vein remains patent in the setting of SMV and portal thrombosis. Spleen is mildly enlarged but otherwise unremarkable. Adrenals/Urinary Tract:  Adrenal glands are normal. Kidneys with smooth contour show normal enhancement. Stomach/Bowel: Extensive edema of stomach and transverse colon in particular. Vascular/Lymphatic: Venous thrombosis with thrombus in the SMV, extending into main portal vein, also in the left and right portal branches greatest on the left. The peripheral splenic vein is patent. Thrombosis of right portal branches Other:  Diffuse pancreatic ascites as described. Musculoskeletal: Normal appearance of visualized bones. IMPRESSION: Necrotic pancreatitis with ductal interruption in  the neck of the pancreas resulting in hemorrhagic or proteinaceous pancreatic collections in the lesser sac and in upper abdominal mesenteric structures and diffuse pancreatic ascites. 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 There is still some patency of main portal vein and right portal branches. Findings of hepatic inflammation versus early hepatic necrosis in the right hepatic lobe with markedly abnormal enhancement of the liver on arterial phase imaging, becoming more normal on later phase with the exception of the area in the posterior right hemi liver. Extensive bowel edema worsened areas directly affected by pancreatic collections, particularly stomach and transverse colon. No signs of biliary ductal dilation with limited assessment of the biliary tree. No filling defects seen in the common bile duct but with numerous gallstones and evidence of sludge in the dependent portion of the gallbladder. Critical Value/emergent results were called by telephone at the time of interpretation on 12/05/2019 at 8:21 am to providerJENNIFER [facility] , who verbally acknowledged these results. Electronically Signed   By: Donzetta Kohut M.D.   On: 12/05/2019 08:25   MR ABDOMEN MRCP W WO CONTAST  Result Date: 12/05/2019 CLINICAL DATA:  Acute abdominal pain, history of pancreatitis. EXAM: MRI ABDOMEN WITHOUT AND WITH CONTRAST (INCLUDING MRCP) TECHNIQUE: Multiplanar multisequence MR imaging of the abdomen was performed both before and after the administration of intravenous contrast. Heavily T2-weighted images of the biliary and pancreatic ducts were obtained, and three-dimensional MRCP images were rendered by post processing. CONTRAST:  7mL GADAVIST GADOBUTROL 1 MMOL/ML IV SOLN COMPARISON:  CT evaluation of 11/20/2019 FINDINGS: Lower chest: Trace pleural fluid on the left. No pericardial effusion. Limited assessment of the lung bases on MRI.  Hepatobiliary: MRCP images markedly limited secondary to diffuse intra-abdominal ascites and extensive edema. Extensive cholelithiasis and sludge in the dependent gallbladder. No large filling defect is seen in the biliary tree in the biliary tree is nondilated. See below for pancreatic findings of note. There is diffuse perihepatic ascites including fluid in the lesser sac about the caudate in the hepato gastric recess. Diffusely abnormal enhancement of the liver on the arterial phase of the study. Patency of arterial vessels is not well assessed but there is evidence of portal thrombosis and SMV thrombosis, left portal vein is completely thrombosed though has some flow around the thrombus within the lumen period right posterior division portal branches are thrombosed with partial thrombus of anterior division branches. The central main portal vein and right portal venous branches remain opacified with contrast, not yet completely thrombosed. The SMV thrombus is sparing peripheral venous branches in the mesentery, is more central and extends along approximately 3 cm of the SMV, extending a short ways into the main portal vein. There are developing collaterals in short gastric and gastroepiploic branches. The central liver in areas subtended by thrombosed portal venous branches does not enhance to the same extent as other  areas, most pronounced on the arterial phase data set involving mainly the right hepatic lobe but also medial segment of left hepatic lobe. Finding is predominantly central within the liver, best seen on image 43 is of the arterial phase portion of the exam. Heterogeneous enhancement normalizes to a large extent on venous phase with persistent heterogeneity of the posterior right hemi liver in hepatic subsegment VI and VII, adjacent to pancreatic fluid in Morrison's pouch, which is measures approximately 6.1 x 3.0 with striated appearance. This is in a segment of the liver subtended by a thrombosed  right posterior portal venous branch best seen on image 40 of the venous phase postcontrast data set. Small thrombi are also likely present in the anterior right portal division seen on image 35 of venous phase imaging. The left portal vein is nearly completely occluded. Pancreas: There is a defect in the neck of the pancreas which communicates with a lobulated fluid collection that tracks into the lesser sac. The duct in the pancreatic neck at the site of the defect is lost within the pancreatic fluid. In total this area measures 3 x 2.3 cm. Fluid in the hepatic gastric recess of the lesser sac measures 6.2 x 3.6 cm. Fluid tracking along the splenorenal ligament into the left upper quadrant beneath the left hemidiaphragm, dominant collection measures 6.8 x 4.9 cm with another cylindrical tract extending towards a smaller collection in the left subdiaphragmatic space, this measures approximately 3.8 x 3.0 cm with the tract extending along approximately a 3.3 cm path bridging these 2 collections. Fluid in the anterior abdomen within the transverse mesocolon measures 5.0 x 2.8 cm. Loculated collections described above show increased signal on T1 compatible with proteinaceous or hemorrhagic contents particularly areas in the main portion of the lesser sac adjacent to stomach and spleen and in the transverse mesocolon. Diffuse intra-abdominal ascites is present along with extensive edema. Postcontrast the defect in the neck of the pancreas is best seen on image 57 of the arterial phase measuring approximately 17 cm in greatest with. No signs of ductal dilation in the setting of presumed ductal interruption given other findings described above. Spleen: Splenic vein remains patent in the setting of SMV and portal thrombosis. Spleen is mildly enlarged but otherwise unremarkable. Adrenals/Urinary Tract:  Adrenal glands are normal. Kidneys with smooth contour show normal enhancement. Stomach/Bowel: Extensive edema of stomach  and transverse colon in particular. Vascular/Lymphatic: Venous thrombosis with thrombus in the SMV, extending into main portal vein, also in the left and right portal branches greatest on the left. The peripheral splenic vein is patent. Thrombosis of right portal branches Other:  Diffuse pancreatic ascites as described. Musculoskeletal: Normal appearance of visualized bones. IMPRESSION: Necrotic pancreatitis with ductal interruption in the neck of the pancreas resulting in hemorrhagic or proteinaceous pancreatic collections in the lesser sac and in upper abdominal mesenteric structures and diffuse pancreatic ascites. 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 There is still some patency of main portal vein and right portal branches. Findings of hepatic inflammation versus early hepatic necrosis in the right hepatic lobe with markedly abnormal enhancement of the liver on arterial phase imaging, becoming more normal on later phase with the exception of the area in the posterior right hemi liver. Extensive bowel edema worsened areas directly affected by pancreatic collections, particularly stomach and transverse colon. No signs of biliary ductal dilation with limited assessment of the biliary tree. No filling defects seen  in the common bile duct but with numerous gallstones and evidence of sludge in the dependent portion of the gallbladder. Critical Value/emergent results were called by telephone at the time of interpretation on 12/05/2019 at 8:21 am to providerJENNIFER [facility] , who verbally acknowledged these results. Electronically Signed   By: Donzetta Kohut M.D.   On: 12/05/2019 08:25        Scheduled Meds: . enoxaparin (LOVENOX) injection  40 mg Subcutaneous Q24H  . folic acid  1 mg Oral Daily  . multivitamin with minerals  1 tablet Oral Daily  . pantoprazole (PROTONIX) IV  40 mg Intravenous Q12H  . thiamine  100 mg Oral Daily    Continuous Infusions: . lactated ringers 250 mL/hr at 12/04/19 1757     LOS: 2 days    Time spent: Spent more than 30 minutes in coordinating care for this patient including bedside patient care.   Liborio Nixon, MD [facility] If 7PM-7AM, please contact night-coverage 12/05/2019, 8:42 AM

## 2019-12-05 NOTE — H&P (View-Only) (Signed)
Progress Note   Subjective  Chief Complaint: Recurrent pancreatitis  Today, the patient explains that he continues with abdominal pain which the morphine is barely touching, this radiates into his back and through his stomach.  Explains that he was able to pass gas a few times and has urinated, though this is "not as much as I thought would be".   Objective   Vital signs in last 24 hours: Temp:  [98 F (36.7 C)-99.5 F (37.5 C)] 99.5 F (37.5 C) (12/17 0505) Pulse Rate:  [76-77] 76 (12/17 0505) Resp:  [12-16] 16 (12/17 0505) BP: (125-131)/(78-83) 125/78 (12/17 0505) SpO2:  [93 %-98 %] 93 % (12/17 0505)   General:    white male in NAD Heart:  Regular rate and rhythm; no murmurs Lungs: Respirations even and unlabored, lungs CTA bilaterally Abdomen:  Soft, Marked epigastric ttp, Moderate generalized ttp and mild distension. Normal bowel sounds. Extremities:  Without edema. Neurologic:  Alert and oriented,  grossly normal neurologically. Psych:  Cooperative. Normal mood and affect.  Intake/Output from previous day: 12/16 0701 - 12/17 0700 In: -  Out: [place] [Urine:1325]  Lab Results: Recent Labs    12/02/19 2010 12/04/19 0528 12/05/19 0935  WBC 15.5* 14.7* 11.3*  HGB 20.3* 17.6* 15.9  HCT 57.9* 53.4* 47.8  PLT 272 221 229   BMET Recent Labs    12/03/19 0503 12/04/19 0528 12/05/19 0935  NA 133* 133* 133*  K 3.7 4.4 3.6  CL 94* 94* 92*  CO2 26 26 28   GLUCOSE 116* 121* 94  BUN 13 10 8   CREATININE 0.52* 0.47* 0.48*  CALCIUM 8.5* 8.6* 8.4*   LFT Recent Labs    12/05/19 0935  PROT 5.9*  ALBUMIN 3.0*  AST 67*  ALT 189*  ALKPHOS 91  BILITOT 2.7*   PT/INR Recent Labs    12/05/19 0935  LABPROT 16.3*  INR 1.3*    Studies/Results: MR 3D Recon At Scanner  Result Date: 12/05/2019 CLINICAL DATA:  Acute abdominal pain, history of pancreatitis. EXAM: MRI ABDOMEN WITHOUT AND WITH CONTRAST (INCLUDING MRCP) TECHNIQUE: Multiplanar multisequence MR imaging of  the abdomen was performed both before and after the administration of intravenous contrast. Heavily T2-weighted images of the biliary and pancreatic ducts were obtained, and three-dimensional MRCP images were rendered by post processing. CONTRAST:  48mL GADAVIST GADOBUTROL 1 MMOL/ML IV SOLN COMPARISON:  CT evaluation of 11/20/2019 FINDINGS: Lower chest: Trace pleural fluid on the left. No pericardial effusion. Limited assessment of the lung bases on MRI. Hepatobiliary: MRCP images markedly limited secondary to diffuse intra-abdominal ascites and extensive edema. Extensive cholelithiasis and sludge in the dependent gallbladder. No large filling defect is seen in the biliary tree in the biliary tree is nondilated. See below for pancreatic findings of note. There is diffuse perihepatic ascites including fluid in the lesser sac about the caudate in the hepato gastric recess. Diffusely abnormal enhancement of the liver on the arterial phase of the study. Patency of arterial vessels is not well assessed but there is evidence of portal thrombosis and SMV thrombosis, left portal vein is completely thrombosed though has some flow around the thrombus within the lumen period right posterior division portal branches are thrombosed with partial thrombus of anterior division branches. The central main portal vein and right portal venous branches remain opacified with contrast, not yet completely thrombosed. The SMV thrombus is sparing peripheral venous branches in the mesentery, is more central and extends along approximately 3 cm of the SMV, extending a  short ways into the main portal vein. There are developing collaterals in short gastric and gastroepiploic branches. The central liver in areas subtended by thrombosed portal venous branches does not enhance to the same extent as other areas, most pronounced on the arterial phase data set involving mainly the right hepatic lobe but also medial segment of left hepatic lobe. Finding  is predominantly central within the liver, best seen on image 43 is of the arterial phase portion of the exam. Heterogeneous enhancement normalizes to a large extent on venous phase with persistent heterogeneity of the posterior right hemi liver in hepatic subsegment VI and VII, adjacent to pancreatic fluid in Morrison's pouch, which is measures approximately 6.1 x 3.0 with striated appearance. This is in a segment of the liver subtended by a thrombosed right posterior portal venous branch best seen on image 40 of the venous phase postcontrast data set. Small thrombi are also likely present in the anterior right portal division seen on image 35 of venous phase imaging. The left portal vein is nearly completely occluded. Pancreas: There is a defect in the neck of the pancreas which communicates with a lobulated fluid collection that tracks into the lesser sac. The duct in the pancreatic neck at the site of the defect is lost within the pancreatic fluid. In total this area measures 3 x 2.3 cm. Fluid in the hepatic gastric recess of the lesser sac measures 6.2 x 3.6 cm. Fluid tracking along the splenorenal ligament into the left upper quadrant beneath the left hemidiaphragm, dominant collection measures 6.8 x 4.9 cm with another cylindrical tract extending towards a smaller collection in the left subdiaphragmatic space, this measures approximately 3.8 x 3.0 cm with the tract extending along approximately a 3.3 cm path bridging these 2 collections. Fluid in the anterior abdomen within the transverse mesocolon measures 5.0 x 2.8 cm. Loculated collections described above show increased signal on T1 compatible with proteinaceous or hemorrhagic contents particularly areas in the main portion of the lesser sac adjacent to stomach and spleen and in the transverse mesocolon. Diffuse intra-abdominal ascites is present along with extensive edema. Postcontrast the defect in the neck of the pancreas is best seen on image 57 of the  arterial phase measuring approximately 17 cm in greatest with. No signs of ductal dilation in the setting of presumed ductal interruption given other findings described above. Spleen: Splenic vein remains patent in the setting of SMV and portal thrombosis. Spleen is mildly enlarged but otherwise unremarkable. Adrenals/Urinary Tract:  Adrenal glands are normal. Kidneys with smooth contour show normal enhancement. Stomach/Bowel: Extensive edema of stomach and transverse colon in particular. Vascular/Lymphatic: Venous thrombosis with thrombus in the SMV, extending into main portal vein, also in the left and right portal branches greatest on the left. The peripheral splenic vein is patent. Thrombosis of right portal branches Other:  Diffuse pancreatic ascites as described. Musculoskeletal: Normal appearance of visualized bones. IMPRESSION: Necrotic pancreatitis with ductal interruption in the neck of the pancreas resulting in hemorrhagic or proteinaceous pancreatic collections in the lesser sac and in upper abdominal mesenteric structures and diffuse pancreatic ascites. 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 There is still some patency of main portal vein and right portal branches. Findings of hepatic inflammation versus early hepatic necrosis in the right hepatic lobe with markedly abnormal enhancement of the liver on arterial phase imaging, becoming more normal on later phase with the exception of the area in  the posterior right hemi liver. Extensive bowel edema worsened areas directly affected by pancreatic collections, particularly stomach and transverse colon. No signs of biliary ductal dilation with limited assessment of the biliary tree. No filling defects seen in the common bile duct but with numerous gallstones and evidence of sludge in the dependent portion of the gallbladder. Critical Value/emergent results were called by telephone  at the time of interpretation on 12/05/2019 at 8:21 am to provider[facility]ENNIFER [facility] , who verbally acknowledged these results. Electronically Signed   By: Donzetta KohutGeoffrey  Wile M.D.   On: 12/05/2019 08:25   MR ABDOMEN MRCP W WO CONTAST  Result Date: 12/05/2019 CLINICAL DATA:  Acute abdominal pain, history of pancreatitis. EXAM: MRI ABDOMEN WITHOUT AND WITH CONTRAST (INCLUDING MRCP) TECHNIQUE: Multiplanar multisequence MR imaging of the abdomen was performed both before and after the administration of intravenous contrast. Heavily T2-weighted images of the biliary and pancreatic ducts were obtained, and three-dimensional MRCP images were rendered by post processing. CONTRAST:  7mL GADAVIST GADOBUTROL 1 MMOL/ML IV SOLN COMPARISON:  CT evaluation of 11/20/2019 FINDINGS: Lower chest: Trace pleural fluid on the left. No pericardial effusion. Limited assessment of the lung bases on MRI. Hepatobiliary: MRCP images markedly limited secondary to diffuse intra-abdominal ascites and extensive edema. Extensive cholelithiasis and sludge in the dependent gallbladder. No large filling defect is seen in the biliary tree in the biliary tree is nondilated. See below for pancreatic findings of note. There is diffuse perihepatic ascites including fluid in the lesser sac about the caudate in the hepato gastric recess. Diffusely abnormal enhancement of the liver on the arterial phase of the study. Patency of arterial vessels is not well assessed but there is evidence of portal thrombosis and SMV thrombosis, left portal vein is completely thrombosed though has some flow around the thrombus within the lumen period right posterior division portal branches are thrombosed with partial thrombus of anterior division branches. The central main portal vein and right portal venous branches remain opacified with contrast, not yet completely thrombosed. The SMV thrombus is sparing peripheral venous branches in the mesentery, is more central and extends  along approximately 3 cm of the SMV, extending a short ways into the main portal vein. There are developing collaterals in short gastric and gastroepiploic branches. The central liver in areas subtended by thrombosed portal venous branches does not enhance to the same extent as other areas, most pronounced on the arterial phase data set involving mainly the right hepatic lobe but also medial segment of left hepatic lobe. Finding is predominantly central within the liver, best seen on image 43 is of the arterial phase portion of the exam. Heterogeneous enhancement normalizes to a large extent on venous phase with persistent heterogeneity of the posterior right hemi liver in hepatic subsegment VI and VII, adjacent to pancreatic fluid in Morrison's pouch, which is measures approximately 6.1 x 3.0 with striated appearance. This is in a segment of the liver subtended by a thrombosed right posterior portal venous branch best seen on image 40 of the venous phase postcontrast data set. Small thrombi are also likely present in the anterior right portal division seen on image 35 of venous phase imaging. The left portal vein is nearly completely occluded. Pancreas: There is a defect in the neck of the pancreas which communicates with a lobulated fluid collection that tracks into the lesser sac. The duct in the pancreatic neck at the site of the defect is lost within the pancreatic fluid. In total this area measures 3 x  2.3 cm. Fluid in the hepatic gastric recess of the lesser sac measures 6.2 x 3.6 cm. Fluid tracking along the splenorenal ligament into the left upper quadrant beneath the left hemidiaphragm, dominant collection measures 6.8 x 4.9 cm with another cylindrical tract extending towards a smaller collection in the left subdiaphragmatic space, this measures approximately 3.8 x 3.0 cm with the tract extending along approximately a 3.3 cm path bridging these 2 collections. Fluid in the anterior abdomen within the  transverse mesocolon measures 5.0 x 2.8 cm. Loculated collections described above show increased signal on T1 compatible with proteinaceous or hemorrhagic contents particularly areas in the main portion of the lesser sac adjacent to stomach and spleen and in the transverse mesocolon. Diffuse intra-abdominal ascites is present along with extensive edema. Postcontrast the defect in the neck of the pancreas is best seen on image 57 of the arterial phase measuring approximately 17 cm in greatest with. No signs of ductal dilation in the setting of presumed ductal interruption given other findings described above. Spleen: Splenic vein remains patent in the setting of SMV and portal thrombosis. Spleen is mildly enlarged but otherwise unremarkable. Adrenals/Urinary Tract:  Adrenal glands are normal. Kidneys with smooth contour show normal enhancement. Stomach/Bowel: Extensive edema of stomach and transverse colon in particular. Vascular/Lymphatic: Venous thrombosis with thrombus in the SMV, extending into main portal vein, also in the left and right portal branches greatest on the left. The peripheral splenic vein is patent. Thrombosis of right portal branches Other:  Diffuse pancreatic ascites as described. Musculoskeletal: Normal appearance of visualized bones. IMPRESSION: Necrotic pancreatitis with ductal interruption in the neck of the pancreas resulting in hemorrhagic or proteinaceous pancreatic collections in the lesser sac and in upper abdominal mesenteric structures and diffuse pancreatic ascites. 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 There is still some patency of main portal vein and right portal branches. Findings of hepatic inflammation versus early hepatic necrosis in the right hepatic lobe with markedly abnormal enhancement of the liver on arterial phase imaging, becoming more normal on later phase with the exception of the area in  the posterior right hemi liver. Extensive bowel edema worsened areas directly affected by pancreatic collections, particularly stomach and transverse colon. No signs of biliary ductal dilation with limited assessment of the biliary tree. No filling defects seen in the common bile duct but with numerous gallstones and evidence of sludge in the dependent portion of the gallbladder. Critical Value/emergent results were called by telephone at the time of interpretation on 12/05/2019 at 8:21 am to providerJENNIFER Deneise Getty , who verbally acknowledged these results. Electronically Signed   By: Geoffrey  Wile M.D.   On: 12/05/2019 08:25     Assessment / Plan:   Assessment: 1.  Acute recurrent pancreatitis: Due to alcohol, with developing necrosis and portal vein as well as SMV thrombosis as well as ductal interruption in the neck of the pancreas, see MRI above 2.  Elevated LFTs: Likely due to above 3.  Hyperbilirubinemia 4.  Portal vein thrombosis and SMV thrombosis 5.  Alcohol use disorder  Plan: 1.  Scheduled patient for an ERCP 12/06/2019 at 11 am with Dr. Jacobs.  Did discuss risks, benefits, limitations and alternatives and patient agrees to proceed.  This will be in an attempt to place a pancreatic duct stent. 2.  Placed pharmacy consult to start a heparin drip today after discussion with hematology.  This will also need to be stopped   at 7:00 tomorrow morning as we prepare for procedure above.  They recommend patient leaving the hospital on Eliquis or Xarelto, which ever is least expensive for them. 3.  Ordered a nicotine patch and a shower per patient request 4.  Started the patient on Meropenem per pharmacy consult 5.  Discussed case with hospitalist team.  They will be in charge of his pain management. 6.  Please await any further recommendations from Dr. Barron Alvine later today.  Thank you for kind consultation, we will continue to follow.   LOS: 2 days   Unk Lightning  12/05/2019,  10:46 AM

## 2019-12-05 NOTE — Progress Notes (Signed)
ANTICOAGULATION CONSULT NOTE - Initial Consult  Pharmacy Consult for IV heparin Indication: portal vein thrombosis  Allergies  Allergen Reactions  . Penicillins Rash    Has patient had a PCN reaction causing immediate rash, facial/tongue/throat swelling, SOB or lightheadedness with hypotension: yes Has patient had a PCN reaction causing severe rash involving mucus membranes or skin necrosis: Yes Has patient had a PCN reaction that required hospitalization: No Has patient had a PCN reaction occurring within the last 10 years: No If all of the above answers are "NO", then may proceed with Cephalosporin use.     Patient Measurements: Height: 5\' 9"  (175.3 cm) Weight: 164 lb (74.4 kg) IBW/kg (Calculated) : 70.7 Heparin Dosing Weight:   Vital Signs: Temp: 99.5 F (37.5 C) (12/17 0505) Temp Source: Oral (12/17 0505) BP: 125/78 (12/17 0505) Pulse Rate: 76 (12/17 0505)  Labs: Recent Labs    12/02/19 2010 12/03/19 0503 12/04/19 0528 12/05/19 0935  HGB 20.3*  --  17.6* 15.9  HCT 57.9*  --  53.4* 47.8  PLT 272  --  221 229  LABPROT  --   --   --  16.3*  INR  --   --   --  1.3*  CREATININE 0.56* 0.52* 0.47* 0.48*    Estimated Creatinine Clearance: 124 mL/min (A) (by C-G formula based on SCr of 0.48 mg/dL (L)).   Medical History: Past Medical History:  Diagnosis Date  . Abdominal pain   . Nausea & vomiting   . Pancreatitis     Medications:  Scheduled:  . folic acid  1 mg Oral Daily  . multivitamin with minerals  1 tablet Oral Daily  . nicotine  7 mg Transdermal Daily  . octreotide  100 mcg Subcutaneous Q12H  . pantoprazole (PROTONIX) IV  40 mg Intravenous Q12H  . thiamine  100 mg Oral Daily    Assessment: Pharmacy is consulted to dose heparin in 39 yo male diagnosed with portal vein thrombosis. CT done on 12/17 shows "venous thrombosis which is extensive in the SMV, main portal, left portal and right portal. There is still some patency of main portal vein and  right portal branches."   Prior to start of IV heparin, pt received a prophylactic dose of enoxaparin 40 mg this AM at 0853.   Today, 12/05/19  Scr 0.48, CrCl > 100 ml/min  CBC WNL  INR 1.3, PT 16.3     Goal of Therapy:  Heparin level 0.3-0.7 units/ml Monitor platelets by anticoagulation protocol: Yes   Plan:   As pt received a dose of enoxaparin recently, will not give initial bolus  Start heparin at 1300 units/hr   Obtain HL 6 hours after start of drip   Daily CBC while on heparin  Heparin to stop at 0700 on 12/18 for anticipated ERCP scheduled at 1100 on 12/18   Royetta Asal, PharmD, BCPS 12/05/2019 11:55 AM

## 2019-12-06 ENCOUNTER — Inpatient Hospital Stay (HOSPITAL_COMMUNITY): Payer: Self-pay | Admitting: Anesthesiology

## 2019-12-06 ENCOUNTER — Encounter (HOSPITAL_COMMUNITY)
Admission: EM | Disposition: A | Discharge status: Home / Self Care | Payer: Self-pay | Source: Home / Self Care | Attending: Internal Medicine

## 2019-12-06 ENCOUNTER — Inpatient Hospital Stay (HOSPITAL_COMMUNITY): Payer: Self-pay

## 2019-12-06 ENCOUNTER — Encounter (HOSPITAL_COMMUNITY): Payer: Self-pay | Admitting: Internal Medicine

## 2019-12-06 HISTORY — PX: ERCP: SHX5425

## 2019-12-06 LAB — COMPREHENSIVE METABOLIC PANEL
ALT: 105 U/L — ABNORMAL HIGH (ref 0–44)
AST: 30 U/L (ref 15–41)
Albumin: 2.5 g/dL — ABNORMAL LOW (ref 3.5–5.0)
Alkaline Phosphatase: 72 U/L (ref 38–126)
Anion gap: 12 (ref 5–15)
BUN: 6 mg/dL (ref 6–20)
CO2: 27 mmol/L (ref 22–32)
Calcium: 8.1 mg/dL — ABNORMAL LOW (ref 8.9–10.3)
Chloride: 95 mmol/L — ABNORMAL LOW (ref 98–111)
Creatinine, Ser: 0.38 mg/dL — ABNORMAL LOW (ref 0.61–1.24)
GFR calc Af Amer: >60 mL/min (ref >60)
GFR calc non Af Amer: >60 mL/min (ref >60)
Glucose, Bld: 70 mg/dL (ref 70–99)
Potassium: 3.2 mmol/L — ABNORMAL LOW (ref 3.5–5.1)
Sodium: 134 mmol/L — ABNORMAL LOW (ref 135–145)
Total Bilirubin: 1.7 mg/dL — ABNORMAL HIGH (ref 0.3–1.2)
Total Protein: 5.2 g/dL — ABNORMAL LOW (ref 6.5–8.1)

## 2019-12-06 LAB — CBC
HCT: 43.1 % (ref 39.0–52.0)
Hemoglobin: 14.5 g/dL (ref 13.0–17.0)
MCH: 32.3 pg (ref 26.0–34.0)
MCHC: 33.6 g/dL (ref 30.0–36.0)
MCV: 96 fL (ref 80.0–100.0)
Platelets: 200 10*3/uL (ref 150–400)
RBC: 4.49 MIL/uL (ref 4.22–5.81)
RDW: 13.7 % (ref 11.5–15.5)
WBC: 7.4 10*3/uL (ref 4.0–10.5)
nRBC: 0 % (ref 0.0–0.2)

## 2019-12-06 LAB — HEPARIN LEVEL (UNFRACTIONATED): Heparin Unfractionated: <0.1 IU/mL — ABNORMAL LOW (ref 0.30–0.70)

## 2019-12-06 LAB — MAGNESIUM: Magnesium: 1.6 mg/dL — ABNORMAL LOW (ref 1.7–2.4)

## 2019-12-06 SURGERY — ERCP, WITH INTERVENTION IF INDICATED
Anesthesia: General

## 2019-12-06 MED ORDER — ROCURONIUM BROMIDE 10 MG/ML (PF) SYRINGE
PREFILLED_SYRINGE | INTRAVENOUS | Status: DC | PRN
  Administered 2019-12-06: 50 mg via INTRAVENOUS

## 2019-12-06 MED ORDER — LIDOCAINE 2% (20 MG/ML) 5 ML SYRINGE
INTRAMUSCULAR | Status: DC | PRN
  Administered 2019-12-06: 100 mg via INTRAVENOUS

## 2019-12-06 MED ORDER — MAGNESIUM SULFATE 2 GM/50ML IV SOLN
2.0000 g | Freq: Once | INTRAVENOUS | Status: AC
  Administered 2019-12-06: 2 g via INTRAVENOUS
  Filled 2019-12-06: qty 50

## 2019-12-06 MED ORDER — HEPARIN BOLUS VIA INFUSION
1700.0000 [IU] | Freq: Once | INTRAVENOUS | Status: AC
  Administered 2019-12-06: 1700 [IU] via INTRAVENOUS
  Filled 2019-12-06: qty 1700

## 2019-12-06 MED ORDER — PROPOFOL 10 MG/ML IV BOLUS
INTRAVENOUS | Status: DC | PRN
  Administered 2019-12-06: 150 mg via INTRAVENOUS

## 2019-12-06 MED ORDER — ONDANSETRON HCL 4 MG/2ML IJ SOLN
4.0000 mg | Freq: Three times a day (TID) | INTRAMUSCULAR | Status: DC
  Administered 2019-12-06 – 2019-12-11 (×15): 4 mg via INTRAVENOUS
  Filled 2019-12-06 (×15): qty 2

## 2019-12-06 MED ORDER — SUGAMMADEX SODIUM 200 MG/2ML IV SOLN
INTRAVENOUS | Status: DC | PRN
  Administered 2019-12-06: 200 mg via INTRAVENOUS

## 2019-12-06 MED ORDER — FENTANYL CITRATE (PF) 250 MCG/5ML IJ SOLN
INTRAMUSCULAR | Status: DC | PRN
  Administered 2019-12-06 (×2): 100 ug via INTRAVENOUS

## 2019-12-06 MED ORDER — MIDAZOLAM HCL 2 MG/2ML IJ SOLN
INTRAMUSCULAR | Status: AC
  Filled 2019-12-06: qty 2

## 2019-12-06 MED ORDER — FENTANYL CITRATE (PF) 100 MCG/2ML IJ SOLN
INTRAMUSCULAR | Status: AC
  Filled 2019-12-06: qty 2

## 2019-12-06 MED ORDER — SODIUM CHLORIDE 0.9 % IV SOLN: INTRAVENOUS | Status: DC

## 2019-12-06 MED ORDER — HEPARIN (PORCINE) 25000 UT/250ML-% IV SOLN
1850.0000 [IU]/h | INTRAVENOUS | Status: DC
  Administered 2019-12-06: 1450 [IU]/h via INTRAVENOUS
  Administered 2019-12-07: 1700 [IU]/h via INTRAVENOUS
  Administered 2019-12-07 – 2019-12-09 (×4): 1850 [IU]/h via INTRAVENOUS
  Filled 2019-12-06 (×8): qty 250

## 2019-12-06 MED ORDER — INDOMETHACIN 50 MG RE SUPP: 100.0000 mg | Freq: Once | RECTAL | Status: DC

## 2019-12-06 MED ORDER — MIDAZOLAM HCL 5 MG/5ML IJ SOLN
INTRAMUSCULAR | Status: DC | PRN
  Administered 2019-12-06: 2 mg via INTRAVENOUS

## 2019-12-06 MED ORDER — POTASSIUM CHLORIDE 10 MEQ/100ML IV SOLN
10.0000 meq | INTRAVENOUS | Status: AC
  Administered 2019-12-06 (×4): 10 meq via INTRAVENOUS
  Filled 2019-12-06 (×2): qty 100

## 2019-12-06 MED ORDER — PROPOFOL 10 MG/ML IV BOLUS
INTRAVENOUS | Status: AC
  Filled 2019-12-06: qty 20

## 2019-12-06 MED ORDER — INDOMETHACIN 50 MG RE SUPP
RECTAL | Status: AC
  Filled 2019-12-06: qty 2

## 2019-12-06 MED ORDER — GLUCAGON HCL RDNA (DIAGNOSTIC) 1 MG IJ SOLR
INTRAMUSCULAR | Status: DC | PRN
  Administered 2019-12-06: .5 mg via INTRAVENOUS

## 2019-12-06 MED ORDER — GLUCAGON HCL RDNA (DIAGNOSTIC) 1 MG IJ SOLR
INTRAMUSCULAR | Status: AC
  Filled 2019-12-06: qty 1

## 2019-12-06 NOTE — Consult Note (Signed)
Paul Allen 09/09/1981  [id number].    Requesting MD: Dr. Rob Buntinganiel Jacobs Chief Complaint/Reason for Consult: pancreatic duct disruption in the setting of pancreatitis  HPI:  This is a 38 year old white male with a history of pancreatitis in 2019 and earlier this year in July 2020.  These are felt likely to be related to alcohol abuse.  For over the past month the patient has felt like something has been "off."  He states he has lost about 15 to 20 pounds within the last month due to some nausea and vomiting but also poor appetite.  He has felt bloated.  He normally does not have constipation but did have some constipation over the last couple of weeks.  He states he has been having some right-sided abdominal pain and some pain in his central abdomen but not the same typical sharp pain initially that he had with his other 2 episodes of pancreatitis.  He presented to the emergency department in early December because of these complaints but was found to have a lipase of only around 200 per the patient and was discharged home with follow up with GI.  He did see them in the office and was scheduled for an US, but was unable to make it secondary to worsening of his symptoms.  [facility] he was admitted and an US was complete which did not reveal cholecystitis, but concern for portal vein occlusion.  Lipase was 435 at the time.  An MRCP was then obtained and please see imaging section for that extensive findings report.  He was admitted for these findings of pancreatitis with necrosis, extensive portal vein thrombosis with extension, as well as pancreatic ductal disruption in the neck of the pancreas, along with hepatic necrosis of the right lobe.  An attempt was made by GI today for pancreatic stent placement but due to edema was unable to cannulate the major papilla.  We have been asked to see the patient incase endoscopic intervention fails.  ROS: ROS: Please see HPI,  otherwise all other systems reviewed and are negative.  Family History  Problem Relation Age of Onset  . Cholecystitis Mother   . Arthritis Mother   . Stomach cancer Father     Past Medical History:  Diagnosis Date  . Abdominal pain   . Nausea & vomiting   . Pancreatitis     Past Surgical History:  Procedure Laterality Date  . APPENDECTOMY      Social History:  reports that he has been smoking cigarettes. He has been smoking about 1.50 packs per day. He has never used smokeless tobacco. He reports previous alcohol use. He reports current drug use. Frequency: 7.00 times per week. Drug: Marijuana.  Allergies:  Allergies  Allergen Reactions  . Penicillins Rash    Has patient had a PCN reaction causing immediate rash, facial/tongue/throat swelling, SOB or lightheadedness with hypotension: yes Has patient had a PCN reaction causing severe rash involving mucus membranes or skin necrosis: Yes Has patient had a PCN reaction that required hospitalization: No Has patient had a PCN reaction occurring within the last 10 years: No If all of the above answers are "NO", then may proceed with Cephalosporin use.     Medications Prior to Admission  Medication Sig Dispense Refill  . famotidine (PEPCID) 20 MG tablet Take 1 tablet (20 mg total) by mouth 2 (two) times daily. 30 tablet 0  . Multiple Vitamin (MULTIVITAMIN WITH MINERALS) TABS  tablet Take 1 tablet by mouth daily. 30 tablet 0  . oxyCODONE-acetaminophen (PERCOCET) 5-325 MG tablet Take 2 tablets by mouth every 4 (four) hours as needed. (Patient taking differently: Take 2 tablets by mouth every 4 (four) hours as needed for moderate pain or severe pain. ) 20 tablet 0  . pantoprazole (PROTONIX) 20 MG tablet Take 1 tablet (20 mg total) by mouth daily. 15 tablet 0  . traMADol (ULTRAM) 50 MG tablet Take 1 tablet (50 mg total) by mouth every 8 (eight) hours as needed. (Patient taking differently: Take 50 mg by mouth every 8 (eight) hours as  needed for moderate pain or severe pain. ) 30 tablet 0     Physical Exam: Blood pressure 120/81, pulse 73, temperature 98.3 F (36.8 C), temperature source Oral, resp. rate 14, height 5\' 9"  (1.753 m), weight 74.4 kg, SpO2 92 %. General: pleasant, WD, WN white male who is sitting up in his chair and clearly does not feel well. HEENT: head is normocephalic, atraumatic.  Sclera are noninjected.  PERRL.  Ears and nose without any masses or lesions.  Mouth is pink and moist Heart: regular, rate, and rhythm.  Normal s1,s2. No obvious murmurs, gallops, or rubs noted.  Palpable radial and pedal pulses bilaterally Lungs: CTAB, no wheezes, rhonchi, or rales noted.  Respiratory effort nonlabored Abd: distended and relatively tight, but not really tender right now secondary to pain medications, +BS, no masses, hernias, or organomegaly MS: all 4 extremities are symmetrical with no cyanosis, clubbing, or edema. Skin: warm and dry with no masses, lesions, or rashes Psych: A&Ox3 with an appropriate affect.   Results for orders placed or performed during the hospital encounter of 12/02/19 (from the past 48 hour(s))  CBC Once     Status: Abnormal   Collection Time: 12/05/19  9:35 AM  Result Value Ref Range   WBC 11.3 (H) 4.0 - 10.5 K/uL   RBC 4.87 4.22 - 5.81 MIL/uL   Hemoglobin 15.9 13.0 - 17.0 g/dL   HCT 47.8 39.0 - 52.0 %   MCV 98.2 80.0 - 100.0 fL   MCH 32.6 26.0 - 34.0 pg   MCHC 33.3 30.0 - 36.0 g/dL   RDW 13.9 11.5 - 15.5 %   Platelets 229 150 - 400 K/uL   nRBC 0.0 0.0 - 0.2 %    Comment: Performed at [facility], [place] [address], [place], [place] [address]  Comprehensive metabolic panel Once     Status: Abnormal   Collection Time: 12/05/19  9:35 AM  Result Value Ref Range   Sodium 133 (L) 135 - 145 mmol/L   Potassium 3.6 3.5 - 5.1 mmol/L   Chloride 92 (L) 98 - 111 mmol/L   CO2 28 22 - 32 mmol/L   Glucose, Bld 94 70 - 99 mg/dL   BUN 8 6 - 20 mg/dL   Creatinine, Ser  0.48 (L) 0.61 - 1.24 mg/dL   Calcium 8.4 (L) 8.9 - 10.3 mg/dL   Total Protein 5.9 (L) 6.5 - 8.1 g/dL   Albumin 3.0 (L) 3.5 - 5.0 g/dL   AST 67 (H) 15 - 41 U/L   ALT 189 (H) 0 - 44 U/L   Alkaline Phosphatase 91 38 - 126 U/L   Total Bilirubin 2.7 (H) 0.3 - 1.2 mg/dL   GFR calc non Af Amer >60 >60 mL/min   GFR calc Af Amer >60 >60 mL/min   Anion gap 13 5 - 15    Comment: Performed  at [facility], [address] [place], [place] [address]  Protime-INR Once     Status: Abnormal   Collection Time: 12/05/19  9:35 AM  Result Value Ref Range   Prothrombin Time 16.3 (H) 11.4 - 15.2 seconds   INR 1.3 (H) 0.8 - 1.2    Comment: (NOTE) INR goal varies based on device and disease states. Performed at [facility], [address], [place], [place] [address]   Heparin level (unfractionated)     Status: Abnormal   Collection Time: 12/05/19  8:44 PM  Result Value Ref Range   Heparin Unfractionated 0.20 (L) 0.30 - 0.70 IU/mL    Comment: (NOTE) If heparin results are below expected values, and patient dosage has  been confirmed, suggest follow up testing of antithrombin III levels. Performed at [facility], [address] [place], [place] [address]   CBC     Status: None   Collection Time: 12/06/19  4:51 AM  Result Value Ref Range   WBC 7.4 4.0 - 10.5 K/uL   RBC 4.49 4.22 - 5.81 MIL/uL   Hemoglobin 14.5 13.0 - 17.0 g/dL   HCT 21.3 08.6 - 57.8 %   MCV 96.0 80.0 - 100.0 fL   MCH 32.3 26.0 - 34.0 pg   MCHC 33.6 30.0 - 36.0 g/dL   RDW 46.9 62.9 - 52.8 %   Platelets 200 150 - 400 K/uL   nRBC 0.0 0.0 - 0.2 %    Comment: Performed at [facility], [address] [place], [place] [address]  Comprehensive metabolic panel     Status: Abnormal   Collection Time: 12/06/19 12:38 PM  Result Value Ref Range   Sodium 134 (L) 135 - 145 mmol/L   Potassium 3.2 (L) 3.5 - 5.1 mmol/L   Chloride 95 (L) 98 - 111 mmol/L   CO2 27  22 - 32 mmol/L   Glucose, Bld 70 70 - 99 mg/dL   BUN 6 6 - 20 mg/dL   Creatinine, Ser 4.01 (L) 0.61 - 1.24 mg/dL   Calcium 8.1 (L) 8.9 - 10.3 mg/dL   Total Protein 5.2 (L) 6.5 - 8.1 g/dL   Albumin 2.5 (L) 3.5 - 5.0 g/dL   AST 30 15 - 41 U/L   ALT 105 (H) 0 - 44 U/L   Alkaline Phosphatase 72 38 - 126 U/L   Total Bilirubin 1.7 (H) 0.3 - 1.2 mg/dL   GFR calc non Af Amer >60 >60 mL/min   GFR calc Af Amer >60 >60 mL/min   Anion gap 12 5 - 15    Comment: Performed at [facility], [address] [place], [place] [address]   MR 3D Recon At Scanner  Result Date: 12/05/2019 CLINICAL DATA:  Acute abdominal pain, history of pancreatitis. EXAM: MRI ABDOMEN WITHOUT AND WITH CONTRAST (INCLUDING MRCP) TECHNIQUE: Multiplanar multisequence MR imaging of the abdomen was performed both before and after the administration of intravenous contrast. Heavily T2-weighted images of the biliary and pancreatic ducts were obtained, and three-dimensional MRCP images were rendered by post processing. CONTRAST:  7mL GADAVIST GADOBUTROL 1 MMOL/ML IV SOLN COMPARISON:  CT evaluation of 11/20/2019 FINDINGS: Lower chest: Trace pleural fluid on the left. No pericardial effusion. Limited assessment of the lung bases on MRI. Hepatobiliary: MRCP images markedly limited secondary to diffuse intra-abdominal ascites and extensive edema. Extensive cholelithiasis and sludge in the dependent gallbladder. No large filling defect is seen in the biliary tree in  the biliary tree is nondilated. See below for pancreatic findings of note. There is diffuse perihepatic ascites including fluid in the lesser sac about the caudate in the hepato gastric recess. Diffusely abnormal enhancement of the liver on the arterial phase of the study. Patency of arterial vessels is not well assessed but there is evidence of portal thrombosis and SMV thrombosis, left portal vein is completely thrombosed though has some flow around the thrombus  within the lumen period right posterior division portal branches are thrombosed with partial thrombus of anterior division branches. The central main portal vein and right portal venous branches remain opacified with contrast, not yet completely thrombosed. The SMV thrombus is sparing peripheral venous branches in the mesentery, is more central and extends along approximately 3 cm of the SMV, extending a short ways into the main portal vein. There are developing collaterals in short gastric and gastroepiploic branches. The central liver in areas subtended by thrombosed portal venous branches does not enhance to the same extent as other areas, most pronounced on the arterial phase data set involving mainly the right hepatic lobe but also medial segment of left hepatic lobe. Finding is predominantly central within the liver, best seen on image 43 is of the arterial phase portion of the exam. Heterogeneous enhancement normalizes to a large extent on venous phase with persistent heterogeneity of the posterior right hemi liver in hepatic subsegment VI and VII, adjacent to pancreatic fluid in Morrison's pouch, which is measures approximately 6.1 x 3.0 with striated appearance. This is in a segment of the liver subtended by a thrombosed right posterior portal venous branch best seen on image 40 of the venous phase postcontrast data set. Small thrombi are also likely present in the anterior right portal division seen on image 35 of venous phase imaging. The left portal vein is nearly completely occluded. Pancreas: There is a defect in the neck of the pancreas which communicates with a lobulated fluid collection that tracks into the lesser sac. The duct in the pancreatic neck at the site of the defect is lost within the pancreatic fluid. In total this area measures 3 x 2.3 cm. Fluid in the hepatic gastric recess of the lesser sac measures 6.2 x 3.6 cm. Fluid tracking along the splenorenal ligament into the left upper  quadrant beneath the left hemidiaphragm, dominant collection measures 6.8 x 4.9 cm with another cylindrical tract extending towards a smaller collection in the left subdiaphragmatic space, this measures approximately 3.8 x 3.0 cm with the tract extending along approximately a 3.3 cm path bridging these 2 collections. Fluid in the anterior abdomen within the transverse mesocolon measures 5.0 x 2.8 cm. Loculated collections described above show increased signal on T1 compatible with proteinaceous or hemorrhagic contents particularly areas in the main portion of the lesser sac adjacent to stomach and spleen and in the transverse mesocolon. Diffuse intra-abdominal ascites is present along with extensive edema. Postcontrast the defect in the neck of the pancreas is best seen on image 57 of the arterial phase measuring approximately 17 cm in greatest with. No signs of ductal dilation in the setting of presumed ductal interruption given other findings described above. Spleen: Splenic vein remains patent in the setting of SMV and portal thrombosis. Spleen is mildly enlarged but otherwise unremarkable. Adrenals/Urinary Tract:  Adrenal glands are normal. Kidneys with smooth contour show normal enhancement. Stomach/Bowel: Extensive edema of stomach and transverse colon in particular. Vascular/Lymphatic: Venous thrombosis with thrombus in the SMV, extending into main portal vein, also  in the left and right portal branches greatest on the left. The peripheral splenic vein is patent. Thrombosis of right portal branches Other:  Diffuse pancreatic ascites as described. Musculoskeletal: Normal appearance of visualized bones. IMPRESSION: Necrotic pancreatitis with ductal interruption in the neck of the pancreas resulting in hemorrhagic or proteinaceous pancreatic collections in the lesser sac and in upper abdominal mesenteric structures and diffuse pancreatic ascites. Findings complicated by venous thrombosis which is extensive in  the SMV, main portal, left portal and right portal. There is still some patency of main portal vein and right portal branches. Findings of hepatic inflammation versus early hepatic necrosis in the right hepatic lobe with markedly abnormal enhancement of the liver on arterial phase imaging, becoming more normal on later phase with the exception of the area in the posterior right hemi liver. Extensive bowel edema worsened areas directly affected by pancreatic collections, particularly stomach and transverse colon. No signs of biliary ductal dilation with limited assessment of the biliary tree. No filling defects seen in the common bile duct but with numerous gallstones and evidence of sludge in the dependent portion of the gallbladder. Critical Value/emergent results were called by telephone at the time of interpretation on 12/05/2019 at 8:21 am to providerJENNIFER [facility] , who verbally acknowledged these results. Electronically Signed   By: Donzetta Kohut M.D.   On: 12/05/2019 08:25   DG ERCP BILIARY & PANCREATIC DUCTS  Result Date: 12/06/2019 CLINICAL DATA:  38 year old male with a history of pancreatitis EXAM: ERCP TECHNIQUE: Multiple spot images obtained with the fluoroscopic device and submitted for interpretation post-procedure. FLUOROSCOPY TIME:  Fluoroscopy Time:  0 minutes 7 seconds COMPARISON:  MR 12/04/2019 FINDINGS: Limited intraoperative fluoroscopic spot images. Single image demonstrates the endoscope projecting over the upper abdomen. IMPRESSION: Limited images of ERCP with single image demonstrating the endoscope over the upper abdomen. Please refer to the dictated operative report for full details of intraoperative findings and procedure. Electronically Signed   By: Gilmer Mor D.O.   On: 12/06/2019 12:29   MR ABDOMEN MRCP W WO CONTAST  Result Date: 12/05/2019 CLINICAL DATA:  Acute abdominal pain, history of pancreatitis. EXAM: MRI ABDOMEN WITHOUT AND WITH CONTRAST (INCLUDING MRCP)  TECHNIQUE: Multiplanar multisequence MR imaging of the abdomen was performed both before and after the administration of intravenous contrast. Heavily T2-weighted images of the biliary and pancreatic ducts were obtained, and three-dimensional MRCP images were rendered by post processing. CONTRAST:  76mL GADAVIST GADOBUTROL 1 MMOL/ML IV SOLN COMPARISON:  CT evaluation of 11/20/2019 FINDINGS: Lower chest: Trace pleural fluid on the left. No pericardial effusion. Limited assessment of the lung bases on MRI. Hepatobiliary: MRCP images markedly limited secondary to diffuse intra-abdominal ascites and extensive edema. Extensive cholelithiasis and sludge in the dependent gallbladder. No large filling defect is seen in the biliary tree in the biliary tree is nondilated. See below for pancreatic findings of note. There is diffuse perihepatic ascites including fluid in the lesser sac about the caudate in the hepato gastric recess. Diffusely abnormal enhancement of the liver on the arterial phase of the study. Patency of arterial vessels is not well assessed but there is evidence of portal thrombosis and SMV thrombosis, left portal vein is completely thrombosed though has some flow around the thrombus within the lumen period right posterior division portal branches are thrombosed with partial thrombus of anterior division branches. The central main portal vein and right portal venous branches remain opacified with contrast, not yet completely thrombosed. The SMV thrombus is sparing peripheral  venous branches in the mesentery, is more central and extends along approximately 3 cm of the SMV, extending a short ways into the main portal vein. There are developing collaterals in short gastric and gastroepiploic branches. The central liver in areas subtended by thrombosed portal venous branches does not enhance to the same extent as other areas, most pronounced on the arterial phase data set involving mainly the right hepatic lobe  but also medial segment of left hepatic lobe. Finding is predominantly central within the liver, best seen on image 43 is of the arterial phase portion of the exam. Heterogeneous enhancement normalizes to a large extent on venous phase with persistent heterogeneity of the posterior right hemi liver in hepatic subsegment VI and VII, adjacent to pancreatic fluid in Morrison's pouch, which is measures approximately 6.1 x 3.0 with striated appearance. This is in a segment of the liver subtended by a thrombosed right posterior portal venous branch best seen on image 40 of the venous phase postcontrast data set. Small thrombi are also likely present in the anterior right portal division seen on image 35 of venous phase imaging. The left portal vein is nearly completely occluded. Pancreas: There is a defect in the neck of the pancreas which communicates with a lobulated fluid collection that tracks into the lesser sac. The duct in the pancreatic neck at the site of the defect is lost within the pancreatic fluid. In total this area measures 3 x 2.3 cm. Fluid in the hepatic gastric recess of the lesser sac measures 6.2 x 3.6 cm. Fluid tracking along the splenorenal ligament into the left upper quadrant beneath the left hemidiaphragm, dominant collection measures 6.8 x 4.9 cm with another cylindrical tract extending towards a smaller collection in the left subdiaphragmatic space, this measures approximately 3.8 x 3.0 cm with the tract extending along approximately a 3.3 cm path bridging these 2 collections. Fluid in the anterior abdomen within the transverse mesocolon measures 5.0 x 2.8 cm. Loculated collections described above show increased signal on T1 compatible with proteinaceous or hemorrhagic contents particularly areas in the main portion of the lesser sac adjacent to stomach and spleen and in the transverse mesocolon. Diffuse intra-abdominal ascites is present along with extensive edema. Postcontrast the defect in  the neck of the pancreas is best seen on image 57 of the arterial phase measuring approximately 17 cm in greatest with. No signs of ductal dilation in the setting of presumed ductal interruption given other findings described above. Spleen: Splenic vein remains patent in the setting of SMV and portal thrombosis. Spleen is mildly enlarged but otherwise unremarkable. Adrenals/Urinary Tract:  Adrenal glands are normal. Kidneys with smooth contour show normal enhancement. Stomach/Bowel: Extensive edema of stomach and transverse colon in particular. Vascular/Lymphatic: Venous thrombosis with thrombus in the SMV, extending into main portal vein, also in the left and right portal branches greatest on the left. The peripheral splenic vein is patent. Thrombosis of right portal branches Other:  Diffuse pancreatic ascites as described. Musculoskeletal: Normal appearance of visualized bones. IMPRESSION: Necrotic pancreatitis with ductal interruption in the neck of the pancreas resulting in hemorrhagic or proteinaceous pancreatic collections in the lesser sac and in upper abdominal mesenteric structures and diffuse pancreatic ascites. 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 There is still some patency of main portal vein and right portal branches. Findings of hepatic inflammation versus early hepatic necrosis in the right hepatic lobe with markedly abnormal enhancement of  the liver on arterial phase imaging, becoming more normal on later phase with the exception of the area in the posterior right hemi liver. Extensive bowel edema worsened areas directly affected by pancreatic collections, particularly stomach and transverse colon. No signs of biliary ductal dilation with limited assessment of the biliary tree. No filling defects seen in the common bile duct but with numerous gallstones and evidence of sludge in the dependent portion of the gallbladder.  Critical Value/emergent results were called by telephone at the time of interpretation on 12/05/2019 at 8:21 am to providerJENNIFER [facility] , who verbally acknowledged these results. Electronically Signed   By: Donzetta Kohut M.D.   On: 12/05/2019 08:25      Assessment/Plan Hepatic necrosis of right lobe of liver SMV, main portal, left portal and right portal thrombosis - on heparin  Necrotizing pancreatitis with pancreatic ductal disruption No acute surgical plans at this time as the patient is stable with no evidence of an acute abdomen.  He is scheduled for re-attempt at stent placement possibly next week.  Given the severity of his findings on his MRI, if he were to require surgical intervention for this problem, he may very likely require transfer to a tertiary care facility.  We will follow.   FEN - per GI, liquids VTE - heparin gtt ID - none currently  Letha Cape, [facility] Surgery 12/06/2019, 4:42 PM Please see Amion for pager number during day hours 7:00am-4:30pm

## 2019-12-06 NOTE — Anesthesia Procedure Notes (Signed)
Procedure Name: Intubation Date/Time: 12/06/2019 10:34 AM Performed by: Lollie Sails, CRNA Pre-anesthesia Checklist: Patient identified, Emergency Drugs available, Suction available, Patient being monitored and Timeout performed Patient Re-evaluated:Patient Re-evaluated prior to induction Oxygen Delivery Method: Circle system utilized Preoxygenation: Pre-oxygenation with 100% oxygen Induction Type: IV induction Ventilation: Mask ventilation without difficulty Laryngoscope Size: Miller and 3 Grade View: Grade I Tube type: Oral Tube size: 7.5 mm Number of attempts: 1 Airway Equipment and Method: Stylet Placement Confirmation: ETT inserted through vocal cords under direct vision,  positive ETCO2 and breath sounds checked- equal and bilateral Secured at: 23 cm Tube secured with: Tape Dental Injury: Teeth and Oropharynx as per pre-operative assessment

## 2019-12-06 NOTE — Anesthesia Postprocedure Evaluation (Signed)
Anesthesia Post Note  Patient: Paul Allen  Procedure(s) Performed: ENDOSCOPIC RETROGRADE CHOLANGIOPANCREATOGRAPHY (ERCP) (N/A )     Patient location during evaluation: PACU Anesthesia Type: General Level of consciousness: awake and alert Pain management: pain level controlled Vital Signs Assessment: post-procedure vital signs reviewed and stable Respiratory status: spontaneous breathing, nonlabored ventilation, respiratory function stable and patient connected to nasal cannula oxygen Cardiovascular status: blood pressure returned to baseline and stable Postop Assessment: no apparent nausea or vomiting Anesthetic complications: no    Last Vitals:  Vitals:   12/06/19 1127 12/06/19 1130  BP: (!) 158/98 (!) 140/100  Pulse: 94 88  Resp: 20 20  Temp: 37.1 C   SpO2: 100% 99%    Last Pain:  Vitals:   12/06/19 1130  TempSrc:   PainSc: 0-No pain                 Jadelynn Boylan DAVID

## 2019-12-06 NOTE — Progress Notes (Signed)
ANTICOAGULATION CONSULT NOTE - Follow Up Consult  Pharmacy Consult for Heparin Indication: portal vein thrombosis  Allergies  Allergen Reactions  . Penicillins Rash    Has patient had a PCN reaction causing immediate rash, facial/tongue/throat swelling, SOB or lightheadedness with hypotension: yes Has patient had a PCN reaction causing severe rash involving mucus membranes or skin necrosis: Yes Has patient had a PCN reaction that required hospitalization: No Has patient had a PCN reaction occurring within the last 10 years: No If all of the above answers are "NO", then may proceed with Cephalosporin use.     Patient Measurements: Height: 5\' 9"  (175.3 cm) Weight: 164 lb (74.4 kg) IBW/kg (Calculated) : 70.7 Heparin Dosing Weight:   Vital Signs: Temp: 98.2 F (36.8 C) (12/18 2157) Temp Source: Oral (12/18 2157) BP: 133/84 (12/18 2157) Pulse Rate: 70 (12/18 2157)  Labs: Recent Labs    12/04/19 0528 12/05/19 0935 12/05/19 2044 12/06/19 0451 12/06/19 1238 12/06/19 2219  HGB 17.6* 15.9  --  14.5  --   --   HCT 53.4* 47.8  --  43.1  --   --   PLT 221 229  --  200  --   --   LABPROT  --  16.3*  --   --   --   --   INR  --  1.3*  --   --   --   --   HEPARINUNFRC  --   --  0.20*  --   --  <0.10*  CREATININE 0.47* 0.48*  --   --  0.38*  --     Estimated Creatinine Clearance: 124 mL/min (A) (by C-G formula based on SCr of 0.38 mg/dL (L)).   Medications:  Infusions:  . heparin 1,450 Units/hr (12/06/19 1655)  . lactated ringers Stopped (12/06/19 1132)  . meropenem (MERREM) IV 1 g (12/06/19 2243)    Assessment: Patient with low heparin level.  No issues with heparin per RN.  Goal of Therapy:  Heparin level 0.3-0.7 units/ml Monitor platelets by anticoagulation protocol: Yes   Plan:  Heparin bolus 1700  units iv x1 Increase Heparin drip to 1700 units/hr Next heparin level at 0800    Tyler Deis, Shea Stakes Crowford 12/06/2019,11:25 PM

## 2019-12-06 NOTE — Anesthesia Preprocedure Evaluation (Signed)
Anesthesia Evaluation  Patient identified by MRN, date of birth, ID band Patient awake    Reviewed: Allergy & Precautions, NPO status , Patient's Chart, lab work & pertinent test results  Airway Mallampati: I  TM Distance: >3 FB Neck ROM: Full    Dental   Pulmonary Current Smoker and Patient abstained from smoking.,    Pulmonary exam normal        Cardiovascular Normal cardiovascular exam     Neuro/Psych    GI/Hepatic   Endo/Other    Renal/GU      Musculoskeletal   Abdominal   Peds  Hematology   Anesthesia Other Findings   Reproductive/Obstetrics                             Anesthesia Physical Anesthesia Plan  ASA: II  Anesthesia Plan: General   Post-op Pain Management:    Induction: Intravenous  PONV Risk Score and Plan: 1 and Ondansetron  Airway Management Planned: Oral ETT  Additional Equipment:   Intra-op Plan:   Post-operative Plan: Extubation in OR  Informed Consent: I have reviewed the patients History and Physical, chart, labs and discussed the procedure including the risks, benefits and alternatives for the proposed anesthesia with the patient or authorized representative who has indicated his/her understanding and acceptance.       Plan Discussed with: CRNA and Surgeon  Anesthesia Plan Comments:         Anesthesia Quick Evaluation

## 2019-12-06 NOTE — Interval H&P Note (Signed)
History and Physical Interval Note:  12/06/2019 9:32 AM  Paul Allen  has presented today for surgery, with the diagnosis of Pancreatic duct leak.  The various methods of treatment have been discussed with the patient and family. After consideration of risks, benefits and other options for treatment, the patient has consented to  Procedure(s): ENDOSCOPIC RETROGRADE CHOLANGIOPANCREATOGRAPHY (ERCP) (N/A) as a surgical intervention.  The patient's history has been reviewed, patient examined, no change in status, stable for surgery.  I have reviewed the patient's chart and labs.  Questions were answered to the patient's satisfaction.     Milus Banister

## 2019-12-06 NOTE — Progress Notes (Signed)
PROGRESS NOTE    Paul CreedBrian E Cranmer  ZOX:[id number] DOB: 09/28/1981 DOA: 12/02/2019 PCP: Patient, No Pcp Per    Brief Narrative:  38 year old Caucasian male with history of alcohol abuse admitted with acute alcoholic pancreatitis complicated with pancreatic duct rupture and extensive portal vein and SMV thrombosis.   Assessment & Plan:   Active Problems:   Pancreatitis, acute   Elevated LFTs   Elevated hemoglobin (HCC)   Pancreatic ascites   Abdominal pain, epigastric   Portal vein thrombosis  Acute pancreatitis Complicated with main pancreatic duct rupture and portal vein/SMV thrombosis  Presumed to be secondary to alcohol.  Continues to have persistent pain and elevated lipase while of alcohol per patient since November 1.  Had a CT abdomen pelvis recently for similar complaints for which she had a CT abdomen pelvis that showed acute pancreatitis and inflammation of the hepatic flexure.  Right upper quadrant ultrasound this admission showed gallbladder sludge with no evidence of acute cholecystitis but incomplete color filling of portal vein concerning for portal vein thrombosis. MRI abdomen showing extensive portal vein/ SMV thrombosis with main pancreatic duct rupture and pancreatic fluid leak.  GI on board. Appreciate assistance. Meropenem, octreotide Unable to visualize main pancreatic duct on ERCP due to duodenal edema. ?plan for repeat ERCP next week. Surgery on board.  Continue IV fluids Pain management: dilaudid prn, oxycodone prn Clear liquids   Extensive portal vein/ SMV thrombosis ?complication of acute pancreatitis  Started on full anticoagulation with heparin   Elevated hemoglobin Unclear etiology. ?reactive. erythropoetin normal.  Much improved with fluids   Elevated LFTs ?hepatic injury from portal vein thrombosis    DVT prophylaxis: WJ:XBJYNWGOn:heparin gtt Code Status: Full code  Family Communication: discussed with patient  Disposition Plan: Pending medical  stability   Consultants:   GI  Procedures:  None  Antimicrobials:   Meropenem    Subjective: No acute events overnight.  Patient seen and examined today.  Abdominal pain better controlled with dilaudid and oxycodone. One episode of vomitus. No bowel movement. Passing gas.    Objective: Vitals:   12/05/19 0505 12/05/19 1511 12/05/19 2055 12/06/19 0517  BP: 125/78 130/88 126/83 138/87  Pulse: 76 73 70 70  Resp: 16 15 16 16   Temp: 99.5 F (37.5 C) 97.9 F (36.6 C) 98.1 F (36.7 C) 98.4 F (36.9 C)  TempSrc: Oral Oral Oral Oral  SpO2: 93% 98% 99% 98%  Weight:      Height:        Intake/Output Summary (Last 24 hours) at 12/06/2019 0815 Last data filed at 12/06/2019 [id number] Gross per 24 hour  Intake 4993 ml  Output 1825 ml  Net 3168 ml   Filed Weights   12/02/19 1933  Weight: 74.4 kg    Examination:  General exam: Appears calm and comfortable  Respiratory system: Clear to auscultation. Respiratory effort normal. Cardiovascular system: S1 & S2 heard, RRR. No JVD, murmurs. No pedal edema. Gastrointestinal system: Abdomen is nondistended, soft and epigastric and RUQ tenderness. Normal bowel sounds heard. Central nervous system: Alert and oriented. No focal neurological deficits. Extremities: Symmetric 5 x 5 power. Skin: No rashes, lesions or ulcers Psychiatry:  Mood & affect appropriate.   Data Reviewed: I have personally reviewed following labs and imaging studies  CBC: Recent Labs  Lab 12/02/19 2010 12/04/19 0528 12/05/19 0935 12/06/19 0451  WBC 15.5* 14.7* 11.3* 7.4  HGB 20.3* 17.6* 15.9 14.5  HCT 57.9* 53.4* 47.8 43.1  MCV 96.2 98.7 98.2 96.0  PLT 272  221 229 742   Basic Metabolic Panel: Recent Labs  Lab 12/02/19 2010 12/03/19 0503 12/04/19 0528 12/05/19 0935  NA 133* 133* 133* 133*  K 3.5 3.7 4.4 3.6  CL 88* 94* 94* 92*  CO2 29 26 26 28   GLUCOSE 112* 116* 121* 94  BUN 13 13 10 8   CREATININE 0.56* 0.52* 0.47* 0.48*  CALCIUM 9.4 8.5* 8.6*  8.4*   GFR: Estimated Creatinine Clearance: 124 mL/min (A) (by C-G formula based on SCr of 0.48 mg/dL (L)). Liver Function Tests: Recent Labs  Lab 12/02/19 2010 12/03/19 0503 12/04/19 0528 12/05/19 0935  AST 84* 484* 265* 67*  ALT 66* 272* 384* 189*  ALKPHOS 107 91 110 91  BILITOT 1.6* 2.1* 2.0* 2.7*  PROT 7.7 6.4* 6.0* 5.9*  ALBUMIN 4.2 3.5 3.1* 3.0*   Recent Labs  Lab 12/02/19 2010 12/03/19 0503  LIPASE 435* 354*   No results for input(s): AMMONIA in the last 168 hours. Coagulation Profile: Recent Labs  Lab 12/05/19 0935  INR 1.3*   Cardiac Enzymes: No results for input(s): CKTOTAL, CKMB, CKMBINDEX, TROPONINI in the last 168 hours. BNP (last 3 results) No results for input(s): PROBNP in the last 8760 hours. HbA1C: No results for input(s): HGBA1C in the last 72 hours. CBG: No results for input(s): GLUCAP in the last 168 hours. Lipid Profile: No results for input(s): CHOL, HDL, LDLCALC, TRIG, CHOLHDL, LDLDIRECT in the last 72 hours. Thyroid Function Tests: No results for input(s): TSH, T4TOTAL, FREET4, T3FREE, THYROIDAB in the last 72 hours. Anemia Panel: Recent Labs    12/04/19 0528  FERRITIN 507*   Sepsis Labs: No results for input(s): PROCALCITON, LATICACIDVEN in the last 168 hours.  Recent Results (from the past 240 hour(s))  SARS CORONAVIRUS 2 (TAT 6-24 HRS) Nasopharyngeal Nasopharyngeal Swab     Status: None   Collection Time: 12/03/19 12:42 AM   Specimen: Nasopharyngeal Swab  Result Value Ref Range Status   SARS Coronavirus 2 NEGATIVE NEGATIVE Final    Comment: (NOTE) SARS-CoV-2 target nucleic acids are NOT DETECTED. The SARS-CoV-2 RNA is generally detectable in upper and lower respiratory specimens during the acute phase of infection. Negative results do not preclude SARS-CoV-2 infection, do not rule out co-infections with other pathogens, and should not be used as the sole basis for treatment or other patient management decisions. Negative  results must be combined with clinical observations, patient history, and epidemiological information. The expected result is Negative. Fact Sheet for Patients: SugarRoll.be Fact Sheet for Healthcare Providers: https://www.woods-mathews.com/ This test is not yet approved or cleared by the [place] FDA and  has been authorized for detection and/or diagnosis of SARS-CoV-2 by FDA under an Emergency Use Authorization (EUA). This EUA will remain  in effect (meaning this test can be used) for the duration of the COVID-19 declaration under Section 56 4(b)(1) of the Act, 21 U.S.C. section 360bbb-3(b)(1), unless the authorization is terminated or revoked sooner. Performed at [facility], [place] [address] [place], [place] [address]          Radiology Studies: MR 3D Recon At Scanner  Result Date: 12/05/2019 CLINICAL DATA:  Acute abdominal pain, history of pancreatitis. EXAM: MRI ABDOMEN WITHOUT AND WITH CONTRAST (INCLUDING MRCP) TECHNIQUE: Multiplanar multisequence MR imaging of the abdomen was performed both before and after the administration of intravenous contrast. Heavily T2-weighted images of the biliary and pancreatic ducts were obtained, and three-dimensional MRCP images were rendered by post processing. CONTRAST:  41mL GADAVIST GADOBUTROL 1 MMOL/ML IV  SOLN COMPARISON:  CT evaluation of 11/20/2019 FINDINGS: Lower chest: Trace pleural fluid on the left. No pericardial effusion. Limited assessment of the lung bases on MRI. Hepatobiliary: MRCP images markedly limited secondary to diffuse intra-abdominal ascites and extensive edema. Extensive cholelithiasis and sludge in the dependent gallbladder. No large filling defect is seen in the biliary tree in the biliary tree is nondilated. See below for pancreatic findings of note. There is diffuse perihepatic ascites including fluid in the lesser sac about the caudate in the hepato gastric recess.  Diffusely abnormal enhancement of the liver on the arterial phase of the study. Patency of arterial vessels is not well assessed but there is evidence of portal thrombosis and SMV thrombosis, left portal vein is completely thrombosed though has some flow around the thrombus within the lumen period right posterior division portal branches are thrombosed with partial thrombus of anterior division branches. The central main portal vein and right portal venous branches remain opacified with contrast, not yet completely thrombosed. The SMV thrombus is sparing peripheral venous branches in the mesentery, is more central and extends along approximately 3 cm of the SMV, extending a short ways into the main portal vein. There are developing collaterals in short gastric and gastroepiploic branches. The central liver in areas subtended by thrombosed portal venous branches does not enhance to the same extent as other areas, most pronounced on the arterial phase data set involving mainly the right hepatic lobe but also medial segment of left hepatic lobe. Finding is predominantly central within the liver, best seen on image 43 is of the arterial phase portion of the exam. Heterogeneous enhancement normalizes to a large extent on venous phase with persistent heterogeneity of the posterior right hemi liver in hepatic subsegment VI and VII, adjacent to pancreatic fluid in Morrison's pouch, which is measures approximately 6.1 x 3.0 with striated appearance. This is in a segment of the liver subtended by a thrombosed right posterior portal venous branch best seen on image 40 of the venous phase postcontrast data set. Small thrombi are also likely present in the anterior right portal division seen on image 35 of venous phase imaging. The left portal vein is nearly completely occluded. Pancreas: There is a defect in the neck of the pancreas which communicates with a lobulated fluid collection that tracks into the lesser sac. The duct  in the pancreatic neck at the site of the defect is lost within the pancreatic fluid. In total this area measures 3 x 2.3 cm. Fluid in the hepatic gastric recess of the lesser sac measures 6.2 x 3.6 cm. Fluid tracking along the splenorenal ligament into the left upper quadrant beneath the left hemidiaphragm, dominant collection measures 6.8 x 4.9 cm with another cylindrical tract extending towards a smaller collection in the left subdiaphragmatic space, this measures approximately 3.8 x 3.0 cm with the tract extending along approximately a 3.3 cm path bridging these 2 collections. Fluid in the anterior abdomen within the transverse mesocolon measures 5.0 x 2.8 cm. Loculated collections described above show increased signal on T1 compatible with proteinaceous or hemorrhagic contents particularly areas in the main portion of the lesser sac adjacent to stomach and spleen and in the transverse mesocolon. Diffuse intra-abdominal ascites is present along with extensive edema. Postcontrast the defect in the neck of the pancreas is best seen on image 57 of the arterial phase measuring approximately 17 cm in greatest with. No signs of ductal dilation in the setting of presumed ductal interruption given other findings described  above. Spleen: Splenic vein remains patent in the setting of SMV and portal thrombosis. Spleen is mildly enlarged but otherwise unremarkable. Adrenals/Urinary Tract:  Adrenal glands are normal. Kidneys with smooth contour show normal enhancement. Stomach/Bowel: Extensive edema of stomach and transverse colon in particular. Vascular/Lymphatic: Venous thrombosis with thrombus in the SMV, extending into main portal vein, also in the left and right portal branches greatest on the left. The peripheral splenic vein is patent. Thrombosis of right portal branches Other:  Diffuse pancreatic ascites as described. Musculoskeletal: Normal appearance of visualized bones. IMPRESSION: Necrotic pancreatitis with  ductal interruption in the neck of the pancreas resulting in hemorrhagic or proteinaceous pancreatic collections in the lesser sac and in upper abdominal mesenteric structures and diffuse pancreatic ascites. 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 There is still some patency of main portal vein and right portal branches. Findings of hepatic inflammation versus early hepatic necrosis in the right hepatic lobe with markedly abnormal enhancement of the liver on arterial phase imaging, becoming more normal on later phase with the exception of the area in the posterior right hemi liver. Extensive bowel edema worsened areas directly affected by pancreatic collections, particularly stomach and transverse colon. No signs of biliary ductal dilation with limited assessment of the biliary tree. No filling defects seen in the common bile duct but with numerous gallstones and evidence of sludge in the dependent portion of the gallbladder. Critical Value/emergent results were called by telephone at the time of interpretation on 12/05/2019 at 8:21 am to providerJENNIFER [facility] , who verbally acknowledged these results. Electronically Signed   By: Donzetta Kohut M.D.   On: 12/05/2019 08:25   MR ABDOMEN MRCP W WO CONTAST  Result Date: 12/05/2019 CLINICAL DATA:  Acute abdominal pain, history of pancreatitis. EXAM: MRI ABDOMEN WITHOUT AND WITH CONTRAST (INCLUDING MRCP) TECHNIQUE: Multiplanar multisequence MR imaging of the abdomen was performed both before and after the administration of intravenous contrast. Heavily T2-weighted images of the biliary and pancreatic ducts were obtained, and three-dimensional MRCP images were rendered by post processing. CONTRAST:  7mL GADAVIST GADOBUTROL 1 MMOL/ML IV SOLN COMPARISON:  CT evaluation of 11/20/2019 FINDINGS: Lower chest: Trace pleural fluid on the left. No pericardial effusion. Limited assessment of the lung  bases on MRI. Hepatobiliary: MRCP images markedly limited secondary to diffuse intra-abdominal ascites and extensive edema. Extensive cholelithiasis and sludge in the dependent gallbladder. No large filling defect is seen in the biliary tree in the biliary tree is nondilated. See below for pancreatic findings of note. There is diffuse perihepatic ascites including fluid in the lesser sac about the caudate in the hepato gastric recess. Diffusely abnormal enhancement of the liver on the arterial phase of the study. Patency of arterial vessels is not well assessed but there is evidence of portal thrombosis and SMV thrombosis, left portal vein is completely thrombosed though has some flow around the thrombus within the lumen period right posterior division portal branches are thrombosed with partial thrombus of anterior division branches. The central main portal vein and right portal venous branches remain opacified with contrast, not yet completely thrombosed. The SMV thrombus is sparing peripheral venous branches in the mesentery, is more central and extends along approximately 3 cm of the SMV, extending a short ways into the main portal vein. There are developing collaterals in short gastric and gastroepiploic branches. The central liver in areas subtended by thrombosed portal venous branches does not enhance to the same  extent as other areas, most pronounced on the arterial phase data set involving mainly the right hepatic lobe but also medial segment of left hepatic lobe. Finding is predominantly central within the liver, best seen on image 43 is of the arterial phase portion of the exam. Heterogeneous enhancement normalizes to a large extent on venous phase with persistent heterogeneity of the posterior right hemi liver in hepatic subsegment VI and VII, adjacent to pancreatic fluid in Morrison's pouch, which is measures approximately 6.1 x 3.0 with striated appearance. This is in a segment of the liver subtended  by a thrombosed right posterior portal venous branch best seen on image 40 of the venous phase postcontrast data set. Small thrombi are also likely present in the anterior right portal division seen on image 35 of venous phase imaging. The left portal vein is nearly completely occluded. Pancreas: There is a defect in the neck of the pancreas which communicates with a lobulated fluid collection that tracks into the lesser sac. The duct in the pancreatic neck at the site of the defect is lost within the pancreatic fluid. In total this area measures 3 x 2.3 cm. Fluid in the hepatic gastric recess of the lesser sac measures 6.2 x 3.6 cm. Fluid tracking along the splenorenal ligament into the left upper quadrant beneath the left hemidiaphragm, dominant collection measures 6.8 x 4.9 cm with another cylindrical tract extending towards a smaller collection in the left subdiaphragmatic space, this measures approximately 3.8 x 3.0 cm with the tract extending along approximately a 3.3 cm path bridging these 2 collections. Fluid in the anterior abdomen within the transverse mesocolon measures 5.0 x 2.8 cm. Loculated collections described above show increased signal on T1 compatible with proteinaceous or hemorrhagic contents particularly areas in the main portion of the lesser sac adjacent to stomach and spleen and in the transverse mesocolon. Diffuse intra-abdominal ascites is present along with extensive edema. Postcontrast the defect in the neck of the pancreas is best seen on image 57 of the arterial phase measuring approximately 17 cm in greatest with. No signs of ductal dilation in the setting of presumed ductal interruption given other findings described above. Spleen: Splenic vein remains patent in the setting of SMV and portal thrombosis. Spleen is mildly enlarged but otherwise unremarkable. Adrenals/Urinary Tract:  Adrenal glands are normal. Kidneys with smooth contour show normal enhancement. Stomach/Bowel: Extensive  edema of stomach and transverse colon in particular. Vascular/Lymphatic: Venous thrombosis with thrombus in the SMV, extending into main portal vein, also in the left and right portal branches greatest on the left. The peripheral splenic vein is patent. Thrombosis of right portal branches Other:  Diffuse pancreatic ascites as described. Musculoskeletal: Normal appearance of visualized bones. IMPRESSION: Necrotic pancreatitis with ductal interruption in the neck of the pancreas resulting in hemorrhagic or proteinaceous pancreatic collections in the lesser sac and in upper abdominal mesenteric structures and diffuse pancreatic ascites. 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 There is still some patency of main portal vein and right portal branches. Findings of hepatic inflammation versus early hepatic necrosis in the right hepatic lobe with markedly abnormal enhancement of the liver on arterial phase imaging, becoming more normal on later phase with the exception of the area in the posterior right hemi liver. Extensive bowel edema worsened areas directly affected by pancreatic collections, particularly stomach and transverse colon. No signs of biliary ductal dilation with limited assessment of the biliary tree. No  filling defects seen in the common bile duct but with numerous gallstones and evidence of sludge in the dependent portion of the gallbladder. Critical Value/emergent results were called by telephone at the time of interpretation on 12/05/2019 at 8:21 am to providerJENNIFER [facility] , who verbally acknowledged these results. Electronically Signed   By: Donzetta Kohut M.D.   On: 12/05/2019 08:25        Scheduled Meds: . folic acid  1 mg Oral Daily  . multivitamin with minerals  1 tablet Oral Daily  . nicotine  7 mg Transdermal Daily  . octreotide  100 mcg Subcutaneous Q12H  . pantoprazole (PROTONIX) IV  40 mg Intravenous Q12H   . thiamine  100 mg Oral Daily   Continuous Infusions: . lactated ringers 250 mL/hr at 12/06/19 0636  . meropenem (MERREM) IV 1 g (12/06/19 0341)     LOS: 3 days    Time spent: Spent more than 30 minutes in coordinating care for this patient including bedside patient care.   Liborio Nixon, MD [facility] If 7PM-7AM, please contact night-coverage 12/06/2019, 8:15 AM

## 2019-12-06 NOTE — Op Note (Signed)
[facility] Patient Name: Paul Allen Procedure Date: 12/06/2019 MRN: [id number] Attending MD: Rachael Fee , MD Date of Birth: 29-May-1981 CSN: [id number] Age: 38 Admit Type: Inpatient Procedure:                ERCP Indications:              smouldering or recurrent acute etoh related                            pancreatitis led to main pancreatic duct disruption                            (MRI/MRCP yesterday), pancreatic ascites                            (clinically) Providers:                Rachael Fee, MD, Janae Sauce. Steele Berg, RN, [employer], Pensions consultant, Lynnell Jude. Freida Busman CRNA, CRNA Referring MD:              Medicines:                General Anesthesia Complications:            No immediate complications. Estimated Blood Loss:     Estimated blood loss: none. Procedure:                Pre-Anesthesia Assessment:                           - Prior to the procedure, a History and Physical                            was performed, and patient medications and                            allergies were reviewed. The patient's tolerance of                            previous anesthesia was also reviewed. The risks                            and benefits of the procedure and the sedation                            options and risks were discussed with the patient.                            All questions were answered, and informed consent                            was obtained. Prior Anticoagulants: The patient has                            taken no  previous anticoagulant or antiplatelet                            agents. ASA Grade Assessment: III - A patient with                            severe systemic disease. After reviewing the risks                            and benefits, the patient was deemed in                            satisfactory condition to undergo the procedure.                           After obtaining informed  consent, the scope was                            passed under direct vision. Throughout the                            procedure, the patient's blood pressure, pulse, and                            oxygen saturations were monitored continuously. The                            [id number] ([id number] Olympus duodenoscope was                            introduced through the mouth, and used to inject                            contrast into and used to locate the major papilla.                            The ERCP was accomplished without difficulty. The                            patient tolerated the procedure well. Scope In: Scope Out: Findings:      The scout film was normal. There was friable, moderate gastritis with a       suggestion of portal gastropathy changes in the proximal and mid       stomach. The duodenum was very edematous, friable. I was unable to       locate the major papilla after 20-30 minutes of exploration. The       duodenum in the bulb, second duodenum was very edematous causing luminal       narrowing. This limited the manueverability and visibility of the scope       significantly. I considered using fluoroscopic clues to blindly probe       with the wire in the region where I suspected the major papilla to be       however I was very concerned about the friability of the mucosa  and       creating a false tract through the duodenum and so I did not blindly       probe with the wire. Impression:               - Moderate, friable gastritis with a suggestion of                            portal gastropathy changes. No clear esophageal or                            gastric varices.                           - Significant edema causing liminal narrowing and                            friable mucosa from the duodenal bulb until 3rd                            duodenum segment. Becuase of these changes I could                            not identify the major  papilla. Moderate Sedation:      Not Applicable - Patient had care per Anesthesia. Recommendation:           - Return patient to hospital ward for ongoing care.                           - Will treat with IV fluids, continue IV                            antibiotics, continue ocreotide, attempt clear                            liquids and advance as tolerated. It is OK to                            resume his heparin for the PV clots.                           - Pending his clinical course could consider repeat                            ERCP attempt in the next several days to see if                            duodenal swelling has improved enough to be able to                            find his major papilla and place pancreatic duct                            stent for the disrupted main pancreatic duct.                           -  We will ask surgery to evaluate him as we may not                            be able to help the disrupted main pancreatic duct. Procedure Code(s):        --- Professional ---                           [phone], Esophagogastroduodenoscopy, flexible,                            transoral; diagnostic, including collection of                            specimen(s) by brushing or washing, when performed                            (separate procedure) Diagnosis Code(s):        --- Professional ---                           R10.9, Unspecified abdominal pain CPT copyright 2019 American Medical Association. All rights reserved. The codes documented in this report are preliminary and upon coder review may  be revised to meet current compliance requirements. Rachael Fee, MD 12/06/2019 12:00:07 PM This report has been signed electronically. Number of Addenda: 0

## 2019-12-06 NOTE — Progress Notes (Signed)
ANTICOAGULATION CONSULT NOTE - Follow Up Consult  Pharmacy Consult for Heparin Indication: portal vein thrombosis  Allergies  Allergen Reactions  . Penicillins Rash    Has patient had a PCN reaction causing immediate rash, facial/tongue/throat swelling, SOB or lightheadedness with hypotension: yes Has patient had a PCN reaction causing severe rash involving mucus membranes or skin necrosis: Yes Has patient had a PCN reaction that required hospitalization: No Has patient had a PCN reaction occurring within the last 10 years: No If all of the above answers are "NO", then may proceed with Cephalosporin use.     Patient Measurements: Height: 5\' 9"  (175.3 cm) Weight: 164 lb (74.4 kg) IBW/kg (Calculated) : 70.7 Heparin Dosing Weight:   Vital Signs: Temp: 98.1 F (36.7 C) (12/17 2055) Temp Source: Oral (12/17 2055) BP: 126/83 (12/17 2055) Pulse Rate: 70 (12/17 2055)  Labs: Recent Labs    12/03/19 0503 12/04/19 0528 12/05/19 0935 12/05/19 2044  HGB  --  17.6* 15.9  --   HCT  --  53.4* 47.8  --   PLT  --  221 229  --   LABPROT  --   --  16.3*  --   INR  --   --  1.3*  --   HEPARINUNFRC  --   --   --  0.20*  CREATININE 0.52* 0.47* 0.48*  --     Estimated Creatinine Clearance: 124 mL/min (A) (by C-G formula based on SCr of 0.48 mg/dL (L)).   Medications:  Infusions:  . heparin 1,450 Units/hr (12/05/19 2216)  . lactated ringers 250 mL/hr at 12/06/19 0142  . meropenem (MERREM) IV 1 g (12/05/19 2017)    Assessment: Patient with low heparin level.  No heparin issues per RN.  Prior order to stop heparin drip at 0700.  Goal of Therapy:  Heparin level 0.3-0.7 units/ml Monitor platelets by anticoagulation protocol: Yes   Plan:  Increase heparin to 1450 units/hr Stop heparin at 0700 per prior order.  Tyler Deis, Shea Stakes Crowford 12/06/2019,3:11 AM

## 2019-12-06 NOTE — Progress Notes (Signed)
ANTICOAGULATION CONSULT NOTE -  Consult  Pharmacy Consult for IV heparin Indication: portal vein thrombosis  Allergies  Allergen Reactions  . Penicillins Rash    Has patient had a PCN reaction causing immediate rash, facial/tongue/throat swelling, SOB or lightheadedness with hypotension: yes Has patient had a PCN reaction causing severe rash involving mucus membranes or skin necrosis: Yes Has patient had a PCN reaction that required hospitalization: No Has patient had a PCN reaction occurring within the last 10 years: No If all of the above answers are "NO", then may proceed with Cephalosporin use.     Patient Measurements: Height: 5\' 9"  (175.3 cm) Weight: 164 lb (74.4 kg) IBW/kg (Calculated) : 70.7 Heparin Dosing Weight:   Vital Signs: Temp: 98.3 F (36.8 C) (12/18 1444) Temp Source: Oral (12/18 1444) BP: 120/81 (12/18 1444) Pulse Rate: 73 (12/18 1444)  Labs: Recent Labs    12/04/19 0528 12/05/19 0935 12/05/19 2044 12/06/19 0451 12/06/19 1238  HGB 17.6* 15.9  --  14.5  --   HCT 53.4* 47.8  --  43.1  --   PLT 221 229  --  200  --   LABPROT  --  16.3*  --   --   --   INR  --  1.3*  --   --   --   HEPARINUNFRC  --   --  0.20*  --   --   CREATININE 0.47* 0.48*  --   --  0.38*    Estimated Creatinine Clearance: 124 mL/min (A) (by C-G formula based on SCr of 0.38 mg/dL (L)).   Medical History: Past Medical History:  Diagnosis Date  . Abdominal pain   . Nausea & vomiting   . Pancreatitis     Medications:  Scheduled:  . folic acid  1 mg Oral Daily  . indomethacin  100 mg Rectal Once  . multivitamin with minerals  1 tablet Oral Daily  . nicotine  7 mg Transdermal Daily  . octreotide  100 mcg Subcutaneous Q12H  . ondansetron (ZOFRAN) IV  4 mg Intravenous Q8H  . pantoprazole (PROTONIX) IV  40 mg Intravenous Q12H  . thiamine  100 mg Oral Daily    Assessment: Pharmacy is consulted to dose heparin in 38 yo male diagnosed with portal vein thrombosis. CT done on  12/17 shows "venous thrombosis which is extensive in the SMV, main portal, left portal and right portal. There is still some patency of main portal vein and right portal branches."   Prior to start of IV heparin, pt received a prophylactic dose of enoxaparin 40 mg this AM at 0853.   Today, 12/06/19  Scr 0.38, CrCl > 100 ml/min  CBC WNL  INR 1.3, PT 16.3  Per discussion with Dr. Ardis Hughs, ok to resume heparin now      Goal of Therapy:  Heparin level 0.3-0.7 units/ml Monitor platelets by anticoagulation protocol: Yes   Plan:   Resume  heparin at 1450 units/hr   Obtain HL 6 hours after start of drip   Daily CBC while on heparin  Monitor for signs and symptoms of bleeding     Royetta Asal, PharmD, BCPS 12/06/2019 2:56 PM

## 2019-12-06 NOTE — Transfer of Care (Signed)
Immediate Anesthesia Transfer of Care Note  Patient: Paul Allen  Procedure(s) Performed: ENDOSCOPIC RETROGRADE CHOLANGIOPANCREATOGRAPHY (ERCP) (N/A )  Patient Location: PACU and Endoscopy Unit  Anesthesia Type:General  Level of Consciousness: awake, alert  and patient cooperative  Airway & Oxygen Therapy: Patient Spontanous Breathing and Patient connected to face mask oxygen  Post-op Assessment: Report given to RN and Post -op Vital signs reviewed and stable  Post vital signs: Reviewed and stable  Last Vitals:  Vitals Value Taken Time  BP    Temp    Pulse    Resp    SpO2      Last Pain:  Vitals:   12/06/19 0940  TempSrc: Temporal  PainSc: 8       Patients Stated Pain Goal: 0 (38/32/91 9166)  Complications: No apparent anesthesia complications

## 2019-12-07 DIAGNOSIS — I81 Portal vein thrombosis: Secondary | ICD-10-CM

## 2019-12-07 DIAGNOSIS — R7989 Other specified abnormal findings of blood chemistry: Secondary | ICD-10-CM

## 2019-12-07 DIAGNOSIS — R188 Other ascites: Secondary | ICD-10-CM

## 2019-12-07 DIAGNOSIS — K859 Acute pancreatitis without necrosis or infection, unspecified: Secondary | ICD-10-CM

## 2019-12-07 DIAGNOSIS — K839 Disease of biliary tract, unspecified: Secondary | ICD-10-CM

## 2019-12-07 LAB — CBC
HCT: 46.1 % (ref 39.0–52.0)
Hemoglobin: 15 g/dL (ref 13.0–17.0)
MCH: 32.5 pg (ref 26.0–34.0)
MCHC: 32.5 g/dL (ref 30.0–36.0)
MCV: 100 fL (ref 80.0–100.0)
Platelets: 244 10*3/uL (ref 150–400)
RBC: 4.61 MIL/uL (ref 4.22–5.81)
RDW: 13.6 % (ref 11.5–15.5)
WBC: 6.8 10*3/uL (ref 4.0–10.5)
nRBC: 0 % (ref 0.0–0.2)

## 2019-12-07 LAB — COMPREHENSIVE METABOLIC PANEL
ALT: 93 U/L — ABNORMAL HIGH (ref 0–44)
AST: 31 U/L (ref 15–41)
Albumin: 2.8 g/dL — ABNORMAL LOW (ref 3.5–5.0)
Alkaline Phosphatase: 76 U/L (ref 38–126)
Anion gap: 11 (ref 5–15)
BUN: <5 mg/dL — ABNORMAL LOW (ref 6–20)
CO2: 31 mmol/L (ref 22–32)
Calcium: 8.2 mg/dL — ABNORMAL LOW (ref 8.9–10.3)
Chloride: 95 mmol/L — ABNORMAL LOW (ref 98–111)
Creatinine, Ser: 0.37 mg/dL — ABNORMAL LOW (ref 0.61–1.24)
GFR calc Af Amer: >60 mL/min (ref >60)
GFR calc non Af Amer: >60 mL/min (ref >60)
Glucose, Bld: 75 mg/dL (ref 70–99)
Potassium: 3.6 mmol/L (ref 3.5–5.1)
Sodium: 137 mmol/L (ref 135–145)
Total Bilirubin: 1.5 mg/dL — ABNORMAL HIGH (ref 0.3–1.2)
Total Protein: 5.6 g/dL — ABNORMAL LOW (ref 6.5–8.1)

## 2019-12-07 LAB — HEPARIN LEVEL (UNFRACTIONATED)
Heparin Unfractionated: 0.24 IU/mL — ABNORMAL LOW (ref 0.30–0.70)
Heparin Unfractionated: 0.41 IU/mL (ref 0.30–0.70)
Heparin Unfractionated: 0.39 IU/mL (ref 0.30–0.70)

## 2019-12-07 NOTE — Progress Notes (Signed)
ANTICOAGULATION CONSULT NOTE -  Consult  Pharmacy Consult for IV heparin Indication: portal vein thrombosis  Allergies  Allergen Reactions  . Penicillins Rash    Has patient had a PCN reaction causing immediate rash, facial/tongue/throat swelling, SOB or lightheadedness with hypotension: yes Has patient had a PCN reaction causing severe rash involving mucus membranes or skin necrosis: Yes Has patient had a PCN reaction that required hospitalization: No Has patient had a PCN reaction occurring within the last 10 years: No If all of the above answers are "NO", then may proceed with Cephalosporin use.     Patient Measurements: Height: 5\' 9"  (175.3 cm) Weight: 184 lb 12.8 oz (83.8 kg) IBW/kg (Calculated) : 70.7 HEPARIN DW (KG): 74.4  Vital Signs: Temp: 98.7 F (37.1 C) (12/19 0609) Temp Source: Oral (12/19 0609) BP: 134/91 (12/19 0609) Pulse Rate: 64 (12/19 0609)  Labs: Recent Labs    12/05/19 0935 12/05/19 2044 12/06/19 0451 12/06/19 1238 12/06/19 2219 12/07/19 0744  HGB 15.9  --  14.5  --   --   --   HCT 47.8  --  43.1  --   --   --   PLT 229  --  200  --   --   --   LABPROT 16.3*  --   --   --   --   --   INR 1.3*  --   --   --   --   --   HEPARINUNFRC  --  0.20*  --   --  <0.10* 0.24*  CREATININE 0.48*  --   --  0.38*  --   --     Estimated Creatinine Clearance: 124 mL/min (A) (by C-G formula based on SCr of 0.38 mg/dL (L)).   Medical History: Past Medical History:  Diagnosis Date  . Abdominal pain   . Nausea & vomiting   . Pancreatitis     Medications:  Scheduled:  . folic acid  1 mg Oral Daily  . indomethacin  100 mg Rectal Once  . multivitamin with minerals  1 tablet Oral Daily  . nicotine  7 mg Transdermal Daily  . octreotide  100 mcg Subcutaneous Q12H  . ondansetron (ZOFRAN) IV  4 mg Intravenous Q8H  . pantoprazole (PROTONIX) IV  40 mg Intravenous Q12H  . thiamine  100 mg Oral Daily    Assessment: Pharmacy is consulted to dose heparin in 38  yo male diagnosed with portal vein thrombosis. CT done on 12/17 shows "venous thrombosis which is extensive in the SMV, main portal, left portal and right portal. There is still some patency of main portal vein and right portal branches."  Baseline coag labs & CBC WNL.  Prior to start of IV heparin, pt received a prophylactic dose of enoxaparin 40 mg at 0853.   Today, 12/07/19  Heparin level still subtherapeutic (0.24) on increased heparin infusion rate of 1700 units/hr  CBC WNL  No bleeding or infusion related issues per nursing report    Goal of Therapy:  Heparin level 0.3-0.7 units/ml Monitor platelets by anticoagulation protocol: Yes   Plan:   Increase heparin infusion to 1850 units/hr   Recheck heparin level in 6h   Daily Heparin level & CBC while on heparin  Monitor for signs and symptoms of bleeding  Netta Cedars, PharmD, BCPS 12/07/2019 9:43 AM

## 2019-12-07 NOTE — Progress Notes (Signed)
Pharmacy Brief Follow Up Note - Anticoagulation:  Pt on heparin infusion for portal vein thrombosis and is s/p ERCP done on 12/18.   Assessment:  HL = 0.41 is therapeutic on heparin infusion of 1850 units/hr  Confirmed with RN that heparin infusing at correct rate. No bleeding/bruising.  Goal: HL 0.3 - 0.7  Plan:  Continue heparin infusion at current rate of 1850 units/hr  Check confirmatory HL in 6 hours  Daily HL and CBC  Monitor for signs/symptoms of bleeding  Follow along for transition to PO anticoagulation  Lenis Noon, PharmD 12/07/19 5:13 PM

## 2019-12-07 NOTE — Progress Notes (Addendum)
PROGRESS NOTE    Paul Allen  WUJ:[id number] DOB: Mar 16, 1981 DOA: 12/02/2019 PCP: Patient, No Pcp Per    Brief Narrative:  38 year old Caucasian male with history of alcohol abuse admitted with acute alcoholic pancreatitis complicated with pancreatic duct rupture and extensive portal vein and SMV thrombosis.   Assessment & Plan:   Active Problems:   Pancreatitis, acute   Elevated LFTs   Elevated hemoglobin (HCC)   Pancreatic ascites   Abdominal pain, epigastric   Portal vein thrombosis   Bile leak  Acute necrotizing pancreatitis Complicated with main pancreatic duct rupture and portal vein/SMV thrombosis  Presumed to be secondary to alcohol.  Continues to have persistent pain and elevated lipase while of alcohol per patient since November 1.  Had a CT abdomen pelvis recently for similar complaints for which she had a CT abdomen pelvis that showed acute pancreatitis and inflammation of the hepatic flexure.  Right upper quadrant ultrasound this admission showed gallbladder sludge with no evidence of acute cholecystitis but incomplete color filling of portal vein concerning for portal vein thrombosis. MRI abdomen showing extensive portal vein/ SMV thrombosis with main pancreatic duct rupture and pancreatic fluid leak.  GI on board. Appreciate assistance. Prophylactic meropenem, octreotide. Trend daily cbc, cmp Surgery consulted, plan for surgical management at this time  Unable to visualize main pancreatic duct on ERCP due to duodenal edema. ?plan for repeat ERCP next week. Surgery on board.  Continue IV fluids Pain management: dilaudid prn, oxycodone prn Clear liquids  ekg ordered to monitor qtc while on zofran   Extensive portal vein/ SMV thrombosis ?complication of acute pancreatitis  Started on full anticoagulation with heparin   Non tender scrotal swelling  Appears to be dependent edema 2/2 ?SMV thrombosis with continuous IV fluids  Scrotal support ordered If pain  worsens or develops redness, will get scrotal ultrasound   Elevated hemoglobin Unclear etiology. ?reactive. erythropoetin normal.  Much improved with fluids   Elevated LFTs ?hepatic injury from portal vein thrombosis  Trend daily CMP  DVT prophylaxis: [id number] gtt Code Status: Full code  Family Communication: discussed with patient  Disposition Plan: Pending medical stability   Consultants:   GI, surgery   Procedures:  ERCP  Antimicrobials:   Meropenem    Subjective: No acute events overnight.  Patient seen and examined today.  Complains of scrotal swelling. Noticed it last night. Denies pain but when he is standing he can feel a 'stretching pain' through his upper thigh. He noticed swelling improved while lying down and worsened while standing. Abdominal pain is much improved. Able to tolerate liquid diet with nausea, vomiting. Passing gas.   Objective: Vitals:   12/06/19 1444 12/06/19 2157 12/07/19 0609 12/07/19 1348  BP: 120/81 133/84 (!) 134/91 126/84  Pulse: 73 70 64 67  Resp: 14 16 16 17   Temp: 98.3 F (36.8 C) 98.2 F (36.8 C) 98.7 F (37.1 C) 98.2 F (36.8 C)  TempSrc: Oral Oral Oral Oral  SpO2: 92% 95% 96% 94%  Weight:   83.8 kg   Height:        Intake/Output Summary (Last 24 hours) at 12/07/2019 1634 Last data filed at 12/07/2019 1452 Gross per 24 hour  Intake 1943.81 ml  Output 2750 ml  Net -806.19 ml   Filed Weights   12/02/19 1933 12/07/19 0609  Weight: 74.4 kg 83.8 kg    Examination:  General exam: Appears calm and comfortable  Respiratory system: Clear to auscultation. Respiratory effort normal. Cardiovascular system: S1 & S2  heard, RRR. No JVD, murmurs. No pedal edema. Gastrointestinal system: Abdomen is nondistended, soft and epigastric and RUQ tenderness. Normal bowel sounds heard. Genital exam : (done in presence of a Chaperon, RN) scrotal and pubic swelling noted, no redness of overlying skin. Mildly tender on palpation.     Central nervous system: Alert and oriented. No focal neurological deficits. Extremities: Symmetric 5 x 5 power. Skin: No rashes, lesions or ulcers Psychiatry:  Mood & affect appropriate.   Data Reviewed: I have personally reviewed following labs and imaging studies  CBC: Recent Labs  Lab 12/02/19 2010 12/04/19 0528 12/05/19 0935 12/06/19 0451 12/07/19 1241  WBC 15.5* 14.7* 11.3* 7.4 6.8  HGB 20.3* 17.6* 15.9 14.5 15.0  HCT 57.9* 53.4* 47.8 43.1 46.1  MCV 96.2 98.7 98.2 96.0 100.0  PLT 272 221 229 200 244   Basic Metabolic Panel: Recent Labs  Lab 12/02/19 2010 12/03/19 0503 12/04/19 0528 12/05/19 0935 12/06/19 1238  NA 133* 133* 133* 133* 134*  K 3.5 3.7 4.4 3.6 3.2*  CL 88* 94* 94* 92* 95*  CO2 29 26 26 28 27   GLUCOSE 112* 116* 121* 94 70  BUN 13 13 10 8 6   CREATININE 0.56* 0.52* 0.47* 0.48* 0.38*  CALCIUM 9.4 8.5* 8.6* 8.4* 8.1*  MG  --   --   --   --  1.6*   GFR: Estimated Creatinine Clearance: 124 mL/min (A) (by C-G formula based on SCr of 0.38 mg/dL (L)). Liver Function Tests: Recent Labs  Lab 12/02/19 2010 12/03/19 0503 12/04/19 0528 12/05/19 0935 12/06/19 1238  AST 84* 484* 265* 67* 30  ALT 66* 272* 384* 189* 105*  ALKPHOS 107 91 110 91 72  BILITOT 1.6* 2.1* 2.0* 2.7* 1.7*  PROT 7.7 6.4* 6.0* 5.9* 5.2*  ALBUMIN 4.2 3.5 3.1* 3.0* 2.5*   Recent Labs  Lab 12/02/19 2010 12/03/19 0503  LIPASE 435* 354*   No results for input(s): AMMONIA in the last 168 hours. Coagulation Profile: Recent Labs  Lab 12/05/19 0935  INR 1.3*   Cardiac Enzymes: No results for input(s): CKTOTAL, CKMB, CKMBINDEX, TROPONINI in the last 168 hours. BNP (last 3 results) No results for input(s): PROBNP in the last 8760 hours. HbA1C: No results for input(s): HGBA1C in the last 72 hours. CBG: No results for input(s): GLUCAP in the last 168 hours. Lipid Profile: No results for input(s): CHOL, HDL, LDLCALC, TRIG, CHOLHDL, LDLDIRECT in the last 72 hours. Thyroid  Function Tests: No results for input(s): TSH, T4TOTAL, FREET4, T3FREE, THYROIDAB in the last 72 hours. Anemia Panel: No results for input(s): VITAMINB12, FOLATE, FERRITIN, TIBC, IRON, RETICCTPCT in the last 72 hours. Sepsis Labs: No results for input(s): PROCALCITON, LATICACIDVEN in the last 168 hours.  Recent Results (from the past 240 hour(s))  SARS CORONAVIRUS 2 (TAT 6-24 HRS) Nasopharyngeal Nasopharyngeal Swab     Status: None   Collection Time: 12/03/19 12:42 AM   Specimen: Nasopharyngeal Swab  Result Value Ref Range Status   SARS Coronavirus 2 NEGATIVE NEGATIVE Final    Comment: (NOTE) SARS-CoV-2 target nucleic acids are NOT DETECTED. The SARS-CoV-2 RNA is generally detectable in upper and lower respiratory specimens during the acute phase of infection. Negative results do not preclude SARS-CoV-2 infection, do not rule out co-infections with other pathogens, and should not be used as the sole basis for treatment or other patient management decisions. Negative results must be combined with clinical observations, patient history, and epidemiological information. The expected result is Negative. Fact Sheet for Patients: HairSlick.no  Fact Sheet for Healthcare Providers: quierodirigir.com This test is not yet approved or cleared by the [place] FDA and  has been authorized for detection and/or diagnosis of SARS-CoV-2 by FDA under an Emergency Use Authorization (EUA). This EUA will remain  in effect (meaning this test can be used) for the duration of the COVID-19 declaration under Section 56 4(b)(1) of the Act, 21 U.S.C. section 360bbb-3(b)(1), unless the authorization is terminated or revoked sooner. Performed at [facility], [address] [place], [place] [address]          Radiology Studies: DG ERCP BILIARY & PANCREATIC DUCTS  Result Date: 12/06/2019 CLINICAL DATA:  38 year old male with a history of  pancreatitis EXAM: ERCP TECHNIQUE: Multiple spot images obtained with the fluoroscopic device and submitted for interpretation post-procedure. FLUOROSCOPY TIME:  Fluoroscopy Time:  0 minutes 7 seconds COMPARISON:  MR 12/04/2019 FINDINGS: Limited intraoperative fluoroscopic spot images. Single image demonstrates the endoscope projecting over the upper abdomen. IMPRESSION: Limited images of ERCP with single image demonstrating the endoscope over the upper abdomen. Please refer to the dictated operative report for full details of intraoperative findings and procedure. Electronically Signed   By: Gilmer Mor D.O.   On: 12/06/2019 12:29        Scheduled Meds: . folic acid  1 mg Oral Daily  . indomethacin  100 mg Rectal Once  . multivitamin with minerals  1 tablet Oral Daily  . nicotine  7 mg Transdermal Daily  . octreotide  100 mcg Subcutaneous Q12H  . ondansetron (ZOFRAN) IV  4 mg Intravenous Q8H  . pantoprazole (PROTONIX) IV  40 mg Intravenous Q12H  . thiamine  100 mg Oral Daily   Continuous Infusions: . heparin 1,850 Units/hr (12/07/19 1002)  . lactated ringers 125 mL/hr at 12/07/19 1405  . meropenem (MERREM) IV 1 g (12/07/19 1310)     LOS: 4 days    Time spent: Spent more than 30 minutes in coordinating care for this patient including bedside patient care.   Liborio Nixon, MD [facility] If 7PM-7AM, please contact night-coverage 12/07/2019, 4:34 PM

## 2019-12-07 NOTE — Progress Notes (Signed)
Progress Note   Subjective  Chief Complaint: Recurrent pancreatitis, pancreatic duct disruption, multiple fluid collections in the abdomen  This morning, the patient reports that he is actually feeling better.  He feels like the Dilaudid has been able to control his pain and he was actually able to get some sleep last night.  Tells me that he has this medication scheduled but he did not have to have a couple of doses because he was asleep overnight.  Tolerated his clear liquid diet yesterday.  Tells me that he is having some swelling in his "penis and balls", he realized this when he was ambulating up and down the halls yesterday.  Still passing gas.   Objective   Vital signs in last 24 hours: Temp:  [98.2 F (36.8 C)-98.7 F (37.1 C)] 98.7 F (37.1 C) (12/19 0609) Pulse Rate:  [64-94] 64 (12/19 0609) Resp:  [14-20] 16 (12/19 0609) BP: (120-158)/(81-100) 134/91 (12/19 0609) SpO2:  [92 %-100 %] 96 % (12/19 0609) Weight:  [83.8 kg] 83.8 kg (12/19 0609) Last BM Date: 11/30/19 General:    white male in NAD Heart:  Regular rate and rhythm; no murmurs Lungs: Respirations even and unlabored, lungs CTA bilaterally Abdomen:  Soft, marked epigastric and right lower quadrant TTP and nondistended. Normal bowel sounds. Extremities:  Without edema. Neurologic:  Alert and oriented,  grossly normal neurologically. Psych:  Cooperative. Normal mood and affect.  Intake/Output from previous day: 12/18 0701 - 12/19 0700 In: 2498.8 [P.O.:920; I.V.:932.6; IV Piggyback:646.3] Out: 1550 [Urine:1550]  Lab Results: Recent Labs    12/05/19 0935 12/06/19 0451  WBC 11.3* 7.4  HGB 15.9 14.5  HCT 47.8 43.1  PLT 229 200   BMET Recent Labs    12/05/19 0935 12/06/19 1238  NA 133* 134*  K 3.6 3.2*  CL 92* 95*  CO2 28 27  GLUCOSE 94 70  BUN 8 6  CREATININE 0.48* 0.38*  CALCIUM 8.4* 8.1*   LFT Recent Labs    12/06/19 1238  PROT 5.2*  ALBUMIN 2.5*  AST 30  ALT 105*  ALKPHOS 72   BILITOT 1.7*   PT/INR Recent Labs    12/05/19 0935  LABPROT 16.3*  INR 1.3*    Studies/Results: DG ERCP BILIARY & PANCREATIC DUCTS  Result Date: 12/06/2019 CLINICAL DATA:  38 year old male with a history of pancreatitis EXAM: ERCP TECHNIQUE: Multiple spot images obtained with the fluoroscopic device and submitted for interpretation post-procedure. FLUOROSCOPY TIME:  Fluoroscopy Time:  0 minutes 7 seconds COMPARISON:  MR 12/04/2019 FINDINGS: Limited intraoperative fluoroscopic spot images. Single image demonstrates the endoscope projecting over the upper abdomen. IMPRESSION: Limited images of ERCP with single image demonstrating the endoscope over the upper abdomen. Please refer to the dictated operative report for full details of intraoperative findings and procedure. Electronically Signed   By: Corrie Mckusick D.O.   On: 12/06/2019 12:29    Assessment / Plan:   Assessment: 1.  Acute pancreatitis: Due to alcohol, with developing necrosis and portal vein as well as SMV thrombosis as well as ductal interruption in the neck of the pancreas, ERCP attempted for pancreatic duct stent placement 12/06/2019 was unsuccessful 2.  Elevated LFTs 3.  Hyperbilirubinemia 4.  Portal vein thrombosis and SMV thrombosis 5.  Alcohol use disorder  Plan: 1.  Continue current supportive therapy including fluids and pain medications 2.  Continue clear liquid diet 3.  Appreciate surgical team's recommendations 4.  Please await any further recommendations from Dr. Bryan Lemma later today  Thank you for your kind consultation, we will continue to follow.    LOS: 4 days   Unk Lightning  12/07/2019, 9:38 AM

## 2019-12-08 ENCOUNTER — Inpatient Hospital Stay (HOSPITAL_COMMUNITY): Payer: Self-pay

## 2019-12-08 DIAGNOSIS — K8050 Calculus of bile duct without cholangitis or cholecystitis without obstruction: Secondary | ICD-10-CM

## 2019-12-08 LAB — COMPREHENSIVE METABOLIC PANEL
ALT: 69 U/L — ABNORMAL HIGH (ref 0–44)
AST: 25 U/L (ref 15–41)
Albumin: 2.5 g/dL — ABNORMAL LOW (ref 3.5–5.0)
Alkaline Phosphatase: 77 U/L (ref 38–126)
Anion gap: 11 (ref 5–15)
BUN: <5 mg/dL — ABNORMAL LOW (ref 6–20)
CO2: 30 mmol/L (ref 22–32)
Calcium: 8.1 mg/dL — ABNORMAL LOW (ref 8.9–10.3)
Chloride: 96 mmol/L — ABNORMAL LOW (ref 98–111)
Creatinine, Ser: 0.42 mg/dL — ABNORMAL LOW (ref 0.61–1.24)
GFR calc Af Amer: >60 mL/min (ref >60)
GFR calc non Af Amer: >60 mL/min (ref >60)
Glucose, Bld: 90 mg/dL (ref 70–99)
Potassium: 3.3 mmol/L — ABNORMAL LOW (ref 3.5–5.1)
Sodium: 137 mmol/L (ref 135–145)
Total Bilirubin: 1 mg/dL (ref 0.3–1.2)
Total Protein: 5.3 g/dL — ABNORMAL LOW (ref 6.5–8.1)

## 2019-12-08 LAB — CBC
HCT: 42.8 % (ref 39.0–52.0)
Hemoglobin: 14 g/dL (ref 13.0–17.0)
MCH: 31.9 pg (ref 26.0–34.0)
MCHC: 32.7 g/dL (ref 30.0–36.0)
MCV: 97.5 fL (ref 80.0–100.0)
Platelets: 254 10*3/uL (ref 150–400)
RBC: 4.39 MIL/uL (ref 4.22–5.81)
RDW: 13.5 % (ref 11.5–15.5)
WBC: 5.7 10*3/uL (ref 4.0–10.5)
nRBC: 0 % (ref 0.0–0.2)

## 2019-12-08 LAB — HEPARIN LEVEL (UNFRACTIONATED): Heparin Unfractionated: 0.44 IU/mL (ref 0.30–0.70)

## 2019-12-08 MED ORDER — POTASSIUM CHLORIDE 10 MEQ/100ML IV SOLN
10.0000 meq | INTRAVENOUS | Status: AC
  Administered 2019-12-08 (×3): 10 meq via INTRAVENOUS
  Filled 2019-12-08 (×3): qty 100

## 2019-12-08 MED ORDER — IOHEXOL 9 MG/ML PO SOLN
ORAL | Status: AC
  Administered 2019-12-08: 1000 mL
  Filled 2019-12-08: qty 1000

## 2019-12-08 MED ORDER — IOHEXOL 300 MG/ML  SOLN
100.0000 mL | Freq: Once | INTRAMUSCULAR | Status: AC | PRN
  Administered 2019-12-08: 100 mL via INTRAVENOUS

## 2019-12-08 MED ORDER — SODIUM CHLORIDE (PF) 0.9 % IJ SOLN
INTRAMUSCULAR | Status: AC
  Filled 2019-12-08: qty 50

## 2019-12-08 NOTE — Progress Notes (Addendum)
Progress Note   Subjective  Chief Complaint: Recurrent pancreatitis, pancreatic duct disruption, multiple fluid collections in the abdomen  This morning, patient continues to report that he is feeling better.  He was able to urinate a lot last night and feels like his penile edema is improving after raising his legs overnight.  Does report 2 bowel movements overnight.  Still able to tolerate clear liquid diet with no further nausea or vomiting.   Objective   Vital signs in last 24 hours: Temp:  [98.2 F (36.8 C)-98.3 F (36.8 C)] 98.2 F (36.8 C) (12/20 0608) Pulse Rate:  [64-67] 64 (12/20 0608) Resp:  [17] 17 (12/20 0608) BP: (122-142)/(84-89) 142/86 (12/20 0608) SpO2:  [92 %-95 %] 95 % (12/20 0608) Last BM Date: 12/08/19 General:    white male in NAD Heart:  Regular rate and rhythm; no murmurs Lungs: Respirations even and unlabored, lungs CTA bilaterally Abdomen:  Soft, marked epigastric and RUQ ttp and mild distension. Normal bowel sounds. Extremities:  Without edema. Neurologic:  Alert and oriented,  grossly normal neurologically. Psych:  Cooperative. Normal mood and affect.  Intake/Output from previous day: 12/19 0701 - 12/20 0700 In: 480 [P.O.:480] Out: 1275 [Urine:1275] Intake/Output this shift: Total I/O In: 980 [P.O.:480; IV Piggyback:500] Out: 800 [Urine:800]  Lab Results: Recent Labs    12/06/19 0451 12/07/19 1241 12/08/19 0505  WBC 7.4 6.8 5.7  HGB 14.5 15.0 14.0  HCT 43.1 46.1 42.8  PLT 200 244 254   BMET Recent Labs    12/06/19 1238 12/07/19 1241 12/08/19 0505  NA 134* 137 137  K 3.2* 3.6 3.3*  CL 95* 95* 96*  CO2 27 31 30   GLUCOSE 70 75 90  BUN 6 <5* <5*  CREATININE 0.38* 0.37* 0.42*  CALCIUM 8.1* 8.2* 8.1*   LFT Recent Labs    12/08/19 0505  PROT 5.3*  ALBUMIN 2.5*  AST 25  ALT 69*  ALKPHOS 77  BILITOT 1.0   Studies/Results: DG ERCP BILIARY & PANCREATIC DUCTS  Result Date: 12/06/2019 CLINICAL DATA:  38 year old male  with a history of pancreatitis EXAM: ERCP TECHNIQUE: Multiple spot images obtained with the fluoroscopic device and submitted for interpretation post-procedure. FLUOROSCOPY TIME:  Fluoroscopy Time:  0 minutes 7 seconds COMPARISON:  MR 12/04/2019 FINDINGS: Limited intraoperative fluoroscopic spot images. Single image demonstrates the endoscope projecting over the upper abdomen. IMPRESSION: Limited images of ERCP with single image demonstrating the endoscope over the upper abdomen. Please refer to the dictated operative report for full details of intraoperative findings and procedure. Electronically Signed   By: Corrie Mckusick D.O.   On: 12/06/2019 12:29    Assessment / Plan:   Assessment: 1.  Acute pancreatitis: Due to alcohol, with developing necrosis and portal vein as well as SMV thrombosis and ductal interruption in the neck of the pancreas, ERCP attempted 12/06/2019 for pancreatic duct stent placement which was unsuccessful 2.  Elevated LFTs 3.  Hyperbilirubinemia 4.  Portal vein thrombosis and SMV thrombosis 5.  Alcohol use disorder 6. Hypokalmia: K 3.3 this morning  Plan: 1.  Continue current supportive therapy including fluids and pain medications 2.  Continue clear liquid diet 3.  We will reorder CT abdomen and pelvis to be completed this evening 4.  Pending results of above will see when we may be able to reschedule ERCP with second attempt at placing pancreatic duct stent 5.  Continue Octreotide 6.  Continue Meropenem and IV Heparin as well as high-dose IV PPI 7. Agree  with K replacement 8.  Please await any further recommendations from Dr. Barron Alvine later today  Thank you for your kind consultation, Dr. Chales Abrahams will be taking over for our service tomorrow.    LOS: 5 days   Unk Lightning  12/08/2019, 11:06 AM

## 2019-12-08 NOTE — Progress Notes (Addendum)
PROGRESS NOTE    Paul Allen  AST:[id number] DOB: 11-26-1981 DOA: 12/02/2019 PCP: Patient, No Pcp Per    Brief Narrative: 38 year old Caucasian male with history of alcohol abuse admitted with acute alcoholic pancreatitis complicated with pancreatic duct rupture and extensive portal vein and SMV thrombosis.  Assessment & Plan:   Active Problems:   Pancreatitis, acute   Elevated LFTs   Elevated hemoglobin (HCC)   Pancreatic ascites   Abdominal pain, epigastric   Portal vein thrombosis   Bile leak   #1 acute necrotizing pancreatitis with main pancreatic duct rupture SMV thrombosis and portal vein thrombosis likely secondary to alcohol abuse. CT abdomen showed acute pancreatitis and inflammation of the hepatic flexure. Right upper quadrant ultrasound was concerning for portal vein thrombosis. MRI abdomen showed extensive portal vein and SMV thrombosis with main pancreatic duct rupture and pancreatic fluid leak. Unable to visualize main pancreatic duct on ERCP due to duodenal edema GI planning for repeat ERCP next week. Continue IV fluids Continue pain management Dilaudid and oxycodone Continue clear liquids Continue Zofran GI to reorder CT abdomen and pelvis today Continue octreotide Patient is also on meropenem  #2 extensive SMV thrombosis and portal vein thrombosis on heparin drip  #3 scrotal edema-slowly improving with elevating both lower extremities overnight.  I also encouraged and discussed with the staff to rule initiate and put under the scrotum to support it.  #4 elevated LFTs likely secondary to #1 and #2 improving AST 25 from 31 ALT 69 from 93.  Total bilirubin is 1 from 1.5.  #5 hypokalemia being repleted K3.3    Nutrition Problem: Inadequate oral intake Etiology: inability to eat     Signs/Symptoms: NPO status    Interventions: Refer to RD note for recommendations  Estimated body mass index is 27.29 kg/m as calculated from the following:  Height as of this encounter: 5\' 9"  (1.753 m).   Weight as of this encounter: 83.8 kg.   Subjective: He is laying in bed with both of his legs very much elevated he feels the swelling has come down penile edema has come down.  Denies nausea vomiting he had a few loose stools he is passing gas abdomen remains bloated  Objective: Vitals:   12/07/19 0609 12/07/19 1348 12/07/19 2039 12/08/19 0608  BP: (!) 134/91 126/84 122/89 (!) 142/86  Pulse: 64 67 64 64  Resp: 16 17 17 17   Temp: 98.7 F (37.1 C) 98.2 F (36.8 C) 98.3 F (36.8 C) 98.2 F (36.8 C)  TempSrc: Oral Oral Oral Oral  SpO2: 96% 94% 92% 95%  Weight: 83.8 kg     Height:        Intake/Output Summary (Last 24 hours) at 12/08/2019 1207 Last data filed at 12/08/2019 1022 Gross per 24 hour  Intake 1100 ml  Output 1525 ml  Net -425 ml   Filed Weights   12/02/19 1933 12/07/19 0609  Weight: 74.4 kg 83.8 kg    Examination:  General exam: Appears calm and comfortable  Respiratory system: Clear to auscultation. Respiratory effort normal. Cardiovascular system: S1 & S2 heard, RRR. No JVD, murmurs, rubs, gallops or clicks. No pedal edema. Gastrointestinal system: Abdomen is distended, soft and nontender. No organomegaly or masses felt. Normal bowel sounds heard. Central nervous system: Alert and oriented. No focal neurological deficits. Extremities no edema scrotal edema present Skin: No rashes, lesions or ulcers Psychiatry: Judgement and insight appear normal. Mood & affect appropriate.     Data Reviewed: I have personally reviewed following  labs and imaging studies  CBC: Recent Labs  Lab 12/04/19 0528 12/05/19 0935 12/06/19 0451 12/07/19 1241 12/08/19 0505  WBC 14.7* 11.3* 7.4 6.8 5.7  HGB 17.6* 15.9 14.5 15.0 14.0  HCT 53.4* 47.8 43.1 46.1 42.8  MCV 98.7 98.2 96.0 100.0 97.5  PLT 221 229 200 244 254   Basic Metabolic Panel: Recent Labs  Lab 12/04/19 0528 12/05/19 0935 12/06/19 1238 12/07/19 1241  12/08/19 0505  NA 133* 133* 134* 137 137  K 4.4 3.6 3.2* 3.6 3.3*  CL 94* 92* 95* 95* 96*  CO2 26 28 27 31 30   GLUCOSE 121* 94 70 75 90  BUN 10 8 6  <5* <5*  CREATININE 0.47* 0.48* 0.38* 0.37* 0.42*  CALCIUM 8.6* 8.4* 8.1* 8.2* 8.1*  MG  --   --  1.6*  --   --    GFR: Estimated Creatinine Clearance: 124 mL/min (A) (by C-G formula based on SCr of 0.42 mg/dL (L)). Liver Function Tests: Recent Labs  Lab 12/04/19 0528 12/05/19 0935 12/06/19 1238 12/07/19 1241 12/08/19 0505  AST 265* 67* 30 31 25   ALT 384* 189* 105* 93* 69*  ALKPHOS 110 91 72 76 77  BILITOT 2.0* 2.7* 1.7* 1.5* 1.0  PROT 6.0* 5.9* 5.2* 5.6* 5.3*  ALBUMIN 3.1* 3.0* 2.5* 2.8* 2.5*   Recent Labs  Lab 12/02/19 2010 12/03/19 0503  LIPASE 435* 354*   No results for input(s): AMMONIA in the last 168 hours. Coagulation Profile: Recent Labs  Lab 12/05/19 0935  INR 1.3*   Cardiac Enzymes: No results for input(s): CKTOTAL, CKMB, CKMBINDEX, TROPONINI in the last 168 hours. BNP (last 3 results) No results for input(s): PROBNP in the last 8760 hours. HbA1C: No results for input(s): HGBA1C in the last 72 hours. CBG: No results for input(s): GLUCAP in the last 168 hours. Lipid Profile: No results for input(s): CHOL, HDL, LDLCALC, TRIG, CHOLHDL, LDLDIRECT in the last 72 hours. Thyroid Function Tests: No results for input(s): TSH, T4TOTAL, FREET4, T3FREE, THYROIDAB in the last 72 hours. Anemia Panel: No results for input(s): VITAMINB12, FOLATE, FERRITIN, TIBC, IRON, RETICCTPCT in the last 72 hours. Sepsis Labs: No results for input(s): PROCALCITON, LATICACIDVEN in the last 168 hours.  Recent Results (from the past 240 hour(s))  SARS CORONAVIRUS 2 (TAT 6-24 HRS) Nasopharyngeal Nasopharyngeal Swab     Status: None   Collection Time: 12/03/19 12:42 AM   Specimen: Nasopharyngeal Swab  Result Value Ref Range Status   SARS Coronavirus 2 NEGATIVE NEGATIVE Final    Comment: (NOTE) SARS-CoV-2 target nucleic acids are  NOT DETECTED. The SARS-CoV-2 RNA is generally detectable in upper and lower respiratory specimens during the acute phase of infection. Negative results do not preclude SARS-CoV-2 infection, do not rule out co-infections with other pathogens, and should not be used as the sole basis for treatment or other patient management decisions. Negative results must be combined with clinical observations, patient history, and epidemiological information. The expected result is Negative. Fact Sheet for Patients: HairSlick.nohttps://www.fda.gov/media/138098/download Fact Sheet for Healthcare Providers: quierodirigir.comhttps://www.fda.gov/media/138095/download This test is not yet approved or cleared by the [place] FDA and  has been authorized for detection and/or diagnosis of SARS-CoV-2 by FDA under an Emergency Use Authorization (EUA). This EUA will remain  in effect (meaning this test can be used) for the duration of the COVID-19 declaration under Section 56 4(b)(1) of the Act, 21 U.S.C. section 360bbb-3(b)(1), unless the authorization is terminated or revoked sooner. Performed at [facility], [address]  [place], [place] [address]          Radiology Studies: DG ERCP BILIARY & PANCREATIC DUCTS  Result Date: 12/06/2019 CLINICAL DATA:  38 year old male with a history of pancreatitis EXAM: ERCP TECHNIQUE: Multiple spot images obtained with the fluoroscopic device and submitted for interpretation post-procedure. FLUOROSCOPY TIME:  Fluoroscopy Time:  0 minutes 7 seconds COMPARISON:  MR 12/04/2019 FINDINGS: Limited intraoperative fluoroscopic spot images. Single image demonstrates the endoscope projecting over the upper abdomen. IMPRESSION: Limited images of ERCP with single image demonstrating the endoscope over the upper abdomen. Please refer to the dictated operative report for full details of intraoperative findings and procedure. Electronically Signed   By: Gilmer Mor D.O.   On: 12/06/2019 12:29         Scheduled Meds: . folic acid  1 mg Oral Daily  . indomethacin  100 mg Rectal Once  . multivitamin with minerals  1 tablet Oral Daily  . nicotine  7 mg Transdermal Daily  . octreotide  100 mcg Subcutaneous Q12H  . ondansetron (ZOFRAN) IV  4 mg Intravenous Q8H  . pantoprazole (PROTONIX) IV  40 mg Intravenous Q12H  . thiamine  100 mg Oral Daily   Continuous Infusions: . heparin 1,850 Units/hr (12/07/19 2357)  . lactated ringers 75 mL/hr at 12/08/19 0001  . meropenem (MERREM) IV 1 g (12/08/19 0422)  . potassium chloride 10 mEq (12/08/19 1129)     LOS: 5 days     Alwyn Ren, MD [facility]  If 7PM-7AM, please contact night-coverage [email or web address] Password [facility] 12/08/2019, 12:07 PM

## 2019-12-08 NOTE — Progress Notes (Signed)
ANTICOAGULATION CONSULT NOTE - Follow Up Consult  Pharmacy Consult for Heparin Indication: portal vein thrombosis   Allergies  Allergen Reactions  . Penicillins Rash    Has patient had a PCN reaction causing immediate rash, facial/tongue/throat swelling, SOB or lightheadedness with hypotension: yes Has patient had a PCN reaction causing severe rash involving mucus membranes or skin necrosis: Yes Has patient had a PCN reaction that required hospitalization: No Has patient had a PCN reaction occurring within the last 10 years: No If all of the above answers are "NO", then may proceed with Cephalosporin use.     Patient Measurements: Height: 5\' 9"  (175.3 cm) Weight: 184 lb 12.8 oz (83.8 kg) IBW/kg (Calculated) : 70.7 Heparin Dosing Weight:   Vital Signs: Temp: 98.3 F (36.8 C) (12/19 2039) Temp Source: Oral (12/19 2039) BP: 122/89 (12/19 2039) Pulse Rate: 64 (12/19 2039)  Labs: Recent Labs    12/05/19 0935 12/06/19 0451 12/06/19 1238 12/07/19 0744 12/07/19 1241 12/07/19 1547 12/07/19 2127  HGB 15.9 14.5  --   --  15.0  --   --   HCT 47.8 43.1  --   --  46.1  --   --   PLT 229 200  --   --  244  --   --   LABPROT 16.3*  --   --   --   --   --   --   INR 1.3*  --   --   --   --   --   --   HEPARINUNFRC  --   --   --  0.24*  --  0.41 0.39  CREATININE 0.48*  --  0.38*  --  0.37*  --   --     Estimated Creatinine Clearance: 124 mL/min (A) (by C-G formula based on SCr of 0.37 mg/dL (L)).   Medications:  Infusions:  . heparin 1,850 Units/hr (12/07/19 2357)  . lactated ringers 75 mL/hr at 12/08/19 0001  . meropenem (MERREM) IV 1 g (12/07/19 2239)    Assessment: Patient with 2nd heparin level at goal.  No heparin issues noted.  Goal of Therapy:  Heparin level 0.3-0.7 units/ml Monitor platelets by anticoagulation protocol: Yes   Plan:  Continue heparin drip at current rate Recheck level with [facility]  Tyler Deis, Shea Stakes [place] 12/08/2019,4:16 AM

## 2019-12-09 ENCOUNTER — Encounter: Payer: Self-pay | Admitting: *Deleted

## 2019-12-09 DIAGNOSIS — K859 Acute pancreatitis without necrosis or infection, unspecified: Secondary | ICD-10-CM

## 2019-12-09 LAB — COMPREHENSIVE METABOLIC PANEL
ALT: 57 U/L — ABNORMAL HIGH (ref 0–44)
AST: 23 U/L (ref 15–41)
Albumin: 2.5 g/dL — ABNORMAL LOW (ref 3.5–5.0)
Alkaline Phosphatase: 66 U/L (ref 38–126)
Anion gap: 12 (ref 5–15)
BUN: <5 mg/dL — ABNORMAL LOW (ref 6–20)
CO2: 28 mmol/L (ref 22–32)
Calcium: 8.1 mg/dL — ABNORMAL LOW (ref 8.9–10.3)
Chloride: 97 mmol/L — ABNORMAL LOW (ref 98–111)
Creatinine, Ser: 0.39 mg/dL — ABNORMAL LOW (ref 0.61–1.24)
GFR calc Af Amer: >60 mL/min (ref >60)
GFR calc non Af Amer: >60 mL/min (ref >60)
Glucose, Bld: 98 mg/dL (ref 70–99)
Potassium: 3.5 mmol/L (ref 3.5–5.1)
Sodium: 137 mmol/L (ref 135–145)
Total Bilirubin: 1.3 mg/dL — ABNORMAL HIGH (ref 0.3–1.2)
Total Protein: 4.9 g/dL — ABNORMAL LOW (ref 6.5–8.1)

## 2019-12-09 LAB — CBC
HCT: 41.7 % (ref 39.0–52.0)
Hemoglobin: 13.7 g/dL (ref 13.0–17.0)
MCH: 31.9 pg (ref 26.0–34.0)
MCHC: 32.9 g/dL (ref 30.0–36.0)
MCV: 97.2 fL (ref 80.0–100.0)
Platelets: 238 10*3/uL (ref 150–400)
RBC: 4.29 MIL/uL (ref 4.22–5.81)
RDW: 13.5 % (ref 11.5–15.5)
WBC: 4.3 10*3/uL (ref 4.0–10.5)
nRBC: 0 % (ref 0.0–0.2)

## 2019-12-09 LAB — MAGNESIUM: Magnesium: 1.6 mg/dL — ABNORMAL LOW (ref 1.7–2.4)

## 2019-12-09 LAB — HEPARIN LEVEL (UNFRACTIONATED)
Heparin Unfractionated: 0.33 IU/mL (ref 0.30–0.70)
Heparin Unfractionated: 0.47 IU/mL (ref 0.30–0.70)

## 2019-12-09 MED ORDER — OCTREOTIDE ACETATE 100 MCG/ML IJ SOLN
100.0000 ug | Freq: Three times a day (TID) | INTRAMUSCULAR | Status: AC
  Administered 2019-12-09 – 2019-12-14 (×15): 100 ug via SUBCUTANEOUS
  Filled 2019-12-09 (×16): qty 1

## 2019-12-09 NOTE — Progress Notes (Addendum)
PROGRESS NOTE    Paul Allen  JYN:[id number] DOB: Nov 13, 1981 DOA: 12/02/2019 PCP: Patient, No Pcp Per    Brief Narrative: 38 year old Caucasian male with history of alcohol abuse admitted with acute alcoholic pancreatitis complicated with pancreatic duct rupture and extensive portal vein and SMV thrombosis  Assessment & Plan:   Active Problems:   Pancreatitis, acute   Elevated LFTs   Elevated hemoglobin (HCC)   Pancreatic ascites   Abdominal pain, epigastric   Portal vein thrombosis   Bile leak   Choledocholithiasis   #1 acute necrotizing pancreatitis with main pancreatic duct rupture/ SMV thrombosis and portal vein thrombosis likely secondary to alcohol abuse. CT 12/08/2019-Significant interval increase in abdominal ascites and peripancreatic fluid collections.  Persistent nonocclusive thrombosis of the main portal vein with occlusive to near occlusive thrombus within the left portal vein. There is likely branch portal vein thrombosis involving the posterior divisions of the right hepatic lobe. There is persistent thrombus extending into the SMV. The splenic vein remains patent.Persistent heterogeneous area involving hepatic segment 7/6, concerning for hepatic necrosis as seen on prior MRI.  Again noted are findings of pancreatitis with pancreatic necrosis of the pancreatic neck.  Worsening body wall edema.  Small bilateral pleural effusions, left greater than right.  Persistent wall thickening of the hepatic flexure of the colon felt to be reactive in etiology. MRI abdomen showed extensive portal vein and SMV thrombosis with main pancreatic duct rupture and pancreatic fluid leak. Unable to visualize main pancreatic duct on ERCP due to duodenal edema  GI planning for repeat ERCP next week. Continue IV fluids Continue pain management Dilaudid and oxycodone Continue clear liquids Continue Zofran Continue octreotide and meropenem  #2 extensive SMV thrombosis and  portal vein thrombosis on heparin drip  #3 scrotal edema-slowly improving with elevating both lower extremities overnight.  I also encouraged and discussed with the staff to rule initiate and put under the scrotum to support it.  #4 elevated LFTs likely secondary to #1 and #2 improving AST 23 from 25 from 31 ALT 57 from a 69 from 93.  Total bilirubin is 1.3.  #5 hypokalemia repleted potassium 3.5.  Check magnesium.  #6 QT prolongation QT 450 on Zofran standing dose will recheck EKG in a.m.     Nutrition Problem: Inadequate oral intake Etiology: inability to eat     Signs/Symptoms: NPO status    Interventions: Refer to RD note for recommendations  Estimated body mass index is 27.29 kg/m as calculated from the following:   Height as of this encounter: 5\' 9"  (1.753 m).   Weight as of this encounter: 83.8 kg.  DVT prophylaxis: Heparin drip  code Status: Full code Family Communication: None Disposition Plan: Pending clinical improvement  Consultants:   GI and general surgery  Procedures: ERCP Antimicrobials: Meropenem  Subjective: Resting in bed when I saw him earlier today later on he was ambulating in the hallway had few loose bowel movements has nausea but no vomiting  Objective: Vitals:   12/08/19 0608 12/08/19 1348 12/08/19 2037 12/09/19 0611  BP: (!) 142/86 (!) 138/93 131/84 126/82  Pulse: 64 (!) 58 (!) 58 60  Resp: 17 18 18 17   Temp: 98.2 F (36.8 C) 97.9 F (36.6 C) 97.7 F (36.5 C) 98.2 F (36.8 C)  TempSrc: Oral Oral Oral Oral  SpO2: 95% 96% 97% 95%  Weight:      Height:        Intake/Output Summary (Last 24 hours) at 12/09/2019 1208 Last data filed  at 12/09/2019 0541 Gross per 24 hour  Intake 5020.06 ml  Output 3125 ml  Net 1895.06 ml   Filed Weights   12/02/19 1933 12/07/19 0609  Weight: 74.4 kg 83.8 kg    Examination:  General exam: Appears calm and comfortable  Respiratory system: Clear to auscultation. Respiratory effort  normal. Cardiovascular system: S1 & S2 heard, RRR. No JVD, murmurs, rubs, gallops or clicks. No pedal edema. Gastrointestinal system: Abdomen is distended, soft and nontender. No organomegaly or masses felt. Normal bowel sounds heard. Central nervous system: Alert and oriented. No focal neurological deficits. Extremities: Symmetric 5 x 5 power. Skin: No rashes, lesions or ulcers Psychiatry: Judgement and insight appear normal. Mood & affect appropriate.     Data Reviewed: I have personally reviewed following labs and imaging studies  CBC: Recent Labs  Lab 12/05/19 0935 12/06/19 0451 12/07/19 1241 12/08/19 0505 12/09/19 0453  WBC 11.3* 7.4 6.8 5.7 4.3  HGB 15.9 14.5 15.0 14.0 13.7  HCT 47.8 43.1 46.1 42.8 41.7  MCV 98.2 96.0 100.0 97.5 97.2  PLT 229 200 244 254 238   Basic Metabolic Panel: Recent Labs  Lab 12/05/19 0935 12/06/19 1238 12/07/19 1241 12/08/19 0505 12/09/19 0453  NA 133* 134* 137 137 137  K 3.6 3.2* 3.6 3.3* 3.5  CL 92* 95* 95* 96* 97*  CO2 28 27 31 30 28   GLUCOSE 94 70 75 90 98  BUN 8 6 <5* <5* <5*  CREATININE 0.48* 0.38* 0.37* 0.42* 0.39*  CALCIUM 8.4* 8.1* 8.2* 8.1* 8.1*  MG  --  1.6*  --   --   --    GFR: Estimated Creatinine Clearance: 124 mL/min (A) (by C-G formula based on SCr of 0.39 mg/dL (L)). Liver Function Tests: Recent Labs  Lab 12/05/19 0935 12/06/19 1238 12/07/19 1241 12/08/19 0505 12/09/19 0453  AST 67* 30 31 25 23   ALT 189* 105* 93* 69* 57*  ALKPHOS 91 72 76 77 66  BILITOT 2.7* 1.7* 1.5* 1.0 1.3*  PROT 5.9* 5.2* 5.6* 5.3* 4.9*  ALBUMIN 3.0* 2.5* 2.8* 2.5* 2.5*   Recent Labs  Lab 12/02/19 2010 12/03/19 0503  LIPASE 435* 354*   No results for input(s): AMMONIA in the last 168 hours. Coagulation Profile: Recent Labs  Lab 12/05/19 0935  INR 1.3*   Cardiac Enzymes: No results for input(s): CKTOTAL, CKMB, CKMBINDEX, TROPONINI in the last 168 hours. BNP (last 3 results) No results for input(s): PROBNP in the last 8760  hours. HbA1C: No results for input(s): HGBA1C in the last 72 hours. CBG: No results for input(s): GLUCAP in the last 168 hours. Lipid Profile: No results for input(s): CHOL, HDL, LDLCALC, TRIG, CHOLHDL, LDLDIRECT in the last 72 hours. Thyroid Function Tests: No results for input(s): TSH, T4TOTAL, FREET4, T3FREE, THYROIDAB in the last 72 hours. Anemia Panel: No results for input(s): VITAMINB12, FOLATE, FERRITIN, TIBC, IRON, RETICCTPCT in the last 72 hours. Sepsis Labs: No results for input(s): PROCALCITON, LATICACIDVEN in the last 168 hours.  Recent Results (from the past 240 hour(s))  SARS CORONAVIRUS 2 (TAT 6-24 HRS) Nasopharyngeal Nasopharyngeal Swab     Status: None   Collection Time: 12/03/19 12:42 AM   Specimen: Nasopharyngeal Swab  Result Value Ref Range Status   SARS Coronavirus 2 NEGATIVE NEGATIVE Final    Comment: (NOTE) SARS-CoV-2 target nucleic acids are NOT DETECTED. The SARS-CoV-2 RNA is generally detectable in upper and lower respiratory specimens during the acute phase of infection. Negative results do not preclude SARS-CoV-2 infection, do not  rule out co-infections with other pathogens, and should not be used as the sole basis for treatment or other patient management decisions. Negative results must be combined with clinical observations, patient history, and epidemiological information. The expected result is Negative. Fact Sheet for Patients: HairSlick.no Fact Sheet for Healthcare Providers: quierodirigir.com This test is not yet approved or cleared by the [place] FDA and  has been authorized for detection and/or diagnosis of SARS-CoV-2 by FDA under an Emergency Use Authorization (EUA). This EUA will remain  in effect (meaning this test can be used) for the duration of the COVID-19 declaration under Section 56 4(b)(1) of the Act, 21 U.S.C. section 360bbb-3(b)(1), unless the authorization is terminated  or revoked sooner. Performed at [facility], [address] [place], [place] [address]          Radiology Studies: CT ABDOMEN PELVIS W CONTRAST  Result Date: 12/08/2019 CLINICAL DATA:  Pancreatitis EXAM: CT ABDOMEN AND PELVIS WITH CONTRAST TECHNIQUE: Multidetector CT imaging of the abdomen and pelvis was performed using the standard protocol following bolus administration of intravenous contrast. CONTRAST:  OMNIPAQUE IOHEXOL 300 MG/ML  SOLN COMPARISON:  December 2 second 2020 FINDINGS: Lower chest: There are small bilateral pleural effusions, left greater than right. There is atelectasis at the lung bases.The heart size is normal. Hepatobiliary: There is periportal edema. There is intrahepatic biliary ductal dilatation. There is a heterogeneous hypoattenuating region in hepatic segment 6/7 which is new from prior CT. The portal vein supplying this region of the liver appears to be occluded. The gallbladder is unremarkable. Pancreas: Again noted are findings of subacute pancreatitis with pancreatic necrosis involving the pancreatic neck. There are increasing peripancreatic fluid collections. Spleen: The spleen is borderline enlarged. Adrenals/Urinary Tract: --Adrenal glands: No adrenal hemorrhage. --Right kidney/ureter: No hydronephrosis or perinephric hematoma. --Left kidney/ureter: No hydronephrosis or perinephric hematoma. --Urinary bladder: Unremarkable. Stomach/Bowel: --Stomach/Duodenum: No hiatal hernia or other gastric abnormality. Normal duodenal course and caliber. There is a large well-formed fluid collection measuring approximately 6.5 by 7.1 cm abutting the gastric body. --Small bowel: No dilatation or inflammation. --Colon: Rectosigmoid diverticulosis without acute inflammation. There is diffuse wall thickening of the hepatic flexure of the colon which is felt to be reactive in etiology. --Appendix: Not visualized. No right lower quadrant inflammation or free fluid.  Vascular/Lymphatic: Atherosclerotic calcification is present within the non-aneurysmal abdominal aorta, without hemodynamically significant stenosis. Thrombus is noted extending into the main portal vein with likely near complete occlusion of the left portal vein. There is branch occlusion involving the posterior branches of the right portal vein. There is thrombus extending into the SMV. The splenic vein remains patent. The splenic artery remains patent. --there are some mildly enlarged reactive lymph nodes in the retroperitoneal space. --No mesenteric lymphadenopathy. --No pelvic or inguinal lymphadenopathy. Reproductive: Unremarkable Other: There is a large volume of abdominal ascites which has significantly increased from prior study. There is mesenteric edema and haziness of the omentum. There is new diffuse body wall edema extending into the patient's scrotum. Musculoskeletal. No acute displaced fractures. IMPRESSION: 1. Significant interval increase in abdominal ascites and peripancreatic fluid collections. 2. Persistent nonocclusive thrombosis of the main portal vein with occlusive to near occlusive thrombus within the left portal vein. There is likely branch portal vein thrombosis involving the posterior divisions of the right hepatic lobe. There is persistent thrombus extending into the SMV. The splenic vein remains patent. 3. Persistent heterogeneous area involving hepatic segment 7/6, concerning for hepatic necrosis as seen on  prior MRI. 4. Again noted are findings of pancreatitis with pancreatic necrosis of the pancreatic neck. 5. Worsening body wall edema. 6. Small bilateral pleural effusions, left greater than right. 7. Persistent wall thickening of the hepatic flexure of the colon felt to be reactive in etiology. Aortic Atherosclerosis (ICD10-I70.0). Electronically Signed   By: Katherine Mantle M.D.   On: 12/08/2019 19:18        Scheduled Meds: . folic acid  1 mg Oral Daily  . indomethacin   100 mg Rectal Once  . multivitamin with minerals  1 tablet Oral Daily  . nicotine  7 mg Transdermal Daily  . octreotide  100 mcg Subcutaneous Q12H  . ondansetron (ZOFRAN) IV  4 mg Intravenous Q8H  . pantoprazole (PROTONIX) IV  40 mg Intravenous Q12H  . thiamine  100 mg Oral Daily   Continuous Infusions: . heparin 1,850 Units/hr (12/09/19 0247)  . lactated ringers 75 mL/hr at 12/09/19 1158  . meropenem (MERREM) IV 1 g (12/09/19 1156)     LOS: 6 days     Alwyn Ren, MD [facility]   If 7PM-7AM, please contact night-coverage [email or web address] Password [facility] 12/09/2019, 12:08 PM

## 2019-12-09 NOTE — Progress Notes (Addendum)
ANTICOAGULATION CONSULT NOTE - Follow Up Consult  Pharmacy Consult for Heparin Indication: portal vein thrombosis   Allergies  Allergen Reactions  . Penicillins Rash    Has patient had a PCN reaction causing immediate rash, facial/tongue/throat swelling, SOB or lightheadedness with hypotension: yes Has patient had a PCN reaction causing severe rash involving mucus membranes or skin necrosis: Yes Has patient had a PCN reaction that required hospitalization: No Has patient had a PCN reaction occurring within the last 10 years: No If all of the above answers are "NO", then may proceed with Cephalosporin use.     Patient Measurements: Height: 5\' 9"  (175.3 cm) Weight: 184 lb 12.8 oz (83.8 kg) IBW/kg (Calculated) : 70.7 Heparin Dosing Weight:   Vital Signs: Temp: 98.2 F (36.8 C) (12/21 0611) Temp Source: Oral (12/21 0611) BP: 126/82 (12/21 0611) Pulse Rate: 60 (12/21 0611)  Labs: Recent Labs    12/07/19 1241 12/07/19 2127 12/08/19 0505 12/09/19 0453  HGB 15.0  --  14.0 13.7  HCT 46.1  --  42.8 41.7  PLT 244  --  254 238  HEPARINUNFRC  --  0.39 0.44 0.33  CREATININE 0.37*  --  0.42* 0.39*    Estimated Creatinine Clearance: 124 mL/min (A) (by C-G formula based on SCr of 0.39 mg/dL (L)).   Medications:  Infusions:  . heparin 1,850 Units/hr (12/09/19 0247)  . lactated ringers 1,000 mL (12/08/19 1424)  . meropenem (MERREM) IV 1 g (12/09/19 0410)    Assessment: Pharmacy is consulted to dose heparin in 38 yo male diagnosed with portal vein thrombosis. CT done on 12/17 shows "venous thrombosis which is extensive in the SMV, main portal, left portal and right portal. There is still some patency of main portal vein and right portal branches."    Today, 12/09/19  HL is 0.33, therapeutic  CBC stable  No line or bleeding issues per RN   Goal of Therapy:  Heparin level 0.3-0.7 units/ml Monitor platelets by anticoagulation protocol: Yes   Plan:   Continue current  rate of heparin 1850 units/hr  Daily CBC while on heparin drip  Watch for when ERCP is scheduled and need to stop heparin   Monitor for signs and symptoms of bleeding  Royetta Asal, PharmD, BCPS 12/09/2019 10:39 AM     ADDENDUM   Follow up HL is 0.47 which is also therapeutic  Continue at above dose  DHL with AM labs    Royetta Asal, PharmD, BCPS 12/09/2019 2:30 PM

## 2019-12-09 NOTE — Progress Notes (Signed)
Pharmacy Antibiotic Note  Paul Allen is a 38 y.o. male admitted on 12/02/2019 with bile duct leak.  Pharmacy has been consulted for meropenem dosing.  Plan:  Meropenem 1 gr IV q8h    Monitor clinical course, renal function, cultures as available   Height: 5\' 9"  (175.3 cm) Weight: 184 lb 12.8 oz (83.8 kg) IBW/kg (Calculated) : 70.7  Temp (24hrs), Avg:98 F (36.7 C), Min:97.7 F (36.5 C), Max:98.2 F (36.8 C)  Recent Labs  Lab 12/05/19 0935 12/06/19 0451 12/06/19 1238 12/07/19 1241 12/08/19 0505 12/09/19 0453  WBC 11.3* 7.4  --  6.8 5.7 4.3  CREATININE 0.48*  --  0.38* 0.37* 0.42* 0.39*    Estimated Creatinine Clearance: 124 mL/min (A) (by C-G formula based on SCr of 0.39 mg/dL (L)).    Allergies  Allergen Reactions  . Penicillins Rash    Has patient had a PCN reaction causing immediate rash, facial/tongue/throat swelling, SOB or lightheadedness with hypotension: yes Has patient had a PCN reaction causing severe rash involving mucus membranes or skin necrosis: Yes Has patient had a PCN reaction that required hospitalization: No Has patient had a PCN reaction occurring within the last 10 years: No If all of the above answers are "NO", then may proceed with Cephalosporin use.     Antimicrobials this admission: 12/17 meropenem >>   Dose adjustments this admission:   Microbiology results: 12/15 COVID-19: Negative  12/15 HIV antibody: negative   Thank you for allowing pharmacy to be a part of this patient's care.   Royetta Asal, PharmD, BCPS 12/09/2019 2:31 PM

## 2019-12-09 NOTE — Final Consult Note (Signed)
Consultant Final Sign-Off Note    Assessment/Final recommendations  Paul Allen is a 38 y.o. 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, main portal, left portal and right portal thrombosis  Necrotizing pancreatitis with pancreatic ductal disruption  - Would not recommend drainage of the peripancreatic fluid collections at this time - Surgical intervention during this admission would be an extremely morbid procedure and extremely high risk for significant serious complications as well as potential death during this hospitalization - will defer whether or not the candidate is appropriate for oral intake to GI service - GI to plan another attempt at pancreatic stent placement this week - Patient may ultimately need to be [facility] - will sign off at this time. Please page us with any further questions or concerns for this patient   Wound care (if applicable):    Diet at discharge: per GI   Activity at discharge: per GI   Follow-up appointment:  None needed at this time   Pending results:  Unresulted Labs (From admission, onward)    Start     Ordered   12/08/19 0500  CBC  Daily,   R    Question:  Specimen collection method  Answer:  Lab=Lab collect   12/07/19 0826   12/08/19 0500  Heparin level (unfractionated)  Daily,   R    Question:  Specimen collection method  Answer:  Lab=Lab collect   12/07/19 0826   12/08/19 0500  Comprehensive metabolic panel Once  Daily,   R    Question:  Specimen collection method  Answer:  Lab=Lab collect   12/07/19 1203   12/08/19 0500  Heparin level (unfractionated)  Daily,   STAT    Question:  Specimen collection method  Answer:  Lab=Lab collect   12/08/19 0417           Medication recommendations:   Other recommendations:    Thank you for allowing us to participate in the care of your patient!  Please consult us again if you have further needs for your  patient.  Paul SimonJessica L Raffaela Ladley 12/09/2019 8:55 AM    Subjective   CC: abdominal pain  Pain well controlled. Patient is concerned about LE and scrotal swelling. No redness, pain or increased warmth to scrotum per patient. Nausea this am without emesis. Tolerating clears. Having flatus and BM's. Informed pt that if he has any additional concerns about his scrotum to inform the medicine team. I discussed no rule for surgical intervention at this time. Pt expressed understanding.   Objective  Vital signs in last 24 hours: Temp:  [97.7 F (36.5 C)-98.2 F (36.8 C)] 98.2 F (36.8 C) (12/21 0611) Pulse Rate:  [58-60] 60 (12/21 0611) Resp:  [17-18] 17 (12/21 0611) BP: (126-138)/(82-93) 126/82 (12/21 0611) SpO2:  [95 %-97 %] 95 % (12/21 81190611)  PE: General: well appearing, NAD, pleasant, cooperative Lungs; rate and effort normal GI: no distention, soft, hepatomegaly, mild generalized TTP without guarding, no peritonitis Extremities: No edema to BLE below knees, 2+ DP pulses BL  Pertinent labs and Studies: Recent Labs    12/07/19 1241 12/08/19 0505 12/09/19 0453  WBC 6.8 5.7 4.3  HGB 15.0 14.0 13.7  HCT 46.1 42.8 41.7   BMET Recent Labs    12/08/19 0505 12/09/19 0453  NA 137 137  K 3.3* 3.5  CL 96* 97*  CO2 30 28  GLUCOSE 90 98  BUN <5* <5*  CREATININE  0.42* 0.39*  CALCIUM 8.1* 8.1*   No results for input(s): LABURIN in the last 72 hours. Results for orders placed or performed during the hospital encounter of 12/02/19  SARS CORONAVIRUS 2 (TAT 6-24 HRS) Nasopharyngeal Nasopharyngeal Swab     Status: None   Collection Time: 12/03/19 12:42 AM   Specimen: Nasopharyngeal Swab  Result Value Ref Range Status   SARS Coronavirus 2 NEGATIVE NEGATIVE Final    Comment: (NOTE) SARS-CoV-2 target nucleic acids are NOT DETECTED. The SARS-CoV-2 RNA is generally detectable in upper and lower respiratory specimens during the acute phase of infection. Negative results do not preclude  SARS-CoV-2 infection, do not rule out co-infections with other pathogens, and should not be used as the sole basis for treatment or other patient management decisions. Negative results must be combined with clinical observations, patient history, and epidemiological information. The expected result is Negative. Fact Sheet for Patients: SugarRoll.be Fact Sheet for Healthcare Providers: https://www.woods-mathews.com/ This test is not yet approved or cleared by the [place] FDA and  has been authorized for detection and/or diagnosis of SARS-CoV-2 by FDA under an Emergency Use Authorization (EUA). This EUA will remain  in effect (meaning this test can be used) for the duration of the COVID-19 declaration under Section 56 4(b)(1) of the Act, 21 U.S.C. section 360bbb-3(b)(1), unless the authorization is terminated or revoked sooner. Performed at [facility], [place] [address], [place], [place] [address]     Imaging: CT ABDOMEN PELVIS W CONTRAST  Result Date: 12/08/2019 CLINICAL DATA:  Pancreatitis EXAM: CT ABDOMEN AND PELVIS WITH CONTRAST TECHNIQUE: Multidetector CT imaging of the abdomen and pelvis was performed using the standard protocol following bolus administration of intravenous contrast. CONTRAST:  139mL OMNIPAQUE IOHEXOL 300 MG/ML  SOLN COMPARISON:  December 2 second 2020 FINDINGS: Lower chest: There are small bilateral pleural effusions, left greater than right. There is atelectasis at the lung bases.The heart size is normal. Hepatobiliary: There is periportal edema. There is intrahepatic biliary ductal dilatation. There is a heterogeneous hypoattenuating region in hepatic segment 6/7 which is new from prior CT. The portal vein supplying this region of the liver appears to be occluded. The gallbladder is unremarkable. Pancreas: Again noted are findings of subacute pancreatitis with pancreatic necrosis involving the pancreatic neck. There  are increasing peripancreatic fluid collections. Spleen: The spleen is borderline enlarged. Adrenals/Urinary Tract: --Adrenal glands: No adrenal hemorrhage. --Right kidney/ureter: No hydronephrosis or perinephric hematoma. --Left kidney/ureter: No hydronephrosis or perinephric hematoma. --Urinary bladder: Unremarkable. Stomach/Bowel: --Stomach/Duodenum: No hiatal hernia or other gastric abnormality. Normal duodenal course and caliber. There is a large well-formed fluid collection measuring approximately 6.5 by 7.1 cm abutting the gastric body. --Small bowel: No dilatation or inflammation. --Colon: Rectosigmoid diverticulosis without acute inflammation. There is diffuse wall thickening of the hepatic flexure of the colon which is felt to be reactive in etiology. --Appendix: Not visualized. No right lower quadrant inflammation or free fluid. Vascular/Lymphatic: Atherosclerotic calcification is present within the non-aneurysmal abdominal aorta, without hemodynamically significant stenosis. Thrombus is noted extending into the main portal vein with likely near complete occlusion of the left portal vein. There is branch occlusion involving the posterior branches of the right portal vein. There is thrombus extending into the SMV. The splenic vein remains patent. The splenic artery remains patent. --there are some mildly enlarged reactive lymph nodes in the retroperitoneal space. --No mesenteric lymphadenopathy. --No pelvic or inguinal lymphadenopathy. Reproductive: Unremarkable Other: There is a large volume of abdominal ascites which has significantly increased  from prior study. There is mesenteric edema and haziness of the omentum. There is new diffuse body wall edema extending into the patient's scrotum. Musculoskeletal. No acute displaced fractures. IMPRESSION: 1. Significant interval increase in abdominal ascites and peripancreatic fluid collections. 2. Persistent nonocclusive thrombosis of the main portal vein with  occlusive to near occlusive thrombus within the left portal vein. There is likely branch portal vein thrombosis involving the posterior divisions of the right hepatic lobe. There is persistent thrombus extending into the SMV. The splenic vein remains patent. 3. Persistent heterogeneous area involving hepatic segment 7/6, concerning for hepatic necrosis as seen on prior MRI. 4. Again noted are findings of pancreatitis with pancreatic necrosis of the pancreatic neck. 5. Worsening body wall edema. 6. Small bilateral pleural effusions, left greater than right. 7. Persistent wall thickening of the hepatic flexure of the colon felt to be reactive in etiology. Aortic Atherosclerosis (ICD10-I70.0). Electronically Signed   By: Katherine Mantle M.D.   On: 12/08/2019 19:18

## 2019-12-09 NOTE — Progress Notes (Addendum)
Progress Note    ASSESSMENT AND PLAN:   Recurrent acute (necrotizing) pancreatitis with PD disruption, multiple fluid collections. Initially had abnormal liver tests as well.  Hx of Etoh use, none in last month. Imaging >> gallbladder sludge and ascites.  ERCP attempted this admission but unable to identify major papilla, see below.  -If condition worsens may need percutaneous drainage. Will discuss with our advanced biliary endoscopist Dr. Meridee Score.     SUBJECTIVE    Some abdominal discomfort.    OBJECTIVE:     ERCP 12/06/19 Moderate, friable gastritis with a suggestion of portal gastropathy changes. No clear esophageal or gastric varices. - Significant edema causing liminal narrowing and friable mucosa from the duodenal bulb until 3rd duodenum segment. Becuase of these changes I could not identify the major papilla.  Vital signs in last 24 hours: Temp:  [97.7 F (36.5 C)-98.2 F (36.8 C)] 98.2 F (36.8 C) (12/21 0611) Pulse Rate:  [58-60] 60 (12/21 0611) Resp:  [17-18] 17 (12/21 0611) BP: (126-138)/(82-93) 126/82 (12/21 0611) SpO2:  [95 %-97 %] 95 % (12/21 0611) Last BM Date: 12/08/19 General:   in NAD EENT:  Normal hearing, non icteric sclera   Heart:  Regular rate and rhythm;  No lower extremity edema   Pulm: Normal respiratory effort   Abdomen:  Soft, distended, hypoactive BS.          Neurologic:  Alert and  oriented x4;  grossly normal neurologically. Psych:  Pleasant, cooperative.  Normal mood and affect.   Intake/Output from previous day: 12/20 0701 - 12/21 0700 In: 6000.1 [P.O.:1440; I.V.:4060.1; IV Piggyback:500] Out: 3925 [Urine:3925] Intake/Output this shift: No intake/output data recorded.  Lab Results: Recent Labs    12/07/19 1241 12/08/19 0505 12/09/19 0453  WBC 6.8 5.7 4.3  HGB 15.0 14.0 13.7  HCT 46.1 42.8 41.7  PLT 244 254 238   BMET Recent Labs    12/07/19 1241 12/08/19 0505 12/09/19 0453  NA 137 137 137  K 3.6 3.3* 3.5    CL 95* 96* 97*  CO2 31 30 28   GLUCOSE 75 90 98  BUN <5* <5* <5*  CREATININE 0.37* 0.42* 0.39*  CALCIUM 8.2* 8.1* 8.1*   LFT Recent Labs    12/09/19 0453  PROT 4.9*  ALBUMIN 2.5*  AST 23  ALT 57*  ALKPHOS 66  BILITOT 1.3*   PT/INR No results for input(s): LABPROT, INR in the last 72 hours. Hepatitis Panel No results for input(s): HEPBSAG, HCVAB, HEPAIGM, HEPBIGM in the last 72 hours.  CT ABDOMEN PELVIS W CONTRAST  Result Date: 12/08/2019 CLINICAL DATA:  Pancreatitis EXAM: CT ABDOMEN AND PELVIS WITH CONTRAST TECHNIQUE: Multidetector CT imaging of the abdomen and pelvis was performed using the standard protocol following bolus administration of intravenous contrast. CONTRAST:  12/10/2019 OMNIPAQUE IOHEXOL 300 MG/ML  SOLN COMPARISON:  December 2 second 2020 FINDINGS: Lower chest: There are small bilateral pleural effusions, left greater than right. There is atelectasis at the lung bases.The heart size is normal. Hepatobiliary: There is periportal edema. There is intrahepatic biliary ductal dilatation. There is a heterogeneous hypoattenuating region in hepatic segment 6/7 which is new from prior CT. The portal vein supplying this region of the liver appears to be occluded. The gallbladder is unremarkable. Pancreas: Again noted are findings of subacute pancreatitis with pancreatic necrosis involving the pancreatic neck. There are increasing peripancreatic fluid collections. Spleen: The spleen is borderline enlarged. Adrenals/Urinary Tract: --Adrenal glands: No adrenal hemorrhage. --Right kidney/ureter: No hydronephrosis or perinephric  hematoma. --Left kidney/ureter: No hydronephrosis or perinephric hematoma. --Urinary bladder: Unremarkable. Stomach/Bowel: --Stomach/Duodenum: No hiatal hernia or other gastric abnormality. Normal duodenal course and caliber. There is a large well-formed fluid collection measuring approximately 6.5 by 7.1 cm abutting the gastric body. --Small bowel: No dilatation or  inflammation. --Colon: Rectosigmoid diverticulosis without acute inflammation. There is diffuse wall thickening of the hepatic flexure of the colon which is felt to be reactive in etiology. --Appendix: Not visualized. No right lower quadrant inflammation or free fluid. Vascular/Lymphatic: Atherosclerotic calcification is present within the non-aneurysmal abdominal aorta, without hemodynamically significant stenosis. Thrombus is noted extending into the main portal vein with likely near complete occlusion of the left portal vein. There is branch occlusion involving the posterior branches of the right portal vein. There is thrombus extending into the SMV. The splenic vein remains patent. The splenic artery remains patent. --there are some mildly enlarged reactive lymph nodes in the retroperitoneal space. --No mesenteric lymphadenopathy. --No pelvic or inguinal lymphadenopathy. Reproductive: Unremarkable Other: There is a large volume of abdominal ascites which has significantly increased from prior study. There is mesenteric edema and haziness of the omentum. There is new diffuse body wall edema extending into the patient's scrotum. Musculoskeletal. No acute displaced fractures. IMPRESSION: 1. Significant interval increase in abdominal ascites and peripancreatic fluid collections. 2. Persistent nonocclusive thrombosis of the main portal vein with occlusive to near occlusive thrombus within the left portal vein. There is likely branch portal vein thrombosis involving the posterior divisions of the right hepatic lobe. There is persistent thrombus extending into the SMV. The splenic vein remains patent. 3. Persistent heterogeneous area involving hepatic segment 7/6, concerning for hepatic necrosis as seen on prior MRI. 4. Again noted are findings of pancreatitis with pancreatic necrosis of the pancreatic neck. 5. Worsening body wall edema. 6. Small bilateral pleural effusions, left greater than right. 7. Persistent wall  thickening of the hepatic flexure of the colon felt to be reactive in etiology. Aortic Atherosclerosis (ICD10-I70.0). Electronically Signed   By: Constance Holster M.D.   On: 12/08/2019 19:18      Active Problems:   Pancreatitis, acute   Elevated LFTs   Elevated hemoglobin (HCC)   Pancreatic ascites   Abdominal pain, epigastric   Portal vein thrombosis   Bile leak   Choledocholithiasis     LOS: 6 days   Tye Savoy ,NP 12/09/2019, 9:25 AM   .  Attending physician's note   I have taken an interval history, reviewed the chart and examined the patient. I agree with the Advanced Practitioner's note, impression and recommendations.   CT done last night reviewed.  Acute severe recurrent necrotizing pancreatitis (d/t ETOH) with significant increase in fluid collections. PD disruption. SMV/PV thrombosis on heparin. Multisubstance abuse.  Plan: -D/W Dr. Ardis Hughs and Dr. Rush Landmark.  Plan is to attempt another ERCP on Wednesday 12/23 with possible PD stenting.  Hold heparin 6 hours before. -Continue supportive treatment for now. -Continue meropenem, increase octreotide, pain control, nausea control. -Ambulate -Clear liquid diet.  Will advance gradually as tolerated.  NPO after MN 12/10/2019.  Carmell Austria, MD [facility]uns [phone].

## 2019-12-10 DIAGNOSIS — R188 Other ascites: Secondary | ICD-10-CM

## 2019-12-10 LAB — COMPREHENSIVE METABOLIC PANEL
ALT: 52 U/L — ABNORMAL HIGH (ref 0–44)
AST: 24 U/L (ref 15–41)
Albumin: 2.8 g/dL — ABNORMAL LOW (ref 3.5–5.0)
Alkaline Phosphatase: 73 U/L (ref 38–126)
Anion gap: 15 (ref 5–15)
BUN: <5 mg/dL — ABNORMAL LOW (ref 6–20)
CO2: 27 mmol/L (ref 22–32)
Calcium: 8.5 mg/dL — ABNORMAL LOW (ref 8.9–10.3)
Chloride: 94 mmol/L — ABNORMAL LOW (ref 98–111)
Creatinine, Ser: 0.59 mg/dL — ABNORMAL LOW (ref 0.61–1.24)
GFR calc Af Amer: >60 mL/min (ref >60)
GFR calc non Af Amer: >60 mL/min (ref >60)
Glucose, Bld: 98 mg/dL (ref 70–99)
Potassium: 3.6 mmol/L (ref 3.5–5.1)
Sodium: 136 mmol/L (ref 135–145)
Total Bilirubin: 1.2 mg/dL (ref 0.3–1.2)
Total Protein: 5.7 g/dL — ABNORMAL LOW (ref 6.5–8.1)

## 2019-12-10 LAB — HEPARIN LEVEL (UNFRACTIONATED)
Heparin Unfractionated: 0.28 IU/mL — ABNORMAL LOW (ref 0.30–0.70)
Heparin Unfractionated: 0.42 IU/mL (ref 0.30–0.70)
Heparin Unfractionated: 0.51 IU/mL (ref 0.30–0.70)

## 2019-12-10 LAB — CBC
HCT: 47 % (ref 39.0–52.0)
Hemoglobin: 15.4 g/dL (ref 13.0–17.0)
MCH: 32.4 pg (ref 26.0–34.0)
MCHC: 32.8 g/dL (ref 30.0–36.0)
MCV: 98.9 fL (ref 80.0–100.0)
Platelets: 277 10*3/uL (ref 150–400)
RBC: 4.75 MIL/uL (ref 4.22–5.81)
RDW: 13.6 % (ref 11.5–15.5)
WBC: 8 10*3/uL (ref 4.0–10.5)
nRBC: 0 % (ref 0.0–0.2)

## 2019-12-10 MED ORDER — POTASSIUM CHLORIDE 10 MEQ/100ML IV SOLN
10.0000 meq | INTRAVENOUS | Status: AC
  Administered 2019-12-10 (×3): 10 meq via INTRAVENOUS
  Filled 2019-12-10 (×3): qty 100

## 2019-12-10 MED ORDER — HEPARIN (PORCINE) 25000 UT/250ML-% IV SOLN
2000.0000 [IU]/h | INTRAVENOUS | Status: AC
  Administered 2019-12-10: 2000 [IU]/h via INTRAVENOUS
  Filled 2019-12-10 (×2): qty 250

## 2019-12-10 MED ORDER — SODIUM CHLORIDE 0.9 % IV SOLN
INTRAVENOUS | Status: DC | PRN
  Administered 2019-12-10: 1000 mL via INTRAVENOUS

## 2019-12-10 MED ORDER — MAGNESIUM SULFATE 2 GM/50ML IV SOLN
2.0000 g | Freq: Once | INTRAVENOUS | Status: AC
  Administered 2019-12-10: 2 g via INTRAVENOUS
  Filled 2019-12-10: qty 50

## 2019-12-10 NOTE — H&P (View-Only) (Signed)
Progress Note    ASSESSMENT AND PLAN:   Acute severe recurrent necrotizing pancreatitis ( Etoh) with significant increase in fluid collections and PD disruption and SMV / PV thrombosis.  -Case discussed with our biliary endoscopist. Plan is for another attempt at ERCP tomorrow for possible PD stenting.  -Will hold heparin for procedure.  -Continue Meropenem, octreotide, symptom management.     SUBJECTIVE    No complaints   OBJECTIVE:     Vital signs in last 24 hours: Temp:  [98.3 F (36.8 C)-98.8 F (37.1 C)] 98.8 F (37.1 C) (12/22 0618) Pulse Rate:  [60-72] 72 (12/22 0618) Resp:  [16-18] 18 (12/22 0618) BP: (113-129)/(73-81) 129/81 (12/22 0618) SpO2:  [90 %-97 %] 96 % (12/22 0618) Last BM Date: 12/08/19 General:   Alert, well-developed male in NAD EENT:  Normal hearing, non icteric sclera   Heart:  Regular rate and rhythm;  No lower extremity edema   Pulm: Normal respiratory effort   Abdomen:  Soft, nondistended, nontender.  Normal bowel sounds.          Neurologic:  Alert and  oriented x4;  grossly normal neurologically. Psych:  Pleasant, cooperative.  Normal mood and affect.   Intake/Output from previous day: 12/21 0701 - 12/22 0700 In: 4144.1 [P.O.:960; I.V.:2584.1; IV Piggyback:600] Out: 250 [Urine:250] Intake/Output this shift: Total I/O In: 240 [P.O.:240] Out: 300 [Urine:300]  Lab Results: Recent Labs    12/08/19 0505 12/09/19 0453 12/10/19 0547  WBC 5.7 4.3 8.0  HGB 14.0 13.7 15.4  HCT 42.8 41.7 47.0  PLT 254 238 277   BMET Recent Labs    12/08/19 0505 12/09/19 0453 12/10/19 0547  NA 137 137 136  K 3.3* 3.5 3.6  CL 96* 97* 94*  CO2 30 28 27   GLUCOSE 90 98 98  BUN <5* <5* <5*  CREATININE 0.42* 0.39* 0.59*  CALCIUM 8.1* 8.1* 8.5*   LFT Recent Labs    12/10/19 0547  PROT 5.7*  ALBUMIN 2.8*  AST 24  ALT 52*  ALKPHOS 73  BILITOT 1.2   PT/INR No results for input(s): LABPROT, INR in the last 72 hours. Hepatitis  Panel No results for input(s): HEPBSAG, HCVAB, HEPAIGM, HEPBIGM in the last 72 hours.  CT ABDOMEN PELVIS W CONTRAST  Result Date: 12/08/2019 CLINICAL DATA:  Pancreatitis EXAM: CT ABDOMEN AND PELVIS WITH CONTRAST TECHNIQUE: Multidetector CT imaging of the abdomen and pelvis was performed using the standard protocol following bolus administration of intravenous contrast. CONTRAST:  12/10/2019 OMNIPAQUE IOHEXOL 300 MG/ML  SOLN COMPARISON:  December 2 second 2020 FINDINGS: Lower chest: There are small bilateral pleural effusions, left greater than right. There is atelectasis at the lung bases.The heart size is normal. Hepatobiliary: There is periportal edema. There is intrahepatic biliary ductal dilatation. There is a heterogeneous hypoattenuating region in hepatic segment 6/7 which is new from prior CT. The portal vein supplying this region of the liver appears to be occluded. The gallbladder is unremarkable. Pancreas: Again noted are findings of subacute pancreatitis with pancreatic necrosis involving the pancreatic neck. There are increasing peripancreatic fluid collections. Spleen: The spleen is borderline enlarged. Adrenals/Urinary Tract: --Adrenal glands: No adrenal hemorrhage. --Right kidney/ureter: No hydronephrosis or perinephric hematoma. --Left kidney/ureter: No hydronephrosis or perinephric hematoma. --Urinary bladder: Unremarkable. Stomach/Bowel: --Stomach/Duodenum: No hiatal hernia or other gastric abnormality. Normal duodenal course and caliber. There is a large well-formed fluid collection measuring approximately 6.5 by 7.1 cm abutting the gastric body. --Small bowel: No dilatation or inflammation. --Colon: Rectosigmoid  diverticulosis without acute inflammation. There is diffuse wall thickening of the hepatic flexure of the colon which is felt to be reactive in etiology. --Appendix: Not visualized. No right lower quadrant inflammation or free fluid. Vascular/Lymphatic: Atherosclerotic calcification is  present within the non-aneurysmal abdominal aorta, without hemodynamically significant stenosis. Thrombus is noted extending into the main portal vein with likely near complete occlusion of the left portal vein. There is branch occlusion involving the posterior branches of the right portal vein. There is thrombus extending into the SMV. The splenic vein remains patent. The splenic artery remains patent. --there are some mildly enlarged reactive lymph nodes in the retroperitoneal space. --No mesenteric lymphadenopathy. --No pelvic or inguinal lymphadenopathy. Reproductive: Unremarkable Other: There is a large volume of abdominal ascites which has significantly increased from prior study. There is mesenteric edema and haziness of the omentum. There is new diffuse body wall edema extending into the patient's scrotum. Musculoskeletal. No acute displaced fractures. IMPRESSION: 1. Significant interval increase in abdominal ascites and peripancreatic fluid collections. 2. Persistent nonocclusive thrombosis of the main portal vein with occlusive to near occlusive thrombus within the left portal vein. There is likely branch portal vein thrombosis involving the posterior divisions of the right hepatic lobe. There is persistent thrombus extending into the SMV. The splenic vein remains patent. 3. Persistent heterogeneous area involving hepatic segment 7/6, concerning for hepatic necrosis as seen on prior MRI. 4. Again noted are findings of pancreatitis with pancreatic necrosis of the pancreatic neck. 5. Worsening body wall edema. 6. Small bilateral pleural effusions, left greater than right. 7. Persistent wall thickening of the hepatic flexure of the colon felt to be reactive in etiology. Aortic Atherosclerosis (ICD10-I70.0). Electronically Signed   By: Constance Holster M.D.   On: 12/08/2019 19:18    Active Problems:   Pancreatitis, acute   Elevated LFTs   Elevated hemoglobin (HCC)   Pancreatic ascites   Abdominal  pain, epigastric   Portal vein thrombosis   Bile leak   Choledocholithiasis     LOS: 7 days   Tye Savoy ,NP 12/10/2019, 10:30 AM    Attending physician's note   I have taken an interval history, reviewed the chart and examined the patient. I agree with the Advanced Practitioner's note, impression and recommendations.   For ERCP with possible PD stenting tomorrow with Dr Rush Landmark. Discussed in detail Hold heparin 6hrs before. Continue Meropenem, octreotide  Carmell Austria, MD [facility] [phone].

## 2019-12-10 NOTE — Progress Notes (Signed)
ANTICOAGULATION CONSULT NOTE - Follow Up Consult  Pharmacy Consult for Heparin Indication: portal vein thrombosis   Allergies  Allergen Reactions  . Penicillins Rash    Has patient had a PCN reaction causing immediate rash, facial/tongue/throat swelling, SOB or lightheadedness with hypotension: yes Has patient had a PCN reaction causing severe rash involving mucus membranes or skin necrosis: Yes Has patient had a PCN reaction that required hospitalization: No Has patient had a PCN reaction occurring within the last 10 years: No If all of the above answers are "NO", then may proceed with Cephalosporin use.     Patient Measurements: Height: 5\' 9"  (175.3 cm) Weight: 184 lb 12.8 oz (83.8 kg) IBW/kg (Calculated) : 70.7 Heparin Dosing Weight:   Vital Signs: Temp: 98.8 F (37.1 C) (12/22 0618) Temp Source: Oral (12/22 0618) BP: 129/81 (12/22 0618) Pulse Rate: 72 (12/22 0618)  Labs: Recent Labs    12/08/19 0505 12/09/19 0453 12/09/19 1249 12/10/19 0547  HGB 14.0 13.7  --  15.4  HCT 42.8 41.7  --  47.0  PLT 254 238  --  277  HEPARINUNFRC 0.44 0.33 0.47 0.28*  CREATININE 0.42* 0.39*  --  0.59*    Estimated Creatinine Clearance: 124 mL/min (A) (by C-G formula based on SCr of 0.59 mg/dL (L)).   Medications:  Infusions:  . heparin    . lactated ringers 50 mL/hr at 12/09/19 2318  . meropenem (MERREM) IV 1 g (12/10/19 3716)    Assessment: Pharmacy is consulted to dose heparin in 38 yo male diagnosed with portal vein thrombosis. CT done on 12/17 shows "venous thrombosis which is extensive in the SMV, main portal, left portal and right portal. There is still some patency of main portal vein and right portal branches."     12/21  HL is 0.33, therapeutic  CBC stable  No line or bleeding issues per RN  Today, 12/22  0547 HL = 0.28 below goal, no bleeding or IV interuptions per RN. CBC stable  Goal of Therapy:  Heparin level 0.3-0.7 units/ml Monitor platelets by  anticoagulation protocol: Yes   Plan:   Increase heparin drip to 2000 units/hr  Daily CBC while on heparin drip  Recheck HL in 6 hours  Watch for when ERCP is scheduled and need to stop heparin   Monitor for signs and symptoms of bleeding   12/10/2019 6:37 AM  Dorrene German

## 2019-12-10 NOTE — Progress Notes (Addendum)
Progress Note    ASSESSMENT AND PLAN:   Acute severe recurrent necrotizing pancreatitis ( [place]) with significant increase in fluid collections and PD disruption and SMV / PV thrombosis.  -Case discussed with our biliary endoscopist. Plan is for another attempt at ERCP tomorrow for possible PD stenting.  -Will hold heparin for procedure.  -Continue Meropenem, octreotide, symptom management.     SUBJECTIVE    No complaints   OBJECTIVE:     Vital signs in last 24 hours: Temp:  [98.3 F (36.8 C)-98.8 F (37.1 C)] 98.8 F (37.1 C) (12/22 0618) Pulse Rate:  [60-72] 72 (12/22 0618) Resp:  [16-18] 18 (12/22 0618) BP: (113-129)/(73-81) 129/81 (12/22 0618) SpO2:  [90 %-97 %] 96 % (12/22 0618) Last BM Date: 12/08/19 General:   Alert, well-developed male in NAD EENT:  Normal hearing, non icteric sclera   Heart:  Regular rate and rhythm;  No lower extremity edema   Pulm: Normal respiratory effort   Abdomen:  Soft, nondistended, nontender.  Normal bowel sounds.          Neurologic:  Alert and  oriented x4;  grossly normal neurologically. Psych:  Pleasant, cooperative.  Normal mood and affect.   Intake/Output from previous day: 12/21 0701 - 12/22 0700 In: 4144.1 [P.O.:960; I.V.:2584.1; IV Piggyback:600] Out: 250 [Urine:250] Intake/Output this shift: Total I/O In: 240 [P.O.:240] Out: 300 [Urine:300]  Lab Results: Recent Labs    12/08/19 0505 12/09/19 0453 12/10/19 0547  WBC 5.7 4.3 8.0  HGB 14.0 13.7 15.4  HCT 42.8 41.7 47.0  PLT 254 238 277   BMET Recent Labs    12/08/19 0505 12/09/19 0453 12/10/19 0547  NA 137 137 136  K 3.3* 3.5 3.6  CL 96* 97* 94*  CO2 30 28 27   GLUCOSE 90 98 98  BUN <5* <5* <5*  CREATININE 0.42* 0.39* 0.59*  CALCIUM 8.1* 8.1* 8.5*   LFT Recent Labs    12/10/19 0547  PROT 5.7*  ALBUMIN 2.8*  AST 24  ALT 52*  ALKPHOS 73  BILITOT 1.2   PT/INR No results for input(s): LABPROT, INR in the last 72 hours. Hepatitis  Panel No results for input(s): HEPBSAG, HCVAB, HEPAIGM, HEPBIGM in the last 72 hours.  CT ABDOMEN PELVIS W CONTRAST  Result Date: 12/08/2019 CLINICAL DATA:  Pancreatitis EXAM: CT ABDOMEN AND PELVIS WITH CONTRAST TECHNIQUE: Multidetector CT imaging of the abdomen and pelvis was performed using the standard protocol following bolus administration of intravenous contrast. CONTRAST:  12/10/2019 OMNIPAQUE IOHEXOL 300 MG/ML  SOLN COMPARISON:  December 2 second 2020 FINDINGS: Lower chest: There are small bilateral pleural effusions, left greater than right. There is atelectasis at the lung bases.The heart size is normal. Hepatobiliary: There is periportal edema. There is intrahepatic biliary ductal dilatation. There is a heterogeneous hypoattenuating region in hepatic segment 6/7 which is new from prior CT. The portal vein supplying this region of the liver appears to be occluded. The gallbladder is unremarkable. Pancreas: Again noted are findings of subacute pancreatitis with pancreatic necrosis involving the pancreatic neck. There are increasing peripancreatic fluid collections. Spleen: The spleen is borderline enlarged. Adrenals/Urinary Tract: --Adrenal glands: No adrenal hemorrhage. --Right kidney/ureter: No hydronephrosis or perinephric hematoma. --Left kidney/ureter: No hydronephrosis or perinephric hematoma. --Urinary bladder: Unremarkable. Stomach/Bowel: --Stomach/Duodenum: No hiatal hernia or other gastric abnormality. Normal duodenal course and caliber. There is a large well-formed fluid collection measuring approximately 6.5 by 7.1 cm abutting the gastric body. --Small bowel: No dilatation or inflammation. --Colon: Rectosigmoid  diverticulosis without acute inflammation. There is diffuse wall thickening of the hepatic flexure of the colon which is felt to be reactive in etiology. --Appendix: Not visualized. No right lower quadrant inflammation or free fluid. Vascular/Lymphatic: Atherosclerotic calcification is  present within the non-aneurysmal abdominal aorta, without hemodynamically significant stenosis. Thrombus is noted extending into the main portal vein with likely near complete occlusion of the left portal vein. There is branch occlusion involving the posterior branches of the right portal vein. There is thrombus extending into the SMV. The splenic vein remains patent. The splenic artery remains patent. --there are some mildly enlarged reactive lymph nodes in the retroperitoneal space. --No mesenteric lymphadenopathy. --No pelvic or inguinal lymphadenopathy. Reproductive: Unremarkable Other: There is a large volume of abdominal ascites which has significantly increased from prior study. There is mesenteric edema and haziness of the omentum. There is new diffuse body wall edema extending into the patient's scrotum. Musculoskeletal. No acute displaced fractures. IMPRESSION: 1. Significant interval increase in abdominal ascites and peripancreatic fluid collections. 2. Persistent nonocclusive thrombosis of the main portal vein with occlusive to near occlusive thrombus within the left portal vein. There is likely branch portal vein thrombosis involving the posterior divisions of the right hepatic lobe. There is persistent thrombus extending into the SMV. The splenic vein remains patent. 3. Persistent heterogeneous area involving hepatic segment 7/6, concerning for hepatic necrosis as seen on prior MRI. 4. Again noted are findings of pancreatitis with pancreatic necrosis of the pancreatic neck. 5. Worsening body wall edema. 6. Small bilateral pleural effusions, left greater than right. 7. Persistent wall thickening of the hepatic flexure of the colon felt to be reactive in etiology. Aortic Atherosclerosis (ICD10-I70.0). Electronically Signed   By: Constance Holster M.D.   On: 12/08/2019 19:18    Active Problems:   Pancreatitis, acute   Elevated LFTs   Elevated hemoglobin (HCC)   Pancreatic ascites   Abdominal  pain, epigastric   Portal vein thrombosis   Bile leak   Choledocholithiasis     LOS: 7 days   Tye Savoy ,NP 12/10/2019, 10:30 AM    Attending physician's note   I have taken an interval history, reviewed the chart and examined the patient. I agree with the Advanced Practitioner's note, impression and recommendations.   For ERCP with possible PD stenting tomorrow with Dr Rush Landmark. Discussed in detail Hold heparin 6hrs before. Continue Meropenem, octreotide  Carmell Austria, MD [facility] [phone].

## 2019-12-10 NOTE — Progress Notes (Signed)
PROGRESS NOTE    Paul Allen  WGN:[id number] DOB: Apr 03, 1981 DOA: 12/02/2019 PCP: Patient, No Pcp Per   Brief Narrative: 38 year old Caucasian male with history of alcohol abuse admitted with acute alcoholic pancreatitis complicated with pancreatic duct rupture and extensive portal vein and SMV thrombosis  Assessment & Plan:   Active Problems:   Pancreatitis, acute   Elevated LFTs   Elevated hemoglobin (HCC)   Pancreatic ascites   Abdominal pain, epigastric   Portal vein thrombosis   Bile leak   Choledocholithiasis   #1 acute necrotizing pancreatitis with main pancreatic duct rupture/ SMV thrombosis and portal vein thrombosis likely secondary to alcohol abuse. CT 12/08/2019-Significant interval increase in abdominal ascites and peripancreatic fluid collections.  Persistent nonocclusive thrombosis of the main portal vein with occlusive to near occlusive thrombus within the left portal vein. There is likely branch portal vein thrombosis involving the posterior divisions of the right hepatic lobe. There is persistent thrombus extending into the SMV. The splenic vein remains patent.Persistent heterogeneous area involving hepatic segment 7/6, concerning for hepatic necrosis as seen on prior MRI.  Again noted are findings of pancreatitis with pancreatic necrosis of the pancreatic neck.  Worsening body wall edema.  Small bilateral pleural effusions, left greater than right.  Persistent wall thickening of the hepatic flexure of the colon felt to be reactive in etiology. MRI abdomen showed extensive portal vein and SMV thrombosis with main pancreatic duct rupture and pancreatic fluid leak. Unable to visualize main pancreatic duct on ERCP due to duodenal edema GI planning for repeat ERCP and stenting tomorrow. Continue IV fluids pain management, clear liquids, Zofran, meropenem and octreotide.  Need to hold heparin 6 hours prior to procedure.  #2 extensive SMV thrombosis and  portal vein thrombosis on heparin drip  #3 scrotal edema-slowly improving with elevating both lower extremities overnight. I also encouraged and discussed with the staff to rule initiate and put under the scrotum to support it.  #4 elevated LFTs likely secondary to #1 and #2 improving AST 24 and ALT 52.  #5 hypokalemia  resolved potassium 3.6 magnesium 1.6 replete.    #6 QT prolongation QT 450 on Zofran standing dose EKG ordered yesterday not done will reorder again.     Nutrition Problem: Inadequate oral intake Etiology: inability to eat     Signs/Symptoms: NPO status    Interventions: Refer to RD note for recommendations  Estimated body mass index is 27.29 kg/m as calculated from the following:   Height as of this encounter: 5\' 9"  (1.753 m).   Weight as of this encounter: 83.8 kg.   DVT prophylaxis: Heparin drip  code Status: Full code Family Communication: None Disposition Plan: Pending clinical improvement  Consultants:   GI and general surgery  Procedures: ERCP Antimicrobials: Meropenem   Subjective:  He is sitting up in chair no new complaints anxious to have the procedure done edema decreased with ambulation and keeping his feet elevated. No bowel movements today Had nausea and vomiting yesterday evening Objective: Vitals:   12/09/19 0611 12/09/19 1503 12/09/19 2145 12/10/19 0618  BP: 126/82 113/74 125/73 129/81  Pulse: 60 60 60 72  Resp: 17 16 17 18   Temp: 98.2 F (36.8 C) 98.3 F (36.8 C) 98.4 F (36.9 C) 98.8 F (37.1 C)  TempSrc: Oral Oral Oral Oral  SpO2: 95% 90% 97% 96%  Weight:      Height:        Intake/Output Summary (Last 24 hours) at 12/10/2019 1352 Last data filed at  12/10/2019 1006 Gross per 24 hour  Intake 1959.84 ml  Output 550 ml  Net 1409.84 ml   Filed Weights   12/02/19 1933 12/07/19 0609  Weight: 74.4 kg 83.8 kg    Examination:  General exam: Appears calm and comfortable  Respiratory system: Clear to  auscultation. Respiratory effort normal. Cardiovascular system: S1 & S2 heard, RRR. No JVD, murmurs, rubs, gallops or clicks. No pedal edema. Gastrointestinal system: Abdomen is nondistended, soft and nontender. No organomegaly or masses felt. Normal bowel sounds heard. Central nervous system: Alert and oriented. No focal neurological deficits. Extremities: Symmetric 5 x 5 power. Skin: No rashes, lesions or ulcers Psychiatry: Judgement and insight appear normal. Mood & affect appropriate.     Data Reviewed: I have personally reviewed following labs and imaging studies  CBC: Recent Labs  Lab 12/06/19 0451 12/07/19 1241 12/08/19 0505 12/09/19 0453 12/10/19 0547  WBC 7.4 6.8 5.7 4.3 8.0  HGB 14.5 15.0 14.0 13.7 15.4  HCT 43.1 46.1 42.8 41.7 47.0  MCV 96.0 100.0 97.5 97.2 98.9  PLT 200 244 254 238 277   Basic Metabolic Panel: Recent Labs  Lab 12/06/19 1238 12/07/19 1241 12/08/19 0505 12/09/19 0453 12/09/19 1249 12/10/19 0547  NA 134* 137 137 137  --  136  K 3.2* 3.6 3.3* 3.5  --  3.6  CL 95* 95* 96* 97*  --  94*  CO2 27 31 30 28   --  27  GLUCOSE 70 75 90 98  --  98  BUN 6 <5* <5* <5*  --  <5*  CREATININE 0.38* 0.37* 0.42* 0.39*  --  0.59*  CALCIUM 8.1* 8.2* 8.1* 8.1*  --  8.5*  MG 1.6*  --   --   --  1.6*  --    GFR: Estimated Creatinine Clearance: 124 mL/min (A) (by C-G formula based on SCr of 0.59 mg/dL (L)). Liver Function Tests: Recent Labs  Lab 12/06/19 1238 12/07/19 1241 12/08/19 0505 12/09/19 0453 12/10/19 0547  AST 30 31 25 23 24   ALT 105* 93* 69* 57* 52*  ALKPHOS 72 76 77 66 73  BILITOT 1.7* 1.5* 1.0 1.3* 1.2  PROT 5.2* 5.6* 5.3* 4.9* 5.7*  ALBUMIN 2.5* 2.8* 2.5* 2.5* 2.8*   No results for input(s): LIPASE, AMYLASE in the last 168 hours. No results for input(s): AMMONIA in the last 168 hours. Coagulation Profile: Recent Labs  Lab 12/05/19 0935  INR 1.3*   Cardiac Enzymes: No results for input(s): CKTOTAL, CKMB, CKMBINDEX, TROPONINI in the  last 168 hours. BNP (last 3 results) No results for input(s): PROBNP in the last 8760 hours. HbA1C: No results for input(s): HGBA1C in the last 72 hours. CBG: No results for input(s): GLUCAP in the last 168 hours. Lipid Profile: No results for input(s): CHOL, HDL, LDLCALC, TRIG, CHOLHDL, LDLDIRECT in the last 72 hours. Thyroid Function Tests: No results for input(s): TSH, T4TOTAL, FREET4, T3FREE, THYROIDAB in the last 72 hours. Anemia Panel: No results for input(s): VITAMINB12, FOLATE, FERRITIN, TIBC, IRON, RETICCTPCT in the last 72 hours. Sepsis Labs: No results for input(s): PROCALCITON, LATICACIDVEN in the last 168 hours.  Recent Results (from the past 240 hour(s))  SARS CORONAVIRUS 2 (TAT 6-24 HRS) Nasopharyngeal Nasopharyngeal Swab     Status: None   Collection Time: 12/03/19 12:42 AM   Specimen: Nasopharyngeal Swab  Result Value Ref Range Status   SARS Coronavirus 2 NEGATIVE NEGATIVE Final    Comment: (NOTE) SARS-CoV-2 target nucleic acids are NOT DETECTED. The SARS-CoV-2 RNA is  generally detectable in upper and lower respiratory specimens during the acute phase of infection. Negative results do not preclude SARS-CoV-2 infection, do not rule out co-infections with other pathogens, and should not be used as the sole basis for treatment or other patient management decisions. Negative results must be combined with clinical observations, patient history, and epidemiological information. The expected result is Negative. Fact Sheet for Patients: HairSlick.no Fact Sheet for Healthcare Providers: quierodirigir.com This test is not yet approved or cleared by the [place] FDA and  has been authorized for detection and/or diagnosis of SARS-CoV-2 by FDA under an Emergency Use Authorization (EUA). This EUA will remain  in effect (meaning this test can be used) for the duration of the COVID-19 declaration under Section 56  4(b)(1) of the Act, 21 U.S.C. section 360bbb-3(b)(1), unless the authorization is terminated or revoked sooner. Performed at [facility], [address], [place], [place] [address]          Radiology Studies: CT ABDOMEN PELVIS W CONTRAST  Result Date: 12/08/2019 CLINICAL DATA:  Pancreatitis EXAM: CT ABDOMEN AND PELVIS WITH CONTRAST TECHNIQUE: Multidetector CT imaging of the abdomen and pelvis was performed using the standard protocol following bolus administration of intravenous contrast. CONTRAST:  OMNIPAQUE IOHEXOL 300 MG/ML  SOLN COMPARISON:  December 2 second 2020 FINDINGS: Lower chest: There are small bilateral pleural effusions, left greater than right. There is atelectasis at the lung bases.The heart size is normal. Hepatobiliary: There is periportal edema. There is intrahepatic biliary ductal dilatation. There is a heterogeneous hypoattenuating region in hepatic segment 6/7 which is new from prior CT. The portal vein supplying this region of the liver appears to be occluded. The gallbladder is unremarkable. Pancreas: Again noted are findings of subacute pancreatitis with pancreatic necrosis involving the pancreatic neck. There are increasing peripancreatic fluid collections. Spleen: The spleen is borderline enlarged. Adrenals/Urinary Tract: --Adrenal glands: No adrenal hemorrhage. --Right kidney/ureter: No hydronephrosis or perinephric hematoma. --Left kidney/ureter: No hydronephrosis or perinephric hematoma. --Urinary bladder: Unremarkable. Stomach/Bowel: --Stomach/Duodenum: No hiatal hernia or other gastric abnormality. Normal duodenal course and caliber. There is a large well-formed fluid collection measuring approximately 6.5 by 7.1 cm abutting the gastric body. --Small bowel: No dilatation or inflammation. --Colon: Rectosigmoid diverticulosis without acute inflammation. There is diffuse wall thickening of the hepatic flexure of the colon which is felt to be reactive in  etiology. --Appendix: Not visualized. No right lower quadrant inflammation or free fluid. Vascular/Lymphatic: Atherosclerotic calcification is present within the non-aneurysmal abdominal aorta, without hemodynamically significant stenosis. Thrombus is noted extending into the main portal vein with likely near complete occlusion of the left portal vein. There is branch occlusion involving the posterior branches of the right portal vein. There is thrombus extending into the SMV. The splenic vein remains patent. The splenic artery remains patent. --there are some mildly enlarged reactive lymph nodes in the retroperitoneal space. --No mesenteric lymphadenopathy. --No pelvic or inguinal lymphadenopathy. Reproductive: Unremarkable Other: There is a large volume of abdominal ascites which has significantly increased from prior study. There is mesenteric edema and haziness of the omentum. There is new diffuse body wall edema extending into the patient's scrotum. Musculoskeletal. No acute displaced fractures. IMPRESSION: 1. Significant interval increase in abdominal ascites and peripancreatic fluid collections. 2. Persistent nonocclusive thrombosis of the main portal vein with occlusive to near occlusive thrombus within the left portal vein. There is likely branch portal vein thrombosis involving the posterior divisions of the right hepatic lobe. There is persistent thrombus extending  into the SMV. The splenic vein remains patent. 3. Persistent heterogeneous area involving hepatic segment 7/6, concerning for hepatic necrosis as seen on prior MRI. 4. Again noted are findings of pancreatitis with pancreatic necrosis of the pancreatic neck. 5. Worsening body wall edema. 6. Small bilateral pleural effusions, left greater than right. 7. Persistent wall thickening of the hepatic flexure of the colon felt to be reactive in etiology. Aortic Atherosclerosis (ICD10-I70.0). Electronically Signed   By: Katherine Mantle M.D.   On:  12/08/2019 19:18        Scheduled Meds: . folic acid  1 mg Oral Daily  . indomethacin  100 mg Rectal Once  . multivitamin with minerals  1 tablet Oral Daily  . nicotine  7 mg Transdermal Daily  . octreotide  100 mcg Subcutaneous Q8H  . ondansetron (ZOFRAN) IV  4 mg Intravenous Q8H  . pantoprazole (PROTONIX) IV  40 mg Intravenous Q12H  . thiamine  100 mg Oral Daily   Continuous Infusions: . heparin 2,000 Units/hr (12/10/19 0856)  . lactated ringers 50 mL/hr at 12/10/19 0738  . meropenem (MERREM) IV 1 g (12/10/19 1134)     LOS: 7 days     Alwyn Ren, MD [facility]  If 7PM-7AM, please contact night-coverage [email or web address] Password [email or web address] 12/10/2019, 1:52 PM

## 2019-12-10 NOTE — Progress Notes (Signed)
ANTICOAGULATION CONSULT NOTE - Follow Up Consult  Pharmacy Consult for Heparin Indication: portal vein thrombosis   Allergies  Allergen Reactions  . Penicillins Rash    Has patient had a PCN reaction causing immediate rash, facial/tongue/throat swelling, SOB or lightheadedness with hypotension: yes Has patient had a PCN reaction causing severe rash involving mucus membranes or skin necrosis: Yes Has patient had a PCN reaction that required hospitalization: No Has patient had a PCN reaction occurring within the last 10 years: No If all of the above answers are "NO", then may proceed with Cephalosporin use.    Patient Measurements: Height: 5\' 9"  (175.3 cm) Weight: 184 lb 12.8 oz (83.8 kg) IBW/kg (Calculated) : 70.7 Heparin Dosing Weight:   Vital Signs: Temp: 98.8 F (37.1 C) (12/22 0618) Temp Source: Oral (12/22 0618) BP: 129/81 (12/22 0618) Pulse Rate: 72 (12/22 0618)  Labs: Recent Labs    12/08/19 0505 12/09/19 0453 12/09/19 1249 12/10/19 0547  HGB 14.0 13.7  --  15.4  HCT 42.8 41.7  --  47.0  PLT 254 238  --  277  HEPARINUNFRC 0.44 0.33 0.47 0.28*  CREATININE 0.42* 0.39*  --  0.59*   Estimated Creatinine Clearance: 124 mL/min (A) (by C-G formula based on SCr of 0.59 mg/dL (L)).  Medications:  Infusions:  . heparin 2,000 Units/hr (12/10/19 0856)  . lactated ringers 50 mL/hr at 12/10/19 0738  . meropenem (MERREM) IV 1 g (12/10/19 7425)   Assessment: Pharmacy is consulted to dose heparin in 38 yo male diagnosed with portal vein thrombosis. CT done on 12/17 shows "venous thrombosis which is extensive in the SMV, main portal, left portal and right portal. There is still some patency of main portal vein and right portal branches."   Goal of Therapy:  Heparin level 0.3-0.7 units/ml Monitor platelets by anticoagulation protocol: Yes   Today, 12/10/2019 0547 Hep level 0.28 units/ml, below therapeutic range, rate increased to 2000 units/hr 1328 Hep level 0.42  units/ml, in range ERCP scheduled for 12/23   Plan:   Continue Heparin drip at 2000 units/hr  Recheck hep level at 2200  Hold Heparin at 7am tomorrow, 12/23 for ERCP  Daily CBC while on heparin drip  Monitor for signs and symptoms of bleeding   12/10/2019 10:53 AM  Minda Ditto PharmD

## 2019-12-10 NOTE — Progress Notes (Signed)
ANTICOAGULATION CONSULT NOTE - Follow Up Consult  Pharmacy Consult for Heparin Indication: portal vein thrombosis   Allergies  Allergen Reactions  . Penicillins Rash    Has patient had a PCN reaction causing immediate rash, facial/tongue/throat swelling, SOB or lightheadedness with hypotension: yes Has patient had a PCN reaction causing severe rash involving mucus membranes or skin necrosis: Yes Has patient had a PCN reaction that required hospitalization: No Has patient had a PCN reaction occurring within the last 10 years: No If all of the above answers are "NO", then may proceed with Cephalosporin use.    Patient Measurements: Height: 5\' 9"  (175.3 cm) Weight: 184 lb 12.8 oz (83.8 kg) IBW/kg (Calculated) : 70.7 Heparin Dosing Weight:   Vital Signs: Temp: 98.7 F (37.1 C) (12/22 1956) Temp Source: Oral (12/22 1956) BP: 139/94 (12/22 1956) Pulse Rate: 56 (12/22 1956)  Labs: Recent Labs    12/08/19 0505 12/09/19 0453 12/10/19 0547 12/10/19 1318 12/10/19 2131  HGB 14.0 13.7 15.4  --   --   HCT 42.8 41.7 47.0  --   --   PLT 254 238 277  --   --   HEPARINUNFRC 0.44 0.33 0.28* 0.42 0.51  CREATININE 0.42* 0.39* 0.59*  --   --    Estimated Creatinine Clearance: 124 mL/min (A) (by C-G formula based on SCr of 0.59 mg/dL (L)).  Medications:  Infusions:  . sodium chloride 1,000 mL (12/10/19 1522)  . heparin 2,000 Units/hr (12/10/19 0856)  . lactated ringers 50 mL/hr at 12/10/19 0738  . meropenem (MERREM) IV 1 g (12/10/19 1134)   Assessment: Pharmacy is consulted to dose heparin in 39 yo male diagnosed with portal vein thrombosis. CT done on 12/17 shows "venous thrombosis which is extensive in the SMV, main portal, left portal and right portal. There is still some patency of main portal vein and right portal branches."   Goal of Therapy:  Heparin level 0.3-0.7 units/ml Monitor platelets by anticoagulation protocol: Yes   Today, 12/10/2019 0547 Hep level 0.28 units/ml,  below therapeutic range, rate increased to 2000 units/hr 1328 Hep level 0.42 units/ml, in range 2131 HL = 0.51 at goal, no problems reported by. RN. ERCP scheduled for 12/23   Plan:   Continue Heparin drip at 2000 units/hr  Hold Heparin at 7am tomorrow, 12/23 for ERCP  Daily CBC while on heparin drip  Monitor for signs and symptoms of bleeding   12/10/2019 11:36 PM  Dorrene German PharmD

## 2019-12-11 ENCOUNTER — Encounter (HOSPITAL_COMMUNITY)
Admission: EM | Disposition: A | Discharge status: Home / Self Care | Payer: Self-pay | Source: Home / Self Care | Attending: Internal Medicine

## 2019-12-11 ENCOUNTER — Inpatient Hospital Stay (HOSPITAL_COMMUNITY): Payer: Self-pay

## 2019-12-11 ENCOUNTER — Inpatient Hospital Stay (HOSPITAL_COMMUNITY): Payer: Self-pay | Admitting: Certified Registered Nurse Anesthetist

## 2019-12-11 ENCOUNTER — Encounter (HOSPITAL_COMMUNITY): Payer: Self-pay | Admitting: Internal Medicine

## 2019-12-11 DIAGNOSIS — K838 Other specified diseases of biliary tract: Secondary | ICD-10-CM

## 2019-12-11 DIAGNOSIS — K2980 Duodenitis without bleeding: Secondary | ICD-10-CM

## 2019-12-11 DIAGNOSIS — K8689 Other specified diseases of pancreas: Secondary | ICD-10-CM

## 2019-12-11 HISTORY — PX: ERCP: SHX5425

## 2019-12-11 HISTORY — PX: SPHINCTEROTOMY: SHX5544

## 2019-12-11 HISTORY — PX: PANCREATIC STENT PLACEMENT: SHX5539

## 2019-12-11 LAB — COMPREHENSIVE METABOLIC PANEL
ALT: 46 U/L — ABNORMAL HIGH (ref 0–44)
AST: 27 U/L (ref 15–41)
Albumin: 2.7 g/dL — ABNORMAL LOW (ref 3.5–5.0)
Alkaline Phosphatase: 66 U/L (ref 38–126)
Anion gap: 9 (ref 5–15)
BUN: <5 mg/dL — ABNORMAL LOW (ref 6–20)
CO2: 30 mmol/L (ref 22–32)
Calcium: 8.3 mg/dL — ABNORMAL LOW (ref 8.9–10.3)
Chloride: 100 mmol/L (ref 98–111)
Creatinine, Ser: 0.55 mg/dL — ABNORMAL LOW (ref 0.61–1.24)
GFR calc Af Amer: >60 mL/min (ref >60)
GFR calc non Af Amer: >60 mL/min (ref >60)
Glucose, Bld: 117 mg/dL — ABNORMAL HIGH (ref 70–99)
Potassium: 3.7 mmol/L (ref 3.5–5.1)
Sodium: 139 mmol/L (ref 135–145)
Total Bilirubin: 0.9 mg/dL (ref 0.3–1.2)
Total Protein: 5.5 g/dL — ABNORMAL LOW (ref 6.5–8.1)

## 2019-12-11 LAB — CBC
HCT: 44.7 % (ref 39.0–52.0)
Hemoglobin: 14.7 g/dL (ref 13.0–17.0)
MCH: 32.5 pg (ref 26.0–34.0)
MCHC: 32.9 g/dL (ref 30.0–36.0)
MCV: 98.9 fL (ref 80.0–100.0)
Platelets: 272 10*3/uL (ref 150–400)
RBC: 4.52 MIL/uL (ref 4.22–5.81)
RDW: 13.8 % (ref 11.5–15.5)
WBC: 5.6 10*3/uL (ref 4.0–10.5)
nRBC: 0 % (ref 0.0–0.2)

## 2019-12-11 LAB — HEPARIN LEVEL (UNFRACTIONATED): Heparin Unfractionated: 0.65 IU/mL (ref 0.30–0.70)

## 2019-12-11 SURGERY — ERCP, WITH INTERVENTION IF INDICATED
Anesthesia: General

## 2019-12-11 MED ORDER — LIDOCAINE 2% (20 MG/ML) 5 ML SYRINGE
INTRAMUSCULAR | Status: DC | PRN
  Administered 2019-12-11: 60 mg via INTRAVENOUS

## 2019-12-11 MED ORDER — SUCCINYLCHOLINE CHLORIDE 200 MG/10ML IV SOSY
PREFILLED_SYRINGE | INTRAVENOUS | Status: DC | PRN
  Administered 2019-12-11: 140 mg via INTRAVENOUS

## 2019-12-11 MED ORDER — ONDANSETRON HCL 4 MG/2ML IJ SOLN
INTRAMUSCULAR | Status: DC | PRN
  Administered 2019-12-11: 4 mg via INTRAVENOUS

## 2019-12-11 MED ORDER — GLUCAGON HCL RDNA (DIAGNOSTIC) 1 MG IJ SOLR
INTRAMUSCULAR | Status: DC | PRN
  Administered 2019-12-11 (×5): .25 mg via INTRAVENOUS

## 2019-12-11 MED ORDER — PRO-STAT SUGAR FREE PO LIQD
30.0000 mL | Freq: Three times a day (TID) | ORAL | Status: DC
  Administered 2019-12-12 – 2019-12-15 (×7): 30 mL via ORAL
  Filled 2019-12-11 (×6): qty 30

## 2019-12-11 MED ORDER — HEPARIN (PORCINE) 25000 UT/250ML-% IV SOLN
2000.0000 [IU]/h | INTRAVENOUS | Status: DC
  Administered 2019-12-11: 2000 [IU]/h via INTRAVENOUS
  Filled 2019-12-11 (×2): qty 250

## 2019-12-11 MED ORDER — ROCURONIUM BROMIDE 50 MG/5ML IV SOSY
PREFILLED_SYRINGE | INTRAVENOUS | Status: DC | PRN
  Administered 2019-12-11: 40 mg via INTRAVENOUS

## 2019-12-11 MED ORDER — DEXAMETHASONE SODIUM PHOSPHATE 10 MG/ML IJ SOLN
INTRAMUSCULAR | Status: DC | PRN
  Administered 2019-12-11: 10 mg via INTRAVENOUS

## 2019-12-11 MED ORDER — GLUCAGON HCL RDNA (DIAGNOSTIC) 1 MG IJ SOLR
INTRAMUSCULAR | Status: AC
  Filled 2019-12-11: qty 2

## 2019-12-11 MED ORDER — FENTANYL CITRATE (PF) 100 MCG/2ML IJ SOLN
INTRAMUSCULAR | Status: DC | PRN
  Administered 2019-12-11 (×2): 50 ug via INTRAVENOUS
  Administered 2019-12-11: 100 ug via INTRAVENOUS

## 2019-12-11 MED ORDER — MAGNESIUM SULFATE 2 GM/50ML IV SOLN
2.0000 g | Freq: Once | INTRAVENOUS | Status: AC
  Administered 2019-12-11: 12:00:00 2 g via INTRAVENOUS
  Filled 2019-12-11: qty 50

## 2019-12-11 MED ORDER — SUGAMMADEX SODIUM 200 MG/2ML IV SOLN
INTRAVENOUS | Status: DC | PRN
  Administered 2019-12-11: 200 mg via INTRAVENOUS

## 2019-12-11 MED ORDER — HEPARIN (PORCINE) 25000 UT/250ML-% IV SOLN: 2000.0000 [IU]/h | INTRAVENOUS | Status: DC

## 2019-12-11 MED ORDER — MIDAZOLAM HCL 5 MG/5ML IJ SOLN
INTRAMUSCULAR | Status: DC | PRN
  Administered 2019-12-11: 2 mg via INTRAVENOUS

## 2019-12-11 MED ORDER — BOOST / RESOURCE BREEZE PO LIQD CUSTOM
1.0000 | Freq: Three times a day (TID) | ORAL | Status: DC
  Administered 2019-12-11 – 2019-12-13 (×5): 1 via ORAL
  Administered 2019-12-13: 21:00:00 237 mL via ORAL
  Administered 2019-12-14 – 2019-12-15 (×4): 1 via ORAL

## 2019-12-11 MED ORDER — MIDAZOLAM HCL 2 MG/2ML IJ SOLN
INTRAMUSCULAR | Status: AC
  Filled 2019-12-11: qty 2

## 2019-12-11 MED ORDER — FENTANYL CITRATE (PF) 100 MCG/2ML IJ SOLN
INTRAMUSCULAR | Status: AC
  Filled 2019-12-11: qty 2

## 2019-12-11 MED ORDER — INDOMETHACIN 50 MG RE SUPP
RECTAL | Status: DC | PRN
  Administered 2019-12-11: 100 mg via RECTAL

## 2019-12-11 MED ORDER — INDOMETHACIN 50 MG RE SUPP: 100.0000 mg | Freq: Once | RECTAL | Status: DC

## 2019-12-11 MED ORDER — SODIUM CHLORIDE 0.9 % IV SOLN
INTRAVENOUS | Status: DC | PRN
  Administered 2019-12-11: 20 mL

## 2019-12-11 MED ORDER — SODIUM CHLORIDE 0.9 % IV SOLN: INTRAVENOUS | Status: DC

## 2019-12-11 MED ORDER — PROPOFOL 10 MG/ML IV BOLUS
INTRAVENOUS | Status: DC | PRN
  Administered 2019-12-11: 160 mg via INTRAVENOUS

## 2019-12-11 MED ORDER — PHENYLEPHRINE 40 MCG/ML (10ML) SYRINGE FOR IV PUSH (FOR BLOOD PRESSURE SUPPORT)
PREFILLED_SYRINGE | INTRAVENOUS | Status: DC | PRN
  Administered 2019-12-11 (×4): 80 ug via INTRAVENOUS

## 2019-12-11 MED ORDER — INDOMETHACIN 50 MG RE SUPP
RECTAL | Status: AC
  Filled 2019-12-11: qty 2

## 2019-12-11 MED ORDER — PROPOFOL 10 MG/ML IV BOLUS
INTRAVENOUS | Status: AC
  Filled 2019-12-11: qty 20

## 2019-12-11 NOTE — Progress Notes (Signed)
Nutrition Follow-up  RD working remotely.   DOCUMENTATION CODES:   Not applicable  INTERVENTION:  - diet advancement as medically feasible. - re-weigh patient today.  - will order Boost Breeze TID, each supplement provides 250 kcal and 9 grams of protein. - will order 30 mL Prostat TID, each supplement provides 100 kcal and 15 grams of protein.   * highly recommend post-pyloric small bore NGT placement (preferrably to the LOT) and initiate TF as patient has been NPO or on CLD for >1 week.   NUTRITION DIAGNOSIS:   Inadequate oral intake related to inability to eat as evidenced by NPO status. -ongoing  GOAL:   Patient will meet greater than or equal to 90% of their needs -unmet/unable to meet at this time  MONITOR:   Diet advancement, Labs, Weight trends, I & O's  ASSESSMENT:   38 y.o. male with medical history significant of history of alcohol abuse and pancreatitis. He has been experiencing epigastric pain for the past few weeks and was diagnosed outpatient with acute pancreatitis. His pain progressively worsened so he presented to the ED.  Diet advanced from NPO to CLD on 12/18 at 1235 and patient consumed 75% of dinner that time. He was again made NPO today at 0710. Patient is currently out of the room to Diagnostic Radiology. Weight on 12/19 compared to weight on 12/14 (admission) significantly; no weight recorded since 12/19.   Per notes: - ERCP today (12/23) - acute necrotizing pancreatitis with main pancreatic duct rupture and portal vein thrombosis likely 2/2 alcohol abuse - scrotal edema--improving - elevated LFTs--improving - hypokalemia--resolved    Labs reviewed; BUN: <5 mg/dl, creatinine: 0.55 mg/dl, Ca: 8.3 mg/dl. Medications reviewed; 1 mg folic acid/day, 2 g IV Mg sulfate x1 run 12/22, and x1 run 12/23, daily multivitamin with minerals, 10 mEq IV KCl x2 runs 12/22, 100 mg oral thiamine/day.  IVF; LR @ 50 ml/hr.     NUTRITION - FOCUSED PHYSICAL  EXAM:  unable to complete at this time.   Diet Order:   Diet Order            Diet NPO time specified  Diet effective now              EDUCATION NEEDS:   Not appropriate for education at this time  Skin:  Skin Assessment: Reviewed RN Assessment  Last BM:  12/20  Height:   Ht Readings from Last 1 Encounters:  12/02/19 5\' 9"  (1.753 m)    Weight:   Wt Readings from Last 1 Encounters:  12/07/19 83.8 kg    Ideal Body Weight:  72.7 kg  BMI:  Body mass index is 27.29 kg/m.  Estimated Nutritional Needs:   Kcal:  6333-5456 kcal  Protein:  105-120 grams  Fluid:  >/= 2.3 L/day      Jarome Matin, MS, RD, LDN, [facility] Inpatient Clinical Dietitian Pager # [phone] After hours/weekend pager # [phone]

## 2019-12-11 NOTE — Interval H&P Note (Signed)
History and Physical Interval Note:  12/11/2019 1:32 PM  Paul Allen  has presented today for surgery, with the diagnosis of Pancreatic Leak.  The various methods of treatment have been discussed with the patient and family. After consideration of risks, benefits and other options for treatment, the patient has consented to  Procedure(s): ENDOSCOPIC RETROGRADE CHOLANGIOPANCREATOGRAPHY (ERCP) (N/A) as a surgical intervention.  The patient's history has been reviewed, patient examined, no change in status, stable for surgery.  I have reviewed the patient's chart and labs.  Questions were answered to the patient's satisfaction.     [employer]

## 2019-12-11 NOTE — Interval H&P Note (Signed)
History and Physical Interval Note:  12/11/2019 1:32 PM  Paul Allen  has presented today for surgery, with the diagnosis of Pancreatic Leak.  The various methods of treatment have been discussed with the patient and family. After consideration of risks, benefits and other options for treatment, the patient has consented to  Procedure(s): ENDOSCOPIC RETROGRADE CHOLANGIOPANCREATOGRAPHY (ERCP) (N/A) as a surgical intervention.  The patient's history has been reviewed, patient examined, no change in status, stable for surgery.  I have reviewed the patient's chart and labs.  Questions were answered to the patient's satisfaction.     The risks of an ERCP were discussed at length, including but not limited to the risk of perforation, bleeding, abdominal pain, post-ERCP pancreatitis (while usually mild can be severe and even life threatening).   The risks and benefits of endoscopic evaluation were discussed with the patient; these include but are not limited to the risk of perforation, infection, bleeding, missed lesions, lack of diagnosis, severe illness requiring hospitalization, as well as anesthesia and sedation related illnesses.  The patient is agreeable to proceed.     [employer]

## 2019-12-11 NOTE — Progress Notes (Signed)
PROGRESS NOTE    Lorinda CreedBrian E Danis  ZOX:[id number] DOB: 10/16/1981 DOA: 12/02/2019 PCP: Patient, No Pcp Per    Brief Narrative:38 year old Caucasian male with history of alcohol abuse admitted with acute alcoholic pancreatitis complicated with pancreatic duct rupture and extensive portal vein and SMV thrombosis  Assessment & Plan:   Active Problems:   Pancreatitis, acute   Elevated LFTs   Elevated hemoglobin (HCC)   Pancreatic ascites   Abdominal pain, epigastric   Portal vein thrombosis   Bile leak   Choledocholithiasis   #1 acute necrotizing pancreatitis with main pancreatic duct rupture/ SMV thrombosis and portal vein thrombosis likely secondary to alcohol abuse.  CT 12/08/2019-Significant interval increase in abdominal ascites and peripancreatic fluid collections.  Persistent nonocclusive thrombosis of the main portal vein with occlusive to near occlusive thrombus within the left portal vein. There is likely branch portal vein thrombosis involving the posterior divisions of the right hepatic lobe. There is persistent thrombus extending into the SMV. The splenic vein remains patent.Persistent heterogeneous area involving hepatic segment 7/6, concerning for hepatic necrosis as seen on prior MRI.  Again noted are findings of pancreatitis with pancreatic necrosis of the pancreatic neck.  Worsening body wall edema.  Small bilateral pleural effusions, left greater than right.  Persistent wall thickening of the hepatic flexure of the colon felt to be reactive in etiology. MRI abdomen showed extensive portal vein and SMV thrombosis with main pancreatic duct rupture and pancreatic fluid leak. Unable to visualize main pancreatic duct on ERCP due to duodenal edema  GI planning for repeat ERCP 12/11/2019 Continue IV fluids Continue pain management Dilaudid and oxycodone Continue clear liquids Continue Zofran Continue octreotide and meropenem  #2 extensive SMV thrombosis and  portal vein thrombosis on heparin drip, currently on hold for ERCP.  #3 scrotal edema-improving with support and elevating the leg.    #4 elevated LFTs likely secondary to #1 and #2 improving AST 23 from 25 from 31 ALT 57 from a 69 from 93. Total bilirubin is 1.3.  #5 hypokalemia repleted potassium 3.7.  Check magnesium.  #6 QT prolongation QT 482 from 450 will change Zofran to as needed.  And repeat EKG in a.m.     Nutrition Problem: Inadequate oral intake Etiology: inability to eat     Signs/Symptoms: NPO status    Interventions: Refer to RD note for recommendations  Estimated body mass index is 27.29 kg/m as calculated from the following:   Height as of this encounter: 5\' 9"  (1.753 m).   Weight as of this encounter: 83.8 kg.  DVT prophylaxis: Heparin drip  code Status: Full code Family Communication: None Disposition Plan: Pending clinical improvement  Consultants:   GI and general surgery  Procedures: ERCP Antimicrobials: Meropenem  Subjective:  Patient ambulating in the hallway anxious to have the procedure done today denies any nausea vomiting has not had a bowel movement.  Objective: Vitals:   12/10/19 1352 12/10/19 1353 12/10/19 1956 12/11/19 0522  BP: (!) 162/98 134/87 (!) 139/94 (!) 141/94  Pulse: (!) 59 (!) 54 (!) 56 (!) 54  Resp: 16  16 16   Temp: 98.2 F (36.8 C)  98.7 F (37.1 C) 98.1 F (36.7 C)  TempSrc: Oral  Oral Oral  SpO2: 97% 98% 99% 96%  Weight:      Height:        Intake/Output Summary (Last 24 hours) at 12/11/2019 1119 Last data filed at 12/11/2019 0700 Gross per 24 hour  Intake 1304.38 ml  Output 3100 ml  Net -1795.62 ml   Filed Weights   12/02/19 1933 12/07/19 0609  Weight: 74.4 kg 83.8 kg    Examination:  General exam: Appears calm and comfortable  Respiratory system: Clear to auscultation. Respiratory effort normal. Cardiovascular system: S1 & S2 heard, RRR. No JVD, murmurs, rubs, gallops or clicks. No  pedal edema. Gastrointestinal system: Abdomen is distended, soft and nontender. No organomegaly or masses felt. Normal bowel sounds heard. Central nervous system: Alert and oriented. No focal neurological deficits. Extremities: Symmetric 5 x 5 power. Skin: No rashes, lesions or ulcers Psychiatry: Judgement and insight appear normal. Mood & affect appropriate.     Data Reviewed: I have personally reviewed following labs and imaging studies  CBC: Recent Labs  Lab 12/07/19 1241 12/08/19 0505 12/09/19 0453 12/10/19 0547 12/11/19 0531  WBC 6.8 5.7 4.3 8.0 5.6  HGB 15.0 14.0 13.7 15.4 14.7  HCT 46.1 42.8 41.7 47.0 44.7  MCV 100.0 97.5 97.2 98.9 98.9  PLT 244 254 238 277 027   Basic Metabolic Panel: Recent Labs  Lab 12/06/19 1238 12/07/19 1241 12/08/19 0505 12/09/19 0453 12/09/19 1249 12/10/19 0547 12/11/19 0531  NA 134* 137 137 137  --  136 139  K 3.2* 3.6 3.3* 3.5  --  3.6 3.7  CL 95* 95* 96* 97*  --  94* 100  CO2 27 31 30 28   --  27 30  GLUCOSE 70 75 90 98  --  98 117*  BUN 6 <5* <5* <5*  --  <5* <5*  CREATININE 0.38* 0.37* 0.42* 0.39*  --  0.59* 0.55*  CALCIUM 8.1* 8.2* 8.1* 8.1*  --  8.5* 8.3*  MG 1.6*  --   --   --  1.6*  --   --    GFR: Estimated Creatinine Clearance: 124 mL/min (A) (by C-G formula based on SCr of 0.55 mg/dL (L)). Liver Function Tests: Recent Labs  Lab 12/07/19 1241 12/08/19 0505 12/09/19 0453 12/10/19 0547 12/11/19 0531  AST 31 25 23 24 27   ALT 93* 69* 57* 52* 46*  ALKPHOS 76 77 66 73 66  BILITOT 1.5* 1.0 1.3* 1.2 0.9  PROT 5.6* 5.3* 4.9* 5.7* 5.5*  ALBUMIN 2.8* 2.5* 2.5* 2.8* 2.7*   No results for input(s): LIPASE, AMYLASE in the last 168 hours. No results for input(s): AMMONIA in the last 168 hours. Coagulation Profile: Recent Labs  Lab 12/05/19 0935  INR 1.3*   Cardiac Enzymes: No results for input(s): CKTOTAL, CKMB, CKMBINDEX, TROPONINI in the last 168 hours. BNP (last 3 results) No results for input(s): PROBNP in the last  8760 hours. HbA1C: No results for input(s): HGBA1C in the last 72 hours. CBG: No results for input(s): GLUCAP in the last 168 hours. Lipid Profile: No results for input(s): CHOL, HDL, LDLCALC, TRIG, CHOLHDL, LDLDIRECT in the last 72 hours. Thyroid Function Tests: No results for input(s): TSH, T4TOTAL, FREET4, T3FREE, THYROIDAB in the last 72 hours. Anemia Panel: No results for input(s): VITAMINB12, FOLATE, FERRITIN, TIBC, IRON, RETICCTPCT in the last 72 hours. Sepsis Labs: No results for input(s): PROCALCITON, LATICACIDVEN in the last 168 hours.  Recent Results (from the past 240 hour(s))  SARS CORONAVIRUS 2 (TAT 6-24 HRS) Nasopharyngeal Nasopharyngeal Swab     Status: None   Collection Time: 12/03/19 12:42 AM   Specimen: Nasopharyngeal Swab  Result Value Ref Range Status   SARS Coronavirus 2 NEGATIVE NEGATIVE Final    Comment: (NOTE) SARS-CoV-2 target nucleic acids are NOT DETECTED. The SARS-CoV-2 RNA is generally detectable  in upper and lower respiratory specimens during the acute phase of infection. Negative results do not preclude SARS-CoV-2 infection, do not rule out co-infections with other pathogens, and should not be used as the sole basis for treatment or other patient management decisions. Negative results must be combined with clinical observations, patient history, and epidemiological information. The expected result is Negative. Fact Sheet for Patients: HairSlick.no Fact Sheet for Healthcare Providers: quierodirigir.com This test is not yet approved or cleared by the [place] FDA and  has been authorized for detection and/or diagnosis of SARS-CoV-2 by FDA under an Emergency Use Authorization (EUA). This EUA will remain  in effect (meaning this test can be used) for the duration of the COVID-19 declaration under Section 56 4(b)(1) of the Act, 21 U.S.C. section 360bbb-3(b)(1), unless the authorization is  terminated or revoked sooner. Performed at [facility], [address], [place], [place] [address]          Radiology Studies: No results found.      Scheduled Meds: . folic acid  1 mg Oral Daily  . indomethacin  100 mg Rectal Once  . multivitamin with minerals  1 tablet Oral Daily  . nicotine  7 mg Transdermal Daily  . octreotide  100 mcg Subcutaneous Q8H  . ondansetron (ZOFRAN) IV  4 mg Intravenous Q8H  . pantoprazole (PROTONIX) IV  40 mg Intravenous Q12H  . thiamine  100 mg Oral Daily   Continuous Infusions: . sodium chloride 1,000 mL (12/10/19 1522)  . lactated ringers 50 mL/hr at 12/11/19 1107  . meropenem (MERREM) IV 1 g (12/11/19 1106)     LOS: 8 days     Alwyn Ren, MD [facility]  If 7PM-7AM, please contact night-coverage [email or web address] Password [email or web address] 12/11/2019, 11:19 AM

## 2019-12-11 NOTE — Progress Notes (Signed)
Pt states he wants to wait until the morning until he speaks with GI again to have the cortrak placed. I explained that it would be delaying nutrition and he verbalizes understanding. Attempted to call on call GI with no answer.

## 2019-12-11 NOTE — Anesthesia Procedure Notes (Signed)
Procedure Name: Intubation Date/Time: 12/11/2019 2:04 PM Performed by: Montel Clock, CRNA Pre-anesthesia Checklist: Patient identified, Emergency Drugs available, Suction available, Patient being monitored and Timeout performed Patient Re-evaluated:Patient Re-evaluated prior to induction Oxygen Delivery Method: Circle system utilized Preoxygenation: Pre-oxygenation with 100% oxygen Induction Type: IV induction, Rapid sequence and Cricoid Pressure applied Laryngoscope Size: Mac and 3 Grade View: Grade I Tube type: Oral Tube size: 7.5 mm Number of attempts: 1 Airway Equipment and Method: Stylet Placement Confirmation: ETT inserted through vocal cords under direct vision,  positive ETCO2 and breath sounds checked- equal and bilateral Secured at: 22 cm Tube secured with: Tape Dental Injury: Teeth and Oropharynx as per pre-operative assessment

## 2019-12-11 NOTE — Op Note (Signed)
[facility] Patient Name: Paul Allen Procedure Date: 12/11/2019 MRN: [id number] Attending MD: Justice Britain , MD Date of Birth: [birth year] CSN: [id number] Age: 38 Admit Type: Outpatient Procedure:                ERCP Indications:              Pancreatic duct leak, Abnormal abdominal MRI Providers:                Justice Britain, MD Referring MD:             Jackquline Denmark, MD, [facility] Medicines:                General Anesthesia, Indomethacin 086 mg PR Complications:            No immediate complications. Estimated Blood Loss:     Estimated blood oss was minimal. Procedure:                Pre-Anesthesia Assessment:                           - Prior to the procedure, a History and Physical                            was performed, and patient medications and                            allergies were reviewed. The patient's tolerance of                            previous anesthesia was also reviewed. The risks                            and benefits of the procedure and the sedation                            options and risks were discussed with the patient.                            All questions were answered, and informed consent                            was obtained. Prior Anticoagulants: The patient has                            taken heparin, last dose was day of procedure. ASA                            Grade Assessment: III - A patient with severe                            systemic disease. After reviewing the risks and                            benefits, the patient was deemed in satisfactory  condition to undergo the procedure.                           After obtaining informed consent, the scope was                            passed under direct vision. Throughout the                            procedure, the patient's blood pressure, pulse, and                            oxygen saturations were monitored  continuously. The                            TJF-Q180V ([id number] Olympus duodenoscope was                            introduced through the mouth, and used to inject                            contrast into and used to inject contrast into the                            ventral pancreatic duct. The ERCP was extremely                            difficult due to challenging cannulation.                            Successful completion of the procedure was aided by                            performing the maneuvers documented (below) in this                            report. The patient tolerated the procedure. Scope In: Scope Out: Findings:      The scout film was normal.      The upper GI tract was traversed under direct vision without detailed       examination. Diffuse severe inflammation characterized by congestion       (edema), erythema, friability and granularity was found in the duodenal       bulb, in the first portion of the duodenum, in the second portion of the       duodenum, in the area of the papilla, in the area of the minor papilla       and in the third portion of the duodenum. After 10 minutes of trying to       find the region using Fluroscopy as a map, I saw that the major papilla       was small and congested.      Initial attempts at cannulation were not successful of the biliary or       pancreatic systems. The ventral pancreatic duct could not be cannulated       with the  Autotome sphincterotome with standard 0.035 Jagwire. I switched       to an angled 0.035 Jagwire and then an angled Revolution Jagwire and was       not successful at passage. I proceeded with placement of the       sphincterotome catheter into the presumed orifice and a superficial       cannulation of and contrast injection into the ventral pancreatic duct       alone was accomplished with the Autotome sphincterotome. I personally       interpreted the pancreatic duct images. Ductal flow of  contrast was       adequate. Image quality was adequate. Contrast extended to the       pancreatic duct. Segmental irregularity of the pancreatic duct was seen       in the ventral pancreatic duct in the head/neck region of the pancreas       and pancreatic duct. I did not see an overt extravasation of contrast in       the entire opacified area. I then proceeded with attempt at wire       placement having the pancreatogram as a guide. Using the Angled tip and       swirl of the Revoultion Jagwire, a short 0.025 inch Antonietta Breach was passed       into the ventral pancreatic duct into the pancreatic body. The ventral       pancreatic duct was then deeply cannulated with the Autotome       sphincterotome. Contrast was injected and did not note an overt       pancreatic leak. A 4 mm ventral pancreatic sphincterotomy was made with       a monofilament Autotome sphincterotome using ERBE electrocautery. There       was no post-sphincterotomy bleeding. I decided, due to the clinical       course and concern for leak, though I could not visualize one in what       had previously been reported as the disruption in the neck region, one 5       Fr by 7 cm plastic pancreatic stent with two external flaps and a single       internal flap was placed into the ventral pancreatic duct. Clear fluid       flowed through the stent. The stent was in good position.      The duodenoscope was withdrawn from the patient. Impression:               - Severe Duodenitis noted.                           - The major papilla appeared to be small and                            congested.                           - After significant difficulty with cannulation, a                            superficial injection showed access of the  pancreatic tree and then a wire was able to be                            placed into the pancreatic body. An irregularity                            was found in the the  pancreatic duct in the                            head/neck region. Overt leakage/extravasation was                            not noted however.                           - A pancreatic sphincterotomy was performed.                           - One plastic pancreatic stent was placed into the                            ventral pancreatic duct due to concern of leak.                            Looking at things at the end of case, a potentially                            longer 9 cm stent likely would have been able to                            placed.                           - A leak in the distal region of the pancreatic                            tail could have been missed, but nothing suggested                            this based on fluoroscopy today. Moderate Sedation:      Not Applicable - Patient had care per Anesthesia. Recommendation:           - The patient will be observed post-procedure,                            until all discharge criteria are met.                           - Return patient to hospital ward for ongoing care.                           - Observe patient's clinical course.                           -  Watch for pancreatitis, bleeding, perforation,                            and cholangitis.                           - Repeat ERCP in 1-2 months to exchange stent, if                            patient does well.                           - Would recommend that ascites fluid be sampled. I                            query, whether the patient may actually have had                            development of ascites from his PV thrombus and                            significant liver insufficiency from that.                            Pancreatic leak still possible but not visualized                            on pancreatogram today. When ascites is sampled,                            would proceed with fluid cell                             count/culture/amylase/lipase/LDH/glucose.                           - If concern for issues with stent, such that a                            longer stent should be considered, then would                            recommend trying to place wire into the PD                            alongside the currently placed stent so as to not                            increase issues with deep wire placement and                            consider a 9 cm stent next time. I will be away for  the next 1-week, so please reach out if I can be of                            assistance to the ERCPist oncall. I will be happy                            to follow this patient with his primary GI moving                            forward as a Optometrist.                           - GI Inpatient team to continue to follow.                           - May restart heparin _0  on 12/24 without bolus,                            in effort of decreasing risk of post-sphincterotomy                            bleeding.                           - Recommend H. pylori serology and treatment if                            positive.                           - Continue PPI IV until able to take PO.                           - Recommend at this point enteral nutrition via                            Cortrak to optimize healing.                           - The findings and recommendations were discussed                            with the patient.                           - The findings and recommendations were discussed                            with the referring physician. Procedure Code(s):        --- Professional ---                           [phone], Endoscopic retrograde  cholangiopancreatography (ERCP); with placement of                            endoscopic stent into biliary or pancreatic duct,                            including pre- and post-dilation and  guide wire                            passage, when performed, including sphincterotomy,                            when performed, each stent Diagnosis Code(s):        --- Professional ---                           K29.80, Duodenitis without bleeding                           K86.89, Other specified diseases of pancreas                           K83.8, Other specified diseases of biliary tract                           R93.5, Abnormal findings on diagnostic imaging of                            other abdominal regions, including retroperitoneum                           R93.2, Abnormal findings on diagnostic imaging of                            liver and biliary tract CPT copyright 2019 [employer] Association. All rights reserved. The codes documented in this report are preliminary and upon coder review may  be revised to meet current compliance requirements. Justice Britain, MD 12/11/2019 4:27:54 PM Number of Addenda: 0

## 2019-12-11 NOTE — Anesthesia Preprocedure Evaluation (Addendum)
Anesthesia Evaluation  Patient identified by MRN, date of birth, ID band Patient awake    Reviewed: Allergy & Precautions, NPO status , Patient's Chart, lab work & pertinent test results  Airway Mallampati: II  TM Distance: >3 FB Neck ROM: Full    Dental  (+) Teeth Intact, Dental Advisory Given   Pulmonary Current Smoker,    breath sounds clear to auscultation       Cardiovascular  Rhythm:Regular Rate:Normal     Neuro/Psych    GI/Hepatic   Endo/Other    Renal/GU      Musculoskeletal   Abdominal   Peds  Hematology   Anesthesia Other Findings   Reproductive/Obstetrics                             Anesthesia Physical Anesthesia Plan  ASA: III  Anesthesia Plan: General   Post-op Pain Management:    Induction: Intravenous, Rapid sequence and Cricoid pressure planned  PONV Risk Score and Plan: Ondansetron and Dexamethasone  Airway Management Planned: Oral ETT  Additional Equipment:   Intra-op Plan:   Post-operative Plan: Extubation in OR  Informed Consent: I have reviewed the patients History and Physical, chart, labs and discussed the procedure including the risks, benefits and alternatives for the proposed anesthesia with the patient or authorized representative who has indicated his/her understanding and acceptance.     Dental advisory given  Plan Discussed with: CRNA and Anesthesiologist  Anesthesia Plan Comments:         Anesthesia Quick Evaluation

## 2019-12-11 NOTE — Transfer of Care (Signed)
Immediate Anesthesia Transfer of Care Note  Patient: Paul Allen  Procedure(s) Performed: ENDOSCOPIC RETROGRADE CHOLANGIOPANCREATOGRAPHY (ERCP) (N/A ) PANCREATIC STENT PLACEMENT SPHINCTEROTOMY  Patient Location: Endoscopy Unit  Anesthesia Type:General  Level of Consciousness: awake and patient cooperative  Airway & Oxygen Therapy: Patient Spontanous Breathing and Patient connected to face mask oxygen  Post-op Assessment: Report given to RN and Post -op Vital signs reviewed and stable  Post vital signs: Reviewed and stable  Last Vitals:  Vitals Value Taken Time  BP 136/99 12/11/19 1604  Temp 37.2 C 12/11/19 1604  Pulse 100 12/11/19 1607  Resp 15 12/11/19 1607  SpO2 98 % 12/11/19 1607  Vitals shown include unvalidated device data.  Last Pain:  Vitals:   12/11/19 1604  TempSrc: Temporal  PainSc: 0-No pain      Patients Stated Pain Goal: 0 (41/28/78 6767)  Complications: No apparent anesthesia complications

## 2019-12-11 NOTE — Progress Notes (Signed)
ANTICOAGULATION CONSULT NOTE - Follow Up Consult  Pharmacy Consult for Heparin Indication: portal vein thrombosis   Allergies  Allergen Reactions  . Penicillins Rash    Has patient had a PCN reaction causing immediate rash, facial/tongue/throat swelling, SOB or lightheadedness with hypotension: yes Has patient had a PCN reaction causing severe rash involving mucus membranes or skin necrosis: Yes Has patient had a PCN reaction that required hospitalization: No Has patient had a PCN reaction occurring within the last 10 years: No If all of the above answers are "NO", then may proceed with Cephalosporin use.    Patient Measurements: Height: 5\' 9"  (175.3 cm) Weight: 184 lb 12.8 oz (83.8 kg) IBW/kg (Calculated) : 70.7 Heparin Dosing Weight:   Vital Signs: Temp: 98.9 F (37.2 C) (12/23 1604) Temp Source: Temporal (12/23 1604) BP: 137/83 (12/23 1620) Pulse Rate: 80 (12/23 1620)  Labs: Recent Labs    12/09/19 0453 12/10/19 0547 12/10/19 1318 12/10/19 2131 12/11/19 0531  HGB 13.7 15.4  --   --  14.7  HCT 41.7 47.0  --   --  44.7  PLT 238 277  --   --  272  HEPARINUNFRC 0.33 0.28* 0.42 0.51 0.65  CREATININE 0.39* 0.59*  --   --  0.55*   Estimated Creatinine Clearance: 124 mL/min (A) (by C-G formula based on SCr of 0.55 mg/dL (L)).  Medications:  Infusions:  . sodium chloride    . [MAR Hold] sodium chloride 1,000 mL (12/10/19 1522)  . lactated ringers 800 mL/hr at 12/11/19 1602  . [MAR Hold] meropenem (MERREM) IV 1 g (12/11/19 1106)   Assessment: Pharmacy is consulted to dose heparin in 38 yo male diagnosed with portal vein thrombosis. CT done on 12/17 shows "venous thrombosis which is extensive in the SMV, main portal, left portal and right portal. There is still some patency of main portal vein and right portal branches."   Goal of Therapy:  Heparin level 0.3-0.7 units/ml Monitor platelets by anticoagulation protocol: Yes   Today, 12/11/2019 0547 Hep level 0.28  units/ml, below therapeutic range, rate increased to 2000 units/hr 1328 Hep level 0.42 units/ml, in range 2131 HL = 0.51 at goal, no problems reported by. RN. ERCP scheduled for 12/23  2nd shift follow up: Patient s/p ERCP.  Dr Jon Billings op note states: "May restart heparin @0000  on 12/24 without bolus, in effort of decreasing risk of post-sphincterotomy bleeding."   Plan:   At 00:00 on 12/24 restart IV heparin infusion @ 2000 units/hr  Check heparin level 6 hr after heparin resumed.  Daily CBC while on heparin drip  Monitor for signs and symptoms of bleeding   12/11/2019 4:43 PM  Brogen Duell, Toribio Harbour PharmD

## 2019-12-12 DIAGNOSIS — K8511 Biliary acute pancreatitis with uninfected necrosis: Secondary | ICD-10-CM

## 2019-12-12 DIAGNOSIS — K2980 Duodenitis without bleeding: Secondary | ICD-10-CM

## 2019-12-12 DIAGNOSIS — K859 Acute pancreatitis without necrosis or infection, unspecified: Secondary | ICD-10-CM

## 2019-12-12 DIAGNOSIS — K2970 Gastritis, unspecified, without bleeding: Secondary | ICD-10-CM

## 2019-12-12 DIAGNOSIS — R1013 Epigastric pain: Secondary | ICD-10-CM

## 2019-12-12 LAB — COMPREHENSIVE METABOLIC PANEL
ALT: 38 U/L (ref 0–44)
AST: 20 U/L (ref 15–41)
Albumin: 2.7 g/dL — ABNORMAL LOW (ref 3.5–5.0)
Alkaline Phosphatase: 63 U/L (ref 38–126)
Anion gap: 11 (ref 5–15)
BUN: <5 mg/dL — ABNORMAL LOW (ref 6–20)
CO2: 28 mmol/L (ref 22–32)
Calcium: 8.4 mg/dL — ABNORMAL LOW (ref 8.9–10.3)
Chloride: 98 mmol/L (ref 98–111)
Creatinine, Ser: 0.5 mg/dL — ABNORMAL LOW (ref 0.61–1.24)
GFR calc Af Amer: >60 mL/min (ref >60)
GFR calc non Af Amer: >60 mL/min (ref >60)
Glucose, Bld: 95 mg/dL (ref 70–99)
Potassium: 4 mmol/L (ref 3.5–5.1)
Sodium: 137 mmol/L (ref 135–145)
Total Bilirubin: 0.6 mg/dL (ref 0.3–1.2)
Total Protein: 5.4 g/dL — ABNORMAL LOW (ref 6.5–8.1)

## 2019-12-12 LAB — CBC
HCT: 41.7 % (ref 39.0–52.0)
Hemoglobin: 13.8 g/dL (ref 13.0–17.0)
MCH: 31.9 pg (ref 26.0–34.0)
MCHC: 33.1 g/dL (ref 30.0–36.0)
MCV: 96.5 fL (ref 80.0–100.0)
Platelets: 255 10*3/uL (ref 150–400)
RBC: 4.32 MIL/uL (ref 4.22–5.81)
RDW: 13.5 % (ref 11.5–15.5)
WBC: 5.9 10*3/uL (ref 4.0–10.5)
nRBC: 0 % (ref 0.0–0.2)

## 2019-12-12 LAB — HEPARIN LEVEL (UNFRACTIONATED)
Heparin Unfractionated: 0.24 IU/mL — ABNORMAL LOW (ref 0.30–0.70)
Heparin Unfractionated: 0.9 IU/mL — ABNORMAL HIGH (ref 0.30–0.70)
Heparin Unfractionated: 0.76 IU/mL — ABNORMAL HIGH (ref 0.30–0.70)

## 2019-12-12 LAB — MAGNESIUM: Magnesium: 1.8 mg/dL (ref 1.7–2.4)

## 2019-12-12 MED ORDER — HEPARIN (PORCINE) 25000 UT/250ML-% IV SOLN
2000.0000 [IU]/h | INTRAVENOUS | Status: DC
  Administered 2019-12-12: 21:00:00 2000 [IU]/h via INTRAVENOUS
  Filled 2019-12-12 (×2): qty 250

## 2019-12-12 MED ORDER — HEPARIN (PORCINE) 25000 UT/250ML-% IV SOLN
2100.0000 [IU]/h | INTRAVENOUS | Status: DC
  Administered 2019-12-12: 09:00:00 2100 [IU]/h via INTRAVENOUS
  Filled 2019-12-12 (×2): qty 250

## 2019-12-12 MED ORDER — HEPARIN (PORCINE) 25000 UT/250ML-% IV SOLN
1900.0000 [IU]/h | INTRAVENOUS | Status: DC
  Filled 2019-12-12 (×2): qty 250

## 2019-12-12 NOTE — Anesthesia Postprocedure Evaluation (Signed)
Anesthesia Post Note  Patient: Paul Allen  Procedure(s) Performed: ENDOSCOPIC RETROGRADE CHOLANGIOPANCREATOGRAPHY (ERCP) (N/A ) PANCREATIC STENT PLACEMENT SPHINCTEROTOMY     Patient location during evaluation: PACU Anesthesia Type: General Level of consciousness: awake Pain management: pain level controlled Vital Signs Assessment: post-procedure vital signs reviewed and stable Respiratory status: spontaneous breathing Cardiovascular status: stable Postop Assessment: no apparent nausea or vomiting Anesthetic complications: no    Last Vitals:  Vitals:   12/11/19 2122 12/12/19 1234  BP: 120/83 129/89  Pulse: (!) 52 (!) 58  Resp: 18 16  Temp: 36.6 C 36.8 C  SpO2: 99% 95%    Last Pain:  Vitals:   12/12/19 1234  TempSrc: Oral  PainSc:    Pain Goal: Patients Stated Pain Goal: 0 (12/06/19 1502)                 Huston Foley

## 2019-12-12 NOTE — Progress Notes (Addendum)
Progress Note    ASSESSMENT AND PLAN:    Acute severe necrotizing pancreatitis ( [place]) complicated by large fluid collections, PD disruption and SMV / PV thrombosis. Yesterday he underwent ERCP. No leakage / extravasation of contrast from the irregular appearing PD in the head / neck region. A pancreatic sphincterotomy was done and a plastic stent placed into ventral PD. --He feels better and actually taking clears. Will advance to full liquids, will hold off on Cortrak for nutrition. Holding off on paracentesis as well.  --am cbc, cmet, lipase --Will need PD stent exchange in 1-2 months.      SUBJECTIVE    Feel okay, no complaints. Tolerating clears  OBJECTIVE:    ERCP 12/11/19 Severe Duodenitis noted. - The major papilla appeared to be small and congested. - After significant difficulty with cannulation, a superficial injection showed access of the pancreatic tree and then a wire was able to be placed into the pancreatic body. An irregularity was found in the the pancreatic duct in the head/neck region. Overt leakage/extravasation was not noted however. - A pancreatic sphincterotomy was performed. - One plastic pancreatic stent was placed into the ventral pancreatic duct due to concern ofleak. Looking at things at the end of case, a potentially longer 9 cm stent likely would have been able to placed. - A leak in the distal region of the pancreatic tail could have been missed, but nothing suggested this based on fluoroscopy today.  The patient will be observed post-procedure, until all discharge criteria are met. - Return patient to hospital ward for ongoing care. - Observe patient's clinical course. - Watch for pancreatitis, bleeding, perforation, and cholangitis. - Repeat ERCP in 1-2 months to exchange stent, if patient does well. - Would recommend that ascites fluid be sampled. I query, whether the patient may actually have had development of ascites from his PV  thrombus and significant liver insufficiency from that. Pancreatic leak still possible but not visualized on pancreatogram today. When ascites is sampled, would proceed with fluid cell count/culture/amylase/lipase/LDH/glucose. - If concern for issues with stent, such that a longer stent should be considered, then would recommend trying to place wire into the PD alongside the currently placed stent so as to not increase issues with deep wire placement and consider a 9 cm stent next time. I will be away for the next 1-week, so please reach out if I can be of assistance to the ERCPist oncall. I will be happy to follow this patient with his primary GI moving forward as a Optometrist. - GI Inpatient team to continue to follow. - May restart heparin '@0000'  on 12/24 without bolus, in effort of decreasing risk of post-sphincterotomy bleeding. - Recommend H. pylori serology and treatment if positive. - Continue PPI IV until able to take PO. - Recommend at this point enteral nutrition via Cortrak to optimize healing. - The findings and recommendations were discussed with the patient. - The findings and recommendations were discussed with the referring physician  Vital signs in last 24 hours: Temp:  [97.9 F (36.6 C)-98.9 F (37.2 C)] 97.9 F (36.6 C) (12/23 2122) Pulse Rate:  [52-99] 52 (12/23 2122) Resp:  [12-25] 18 (12/23 2122) BP: (120-160)/(83-99) 120/83 (12/23 2122) SpO2:  [97 %-99 %] 99 % (12/23 2122) Last BM Date: 12/11/19 General:   Alert, well-developed male in NAD EENT:  Normal hearing, non icteric sclera   Heart:  Regular rate and rhythm;  No lower extremity edema   Pulm: Normal respiratory  effort   Abdomen:  Soft, mildly distended, mild diffuse tenderness.  Normal bowel sounds.          Neurologic:  Alert and  oriented x4;  grossly normal neurologically. Psych:  Pleasant, cooperative.  Normal mood and affect.   Intake/Output from previous day: 12/23 0701 - 12/24 0700 In: 2257.3  [P.O.:960; I.V.:1290.4; IV Piggyback:6.8] Out: 600 [Urine:600] Intake/Output this shift: No intake/output data recorded.  Lab Results: Recent Labs    12/10/19 0547 12/11/19 0531 12/12/19 0535  WBC 8.0 5.6 5.9  HGB 15.4 14.7 13.8  HCT 47.0 44.7 41.7  PLT 277 272 255   BMET Recent Labs    12/10/19 0547 12/11/19 0531 12/12/19 0535  NA 136 139 137  K 3.6 3.7 4.0  CL 94* 100 98  CO2 '27 30 28  ' GLUCOSE 98 117* 95  BUN <5* <5* <5*  CREATININE 0.59* 0.55* 0.50*  CALCIUM 8.5* 8.3* 8.4*   LFT Recent Labs    12/12/19 0535  PROT 5.4*  ALBUMIN 2.7*  AST 20  ALT 38  ALKPHOS 63  BILITOT 0.6   PT/INR No results for input(s): LABPROT, INR in the last 72 hours. Hepatitis Panel No results for input(s): HEPBSAG, HCVAB, HEPAIGM, HEPBIGM in the last 72 hours.  DG ERCP With Sphincterotomy  Result Date: 12/11/2019 CLINICAL DATA:  Pancreatic duct leak. EXAM: ERCP TECHNIQUE: Multiple spot images obtained with the fluoroscopic device and submitted for interpretation post-procedure. FLUOROSCOPY TIME:  Fluoroscopy Time:  6 minutes and 47 seconds Number of Acquired Spot Images: 21 COMPARISON:  CT 12/08/2019 FINDINGS: Pancreatic duct was cannulated and opacified with contrast. No overt contrast extravasation from the pancreatic duct. Mild irregularity of the pancreatic duct along the head / neck region. Wire was advanced into the pancreatic duct and a pancreatic stent was placed. IMPRESSION: Placement of pancreatic stent. These images were submitted for radiologic interpretation only. Please see the procedural report for the amount of contrast and the fluoroscopy time utilized. Electronically Signed   By: Markus Daft M.D.   On: 12/11/2019 16:36   Active Problems:   Pancreatitis, acute   Elevated LFTs   Elevated hemoglobin (HCC)   Pancreatic ascites   Abdominal pain, epigastric   Portal vein thrombosis   Bile leak   Choledocholithiasis     LOS: 9 days   Tye Savoy ,NP 12/12/2019,  9:01 AM  Attending physician's note   I have taken an interval history, reviewed the chart and examined the patient. I agree with the Advanced Practitioner's note, impression and recommendations.   Doing much better this morning.  Tolerating clear liquids without any problems.  Would like to advance diet.  His abdominal pain and distention is much better. Appreciate Dr. Donneta Romberg effort yesterday with ERCP. S/P pancreatic sphincterotomy, 5Fr 7 cm placed in the pancreatic duct.  No obvious leaks/PD disruption.  Plan: -Advance diet to full liquid diet.  Will gradually advance as tolerated. -If unable to tolerate p.o., then would ask radiology for post duodenal Cortrak. -Check labs in a.m. -Ambulate -Continue heparin.  Once able to tolerate p.o., would switch to DOAC. -Holding off on paracentesis at this time -Continue supportive care. -Needs PD stent change in 1 to 2 months. -D/W nursing staff.    Carmell Austria, MD Velora Heckler Fabienne Bruns [phone].

## 2019-12-12 NOTE — Progress Notes (Addendum)
PROGRESS NOTE    [name]  VFI:[id number] DOB: Feb 29, 1981 DOA: 12/02/2019 PCP: Patient, No Pcp Per    Brief Narrative:38 year old Caucasian male with history of alcohol abuse admitted with acute alcoholic pancreatitis complicated with pancreatic duct rupture and extensive portal vein and SMV thrombosis  Assessment & Plan:   Active Problems:   Pancreatitis, acute   Elevated LFTs   Elevated hemoglobin (HCC)   Pancreatic ascites   Abdominal pain, epigastric   Portal vein thrombosis   Bile leak   Choledocholithiasis   #1 acute necrotizing pancreatitis with main pancreatic duct rupture/ SMV thrombosis and portal vein thrombosis likely secondary to alcohol abuse. -Status post repeat ERCP on 12/11/2019. No leakage / extravasation of contrast from the irregular appearing PD in the head / neck region.  Also status post sphincterotomy and a plastic stent placed into ventral PD. -Postprocedure patient is doing significantly better -Tolerated clear liquid diet well: And advance to full liquid diet per GI Continue IV fluids Continue pain management Dilaudid and oxycodone Continue Zofran Continue octreotide and meropenem  #2 extensive SMV thrombosis and portal vein thrombosis: - on heparin drip pharmacy  #3 scrotal edema: improving - improving with support and elevating the leg.   -May use sling -Discontinue IV hydration as his p.o. intake improves.  Currently on lactated Ringer 50 cc/h only.  #4 elevated LFTs: Resolved now  -  likely secondary to #1 and #2  #5 hypokalemia: Resolved   #6 QT prolongation: No acute issues S/S - QT 482 from 450:  - Changed Zofran to as needed; may d/c  - May repeat EKG     Nutrition Problem: Inadequate oral intake Etiology: inability to eat  Interventions: Refer to RD note for recommendations  Estimated body mass index is 27.29 kg/m as calculated from the following:   Height as of this encounter: 5\' 9"  (1.753 m).   Weight as of  this encounter: 83.8 kg.  DVT prophylaxis: Heparin drip  code Status: Full code Family Communication: None Disposition Plan: Pending clinical improvement  Consultants:   GI and general surgery  Procedures: ERCP Antimicrobials: Meropenem  Subjective: Patient says he is feeling much better this morning.  Very minimal to none abdominal pain.  He tolerated clear liquid diet well.  And advance to full liquid diet this morning.  Patient denies any fever.  And he is lower extremity and scrotal swelling have also been improving significantly.  Objective: Vitals:   12/11/19 1610 12/11/19 1620 12/11/19 2122 12/12/19 1234  BP: (!) 143/87 137/83 120/83 129/89  Pulse: 89 80 (!) 52 (!) 58  Resp: 20 19 18 16   Temp:   97.9 F (36.6 C) 98.2 F (36.8 C)  TempSrc:   Oral Oral  SpO2: 97% 97% 99% 95%  Weight:      Height:        Intake/Output Summary (Last 24 hours) at 12/12/2019 1551 Last data filed at 12/12/2019 1519 Gross per 24 hour  Intake 2943.89 ml  Output 600 ml  Net 2343.89 ml   Filed Weights   12/02/19 1933 12/07/19 0609  Weight: 74.4 kg 83.8 kg    Examination:  General exam: Appears calm and comfortable  Respiratory system: Clear to auscultation. Respiratory effort normal. Cardiovascular system: S1 & S2 heard, RRR. No JVD, murmurs, rubs, gallops or clicks. No pedal edema. Gastrointestinal system: Abdomen is distended, soft and nontender. No organomegaly or masses felt. Normal bowel sounds heard. Central nervous system: Alert and oriented. No focal neurological deficits. Extremities:  Mild swelling above knees Skin: No rashes, lesions or ulcers Psychiatry: Judgement and insight appear normal. Mood & affect appropriate.     Data Reviewed: I have personally reviewed following labs and imaging studies  CBC: Recent Labs  Lab 12/08/19 0505 12/09/19 0453 12/10/19 0547 12/11/19 0531 12/12/19 0535  WBC 5.7 4.3 8.0 5.6 5.9  HGB 14.0 13.7 15.4 14.7 13.8  HCT 42.8  41.7 47.0 44.7 41.7  MCV 97.5 97.2 98.9 98.9 96.5  PLT 254 238 277 272 255   Basic Metabolic Panel: Recent Labs  Lab 12/06/19 1238 12/08/19 0505 12/09/19 0453 12/09/19 1249 12/10/19 0547 12/11/19 0531 12/12/19 0535  NA 134* 137 137  --  136 139 137  K 3.2* 3.3* 3.5  --  3.6 3.7 4.0  CL 95* 96* 97*  --  94* 100 98  CO2 27 30 28   --  27 30 28   GLUCOSE 70 90 98  --  98 117* 95  BUN 6 <5* <5*  --  <5* <5* <5*  CREATININE 0.38* 0.42* 0.39*  --  0.59* 0.55* 0.50*  CALCIUM 8.1* 8.1* 8.1*  --  8.5* 8.3* 8.4*  MG 1.6*  --   --  1.6*  --   --  1.8   GFR: Estimated Creatinine Clearance: 124 mL/min (A) (by C-G formula based on SCr of 0.5 mg/dL (L)). Liver Function Tests: Recent Labs  Lab 12/08/19 0505 12/09/19 0453 12/10/19 0547 12/11/19 0531 12/12/19 0535  AST 25 23 24 27 20   ALT 69* 57* 52* 46* 38  ALKPHOS 77 66 73 66 63  BILITOT 1.0 1.3* 1.2 0.9 0.6  PROT 5.3* 4.9* 5.7* 5.5* 5.4*  ALBUMIN 2.5* 2.5* 2.8* 2.7* 2.7*   No results for input(s): LIPASE, AMYLASE in the last 168 hours. No results for input(s): AMMONIA in the last 168 hours. Coagulation Profile: No results for input(s): INR, PROTIME in the last 168 hours. Cardiac Enzymes: No results for input(s): CKTOTAL, CKMB, CKMBINDEX, TROPONINI in the last 168 hours. BNP (last 3 results) No results for input(s): PROBNP in the last 8760 hours. HbA1C: No results for input(s): HGBA1C in the last 72 hours. CBG: No results for input(s): GLUCAP in the last 168 hours. Lipid Profile: No results for input(s): CHOL, HDL, LDLCALC, TRIG, CHOLHDL, LDLDIRECT in the last 72 hours. Thyroid Function Tests: No results for input(s): TSH, T4TOTAL, FREET4, T3FREE, THYROIDAB in the last 72 hours. Anemia Panel: No results for input(s): VITAMINB12, FOLATE, FERRITIN, TIBC, IRON, RETICCTPCT in the last 72 hours. Sepsis Labs: No results for input(s): PROCALCITON, LATICACIDVEN in the last 168 hours.  Recent Results (from the past 240 hour(s))    SARS CORONAVIRUS 2 (TAT 6-24 HRS) Nasopharyngeal Nasopharyngeal Swab     Status: None   Collection Time: 12/03/19 12:42 AM   Specimen: Nasopharyngeal Swab  Result Value Ref Range Status   SARS Coronavirus 2 NEGATIVE NEGATIVE Final    Comment: (NOTE) SARS-CoV-2 target nucleic acids are NOT DETECTED. The SARS-CoV-2 RNA is generally detectable in upper and lower respiratory specimens during the acute phase of infection. Negative results do not preclude SARS-CoV-2 infection, do not rule out co-infections with other pathogens, and should not be used as the sole basis for treatment or other patient management decisions. Negative results must be combined with clinical observations, patient history, and epidemiological information. The expected result is Negative. Fact Sheet for Patients: HairSlick.no Fact Sheet for Healthcare Providers: quierodirigir.com This test is not yet approved or cleared by the [place] FDA and  has been authorized for detection and/or diagnosis of SARS-CoV-2 by FDA under an Emergency Use Authorization (EUA). This EUA will remain  in effect (meaning this test can be used) for the duration of the COVID-19 declaration under Section 56 4(b)(1) of the Act, 21 U.S.C. section 360bbb-3(b)(1), unless the authorization is terminated or revoked sooner. Performed at [facility], [address] [place], [place] [address]          Radiology Studies: DG ERCP With Sphincterotomy  Result Date: 12/11/2019 CLINICAL DATA:  Pancreatic duct leak. EXAM: ERCP TECHNIQUE: Multiple spot images obtained with the fluoroscopic device and submitted for interpretation post-procedure. FLUOROSCOPY TIME:  Fluoroscopy Time:  6 minutes and 47 seconds Number of Acquired Spot Images: 21 COMPARISON:  CT 12/08/2019 FINDINGS: Pancreatic duct was cannulated and opacified with contrast. No overt contrast extravasation from the  pancreatic duct. Mild irregularity of the pancreatic duct along the head / neck region. Wire was advanced into the pancreatic duct and a pancreatic stent was placed. IMPRESSION: Placement of pancreatic stent. These images were submitted for radiologic interpretation only. Please see the procedural report for the amount of contrast and the fluoroscopy time utilized. Electronically Signed   By: Richarda Overlie M.D.   On: 12/11/2019 16:36        Scheduled Meds: . feeding supplement  1 Container Oral TID BM  . feeding supplement (PRO-STAT SUGAR FREE 64)  30 mL Oral TID  . folic acid  1 mg Oral Daily  . indomethacin  100 mg Rectal Once  . indomethacin  100 mg Rectal Once  . multivitamin with minerals  1 tablet Oral Daily  . nicotine  7 mg Transdermal Daily  . octreotide  100 mcg Subcutaneous Q8H  . pantoprazole (PROTONIX) IV  40 mg Intravenous Q12H  . thiamine  100 mg Oral Daily   Continuous Infusions: . sodium chloride 1,000 mL (12/10/19 1522)  . heparin 2,000 Units/hr (12/12/19 1504)  . lactated ringers 800 mL/hr at 12/11/19 1602  . meropenem (MERREM) IV Stopped (12/12/19 1358)     LOS: 9 days     Thomasenia Bottoms, MD [facility]  If 7PM-7AM, please contact night-coverage [email or web address] Password [facility] 12/12/2019, 3:51 PM

## 2019-12-12 NOTE — Progress Notes (Signed)
ANTICOAGULATION CONSULT NOTE - Follow Up Consult  Pharmacy Consult for Heparin Indication: portal vein thrombosis   Allergies  Allergen Reactions  . Penicillins Rash    Has patient had a PCN reaction causing immediate rash, facial/tongue/throat swelling, SOB or lightheadedness with hypotension: yes Has patient had a PCN reaction causing severe rash involving mucus membranes or skin necrosis: Yes Has patient had a PCN reaction that required hospitalization: No Has patient had a PCN reaction occurring within the last 10 years: No If all of the above answers are "NO", then may proceed with Cephalosporin use.    Patient Measurements: Height: 5\' 9"  (175.3 cm) Weight: 184 lb 12.8 oz (83.8 kg) IBW/kg (Calculated) : 70.7 Heparin Dosing Weight: n/a. Use TBW of 83 kg  Vital Signs: Temp: 97.9 F (36.6 C) (12/23 2122) Temp Source: Oral (12/23 2122) BP: 120/83 (12/23 2122) Pulse Rate: 52 (12/23 2122)  Labs: Recent Labs    12/10/19 0547 12/10/19 2131 12/11/19 0531 12/12/19 0535  HGB 15.4  --  14.7 13.8  HCT 47.0  --  44.7 41.7  PLT 277  --  272 255  HEPARINUNFRC 0.28* 0.51 0.65 0.24*  CREATININE 0.59*  --  0.55* 0.50*   Estimated Creatinine Clearance: 124 mL/min (A) (by C-G formula based on SCr of 0.5 mg/dL (L)).  Medications:  Infusions:  . sodium chloride 1,000 mL (12/10/19 1522)  . heparin 2,000 Units/hr (12/11/19 2358)  . lactated ringers 800 mL/hr at 12/11/19 1602  . meropenem (MERREM) IV 1 g (12/12/19 0542)   Assessment: Pharmacy is consulted to dose heparin in 38 yo male diagnosed with portal vein thrombosis. CT done on 12/17 shows "venous thrombosis which is extensive in the SMV, main portal, left portal and right portal. There is still some patency of main portal vein and right portal branches." Pt was not on anticoagulation PTA.  Significant Events: -12/18: ERCP -12/23: ERCP with pancreatic stent placement, sphincterotomy  Goal of Therapy:  Heparin level 0.3-0.7  units/ml Monitor platelets by anticoagulation protocol: Yes   Today, 12/12/2019  HL = 0.24 is subtherapeutic on heparin infusion to 2000 units/hr  Hgb/Plt - stable, WNL  Confirmed with RN that heparin infusing at correct rate with no interruptions. No signs/symptoms of bleeding.  Plan:  No bolus since patient had surgery yesterday  Increase heparin to 2100 units/hr  Check HL in 6 hours  CBC and HL daily   Monitor for signs/symptoms of bleeding  Lenis Noon, PharmD 12/12/19 8:11 AM

## 2019-12-12 NOTE — Progress Notes (Signed)
Pharmacy: Re- heparin  Patient's a 37 y.o M currently on heparin drip for portal vein thrombosis. Heparin level now back supra-therapeutic at 0.90 (goal 0.3-0.7) with rate adjusted to 2100 units/hr earlier today.  Per pt's RN, no issue with IV line or bleeding noted, and blood sample was collected on the correct arm.   Plan: - reduce heparin drip to 2000 units/hr - check 6 hr heparin level - monitor for s/s bleeding  Dia Sitter, PharmD, BCPS 12/12/2019 3:01 PM

## 2019-12-12 NOTE — Progress Notes (Signed)
Pharmacy: Re- heparin  Patient's a 38 y.o M currently on heparin drip for portal vein thrombosis. Heparin level now back supra-therapeutic at 0.76 (goal 0.3-0.7) with rate adjusted to 2000 units/hr earlier today.  Per pt's RN, no issue with IV line or bleeding noted, and blood sample was collected on the correct arm.   Plan: - reduce heparin drip to 1900 units/hr - check am HL - monitor for s/s bleeding  Dorrene German 12/12/2019 9:59 PM

## 2019-12-13 DIAGNOSIS — K8500 Idiopathic acute pancreatitis without necrosis or infection: Secondary | ICD-10-CM

## 2019-12-13 DIAGNOSIS — K8689 Other specified diseases of pancreas: Secondary | ICD-10-CM

## 2019-12-13 LAB — COMPREHENSIVE METABOLIC PANEL
ALT: 40 U/L (ref 0–44)
AST: 37 U/L (ref 15–41)
Albumin: 3.1 g/dL — ABNORMAL LOW (ref 3.5–5.0)
Alkaline Phosphatase: 68 U/L (ref 38–126)
Anion gap: 12 (ref 5–15)
BUN: <5 mg/dL — ABNORMAL LOW (ref 6–20)
CO2: 27 mmol/L (ref 22–32)
Calcium: 8.5 mg/dL — ABNORMAL LOW (ref 8.9–10.3)
Chloride: 98 mmol/L (ref 98–111)
Creatinine, Ser: 0.51 mg/dL — ABNORMAL LOW (ref 0.61–1.24)
GFR calc Af Amer: >60 mL/min (ref >60)
GFR calc non Af Amer: >60 mL/min (ref >60)
Glucose, Bld: 104 mg/dL — ABNORMAL HIGH (ref 70–99)
Potassium: 3.3 mmol/L — ABNORMAL LOW (ref 3.5–5.1)
Sodium: 137 mmol/L (ref 135–145)
Total Bilirubin: 0.8 mg/dL (ref 0.3–1.2)
Total Protein: 5.9 g/dL — ABNORMAL LOW (ref 6.5–8.1)

## 2019-12-13 LAB — CBC
HCT: 45.7 % (ref 39.0–52.0)
Hemoglobin: 14.6 g/dL (ref 13.0–17.0)
MCH: 31.6 pg (ref 26.0–34.0)
MCHC: 31.9 g/dL (ref 30.0–36.0)
MCV: 98.9 fL (ref 80.0–100.0)
Platelets: 234 10*3/uL (ref 150–400)
RBC: 4.62 MIL/uL (ref 4.22–5.81)
RDW: 13.9 % (ref 11.5–15.5)
WBC: 5.6 10*3/uL (ref 4.0–10.5)
nRBC: 0 % (ref 0.0–0.2)

## 2019-12-13 LAB — HEPARIN LEVEL (UNFRACTIONATED)
Heparin Unfractionated: 0.82 IU/mL — ABNORMAL HIGH (ref 0.30–0.70)
Heparin Unfractionated: 0.41 IU/mL (ref 0.30–0.70)

## 2019-12-13 LAB — MAGNESIUM: Magnesium: 1.8 mg/dL (ref 1.7–2.4)

## 2019-12-13 MED ORDER — POTASSIUM CHLORIDE CRYS ER 20 MEQ PO TBCR
40.0000 meq | EXTENDED_RELEASE_TABLET | Freq: Two times a day (BID) | ORAL | Status: AC
  Administered 2019-12-13 – 2019-12-14 (×2): 40 meq via ORAL
  Filled 2019-12-13 (×2): qty 2

## 2019-12-13 MED ORDER — HEPARIN (PORCINE) 25000 UT/250ML-% IV SOLN
1750.0000 [IU]/h | INTRAVENOUS | Status: DC
  Administered 2019-12-13 – 2019-12-15 (×4): 1750 [IU]/h via INTRAVENOUS
  Filled 2019-12-13 (×4): qty 250

## 2019-12-13 NOTE — Progress Notes (Signed)
Pharmacy - IV heparin  Assessment:    Please see note from Lavonia Drafts) Jefferson, PharmD earlier today for full details.  Briefly, 38 y.o. male on IV heparin for portal vein thrombosis.   Recent heparin levels repeatedly elevated despite infusion rate reductions, so heparin paused for 1 hr this AM and rate reduced further  Most recent heparin level now therapeutic at 0.41 units/ml on 1750 units/hr  Plan:   Continue heparin at 1750 units/hr  Recheck level with AM labs  Reuel Boom, PharmD, BCPS [phone] 12/13/2019, 6:16 PM

## 2019-12-13 NOTE — Progress Notes (Signed)
ANTICOAGULATION CONSULT NOTE - Follow Up Consult  Pharmacy Consult for Heparin Indication: portal vein thrombosis   Allergies  Allergen Reactions  . Penicillins Rash    Has patient had a PCN reaction causing immediate rash, facial/tongue/throat swelling, SOB or lightheadedness with hypotension: yes Has patient had a PCN reaction causing severe rash involving mucus membranes or skin necrosis: Yes Has patient had a PCN reaction that required hospitalization: No Has patient had a PCN reaction occurring within the last 10 years: No If all of the above answers are "NO", then may proceed with Cephalosporin use.    Patient Measurements: Height: 5\' 9"  (175.3 cm) Weight: 184 lb 12.8 oz (83.8 kg) IBW/kg (Calculated) : 70.7 Heparin Dosing Weight: n/a. Use TBW of 83 kg  Vital Signs: Temp: 98 F (36.7 C) (12/25 0621) Temp Source: Oral (12/25 0621) BP: 140/95 (12/25 0621) Pulse Rate: 51 (12/25 0621)  Labs: Recent Labs    12/11/19 0531 12/12/19 0535 12/12/19 1413 12/12/19 2035 12/13/19 0707  HGB 14.7 13.8  --   --  14.6  HCT 44.7 41.7  --   --  45.7  PLT 272 255  --   --  234  HEPARINUNFRC 0.65 0.24* 0.90* 0.76* 0.82*  CREATININE 0.55* 0.50*  --   --   --    Estimated Creatinine Clearance: 124 mL/min (A) (by C-G formula based on SCr of 0.5 mg/dL (L)).  Medications:  Infusions:  . sodium chloride 1,000 mL (12/10/19 1522)  . heparin    . lactated ringers 50 mL/hr at 12/12/19 1558  . meropenem (MERREM) IV 1 g (12/13/19 0322)   Assessment: Pharmacy is consulted to dose heparin in 38 yo male diagnosed with portal vein thrombosis. CT done on 12/17 shows "venous thrombosis which is extensive in the SMV, main portal, left portal and right portal. There is still some patency of main portal vein and right portal branches." Pt was not on anticoagulation PTA.  Significant Events: -12/18: ERCP -12/23: ERCP with pancreatic stent placement, sphincterotomy  Goal of Therapy:  Heparin level  0.3-0.7 units/ml Monitor platelets by anticoagulation protocol: Yes   Today, 12/13/2019  HL = 0.82 is remains supratherapeutic on heparin infusion despite rate decreases.  Currently on 1900 units/hr  Hgb/Plt - stable, WNL  Confirmed with RN that heparin infusing at correct rate with no interruptions. No signs/symptoms of bleeding.  Plan:  Hold heparin x1 hours then resume at heparin to 1750 units/hr  Recheck HL in 6 hours  CBC and HL daily   Monitor for signs/symptoms of bleeding  Netta Cedars, PharmD, BCPS 12/13/19 8:27 AM

## 2019-12-13 NOTE — Progress Notes (Addendum)
     Progress Note    ASSESSMENT AND PLAN:   1.  Acute severe necrotizing pancreatitis (ETOH) complicated by large fluid collection s/p ERCP with PD stenting 12/23 2.  SMV/PV thrombosis on heparin  Plan: -Advance diet to low-fat diet. -Ambulate -DC'd antibiotics -Recheck labs in a.m. -Once able to tolerate p.o., can change heparin to Eliquis.     SUBJECTIVE  Feels much better today Had some abdominal pain this morning, thought that it was because of ice cream/milk Better now No nausea, vomiting Having good bowel movements    OBJECTIVE:     Vital signs in last 24 hours: Temp:  [97.6 F (36.4 C)-98.2 F (36.8 C)] 98 F (36.7 C) (12/25 0621) Pulse Rate:  [51-58] 51 (12/25 0621) Resp:  [16-17] 17 (12/25 0621) BP: (129-151)/(89-95) 140/95 (12/25 0621) SpO2:  [95 %-98 %] 97 % (12/25 0621) Last BM Date: 12/12/19 General:   Alert, NAD EENT:  Normal hearing, non icteric sclera, conjunctive pink.  Heart:  Regular rate and rhythm; no murmur.  No lower extremity edema   Pulm: Normal respiratory effort, lungs CTA bilaterally without wheezes or crackles. Abdomen:  Soft, nondistended, nontender.  Normal bowel sounds,.       Neurologic:  Alert and  oriented x4;  grossly normal neurologically. Psych:  Pleasant, cooperative.  Normal mood and affect.   Intake/Output from previous day: 12/24 0701 - 12/25 0700 In: 2346.6 [P.O.:1400; I.V.:146.6; IV Piggyback:800] Out: 2250 [Urine:2250] Intake/Output this shift: Total I/O In: 1681.2 [P.O.:237; I.V.:1444.2] Out: 500 [Urine:500]  Lab Results: Recent Labs    12/11/19 0531 12/12/19 0535 12/13/19 0707  WBC 5.6 5.9 5.6  HGB 14.7 13.8 14.6  HCT 44.7 41.7 45.7  PLT 272 255 234   BMET Recent Labs    12/11/19 0531 12/12/19 0535 12/13/19 0707  NA 139 137 137  K 3.7 4.0 3.3*  CL 100 98 98  CO2 30 28 27   GLUCOSE 117* 95 104*  BUN <5* <5* <5*  CREATININE 0.55* 0.50* 0.51*  CALCIUM 8.3* 8.4* 8.5*   LFT Recent Labs      12/13/19 0707  PROT 5.9*  ALBUMIN 3.1*  AST 37  ALT 40  ALKPHOS 68  BILITOT 0.8   PT/INR No results for input(s): LABPROT, INR in the last 72 hours. Hepatitis Panel No results for input(s): HEPBSAG, HCVAB, HEPAIGM, HEPBIGM in the last 72 hours.  DG ERCP With Sphincterotomy  Result Date: 12/11/2019 CLINICAL DATA:  Pancreatic duct leak. EXAM: ERCP TECHNIQUE: Multiple spot images obtained with the fluoroscopic device and submitted for interpretation post-procedure. FLUOROSCOPY TIME:  Fluoroscopy Time:  6 minutes and 47 seconds Number of Acquired Spot Images: 21 COMPARISON:  CT 12/08/2019 FINDINGS: Pancreatic duct was cannulated and opacified with contrast. No overt contrast extravasation from the pancreatic duct. Mild irregularity of the pancreatic duct along the head / neck region. Wire was advanced into the pancreatic duct and a pancreatic stent was placed. IMPRESSION: Placement of pancreatic stent. These images were submitted for radiologic interpretation only. Please see the procedural report for the amount of contrast and the fluoroscopy time utilized. Electronically Signed   By: Markus Daft M.D.   On: 12/11/2019 16:36      LOS: 10 days     Carmell Austria, MD 12/13/2019, 10:56 AM [facility] [phone]

## 2019-12-13 NOTE — Progress Notes (Signed)
Pharmacy Antibiotic Note  Paul Allen is a 38 y.o. male admitted on 12/02/2019 with bile duct leak.  Pharmacy has been consulted for meropenem dosing.  12/13/2019:  Day#9 Meropenem  Afebrile, WBC WNL  Scr stable  No cx data  Plan:  Meropenem 1 gr IV q8h    Monitor clinical course  Duration of therapy per MD- consider d/c, narrow antibiotics once clinically appropriate   Height: 5\' 9"  (175.3 cm) Weight: 184 lb 12.8 oz (83.8 kg) IBW/kg (Calculated) : 70.7  Temp (24hrs), Avg:97.9 F (36.6 C), Min:97.6 F (36.4 C), Max:98.2 F (36.8 C)  Recent Labs  Lab 12/08/19 0505 12/09/19 0453 12/10/19 0547 12/11/19 0531 12/12/19 0535 12/13/19 0707  WBC 5.7 4.3 8.0 5.6 5.9 5.6  CREATININE 0.42* 0.39* 0.59* 0.55* 0.50*  --     Estimated Creatinine Clearance: 124 mL/min (A) (by C-G formula based on SCr of 0.5 mg/dL (L)).    Allergies  Allergen Reactions  . Penicillins Rash    Has patient had a PCN reaction causing immediate rash, facial/tongue/throat swelling, SOB or lightheadedness with hypotension: yes Has patient had a PCN reaction causing severe rash involving mucus membranes or skin necrosis: Yes Has patient had a PCN reaction that required hospitalization: No Has patient had a PCN reaction occurring within the last 10 years: No If all of the above answers are "NO", then may proceed with Cephalosporin use.     Antimicrobials this admission: 12/17 meropenem >>   Dose adjustments this admission:   Microbiology results: 12/15 COVID-19: Negative  12/15 HIV antibody: negative   Thank you for allowing pharmacy to be a part of this patient's care.  Netta Cedars, PharmD, BCPS 12/13/2019 8:32 AM

## 2019-12-13 NOTE — Progress Notes (Signed)
PROGRESS NOTE    Paul Allen  ZOX:[id number] DOB: 06/05/1981 DOA: 12/02/2019 PCP: Patient, No Pcp Per   Brief Narrative:  Patient is a 38 year old overweight Caucasian male with a past medical history significant for not limited to alcohol abuse who presents with abdominal pain that is intermittent with epigastric discomfort after he was diagnosed with acute pancreatitis in outpatient setting.  He was admitted here with acute alcoholic pancreatitis and was complicated by pancreatic duct disruption and extensive portal vein and SMV thrombosis.  He underwent ERCP on 12/11/2019 which showed no leakage or extravasation of contrast from irregular-appearing pancreatic duct in the head neck region.  He is also status post enterotomy and plastic stent placement into the ventral pancreatic duct and post procedure he felt better.  Diet is being advanced and he was placed on the full liquid diet yesterday and being advanced to soft diet today.  He was placed on IV meropenem and now this will be stopped by GI.  Assessment & Plan:   Active Problems:   Pancreatitis, acute   Elevated LFTs   Elevated hemoglobin (HCC)   Pancreatic ascites   Abdominal pain, epigastric   Portal vein thrombosis   Bile leak   Choledocholithiasis   Pancreatic duct leak   Acute necrotizing pancreatitis with main pancreatic duct disruption/SMV thrombosis and portal vein thrombosis likely secondary to alcohol abuse. -Status post repeat ERCP on 12/11/2019. No leakage / extravasation of contrast from the irregular appearing PD in the head / neck region.  Also status post sphincterotomy and a plastic stent placed into ventral PD. -Postprocedure patient is doing significantly better -Diet is being advanced by GI and Dr. Chales Abrahams has placed the patient on a Soft Diet -Continued IV fluids with LR at 50 mL/hr but will now D/C as his diet is advanced -Continue pain management IV Hydromorphone 1 mg q4hprn Severe Pain and Oxycodone 10  mg po q6hprn -C/w Nutritional Supplements with Boost Breeze TID and 30 mL Prostat TID  -Continue Ondansetron 4 mg po q6hprn Nausea  -Continue Octreotide 100 mcg sq Injections q8h -IV Meropenem now stopped by GI -C/w PPI 40 mg IV BID and Antiemetics with po Zofran 4 mg q6hprn Nausea  Extensive SMV thrombosis and portal vein thrombosis -On heparin drip with Pharmacy to dose; Will require transition to a NOAC Eliquis prior to D/C  Scrotal Edema -improving with support and elevating the leg.   -May use sling -Discontinue IV hydration as his p.o. intake improves.  Currently on lactated Ringer 50 cc/h only and will stop now -C/w Ambulation   Elevated LFTs/Abnormal LFTs -Resolved now; AST is now 37 and ALT is now 40 -Likely secondary to #1 and #2 -Continue to Monitor and Trend -Repeat CMP in AM   Hypokalemia -Mild at 3.3 -Replete with po Potassium Chloride 40 mEQ BID x2 -Continue to Monitor and Replete as Necessary -Repeat CMP in AM   QT prolongation -No Issues  -Changed Zofran to as needed; may d/c  -QTc was 473 -Repeat EKG in AM    Hyperglycemia -Patient's blood sugar has been ranging from 95-117 -Check hemoglobin A1c in a.m. -Continue to monitor trend blood sugars carefully and if necessary will place on sensitive NovoLog/scale insulin AC  DVT prophylaxis: Anticoagulated with a Heparin gtt Code Status: FULL CODE Family Communication: No family present at bedside  Disposition Plan: Anticipate D/C Home in the next 24-48 hours  Consultants:   General Surgery  Gastroenterology    Procedures:    Antimicrobials:  Anti-infectives (From admission, onward)   Start     Dose/Rate Route Frequency Ordered Stop   12/05/19 1200  meropenem (MERREM) 1 g in sodium chloride 0.9 % 100 mL IVPB  Status:  Discontinued     1 g 200 mL/hr over 30 Minutes Intravenous Every 8 hours 12/05/19 1117 12/13/19 1055     Subjective: Seen and examined at bedside he has been ambulating  the halls and states that he had some abdominal discomfort last night after drinking milk products but he states he is feeling good today and having bowel movements.  Tolerated full liquid diet without issues.  Denies any current pain.  Feels well and thinks he is improved.  No other concerns requested this time.  Objective: Vitals:   12/12/19 1234 12/12/19 2226 12/13/19 0621 12/13/19 1357  BP: 129/89 (!) 151/92 (!) 140/95 (!) 139/96  Pulse: (!) 58 (!) 54 (!) 51 (!) 54  Resp: 16 17 17 18   Temp: 98.2 F (36.8 C) 97.6 F (36.4 C) 98 F (36.7 C) 98.1 F (36.7 C)  TempSrc: Oral Oral Oral Oral  SpO2: 95% 98% 97% 95%  Weight:      Height:        Intake/Output Summary (Last 24 hours) at 12/13/2019 1543 Last data filed at 12/13/2019 1331 Gross per 24 hour  Intake 3141.17 ml  Output 2850 ml  Net 291.17 ml   Filed Weights   12/02/19 1933 12/07/19 0609  Weight: 74.4 kg 83.8 kg   Examination: Physical Exam:  Constitutional: WN/WD overweight Caucasian male currently in NAD and appears calm  Eyes: Lids and conjunctivae normal, sclerae anicteric  ENMT: External Ears, Nose appear normal. Grossly normal hearing. Neck: Appears normal, supple, no cervical masses, normal ROM, no appreciable thyromegaly; no JVD Respiratory: Diminished to auscultation bilaterally, no wheezing, rales, rhonchi or crackles. Normal respiratory effort and patient is not tachypenic. No accessory muscle use. Unlabored breathing  Cardiovascular: RRR, no murmurs / rubs / gallops. S1 and S2 auscultated. No extremity edema. 2+ pedal pulses. No carotid bruits.  Abdomen: Soft, non-tender, Distended. Bowel sounds positive and hyperactive.  GU: Deferred. Musculoskeletal: No clubbing / cyanosis of digits/nails. No joint deformity upper and lower extremities.   Skin: No rashes, lesions, ulcers on a limited skin evaluation. No induration; Warm and dry.  Neurologic: CN 2-12 grossly intact with no focal deficits. Romberg sign and  cerebellar reflexes not assessed.  Psychiatric: Normal judgment and insight. Alert and oriented x 3. Normal mood and appropriate affect.   Data Reviewed: I have personally reviewed following labs and imaging studies  CBC: Recent Labs  Lab 12/09/19 0453 12/10/19 0547 12/11/19 0531 12/12/19 0535 12/13/19 0707  WBC 4.3 8.0 5.6 5.9 5.6  HGB 13.7 15.4 14.7 13.8 14.6  HCT 41.7 47.0 44.7 41.7 45.7  MCV 97.2 98.9 98.9 96.5 98.9  PLT 238 277 272 255 086   Basic Metabolic Panel: Recent Labs  Lab 12/09/19 0453 12/09/19 1249 12/10/19 0547 12/11/19 0531 12/12/19 0535 12/13/19 0707  NA 137  --  136 139 137 137  K 3.5  --  3.6 3.7 4.0 3.3*  CL 97*  --  94* 100 98 98  CO2 28  --  27 30 28 27   GLUCOSE 98  --  98 117* 95 104*  BUN <5*  --  <5* <5* <5* <5*  CREATININE 0.39*  --  0.59* 0.55* 0.50* 0.51*  CALCIUM 8.1*  --  8.5* 8.3* 8.4* 8.5*  MG  --  1.6*  --   --  1.8 1.8   GFR: Estimated Creatinine Clearance: 124 mL/min (A) (by C-G formula based on SCr of 0.51 mg/dL (L)). Liver Function Tests: Recent Labs  Lab 12/09/19 0453 12/10/19 0547 12/11/19 0531 12/12/19 0535 12/13/19 0707  AST 23 24 27 20  37  ALT 57* 52* 46* 38 40  ALKPHOS 66 73 66 63 68  BILITOT 1.3* 1.2 0.9 0.6 0.8  PROT 4.9* 5.7* 5.5* 5.4* 5.9*  ALBUMIN 2.5* 2.8* 2.7* 2.7* 3.1*   No results for input(s): LIPASE, AMYLASE in the last 168 hours. No results for input(s): AMMONIA in the last 168 hours. Coagulation Profile: No results for input(s): INR, PROTIME in the last 168 hours. Cardiac Enzymes: No results for input(s): CKTOTAL, CKMB, CKMBINDEX, TROPONINI in the last 168 hours. BNP (last 3 results) No results for input(s): PROBNP in the last 8760 hours. HbA1C: No results for input(s): HGBA1C in the last 72 hours. CBG: No results for input(s): GLUCAP in the last 168 hours. Lipid Profile: No results for input(s): CHOL, HDL, LDLCALC, TRIG, CHOLHDL, LDLDIRECT in the last 72 hours. Thyroid Function Tests: No  results for input(s): TSH, T4TOTAL, FREET4, T3FREE, THYROIDAB in the last 72 hours. Anemia Panel: No results for input(s): VITAMINB12, FOLATE, FERRITIN, TIBC, IRON, RETICCTPCT in the last 72 hours. Sepsis Labs: No results for input(s): PROCALCITON, LATICACIDVEN in the last 168 hours.  No results found for this or any previous visit (from the past 240 hour(s)).   RN Pressure Injury Documentation:     Estimated body mass index is 27.29 kg/m as calculated from the following:   Height as of this encounter: 5\' 9"  (1.753 m).   Weight as of this encounter: 83.8 kg.  Malnutrition Type:  Nutrition Problem: Inadequate oral intake Etiology: inability to eat   Malnutrition Characteristics:  Signs/Symptoms: NPO status   Nutrition Interventions:  Interventions: Refer to RD note for recommendations   Radiology Studies: DG ERCP With Sphincterotomy  Result Date: 12/11/2019 CLINICAL DATA:  Pancreatic duct leak. EXAM: ERCP TECHNIQUE: Multiple spot images obtained with the fluoroscopic device and submitted for interpretation post-procedure. FLUOROSCOPY TIME:  Fluoroscopy Time:  6 minutes and 47 seconds Number of Acquired Spot Images: 21 COMPARISON:  CT 12/08/2019 FINDINGS: Pancreatic duct was cannulated and opacified with contrast. No overt contrast extravasation from the pancreatic duct. Mild irregularity of the pancreatic duct along the head / neck region. Wire was advanced into the pancreatic duct and a pancreatic stent was placed. IMPRESSION: Placement of pancreatic stent. These images were submitted for radiologic interpretation only. Please see the procedural report for the amount of contrast and the fluoroscopy time utilized. Electronically Signed   By: Richarda OverlieAdam  Henn M.D.   On: 12/11/2019 16:36   Scheduled Meds: . feeding supplement  1 Container Oral TID BM  . feeding supplement (PRO-STAT SUGAR FREE 64)  30 mL Oral TID  . folic acid  1 mg Oral Daily  . indomethacin  100 mg Rectal Once  .  indomethacin  100 mg Rectal Once  . multivitamin with minerals  1 tablet Oral Daily  . nicotine  7 mg Transdermal Daily  . octreotide  100 mcg Subcutaneous Q8H  . pantoprazole (PROTONIX) IV  40 mg Intravenous Q12H  . thiamine  100 mg Oral Daily   Continuous Infusions: . sodium chloride 1,000 mL (12/10/19 1522)  . heparin 1,750 Units/hr (12/13/19 1331)  . lactated ringers 50 mL/hr at 12/12/19 1558    LOS: 10 days   Merlene Laughtermair Latif Sheikh, DO [facility] PAGER is  on AMION  If 7PM-7AM, please contact night-coverage [email or web address]

## 2019-12-14 LAB — COMPREHENSIVE METABOLIC PANEL
ALT: 33 U/L (ref 0–44)
AST: 24 U/L (ref 15–41)
Albumin: 3 g/dL — ABNORMAL LOW (ref 3.5–5.0)
Alkaline Phosphatase: 60 U/L (ref 38–126)
Anion gap: 10 (ref 5–15)
BUN: <5 mg/dL — ABNORMAL LOW (ref 6–20)
CO2: 27 mmol/L (ref 22–32)
Calcium: 8.5 mg/dL — ABNORMAL LOW (ref 8.9–10.3)
Chloride: 100 mmol/L (ref 98–111)
Creatinine, Ser: 0.42 mg/dL — ABNORMAL LOW (ref 0.61–1.24)
GFR calc Af Amer: >60 mL/min (ref >60)
GFR calc non Af Amer: >60 mL/min (ref >60)
Glucose, Bld: 92 mg/dL (ref 70–99)
Potassium: 3.8 mmol/L (ref 3.5–5.1)
Sodium: 137 mmol/L (ref 135–145)
Total Bilirubin: 0.9 mg/dL (ref 0.3–1.2)
Total Protein: 5.8 g/dL — ABNORMAL LOW (ref 6.5–8.1)

## 2019-12-14 LAB — CBC WITH DIFFERENTIAL/PLATELET
Abs Immature Granulocytes: 0.01 10*3/uL (ref 0.00–0.07)
Basophils Absolute: 0.1 10*3/uL (ref 0.0–0.1)
Basophils Relative: 2 %
Eosinophils Absolute: 0.2 10*3/uL (ref 0.0–0.5)
Eosinophils Relative: 3 %
HCT: 43.9 % (ref 39.0–52.0)
Hemoglobin: 14.5 g/dL (ref 13.0–17.0)
Immature Granulocytes: 0 %
Lymphocytes Relative: 34 %
Lymphs Abs: 1.8 10*3/uL (ref 0.7–4.0)
MCH: 32.4 pg (ref 26.0–34.0)
MCHC: 33 g/dL (ref 30.0–36.0)
MCV: 98 fL (ref 80.0–100.0)
Monocytes Absolute: 0.5 10*3/uL (ref 0.1–1.0)
Monocytes Relative: 10 %
Neutro Abs: 2.6 10*3/uL (ref 1.7–7.7)
Neutrophils Relative %: 51 %
Platelets: 227 10*3/uL (ref 150–400)
RBC: 4.48 MIL/uL (ref 4.22–5.81)
RDW: 13.9 % (ref 11.5–15.5)
WBC: 5.2 10*3/uL (ref 4.0–10.5)
nRBC: 0 % (ref 0.0–0.2)

## 2019-12-14 LAB — HEMOGLOBIN A1C
Hgb A1c MFr Bld: 5.3 % (ref 4.8–5.6)
Mean Plasma Glucose: 105.41 mg/dL

## 2019-12-14 LAB — HEPARIN LEVEL (UNFRACTIONATED): Heparin Unfractionated: 0.45 IU/mL (ref 0.30–0.70)

## 2019-12-14 LAB — PHOSPHORUS: Phosphorus: 4.2 mg/dL (ref 2.5–4.6)

## 2019-12-14 LAB — MAGNESIUM: Magnesium: 1.7 mg/dL (ref 1.7–2.4)

## 2019-12-14 MED ORDER — PANTOPRAZOLE SODIUM 40 MG PO TBEC
40.0000 mg | DELAYED_RELEASE_TABLET | Freq: Every day | ORAL | Status: DC
  Administered 2019-12-15: 40 mg via ORAL
  Filled 2019-12-14: qty 1

## 2019-12-14 NOTE — Progress Notes (Signed)
ANTICOAGULATION CONSULT NOTE - Follow Up Consult  Pharmacy Consult for Heparin Indication: portal vein thrombosis   Allergies  Allergen Reactions  . Penicillins Rash    Has patient had a PCN reaction causing immediate rash, facial/tongue/throat swelling, SOB or lightheadedness with hypotension: yes Has patient had a PCN reaction causing severe rash involving mucus membranes or skin necrosis: Yes Has patient had a PCN reaction that required hospitalization: No Has patient had a PCN reaction occurring within the last 10 years: No If all of the above answers are "NO", then may proceed with Cephalosporin use.    Patient Measurements: Height: 5\' 9"  (175.3 cm) Weight: 184 lb 12.8 oz (83.8 kg) IBW/kg (Calculated) : 70.7 Heparin Dosing Weight: n/a. Use TBW of 83 kg  Vital Signs: Temp: 98.4 F (36.9 C) (12/26 0544) Temp Source: Oral (12/26 0544) BP: 133/93 (12/26 0544) Pulse Rate: 58 (12/26 0544)  Labs: Recent Labs    12/12/19 0535 12/13/19 0707 12/13/19 1545 12/14/19 0505  HGB 13.8 14.6  --  14.5  HCT 41.7 45.7  --  43.9  PLT 255 234  --  227  HEPARINUNFRC 0.24* 0.82* 0.41 0.45  CREATININE 0.50* 0.51*  --  0.42*   Estimated Creatinine Clearance: 124 mL/min (A) (by C-G formula based on SCr of 0.42 mg/dL (L)).  Medications:  Infusions:  . sodium chloride 1,000 mL (12/10/19 1522)  . heparin 1,750 Units/hr (12/14/19 0131)   Assessment: Pharmacy is consulted to dose heparin in 38 yo male diagnosed with portal vein thrombosis. CT done on 12/17 shows "venous thrombosis which is extensive in the SMV, main portal, left portal and right portal. There is still some patency of main portal vein and right portal branches." Pt was not on anticoagulation PTA.  Significant Events: -12/18: ERCP -12/23: ERCP with pancreatic stent placement, sphincterotomy  Goal of Therapy:  Heparin level 0.3-0.7 units/ml Monitor platelets by anticoagulation protocol: Yes   Today, 12/14/2019   HL  0.45, therapeutic on 1750 units/hr  Hgb/Plt - stable, WNL  No bleeding noted  Plan:  Continue heparin drip at 1750 units/hr  CBC and HL daily   Monitor for signs/symptoms of bleeding  Dolly Rias RPh 12/14/2019, 7:08 AM

## 2019-12-14 NOTE — Progress Notes (Addendum)
Daily Rounding Note  12/14/2019, 10:26 AM  LOS: 11 days   SUBJECTIVE:   Chief complaint: Acute pancreatitis. Abdominal pain overall improved but still requiring occasional narcotics.   Tolerating solid food but cannot eat a whole lot at once.  No nausea.  Eating triggers bowel motility but not necessarily a bowel movement.  Had BM this morning.  OBJECTIVE:         Vital signs in last 24 hours:    Temp:  [97.8 F (36.6 C)-98.4 F (36.9 C)] 98.4 F (36.9 C) (12/26 0544) Pulse Rate:  [54-58] 58 (12/26 0544) Resp:  [14-18] 14 (12/26 0544) BP: (132-139)/(93-96) 133/93 (12/26 0544) SpO2:  [94 %-96 %] 94 % (12/26 0544) Last BM Date: 12/12/19 Filed Weights   12/02/19 1933 12/07/19 0609  Weight: 74.4 kg 83.8 kg   General: Looks well.  Comfortable.  Alert Heart: RRR. Chest: Clear bilaterally.  No cough or labored breathing Abdomen: Soft.  Active bowel sounds.  Not distended.  Mild to moderate tenderness on the upper abdomen, no guarding or rebound.  No masses. Extremities: No CCE Neuro/Psych: Pleasant, calm, fluid speech.  No gross neurologic weakness or deficits.  No tremors  Intake/Output from previous day: 12/25 0701 - 12/26 0700 In: 3159.8 [P.O.:1191; I.V.:1968.8] Out: 4400 [Urine:4400]  Intake/Output this shift: Total I/O In: 120 [P.O.:120] Out: -   Lab Results: Recent Labs    12/12/19 0535 12/13/19 0707 12/14/19 0505  WBC 5.9 5.6 5.2  HGB 13.8 14.6 14.5  HCT 41.7 45.7 43.9  PLT 255 234 227   BMET Recent Labs    12/12/19 0535 12/13/19 0707 12/14/19 0505  NA 137 137 137  K 4.0 3.3* 3.8  CL 98 98 100  CO2 28 27 27   GLUCOSE 95 104* 92  BUN <5* <5* <5*  CREATININE 0.50* 0.51* 0.42*  CALCIUM 8.4* 8.5* 8.5*   LFT Recent Labs    12/12/19 0535 12/13/19 0707 12/14/19 0505  PROT 5.4* 5.9* 5.8*  ALBUMIN 2.7* 3.1* 3.0*  AST 20 37 24  ALT 38 40 33  ALKPHOS 63 68 60  BILITOT 0.6 0.8 0.9    PT/INR No results for input(s): LABPROT, INR in the last 72 hours. Hepatitis Panel No results for input(s): HEPBSAG, HCVAB, HEPAIGM, HEPBIGM in the last 72 hours.  Studies/Results: No results found.   Scheduled Meds: . feeding supplement  1 Container Oral TID BM  . feeding supplement (PRO-STAT SUGAR FREE 64)  30 mL Oral TID  . folic acid  1 mg Oral Daily  . indomethacin  100 mg Rectal Once  . indomethacin  100 mg Rectal Once  . multivitamin with minerals  1 tablet Oral Daily  . nicotine  7 mg Transdermal Daily  . octreotide  100 mcg Subcutaneous Q8H  . pantoprazole (PROTONIX) IV  40 mg Intravenous Q12H  . thiamine  100 mg Oral Daily   Continuous Infusions: . sodium chloride 1,000 mL (12/10/19 1522)  . heparin 1,750 Units/hr (12/14/19 0131)   PRN Meds:.sodium chloride, HYDROmorphone (DILAUDID) injection, ondansetron **OR** [DISCONTINUED] ondansetron (ZOFRAN) IV, oxyCODONE   ASSESMENT:   *   Recurrent pancreatitis.  Necrotizing pancreatitis.  Multiple abdominal fluid collections.  Pancreatic duct disruption.  Abdominal ascites. 12/11/2019 ERCP with stenting to PD.  On subcutaneous octreotide. LFTs improved.  *    SMV/PV thrombosis.  On heparin.  *    Malnutrition.  On heart healthy diet along with nutritional supplements.  PLAN   *  Switch Protonix to oral route.  Okay to switch to oral anticoagulation. Could discharge either later today or tomorrow and patient is okay with either day. Has GI return office visit set for 01/02/20 w Dr Loletha Carrow.    *   Low fat diet.   ? If ok to send home on limited Rx for oxycodone?  *   Stop octreotide after tonight or at DC.      Azucena Freed  12/14/2019, 10:26 AM Phone [phone]     Attending physician's note   I have taken an interval history, reviewed the chart and examined the patient. I agree with the Advanced Practitioner's note, impression and recommendations.   Acute severe necrotizing pancreatitis (ETOH)  complicated by large fluid collection s/p ERCP with PD stenting 12/23.  Tolerating p.o. well. SMV/PV thrombosis on heparin  Plan: -Switch heparin to Eliquis.  Need anticoagulation x 3 months. -Can use oxycodone for pain on PRN basis. -He has FU appt with Dr. Loletha Carrow Jan 02, 2020. -After appt, he may need rpt CT Abdo/pelvis pancreatic protocol to assess fluid collections and repeat ERCP in 4 to 6 weeks thereafter to change/remove pancreatic stent. -Small but more frequent meals.  No need for pancreatic supplements as yet.  Watch for DM/EPI as outpatient. -He has assured me that he would not drink anymore alcohol.  He is highly motivated. -We will sign off for now.  Expect discharge in a.m.   Carmell Austria, MD [facility] Heckler Fabienne Bruns [phone].

## 2019-12-14 NOTE — Progress Notes (Signed)
PROGRESS NOTE    Paul Allen  QQP:[id number] DOB: [birth year] DOA: 12/02/2019 PCP: Patient, No Pcp Per   Brief Narrative:  Patient is a 38 year old overweight Caucasian male with a past medical history significant for not limited to alcohol abuse who presents with abdominal pain that is intermittent with epigastric discomfort after he was diagnosed with acute pancreatitis in outpatient setting.  He was admitted here with acute alcoholic pancreatitis and was complicated by pancreatic duct disruption and extensive portal vein and SMV thrombosis.  He underwent ERCP on 12/11/2019 which showed no leakage or extravasation of contrast from irregular-appearing pancreatic duct in the head neck region.  He is also status post enterotomy and plastic stent placement into the ventral pancreatic duct and post procedure he felt better.  Diet is being advanced and he was placed on the full liquid diet yesterday and being advanced to soft diet today.  He was placed on IV meropenem and now this was stopped by GI.  GI is now switching Protonix to p.o. as well as stopping heparin drip and switching to oral anticoagulation.  They are also stopping octreotide tonight and recommend discharging the a.m. and using oxycodone as needed for his pain.  GI recommended small but more frequent meals and watching for diabetes mellitus/exocrine pancreatic insufficiency as an outpatient.  Anticipate discharging home in the next 24 to 48 hours  Assessment & Plan:   Active Problems:   Pancreatitis, acute   Elevated LFTs   Elevated hemoglobin (HCC)   Pancreatic ascites   Abdominal pain, epigastric   Portal vein thrombosis   Bile leak   Choledocholithiasis   Pancreatic duct leak   Acute necrotizing pancreatitis with main pancreatic duct disruption/SMV thrombosis and portal vein thrombosis likely secondary to alcohol abuse. -Status post repeat ERCP on 12/11/2019. No leakage / extravasation of contrast from the irregular  appearing PD in the head / neck region.  Also status post sphincterotomy and a plastic stent placed into ventral PD. -Postprocedure patient is doing significantly better but still has some abdominal pain -Diet is being advanced by GI and Dr. Lyndel Safe has placed the patient on a Soft Diet and recommending low-fat diet with smaller frequent meals -IV fluids have now been stopped -Continue pain management IV Hydromorphone 1 mg q4hprn Severe Pain and Oxycodone 10 mg po q6hprn; Will D/C with Oxycodone at D/C -C/w Nutritional Supplements with Boost Breeze TID and 30 mL Prostat TID  -Continue Ondansetron 4 mg po q6hprn Nausea  -Continue Octreotide 100 mcg sq Injections q8h until tonight and to be stopped  -IV Meropenem now stopped by GI -Changed IV PPI 40 mg BID to po  and Antiemetics with po Zofran 4 mg q6hprn Nausea -Anticipate D/C home in the next 24-48 hours  Extensive SMV thrombosis and portal vein thrombosis -Was on a heparin drip with Pharmacy to dose; Transition to a NOAC Eliquis prior to D/C and this was done today   Scrotal Edema, improving  -improving with support and elevating the leg.   -May use sling -Discontinued IV hydration as his p.o. intake improves.  Currently on lactated Ringer 50 cc/h only and will stop now -C/w Ambulation   Elevated LFTs/Abnormal LFTs -Resolved now; AST is now 24 and ALT is now 33 -Likely secondary to #1 and #2 -Continue to Monitor and Trend -Repeat CMP in AM   Hypokalemia -Mild at 3.3 and improved to 3.8 -Replete with po Potassium Chloride 40 mEQ BID x2 yesterday  -Continue to Monitor and Replete as  Necessary -Repeat CMP in AM   QT prolongation -No Issues  -Changed Zofran to as needed; may d/c  -QTc was 473 -Repeat EKG in AM if necessary    Hyperglycemia -Patient's blood sugar has been ranging from 95-117 -Checked hemoglobin A1c iand was 5.3 -Continue to monitor trend blood sugars carefully and if necessary will place on sensitive  NovoLog/scale insulin AC  DVT prophylaxis: Anticoagulated with a Heparin gtt and changing to Eliquis; Will need free 30 Day Card  Code Status: FULL CODE Family Communication: No family present at bedside  Disposition Plan: Anticipate D/C Home in the next 24-48 hours  Consultants:   General Surgery  Gastroenterology    Procedures:  EGD Findings:      The scout film was normal.      The upper GI tract was traversed under direct vision without detailed       examination. Diffuse severe inflammation characterized by congestion       (edema), erythema, friability and granularity was found in the duodenal       bulb, in the first portion of the duodenum, in the second portion of the       duodenum, in the area of the papilla, in the area of the minor papilla       and in the third portion of the duodenum. After 10 minutes of trying to       find the region using Fluroscopy as a map, I saw that the major papilla       was small and congested.      Initial attempts at cannulation were not successful of the biliary or       pancreatic systems. The ventral pancreatic duct could not be cannulated       with the Autotome sphincterotome with standard 0.035 Jagwire. I switched       to an angled 0.035 Jagwire and then an angled Revolution Jagwire and was       not successful at passage. I proceeded with placement of the       sphincterotome catheter into the presumed orifice and a superficial       cannulation of and contrast injection into the ventral pancreatic duct       alone was accomplished with the Autotome sphincterotome. I personally       interpreted the pancreatic duct images. Ductal flow of contrast was       adequate. Image quality was adequate. Contrast extended to the       pancreatic duct. Segmental irregularity of the pancreatic duct was seen       in the ventral pancreatic duct in the head/neck region of the pancreas       and pancreatic duct. I did not see an overt  extravasation of contrast in       the entire opacified area. I then proceeded with attempt at wire       placement having the pancreatogram as a guide. Using the Angled tip and       swirl of the Revoultion Jagwire, a short 0.025 inch Antonietta Breach was passed       into the ventral pancreatic duct into the pancreatic body. The ventral       pancreatic duct was then deeply cannulated with the Autotome       sphincterotome. Contrast was injected and did not note an overt       pancreatic leak. A 4 mm ventral pancreatic sphincterotomy was made with  a monofilament Autotome sphincterotome using ERBE electrocautery. There       was no post-sphincterotomy bleeding. I decided, due to the clinical       course and concern for leak, though I could not visualize one in what       had previously been reported as the disruption in the neck region, one 5       Fr by 7 cm plastic pancreatic stent with two external flaps and a single       internal flap was placed into the ventral pancreatic duct. Clear fluid       flowed through the stent. The stent was in good position.      The duodenoscope was withdrawn from the patient. Impression:               - Severe Duodenitis noted.                           - The major papilla appeared to be small and                            congested.                           - After significant difficulty with cannulation, a                            superficial injection showed access of the                            pancreatic tree and then a wire was able to be                            placed into the pancreatic body. An irregularity                            was found in the the pancreatic duct in the                            head/neck region. Overt leakage/extravasation was                            not noted however.                           - A pancreatic sphincterotomy was performed.                           - One plastic pancreatic stent was placed  into the                            ventral pancreatic duct due to concern of leak.                            Looking at things at the end of case, a potentially  longer 9 cm stent likely would have been able to                            placed.                           - A leak in the distal region of the pancreatic                            tail could have been missed, but nothing suggested                            this based on fluoroscopy today. Moderate Sedation:      Not Applicable - Patient had care per Anesthesia. Recommendation:           - The patient will be observed post-procedure,                            until all discharge criteria are met.                           - Return patient to hospital ward for ongoing care.                           - Observe patient's clinical course.                           - Watch for pancreatitis, bleeding, perforation,                            and cholangitis.                           - Repeat ERCP in 1-2 months to exchange stent, if                            patient does well.                           - Would recommend that ascites fluid be sampled. I                            query, whether the patient may actually have had                            development of ascites from his PV thrombus and                            significant liver insufficiency from that.                            Pancreatic leak still possible but not visualized                            on pancreatogram  today. When ascites is sampled,                            would proceed with fluid cell                            count/culture/amylase/lipase/LDH/glucose.                           - If concern for issues with stent, such that a                            longer stent should be considered, then would                            recommend trying to place wire into the PD                            alongside the  currently placed stent so as to not                            increase issues with deep wire placement and                            consider a 9 cm stent next time. I will be away for                            the next 1-week, so please reach out if I can be of                            assistance to the ERCPist oncall. I will be happy                            to follow this patient with his primary GI moving                            forward as a Optometrist.                           - GI Inpatient team to continue to follow.                           - May restart heparin _0  on 12/24 without bolus,                            in effort of decreasing risk of post-sphincterotomy                            bleeding.                           - Recommend H. pylori serology and treatment if  positive.                           - Continue PPI IV until able to take PO.                           - Recommend at this point enteral nutrition via                            Cortrak to optimize healing.                           - The findings and recommendations were discussed                            with the patient.                           - The findings and recommendations were discussed    Antimicrobials:  Anti-infectives (From admission, onward)   Start     Dose/Rate Route Frequency Ordered Stop   12/05/19 1200  meropenem (MERREM) 1 g in sodium chloride 0.9 % 100 mL IVPB  Status:  Discontinued     1 g 200 mL/hr over 30 Minutes Intravenous Every 8 hours 12/05/19 1117 12/13/19 1055     Subjective: Seen and examined at bedside he said that he is tolerating a soft diet but not as well as he had expected.  Still having some abdominal pain.  No nausea or vomiting.  States he had a bowel movement.  No other concerns or complaints at this time.  Objective: Vitals:   12/13/19 1357 12/13/19 2047 12/14/19 0544 12/14/19 1310  BP: (!) 139/96 (!) 132/96 (!)  133/93 (!) 140/91  Pulse: (!) 54 (!) 58 (!) 58 (!) 54  Resp: _0 Temp: 98.1 F (36.7 C) 97.8 F (36.6 C) 98.4 F (36.9 C) 98.4 F (36.9 C)  TempSrc: Oral Oral Oral Oral  SpO2: 95% 96% 94% 96%  Weight:      Height:        Intake/Output Summary (Last 24 hours) at 12/14/2019 1440 Last data filed at 12/14/2019 4098 Gross per 24 hour  Intake 1118.58 ml  Output 3800 ml  Net -2681.42 ml   Filed Weights   12/02/19 1933 12/07/19 0609  Weight: 74.4 kg 83.8 kg   Examination: Physical Exam:  Constitutional: WN/WD overweight Caucasian male currently in no acute distress appears calm but slightly uncomfortable Eyes: Lids and conjunctivae normal, sclerae anicteric  ENMT: External Ears, Nose appear normal. Grossly normal hearing. Mucous membranes are moist.  Neck: Appears normal, supple, no cervical masses, normal ROM, no appreciable thyromegaly; no JVD Respiratory: Diminished to auscultation bilaterally, no wheezing, rales, rhonchi or crackles. Normal respiratory effort and patient is not tachypenic. Cardiovascular: RRR, no murmurs / rubs / gallops. S1 and S2 auscultated. Trace extremity edema.  Abdomen: Soft, mildly tender, Distended. Bowel sounds positive.  GU: Deferred. Musculoskeletal: No clubbing / cyanosis of digits/nails. No joint deformity upper and lower extremities.  Skin: No rashes, lesions, ulcers on a limited skin evaluation. No induration; Warm and dry.  Neurologic: CN 2-12 grossly intact with no focal deficits. Romberg sign and cerebellar reflexes not assessed.  Psychiatric: Normal judgment and insight. Alert  and oriented x 3. Normal mood and appropriate affect.   Data Reviewed: I have personally reviewed following labs and imaging studies  CBC: Recent Labs  Lab 12/10/19 0547 12/11/19 0531 12/12/19 0535 12/13/19 0707 12/14/19 0505  WBC 8.0 5.6 5.9 5.6 5.2  NEUTROABS  --   --   --   --  2.6  HGB 15.4 14.7 13.8 14.6 14.5  HCT 47.0 44.7 41.7 45.7 43.9  MCV  98.9 98.9 96.5 98.9 98.0  PLT 277 272 255 234 096   Basic Metabolic Panel: Recent Labs  Lab 12/09/19 1249 12/10/19 0547 12/11/19 0531 12/12/19 0535 12/13/19 0707 12/14/19 0505  NA  --  136 139 137 137 137  K  --  3.6 3.7 4.0 3.3* 3.8  CL  --  94* 100 98 98 100  CO2  --  _0 GLUCOSE  --  98 117* 95 104* 92  BUN  --  <5* <5* <5* <5* <5*  CREATININE  --  0.59* 0.55* 0.50* 0.51* 0.42*  CALCIUM  --  8.5* 8.3* 8.4* 8.5* 8.5*  MG 1.6*  --   --  1.8 1.8 1.7  PHOS  --   --   --   --   --  4.2   GFR: Estimated Creatinine Clearance: 124 mL/min (A) (by C-G formula based on SCr of 0.42 mg/dL (L)). Liver Function Tests: Recent Labs  Lab 12/10/19 0547 12/11/19 0531 12/12/19 0535 12/13/19 0707 12/14/19 0505  AST _1 37 24  ALT 52* 46* 38 40 33  ALKPHOS 73 66 63 68 60  BILITOT 1.2 0.9 0.6 0.8 0.9  PROT 5.7* 5.5* 5.4* 5.9* 5.8*  ALBUMIN 2.8* 2.7* 2.7* 3.1* 3.0*   No results for input(s): LIPASE, AMYLASE in the last 168 hours. No results for input(s): AMMONIA in the last 168 hours. Coagulation Profile: No results for input(s): INR, PROTIME in the last 168 hours. Cardiac Enzymes: No results for input(s): CKTOTAL, CKMB, CKMBINDEX, TROPONINI in the last 168 hours. BNP (last 3 results) No results for input(s): PROBNP in the last 8760 hours. HbA1C: Recent Labs    12/14/19 0505  HGBA1C 5.3   CBG: No results for input(s): GLUCAP in the last 168 hours. Lipid Profile: No results for input(s): CHOL, HDL, LDLCALC, TRIG, CHOLHDL, LDLDIRECT in the last 72 hours. Thyroid Function Tests: No results for input(s): TSH, T4TOTAL, FREET4, T3FREE, THYROIDAB in the last 72 hours. Anemia Panel: No results for input(s): VITAMINB12, FOLATE, FERRITIN, TIBC, IRON, RETICCTPCT in the last 72 hours. Sepsis Labs: No results for input(s): PROCALCITON, LATICACIDVEN in the last 168 hours.  No results found for this or any previous visit (from the past 240 hour(s)).   RN Pressure Injury  Documentation:     Estimated body mass index is 27.29 kg/m as calculated from the following:   Height as of this encounter: _2  (1.753 m).   Weight as of this encounter: 83.8 kg.  Malnutrition Type:  Nutrition Problem: Inadequate oral intake Etiology: inability to eat   Malnutrition Characteristics:  Signs/Symptoms: NPO status   Nutrition Interventions:  Interventions: Refer to RD note for recommendations   Radiology Studies: No results found. Scheduled Meds: . feeding supplement  1 Container Oral TID BM  . feeding supplement (PRO-STAT SUGAR FREE 64)  30 mL Oral TID  . folic acid  1 mg Oral Daily  . indomethacin  100 mg Rectal Once  . indomethacin  100 mg Rectal Once  .  multivitamin with minerals  1 tablet Oral Daily  . nicotine  7 mg Transdermal Daily  . octreotide  100 mcg Subcutaneous Q8H  . [START ON 12/15/2019] pantoprazole  40 mg Oral Q0600  . thiamine  100 mg Oral Daily   Continuous Infusions: . sodium chloride 1,000 mL (12/10/19 1522)  . heparin 1,750 Units/hr (12/14/19 0131)    LOS: 11 days   Kerney Elbe, DO [facility] PAGER is on [place]  If 7PM-7AM, please contact night-coverage [email or web address]

## 2019-12-15 ENCOUNTER — Encounter (HOSPITAL_COMMUNITY): Payer: Self-pay | Admitting: Internal Medicine

## 2019-12-15 LAB — COMPREHENSIVE METABOLIC PANEL
ALT: 30 U/L (ref 0–44)
AST: 20 U/L (ref 15–41)
Albumin: 3.4 g/dL — ABNORMAL LOW (ref 3.5–5.0)
Alkaline Phosphatase: 68 U/L (ref 38–126)
Anion gap: 15 (ref 5–15)
BUN: <5 mg/dL — ABNORMAL LOW (ref 6–20)
CO2: 27 mmol/L (ref 22–32)
Calcium: 8.4 mg/dL — ABNORMAL LOW (ref 8.9–10.3)
Chloride: 95 mmol/L — ABNORMAL LOW (ref 98–111)
Creatinine, Ser: 0.4 mg/dL — ABNORMAL LOW (ref 0.61–1.24)
GFR calc Af Amer: >60 mL/min (ref >60)
GFR calc non Af Amer: >60 mL/min (ref >60)
Glucose, Bld: 119 mg/dL — ABNORMAL HIGH (ref 70–99)
Potassium: 4 mmol/L (ref 3.5–5.1)
Sodium: 137 mmol/L (ref 135–145)
Total Bilirubin: 1.2 mg/dL (ref 0.3–1.2)
Total Protein: 6.5 g/dL (ref 6.5–8.1)

## 2019-12-15 LAB — CBC WITH DIFFERENTIAL/PLATELET
Abs Immature Granulocytes: 0.02 10*3/uL (ref 0.00–0.07)
Basophils Absolute: 0.1 10*3/uL (ref 0.0–0.1)
Basophils Relative: 2 %
Eosinophils Absolute: 0.2 10*3/uL (ref 0.0–0.5)
Eosinophils Relative: 4 %
HCT: 45.9 % (ref 39.0–52.0)
Hemoglobin: 14.7 g/dL (ref 13.0–17.0)
Immature Granulocytes: 0 %
Lymphocytes Relative: 26 %
Lymphs Abs: 1.4 10*3/uL (ref 0.7–4.0)
MCH: 31.7 pg (ref 26.0–34.0)
MCHC: 32 g/dL (ref 30.0–36.0)
MCV: 99.1 fL (ref 80.0–100.0)
Monocytes Absolute: 0.6 10*3/uL (ref 0.1–1.0)
Monocytes Relative: 10 %
Neutro Abs: 3.1 10*3/uL (ref 1.7–7.7)
Neutrophils Relative %: 58 %
Platelets: 211 10*3/uL (ref 150–400)
RBC: 4.63 MIL/uL (ref 4.22–5.81)
RDW: 13.9 % (ref 11.5–15.5)
WBC: 5.4 10*3/uL (ref 4.0–10.5)
nRBC: 0 % (ref 0.0–0.2)

## 2019-12-15 LAB — HEPARIN LEVEL (UNFRACTIONATED): Heparin Unfractionated: 0.41 IU/mL (ref 0.30–0.70)

## 2019-12-15 LAB — MAGNESIUM: Magnesium: 1.9 mg/dL (ref 1.7–2.4)

## 2019-12-15 LAB — PHOSPHORUS: Phosphorus: 4.2 mg/dL (ref 2.5–4.6)

## 2019-12-15 MED ORDER — OXYCODONE HCL 10 MG PO TABS: 10.0000 mg | ORAL_TABLET | Freq: Four times a day (QID) | ORAL | 0 refills | Status: DC | PRN

## 2019-12-15 MED ORDER — APIXABAN 5 MG PO TABS: 5.0000 mg | ORAL_TABLET | Freq: Two times a day (BID) | ORAL | Status: DC

## 2019-12-15 MED ORDER — APIXABAN 5 MG PO TABS: 10.0000 mg | ORAL_TABLET | Freq: Two times a day (BID) | ORAL | Status: DC

## 2019-12-15 MED ORDER — APIXABAN 5 MG PO TABS
10.0000 mg | ORAL_TABLET | Freq: Two times a day (BID) | ORAL | Status: DC
  Administered 2019-12-15: 10 mg via ORAL
  Filled 2019-12-15: qty 2

## 2019-12-15 MED ORDER — THIAMINE HCL 100 MG PO TABS: 100.0000 mg | ORAL_TABLET | Freq: Every day | ORAL | Status: DC

## 2019-12-15 MED ORDER — PANTOPRAZOLE SODIUM 40 MG PO TBEC: 40.0000 mg | DELAYED_RELEASE_TABLET | Freq: Every day | ORAL | 2 refills | Status: DC

## 2019-12-15 MED ORDER — FOLIC ACID 1 MG PO TABS: 1.0000 mg | ORAL_TABLET | Freq: Every day | ORAL | Status: DC

## 2019-12-15 MED ORDER — APIXABAN 5 MG PO TABS: ORAL_TABLET | ORAL | 2 refills | Status: DC

## 2019-12-15 NOTE — Progress Notes (Signed)
ANTICOAGULATION CONSULT NOTE - Follow Up Consult  Pharmacy Consult for Heparin Indication: portal vein thrombosis   Allergies  Allergen Reactions  . Penicillins Rash    Has patient had a PCN reaction causing immediate rash, facial/tongue/throat swelling, SOB or lightheadedness with hypotension: yes Has patient had a PCN reaction causing severe rash involving mucus membranes or skin necrosis: Yes Has patient had a PCN reaction that required hospitalization: No Has patient had a PCN reaction occurring within the last 10 years: No If all of the above answers are "NO", then may proceed with Cephalosporin use.    Patient Measurements: Height: 5\' 9"  (175.3 cm) Weight: 184 lb 12.8 oz (83.8 kg) IBW/kg (Calculated) : 70.7 Heparin Dosing Weight: n/a. Use TBW of 83 kg  Vital Signs: Temp: 98.3 F (36.8 C) (12/27 0559) Temp Source: Oral (12/27 0559) BP: 138/93 (12/27 0559) Pulse Rate: 56 (12/27 0559)  Labs: Recent Labs    12/13/19 0707 12/13/19 1545 12/14/19 0505 12/15/19 0515  HGB 14.6  --  14.5 14.7  HCT 45.7  --  43.9 45.9  PLT 234  --  227 211  HEPARINUNFRC 0.82* 0.41 0.45 0.41  CREATININE 0.51*  --  0.42*  --    Estimated Creatinine Clearance: 124 mL/min (A) (by C-G formula based on SCr of 0.42 mg/dL (L)).  Medications:  Infusions:  . sodium chloride 1,000 mL (12/10/19 1522)  . heparin 1,750 Units/hr (12/14/19 1549)   Assessment: Pharmacy is consulted to dose heparin in 38 yo male diagnosed with portal vein thrombosis. CT done on 12/17 shows "venous thrombosis which is extensive in the SMV, main portal, left portal and right portal. There is still some patency of main portal vein and right portal branches." Pt was not on anticoagulation PTA.  Significant Events: -12/18: ERCP -12/23: ERCP with pancreatic stent placement, sphincterotomy  Goal of Therapy:  Heparin level 0.3-0.7 units/ml Monitor platelets by anticoagulation protocol: Yes   Today, 12/15/2019   HL 0.41,  therapeutic on 1750 units/hr  Hgb/Plt - stable, WNL  No bleeding noted  Plan:  Continue heparin drip at 1750 units/hr  CBC and HL daily   Monitor for signs/symptoms of bleeding  Dolly Rias RPh 12/15/2019, 7:09 AM

## 2019-12-15 NOTE — Discharge Summary (Signed)
Physician Discharge Summary  KIRBY SARKER ZOX:[id number] DOB: 09/15/1981 DOA: 12/02/2019  PCP: Patient, No Pcp Per  Admit date: 12/02/2019 Discharge date: 12/15/2019  Admitted From: Home  Disposition:  Home   Recommendations for Outpatient Follow-up:  1. Follow up with Gastroenterology Dr. Myrtie Neither on Jan 14 2. Dr. Myrtie Neither: Please obtain CT abdomen pelvis pancreatic protocol if needed     Home Health: None  Equipment/Devices: None  Discharge Condition: Fair  CODE STATUS: FULL Diet recommendation: Small frequent meals, low residue  Brief/Interim Summary: Mr. Tota is a 38 y.o. M with recurrent alcohol-induced pancreatitis who now presents with several weeks progressive intermittent epigastric pain, vomiting, and reduced appetite.  This is his 3rd episode in 1 year, had initially presented early in the month, seen in the ER and discharged, followed up with GI.  Noted pseudocysts on imaging at that time, as well as elevated Bilirubin, but at that point was doing fairly well, and was managed conservatively at home until his pain became severe and he was admitted on 12/14.         PRINCIPAL HOSPITAL DIAGNOSIS: Acute necrotizing pancreatitis    Discharge Diagnoses:   Recurrent pancreatitis with necrosis, no infection, complicated by pancreatic duct disruption GI were consulted.  The patient was managed conservatively, biotics and octreotide given the pancreatic duct disruption noted on imaging.  Then on 12/18 had EGD, but due to portal hypertension gastropathy, unable to locate the major papilla and so no ERCP was possible.    On 12/23 had repeat EGD, this time with ERCP with stenting.  Afterwards his pain improved, octreotide and antibiotics were stopped.  He was able to advance his diet gradually, and his pain was controlled with oral medications.   SMV and portal vein thrombosis Patient had SMV thrombosis noted on imaging.  This was a first-time, provoked thrombotic  event.  He was started on heparin drip, transition to Eliquis at discharge.  He should complete 3 months of Eliquis ending March 14, 2020.  Transaminitis Resolving.  Hypokalemia Improved      Discharge Instructions  Discharge Instructions    Discharge instructions   Complete by: As directed    From Drs. Sheikh and Rozell Theiler: You were admitted with pancreatitis. You had a complicated course (specifically, your pancreatic duct was disrupted, you developed fluid collections in your abdomen around your pancreas, and you developed blood clots adjacent to the pancreatitis) You must follow up with Dr. Myrtie Neither on Jan 14 Eat small meals. Eat low fat foods. You may also eat/drink a nutritional supplement like Boost Breeze twice daily And take folate and thiamine (these are over the counter supplements) at the doses listed below, for 2 months   Increase your pantoprazole/Protonix dose. Protonix and famotidine/Pepcid are similar, so you don't have to take both.  For the blood clots: Take apixaban/Eliquis for three months, then stop You have to take a loading dose of this medicine: so you take a double dose for the first week:  for 1 week then reduce to 5 mg (1 tab) twice daily for three months  From Dec 27 to Jan 2: Take apixaban/Eliquis 10 mg (two tabs) twice daily   From Jan 2 to Mar 27: Take apixaban/Eliquis 5 mg (1 tab) twice daily  If you have any bloody bowel movements, black and tarry bowel movements, stop the ELiquis and call your doctor immediately. Tell all your doctors you are on Eliquis  Take oxycodone as needed up to every 6 hours for pain Do  not take oxycodone with other sedating medicines or sleep medicines Do not take oxycodone and drive Stop all alcohol   Increase activity slowly   Complete by: As directed      Allergies as of 12/15/2019      Reactions   Penicillins Rash   Has patient had a PCN reaction causing immediate rash, facial/tongue/throat swelling, SOB  or lightheadedness with hypotension: yes Has patient had a PCN reaction causing severe rash involving mucus membranes or skin necrosis: Yes Has patient had a PCN reaction that required hospitalization: No Has patient had a PCN reaction occurring within the last 10 years: No If all of the above answers are "NO", then may proceed with Cephalosporin use.      Medication List    STOP taking these medications   famotidine 20 MG tablet Commonly known as: PEPCID   multivitamin with minerals Tabs tablet   oxyCODONE-acetaminophen 5-325 MG tablet Commonly known as: Percocet   traMADol 50 MG tablet Commonly known as: ULTRAM     TAKE these medications   apixaban 5 MG Tabs tablet Commonly known as: ELIQUIS Take 10 mg (2 tabs) twice daily for 1 week, then take 5 mg (1 tab) twice daily for 3 months then stop   folic acid 1 MG tablet Commonly known as: FOLVITE Take 1 tablet (1 mg total) by mouth daily.   Oxycodone HCl 10 MG Tabs Take 1 tablet (10 mg total) by mouth every 6 (six) hours as needed for moderate pain.   pantoprazole 40 MG tablet Commonly known as: PROTONIX Take 1 tablet (40 mg total) by mouth daily at 6 (six) AM. Start taking on: December 16, 2019 What changed:   medication strength  how much to take  when to take this   thiamine 100 MG tablet Take 1 tablet (100 mg total) by mouth daily.      Follow-up Information    [facility] Follow up.   Why: please call monday morning to schedule a follow-up visit and establish primary care. Contact information: [address] [place] [address] [phone]         Allergies  Allergen Reactions  . Penicillins Rash    Has patient had a PCN reaction causing immediate rash, facial/tongue/throat swelling, SOB or lightheadedness with hypotension: yes Has patient had a PCN reaction causing severe rash involving mucus membranes or skin necrosis: Yes Has patient had a  PCN reaction that required hospitalization: No Has patient had a PCN reaction occurring within the last 10 years: No If all of the above answers are "NO", then may proceed with Cephalosporin use.     Consultations:  GI   Procedures/Studies: CT ABDOMEN PELVIS W CONTRAST  Result Date: 12/08/2019 CLINICAL DATA:  Pancreatitis EXAM: CT ABDOMEN AND PELVIS WITH CONTRAST TECHNIQUE: Multidetector CT imaging of the abdomen and pelvis was performed using the standard protocol following bolus administration of intravenous contrast. CONTRAST:  OMNIPAQUE IOHEXOL 300 MG/ML  SOLN COMPARISON:  December 2 second 2020 FINDINGS: Lower chest: There are small bilateral pleural effusions, left greater than right. There is atelectasis at the lung bases.The heart size is normal. Hepatobiliary: There is periportal edema. There is intrahepatic biliary ductal dilatation. There is a heterogeneous hypoattenuating region in hepatic segment 6/7 which is new from prior CT. The portal vein supplying this region of the liver appears to be occluded. The gallbladder is unremarkable. Pancreas: Again noted are findings of subacute pancreatitis with pancreatic necrosis involving the  pancreatic neck. There are increasing peripancreatic fluid collections. Spleen: The spleen is borderline enlarged. Adrenals/Urinary Tract: --Adrenal glands: No adrenal hemorrhage. --Right kidney/ureter: No hydronephrosis or perinephric hematoma. --Left kidney/ureter: No hydronephrosis or perinephric hematoma. --Urinary bladder: Unremarkable. Stomach/Bowel: --Stomach/Duodenum: No hiatal hernia or other gastric abnormality. Normal duodenal course and caliber. There is a large well-formed fluid collection measuring approximately 6.5 by 7.1 cm abutting the gastric body. --Small bowel: No dilatation or inflammation. --Colon: Rectosigmoid diverticulosis without acute inflammation. There is diffuse wall thickening of the hepatic flexure of the colon which is felt  to be reactive in etiology. --Appendix: Not visualized. No right lower quadrant inflammation or free fluid. Vascular/Lymphatic: Atherosclerotic calcification is present within the non-aneurysmal abdominal aorta, without hemodynamically significant stenosis. Thrombus is noted extending into the main portal vein with likely near complete occlusion of the left portal vein. There is branch occlusion involving the posterior branches of the right portal vein. There is thrombus extending into the SMV. The splenic vein remains patent. The splenic artery remains patent. --there are some mildly enlarged reactive lymph nodes in the retroperitoneal space. --No mesenteric lymphadenopathy. --No pelvic or inguinal lymphadenopathy. Reproductive: Unremarkable Other: There is a large volume of abdominal ascites which has significantly increased from prior study. There is mesenteric edema and haziness of the omentum. There is new diffuse body wall edema extending into the patient's scrotum. Musculoskeletal. No acute displaced fractures. IMPRESSION: 1. Significant interval increase in abdominal ascites and peripancreatic fluid collections. 2. Persistent nonocclusive thrombosis of the main portal vein with occlusive to near occlusive thrombus within the left portal vein. There is likely branch portal vein thrombosis involving the posterior divisions of the right hepatic lobe. There is persistent thrombus extending into the SMV. The splenic vein remains patent. 3. Persistent heterogeneous area involving hepatic segment 7/6, concerning for hepatic necrosis as seen on prior MRI. 4. Again noted are findings of pancreatitis with pancreatic necrosis of the pancreatic neck. 5. Worsening body wall edema. 6. Small bilateral pleural effusions, left greater than right. 7. Persistent wall thickening of the hepatic flexure of the colon felt to be reactive in etiology. Aortic Atherosclerosis (ICD10-I70.0). Electronically Signed   By: Katherine Mantle M.D.   On: 12/08/2019 19:18   CT ABDOMEN PELVIS W CONTRAST  Result Date: 11/20/2019 CLINICAL DATA:  Abdominal pain, acute, generalized. Progressive abdominal pain over the last 2 days. Elevated white count. EXAM: CT ABDOMEN AND PELVIS WITH CONTRAST TECHNIQUE: Multidetector CT imaging of the abdomen and pelvis was performed using the standard protocol following bolus administration of intravenous contrast. CONTRAST:  OMNIPAQUE IOHEXOL 300 MG/ML  SOLN COMPARISON:  CT of the abdomen and pelvis 07/04/2019 FINDINGS: Lower chest: The lung bases are clear without focal nodule mass, or airspace disease. Heart size is normal. No significant pleural or pericardial effusion is present. Hepatobiliary: Diffuse fatty infiltration of the liver is again noted. A more heterogeneous pattern is present today. Gallbladder is mildly distended. The common bile duct is mildly distended. Pancreas: Inflammatory changes are present at the head of the pancreas. There is mild duct dilation. Cystic lesion within the head of the pancreas is new, measuring up to 16 mm. This likely represents a developing pseudocyst. Body and distal tail are within normal limits. No definite mass lesion is present. Spleen: Normal in size without focal abnormality. Adrenals/Urinary Tract: Adrenal glands are normal bilaterally. Kidneys and ureters are within normal limits. The urinary bladder is unremarkable. Stomach/Bowel: Inflammatory changes and distal stomach and proximal duodenum are noted without  obstruction. These may be secondary. Small bowel is within normal limits. Terminal ileum is unremarkable. Appendectomy is noted. Marked inflammatory changes are present in the hepatic flexure extensive pericolonic stranding is noted. No free air is present. The transverse colon is otherwise within normal limits. Descending and sigmoid colon are normal. Vascular/Lymphatic: Minimal calcifications scratched at scattered calcifications are present in the  aorta and branch vessels without aneurysm. No significant retroperitoneal adenopathy is present. Reproductive: Prostate is unremarkable. Other: Free fluid is noted about the pancreatic head. Additional fluid is present within the anatomic pelvis. No free air is present. No significant ventral hernia is present. Musculoskeletal: No acute or significant osseous findings. IMPRESSION: 1. Inflammatory changes at the head of the pancreas compatible with acute pancreatitis. 2. Marked inflammatory changes about the hepatic flexure of the colon. This is likely a secondary colitis due to inflammatory changes at the pancreatic head. No associated obstruction. 3. New 16 mm cystic lesion within the head of the pancreas likely represents a developing pseudocyst. 4. Inflammatory changes in the distal stomach and proximal duodenum without obstruction. 5. Diffuse fatty infiltration of the liver. 6. Aortic Atherosclerosis (ICD10-I70.0). Electronically Signed   By: Marin Roberts M.D.   On: 11/20/2019 04:28   MR 3D Recon At Scanner  Result Date: 12/05/2019 CLINICAL DATA:  Acute abdominal pain, history of pancreatitis. EXAM: MRI ABDOMEN WITHOUT AND WITH CONTRAST (INCLUDING MRCP) TECHNIQUE: Multiplanar multisequence MR imaging of the abdomen was performed both before and after the administration of intravenous contrast. Heavily T2-weighted images of the biliary and pancreatic ducts were obtained, and three-dimensional MRCP images were rendered by post processing. CONTRAST:  7mL GADAVIST GADOBUTROL 1 MMOL/ML IV SOLN COMPARISON:  CT evaluation of 11/20/2019 FINDINGS: Lower chest: Trace pleural fluid on the left. No pericardial effusion. Limited assessment of the lung bases on MRI. Hepatobiliary: MRCP images markedly limited secondary to diffuse intra-abdominal ascites and extensive edema. Extensive cholelithiasis and sludge in the dependent gallbladder. No large filling defect is seen in the biliary tree in the biliary tree is  nondilated. See below for pancreatic findings of note. There is diffuse perihepatic ascites including fluid in the lesser sac about the caudate in the hepato gastric recess. Diffusely abnormal enhancement of the liver on the arterial phase of the study. Patency of arterial vessels is not well assessed but there is evidence of portal thrombosis and SMV thrombosis, left portal vein is completely thrombosed though has some flow around the thrombus within the lumen period right posterior division portal branches are thrombosed with partial thrombus of anterior division branches. The central main portal vein and right portal venous branches remain opacified with contrast, not yet completely thrombosed. The SMV thrombus is sparing peripheral venous branches in the mesentery, is more central and extends along approximately 3 cm of the SMV, extending a short ways into the main portal vein. There are developing collaterals in short gastric and gastroepiploic branches. The central liver in areas subtended by thrombosed portal venous branches does not enhance to the same extent as other areas, most pronounced on the arterial phase data set involving mainly the right hepatic lobe but also medial segment of left hepatic lobe. Finding is predominantly central within the liver, best seen on image 43 is of the arterial phase portion of the exam. Heterogeneous enhancement normalizes to a large extent on venous phase with persistent heterogeneity of the posterior right hemi liver in hepatic subsegment VI and VII, adjacent to pancreatic fluid in Morrison's pouch, which is measures approximately 6.1  x 3.0 with striated appearance. This is in a segment of the liver subtended by a thrombosed right posterior portal venous branch best seen on image 40 of the venous phase postcontrast data set. Small thrombi are also likely present in the anterior right portal division seen on image 35 of venous phase imaging. The left portal vein is nearly  completely occluded. Pancreas: There is a defect in the neck of the pancreas which communicates with a lobulated fluid collection that tracks into the lesser sac. The duct in the pancreatic neck at the site of the defect is lost within the pancreatic fluid. In total this area measures 3 x 2.3 cm. Fluid in the hepatic gastric recess of the lesser sac measures 6.2 x 3.6 cm. Fluid tracking along the splenorenal ligament into the left upper quadrant beneath the left hemidiaphragm, dominant collection measures 6.8 x 4.9 cm with another cylindrical tract extending towards a smaller collection in the left subdiaphragmatic space, this measures approximately 3.8 x 3.0 cm with the tract extending along approximately a 3.3 cm path bridging these 2 collections. Fluid in the anterior abdomen within the transverse mesocolon measures 5.0 x 2.8 cm. Loculated collections described above show increased signal on T1 compatible with proteinaceous or hemorrhagic contents particularly areas in the main portion of the lesser sac adjacent to stomach and spleen and in the transverse mesocolon. Diffuse intra-abdominal ascites is present along with extensive edema. Postcontrast the defect in the neck of the pancreas is best seen on image 57 of the arterial phase measuring approximately 17 cm in greatest with. No signs of ductal dilation in the setting of presumed ductal interruption given other findings described above. Spleen: Splenic vein remains patent in the setting of SMV and portal thrombosis. Spleen is mildly enlarged but otherwise unremarkable. Adrenals/Urinary Tract:  Adrenal glands are normal. Kidneys with smooth contour show normal enhancement. Stomach/Bowel: Extensive edema of stomach and transverse colon in particular. Vascular/Lymphatic: Venous thrombosis with thrombus in the SMV, extending into main portal vein, also in the left and right portal branches greatest on the left. The peripheral splenic vein is patent. Thrombosis of  right portal branches Other:  Diffuse pancreatic ascites as described. Musculoskeletal: Normal appearance of visualized bones. IMPRESSION: Necrotic pancreatitis with ductal interruption in the neck of the pancreas resulting in hemorrhagic or proteinaceous pancreatic collections in the lesser sac and in upper abdominal mesenteric structures and diffuse pancreatic ascites. 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 There is still some patency of main portal vein and right portal branches. Findings of hepatic inflammation versus early hepatic necrosis in the right hepatic lobe with markedly abnormal enhancement of the liver on arterial phase imaging, becoming more normal on later phase with the exception of the area in the posterior right hemi liver. Extensive bowel edema worsened areas directly affected by pancreatic collections, particularly stomach and transverse colon. No signs of biliary ductal dilation with limited assessment of the biliary tree. No filling defects seen in the common bile duct but with numerous gallstones and evidence of sludge in the dependent portion of the gallbladder. Critical Value/emergent results were called by telephone at the time of interpretation on 12/05/2019 at 8:21 am to [facility] , who verbally acknowledged these results. Electronically Signed   By: Donzetta Kohut M.D.   On: 12/05/2019 08:25   RUQ [place]  Result Date: 12/03/2019 CLINICAL DATA:  Right upper quadrant abdominal pain. EXAM: ULTRASOUND ABDOMEN LIMITED RIGHT  UPPER QUADRANT COMPARISON:  CT dated November 20, 2019 FINDINGS: Gallbladder: There is diffuse gallbladder sludge without evidence for gallbladder wall thickening or pericholecystic free fluid. The sonographic Eulah Pont sign is negative. The gallbladder is somewhat distended. Common bile duct: Diameter: 4 mm Liver: The liver parenchyma is coarsened and heterogeneous. There is  hepatopetal flow within the main pulmonary vein. There is incomplete color filling of the main pulmonary vein raising concern for portal vein thrombosis. Other: There is a small volume of abdominal ascites. IMPRESSION: 1. Gallbladder sludge without evidence for acute cholecystitis. 2. Incomplete color filling of the portal vein raises concern for portal venous thrombosis. This can be further evaluated with a contrast enhanced CT. 3. Coarsened heterogeneous appearance of the liver likely related to hepatocellular disease. 4. Small volume abdominal ascites. Electronically Signed   By: Katherine Mantle M.D.   On: 12/03/2019 01:20   DG ERCP With Sphincterotomy  Result Date: 12/11/2019 CLINICAL DATA:  Pancreatic duct leak. EXAM: ERCP TECHNIQUE: Multiple spot images obtained with the fluoroscopic device and submitted for interpretation post-procedure. FLUOROSCOPY TIME:  Fluoroscopy Time:  6 minutes and 47 seconds Number of Acquired Spot Images: 21 COMPARISON:  CT 12/08/2019 FINDINGS: Pancreatic duct was cannulated and opacified with contrast. No overt contrast extravasation from the pancreatic duct. Mild irregularity of the pancreatic duct along the head / neck region. Wire was advanced into the pancreatic duct and a pancreatic stent was placed. IMPRESSION: Placement of pancreatic stent. These images were submitted for radiologic interpretation only. Please see the procedural report for the amount of contrast and the fluoroscopy time utilized. Electronically Signed   By: Richarda Overlie M.D.   On: 12/11/2019 16:36   DG ERCP BILIARY & PANCREATIC DUCTS  Result Date: 12/06/2019 CLINICAL DATA:  38 year old male with a history of pancreatitis EXAM: ERCP TECHNIQUE: Multiple spot images obtained with the fluoroscopic device and submitted for interpretation post-procedure. FLUOROSCOPY TIME:  Fluoroscopy Time:  0 minutes 7 seconds COMPARISON:  MR 12/04/2019 FINDINGS: Limited intraoperative fluoroscopic spot images. Single  image demonstrates the endoscope projecting over the upper abdomen. IMPRESSION: Limited images of ERCP with single image demonstrating the endoscope over the upper abdomen. Please refer to the dictated operative report for full details of intraoperative findings and procedure. Electronically Signed   By: Gilmer Mor D.O.   On: 12/06/2019 12:29   MR ABDOMEN MRCP W WO CONTAST  Result Date: 12/05/2019 CLINICAL DATA:  Acute abdominal pain, history of pancreatitis. EXAM: MRI ABDOMEN WITHOUT AND WITH CONTRAST (INCLUDING MRCP) TECHNIQUE: Multiplanar multisequence MR imaging of the abdomen was performed both before and after the administration of intravenous contrast. Heavily T2-weighted images of the biliary and pancreatic ducts were obtained, and three-dimensional MRCP images were rendered by post processing. CONTRAST:  7mL GADAVIST GADOBUTROL 1 MMOL/ML IV SOLN COMPARISON:  CT evaluation of 11/20/2019 FINDINGS: Lower chest: Trace pleural fluid on the left. No pericardial effusion. Limited assessment of the lung bases on MRI. Hepatobiliary: MRCP images markedly limited secondary to diffuse intra-abdominal ascites and extensive edema. Extensive cholelithiasis and sludge in the dependent gallbladder. No large filling defect is seen in the biliary tree in the biliary tree is nondilated. See below for pancreatic findings of note. There is diffuse perihepatic ascites including fluid in the lesser sac about the caudate in the hepato gastric recess. Diffusely abnormal enhancement of the liver on the arterial phase of the study. Patency of arterial vessels is not well assessed but there is evidence of portal thrombosis and SMV thrombosis, left portal  vein is completely thrombosed though has some flow around the thrombus within the lumen period right posterior division portal branches are thrombosed with partial thrombus of anterior division branches. The central main portal vein and right portal venous branches remain  opacified with contrast, not yet completely thrombosed. The SMV thrombus is sparing peripheral venous branches in the mesentery, is more central and extends along approximately 3 cm of the SMV, extending a short ways into the main portal vein. There are developing collaterals in short gastric and gastroepiploic branches. The central liver in areas subtended by thrombosed portal venous branches does not enhance to the same extent as other areas, most pronounced on the arterial phase data set involving mainly the right hepatic lobe but also medial segment of left hepatic lobe. Finding is predominantly central within the liver, best seen on image 43 is of the arterial phase portion of the exam. Heterogeneous enhancement normalizes to a large extent on venous phase with persistent heterogeneity of the posterior right hemi liver in hepatic subsegment VI and VII, adjacent to pancreatic fluid in Morrison's pouch, which is measures approximately 6.1 x 3.0 with striated appearance. This is in a segment of the liver subtended by a thrombosed right posterior portal venous branch best seen on image 40 of the venous phase postcontrast data set. Small thrombi are also likely present in the anterior right portal division seen on image 35 of venous phase imaging. The left portal vein is nearly completely occluded. Pancreas: There is a defect in the neck of the pancreas which communicates with a lobulated fluid collection that tracks into the lesser sac. The duct in the pancreatic neck at the site of the defect is lost within the pancreatic fluid. In total this area measures 3 x 2.3 cm. Fluid in the hepatic gastric recess of the lesser sac measures 6.2 x 3.6 cm. Fluid tracking along the splenorenal ligament into the left upper quadrant beneath the left hemidiaphragm, dominant collection measures 6.8 x 4.9 cm with another cylindrical tract extending towards a smaller collection in the left subdiaphragmatic space, this measures  approximately 3.8 x 3.0 cm with the tract extending along approximately a 3.3 cm path bridging these 2 collections. Fluid in the anterior abdomen within the transverse mesocolon measures 5.0 x 2.8 cm. Loculated collections described above show increased signal on T1 compatible with proteinaceous or hemorrhagic contents particularly areas in the main portion of the lesser sac adjacent to stomach and spleen and in the transverse mesocolon. Diffuse intra-abdominal ascites is present along with extensive edema. Postcontrast the defect in the neck of the pancreas is best seen on image 57 of the arterial phase measuring approximately 17 cm in greatest with. No signs of ductal dilation in the setting of presumed ductal interruption given other findings described above. Spleen: Splenic vein remains patent in the setting of SMV and portal thrombosis. Spleen is mildly enlarged but otherwise unremarkable. Adrenals/Urinary Tract:  Adrenal glands are normal. Kidneys with smooth contour show normal enhancement. Stomach/Bowel: Extensive edema of stomach and transverse colon in particular. Vascular/Lymphatic: Venous thrombosis with thrombus in the SMV, extending into main portal vein, also in the left and right portal branches greatest on the left. The peripheral splenic vein is patent. Thrombosis of right portal branches Other:  Diffuse pancreatic ascites as described. Musculoskeletal: Normal appearance of visualized bones. 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 There is still some patency of main portal vein and right portal branches. Findings of hepatic inflammation versus early hepatic necrosis in the right  hepatic lobe with markedly abnormal enhancement of the liver on arterial phase imaging, becoming more normal on later phase with the exception of the area in the posterior right hemi liver. Extensive bowel edema worsened areas directly affected by pancreatic collections, particularly stomach and transverse colon. No signs of biliary ductal dilation with limited assessment of the biliary tree. No filling defects seen in the common bile duct but with numerous gallstones and evidence of sludge in the dependent portion of the gallbladder. Critical Value/emergent results were called by telephone at the time of interpretation on 12/05/2019 at 8:21 am to providerJENNIFER [facility] , who verbally acknowledged these results. Electronically Signed   By: Donzetta Kohut M.D.   On: 12/05/2019 08:25       Subjective: Feeling well.  No vomiting, nausea.  His epigastric pain is improving.  He is able to tolerate some oral intake without vomiting or nausea.  Discharge Exam: Vitals:   12/14/19 2258 12/15/19 0559  BP: 119/77 (!) 138/93  Pulse: (!) 48 (!) 56  Resp: 16 16  Temp: 97.9 F (36.6 C) 98.3 F (36.8 C)  SpO2: 96% 99%   Vitals:   12/14/19 0544 12/14/19 1310 12/14/19 2258 12/15/19 0559  BP: (!) 133/93 (!) 140/91 119/77 (!) 138/93  Pulse: (!) 58 (!) 54 (!) 48 (!) 56  Resp: 14 16 16 16   Temp: 98.4 F (36.9 C) 98.4 F (36.9 C) 97.9 F (36.6 C) 98.3 F (36.8 C)  TempSrc: Oral Oral Oral Oral  SpO2: 94% 96% 96% 99%  Weight:      Height:        General: Pt is alert, awake, not in acute distress Cardiovascular: RRR, nl S1-S2, no murmurs appreciated.   No LE edema.   Respiratory: Normal respiratory rate and rhythm.  CTAB without rales or wheezes. Abdominal: Abdomen soft.  Epigastric tenderness is noted, but no rigidity or rebound or masses.  No distension or HSM.   Neuro/Psych: Strength symmetric in upper and lower extremities.  Judgment and insight appear normal.   The results of significant  diagnostics from this hospitalization (including imaging, microbiology, ancillary and laboratory) are listed below for reference.     Microbiology: No results found for this or any previous visit (from the past 240 hour(s)).   Labs: BNP (last 3 results) No results for input(s): BNP in the last 8760 hours. Basic Metabolic Panel: Recent Labs  Lab 12/09/19 1249 12/11/19 0531 12/12/19 0535 12/13/19 0707 12/14/19 0505 12/15/19 0515  NA  --  139 137 137 137 137  K  --  3.7 4.0 3.3* 3.8 4.0  CL  --  100 98 98 100 95*  CO2  --  30 28 27 27 27   GLUCOSE  --  117* 95 104* 92 119*  BUN  --  <5* <5* <5* <5* <5*  CREATININE  --  0.55* 0.50* 0.51* 0.42* 0.40*  CALCIUM  --  8.3* 8.4* 8.5* 8.5* 8.4*  MG 1.6*  --  1.8 1.8 1.7 1.9  PHOS  --   --   --   --  4.2 4.2   Liver Function Tests: Recent Labs  Lab 12/11/19 0531 12/12/19 0535 12/13/19 0707 12/14/19 0505 12/15/19 0515  AST 27  20 37 24 20  ALT 46* 38 40 33 30  ALKPHOS 66 63 68 60 68  BILITOT 0.9 0.6 0.8 0.9 1.2  PROT 5.5* 5.4* 5.9* 5.8* 6.5  ALBUMIN 2.7* 2.7* 3.1* 3.0* 3.4*   No results for input(s): LIPASE, AMYLASE in the last 168 hours. No results for input(s): AMMONIA in the last 168 hours. CBC: Recent Labs  Lab 12/11/19 0531 12/12/19 0535 12/13/19 0707 12/14/19 0505 12/15/19 0515  WBC 5.6 5.9 5.6 5.2 5.4  NEUTROABS  --   --   --  2.6 3.1  HGB 14.7 13.8 14.6 14.5 14.7  HCT 44.7 41.7 45.7 43.9 45.9  MCV 98.9 96.5 98.9 98.0 99.1  PLT 272 255 234 227 211   Cardiac Enzymes: No results for input(s): CKTOTAL, CKMB, CKMBINDEX, TROPONINI in the last 168 hours. BNP: Invalid input(s): POCBNP CBG: No results for input(s): GLUCAP in the last 168 hours. D-Dimer No results for input(s): DDIMER in the last 72 hours. Hgb A1c Recent Labs    12/14/19 0505  HGBA1C 5.3   Lipid Profile No results for input(s): CHOL, HDL, LDLCALC, TRIG, CHOLHDL, LDLDIRECT in the last 72 hours. Thyroid function studies No results for  input(s): TSH, T4TOTAL, T3FREE, THYROIDAB in the last 72 hours.  Invalid input(s): FREET3 Anemia work up No results for input(s): VITAMINB12, FOLATE, FERRITIN, TIBC, IRON, RETICCTPCT in the last 72 hours. Urinalysis    Component Value Date/Time   COLORURINE AMBER (A) 12/02/2019 2010   APPEARANCEUR HAZY (A) 12/02/2019 2010   LABSPEC 1.031 (H) 12/02/2019 2010   PHURINE 5.0 12/02/2019 2010   GLUCOSEU 50 (A) 12/02/2019 2010   HGBUR NEGATIVE 12/02/2019 2010   BILIRUBINUR SMALL (A) 12/02/2019 2010   KETONESUR 5 (A) 12/02/2019 2010   PROTEINUR 30 (A) 12/02/2019 2010   UROBILINOGEN 1.0 01/13/2008 2059   NITRITE NEGATIVE 12/02/2019 2010   LEUKOCYTESUR NEGATIVE 12/02/2019 2010   Sepsis Labs Invalid input(s): PROCALCITONIN,  WBC,  LACTICIDVEN Microbiology No results found for this or any previous visit (from the past 240 hour(s)).   Time coordinating discharge: 25 minutes The [place] controlled substances registry was reviewed for this patient prior to filling the <5 days supply controlled substances script.      SIGNED:   Alberteen Sam, MD  [facility] 12/15/2019, 7:07 PM

## 2019-12-15 NOTE — Discharge Instructions (Signed)
Information on my medicine - ELIQUIS (apixaban)  This medication education was reviewed with me or my healthcare representative as part of my discharge preparation.    Why was Eliquis prescribed for you? Eliquis was prescribed to treat blood clots that may have been found in the your vein in the liver and to reduce the risk of them occurring again.  What do You need to know about Eliquis ? The starting dose is 10 mg (two 5 mg tablets) taken TWICE daily for the FIRST SEVEN (7) DAYS, then on 12/22/2019 the dose is reduced to ONE 5 mg tablet taken TWICE daily.  Eliquis may be taken with or without food.   Try to take the dose about the same time in the morning and in the evening. If you have difficulty swallowing the tablet whole please discuss with your pharmacist how to take the medication safely.  Take Eliquis exactly as prescribed and DO NOT stop taking Eliquis without talking to the doctor who prescribed the medication.  Stopping may increase your risk of developing a new blood clot.  Refill your prescription before you run out.  After discharge, you should have regular check-up appointments with your healthcare provider that is prescribing your Eliquis.    What do you do if you miss a dose? If a dose of ELIQUIS is not taken at the scheduled time, take it as soon as possible on the same day and twice-daily administration should be resumed. The dose should not be doubled to make up for a missed dose.  Important Safety Information A possible side effect of Eliquis is bleeding. You should call your healthcare provider right away if you experience any of the following: ? Bleeding from an injury or your nose that does not stop. ? Unusual colored urine (red or dark brown) or unusual colored stools (red or black). ? Unusual bruising for unknown reasons. ? A serious fall or if you hit your head (even if there is no bleeding).  Some medicines may interact with Eliquis and might  increase your risk of bleeding or clotting while on Eliquis. To help avoid this, consult your healthcare provider or pharmacist prior to using any new prescription or non-prescription medications, including herbals, vitamins, non-steroidal anti-inflammatory drugs (NSAIDs) and supplements.  This website has more information on Eliquis (apixaban): http://www.eliquis.com/eliquis/home

## 2019-12-15 NOTE — TOC Progression Note (Signed)
Transition of Care [facility]) - Progression Note    Patient Details  Name: Paul Allen MRN: [id number] Date of Birth: 01/24/1981  [facility] CM/SW Contact  Joaquin Courts, RN Phone Number: 12/15/2019, 9:31 AM  Clinical Narrative:   CM spoke with patient. Patient reports he was concerned about affording Eliquis given its high cost. CM has spoke with pharmacy and they are planning to provide education to patient regarding the drug and provide patient with 30-day free card. Cm discussed with the patient the need to establish primary care and spoke about [facility]. Information placed on AVS. Patient will need to call Monday morning to schedule a follow-up visit as the clinic is closed on the weekend.  CM explained to patient that clinic has financial assistance programs and in house pharmacy that can provide reduced cost medications.  Cm also spoke with patient about goodrx which provides coupons to help reduce the cost of other medications ordered.  CM reviewed dc medication scripts and notes ost medications are available on the 4$ list except protonix which is 50$ with goodrx coupon at [facility] and 17$ with coupon at [facility].  Patient was informed of this and states he will consider having his prescriptions transferred to [facility]. No other needs identified at this time.       Expected Discharge Plan: Home/Self Care Barriers to Discharge: No Barriers Identified  Expected Discharge Plan and Services Expected Discharge Plan: Home/Self Care   Discharge Planning Services: CM Consult   Living arrangements for the past 2 months: Single Family Home Expected Discharge Date: 12/15/19               DME Arranged: N/A DME Agency: NA       HH Arranged: NA HH Agency: NA         Social Determinants of Health (SDOH) Interventions    Readmission Risk Interventions No flowsheet data found.

## 2019-12-16 ENCOUNTER — Telehealth: Payer: Self-pay

## 2019-12-16 NOTE — Telephone Encounter (Signed)
Message received from Nancy Marus, RN CM requesting a hospital follow up appointment for the patient at [facility].  Call placed to patient # [phone], message left with call back requested to this CM

## 2019-12-18 ENCOUNTER — Telehealth: Payer: Self-pay

## 2019-12-18 NOTE — Telephone Encounter (Signed)
Message received from Nancy Marus, RN CM requesting a hospital follow up appointment for the patient.   This CM spoke to patient and scheduled an appt for 12/24/2018 @ 1330 @ [place].   He said that he has transportation to the clinic

## 2019-12-25 ENCOUNTER — Ambulatory Visit (INDEPENDENT_AMBULATORY_CARE_PROVIDER_SITE_OTHER): Payer: Self-pay | Admitting: Primary Care

## 2019-12-25 ENCOUNTER — Encounter (INDEPENDENT_AMBULATORY_CARE_PROVIDER_SITE_OTHER): Payer: Self-pay | Admitting: Primary Care

## 2019-12-25 ENCOUNTER — Other Ambulatory Visit: Payer: Self-pay

## 2019-12-25 VITALS — BP 126/83 | HR 77 | Temp 97.3°F | Ht 69.0 in | Wt 155.4 lb

## 2019-12-25 DIAGNOSIS — Z720 Tobacco use: Secondary | ICD-10-CM

## 2019-12-25 DIAGNOSIS — Z23 Encounter for immunization: Secondary | ICD-10-CM

## 2019-12-25 DIAGNOSIS — Z6822 Body mass index (BMI) 22.0-22.9, adult: Secondary | ICD-10-CM

## 2019-12-25 DIAGNOSIS — K852 Alcohol induced acute pancreatitis without necrosis or infection: Secondary | ICD-10-CM

## 2019-12-25 DIAGNOSIS — Z7689 Persons encountering health services in other specified circumstances: Secondary | ICD-10-CM

## 2019-12-25 DIAGNOSIS — Z09 Encounter for follow-up examination after completed treatment for conditions other than malignant neoplasm: Secondary | ICD-10-CM

## 2019-12-25 MED ORDER — NICOTINE POLACRILEX 2 MG MT GUM: 2.0000 mg | CHEWING_GUM | OROMUCOSAL | Status: AC | PRN

## 2019-12-25 NOTE — Patient Instructions (Signed)
Protein-Energy Malnutrition Protein-energy malnutrition is when a person does not eat enough protein, fat, and calories. When this happens over time, it can lead to severe loss of muscle tissue (muscle wasting). This condition also affects the body's defense system (immune system) and can lead to other health problems. What are the causes? This condition may be caused by:  Not eating enough protein, fat, or calories.  Having certain chronic medical conditions.  Eating too little. What increases the risk? The following factors may make you more likely to develop this condition:  Living in poverty.  Long-term hospitalization.  Alcohol or drug dependency. Addiction often leads to a lifestyle in which proper diet is ignored. Dependency can also hurt the metabolism and the body's ability to absorb nutrients.  Eating disorders, such as anorexia nervosa or bulimia.  Chewing or swallowing problems. People with these disorders may not eat enough.  Having certain conditions, such as: ? Inflammatory bowel disease. Inflammation of the intestines makes it difficult for the body to absorb nutrients. ? Cancer or AIDS. These diseases can cause a loss of appetite. ? Chronic heart failure. This interferes with how the body uses nutrients. ? Cystic fibrosis. This disease can make it difficult for the body to absorb nutrients.  Eating a diet that extremely restricts protein, fat, or calorie intake. What are the signs or symptoms? Symptoms of this condition include:  Fatigue.  Weakness.  Dizziness.  Fainting.  Weight loss.  Loss of muscle tone and muscle mass.  Poor immune response.  Lack of menstruation.  Poor memory.  Hair loss.  Skin changes. How is this diagnosed? This condition may be diagnosed based on:  Your medical and dietary history.  A physical exam. This may include a measurement of your body mass index (BMI).  Blood tests. How is this treated? This condition may  be managed with:  Nutrition therapy. This may include working with a diet and nutrition specialist (dietitian).  Treatment for underlying conditions. People with severe protein-energy malnutrition may need to be treated in a hospital. This may involve receiving nutrition and fluids through an IV. Follow these instructions at home:   Eat a balanced diet. In each meal, include at least one food that is high in protein. Foods that are high in protein include: ? Meat. ? Poultry. ? Fish. ? Eggs. ? Cheese. ? Milk. ? Beans. ? Nuts.  Eat nutrient-rich foods that are easy to swallow and digest, such as: ? Fruit and yogurt smoothies. ? Oatmeal with nut butter.  Try to eat six small meals each day instead of three large meals.  Take vitamin and protein supplements as told by your health care provider or dietitian.  Follow your health care provider's recommendations about exercise and activity.  Keep all follow-up visits as told by your health care provider. This is important. Contact a health care provider if you:  Have increased weakness or fatigue.  Faint.  Are a woman and you stop having your period (menstruating).  Have rapid hair loss.  Have unexpected weight loss.  Have diarrhea.  Have nausea and vomiting. Get help right away if you have:  Difficulty breathing.  Chest pain. Summary  Protein-energy malnutrition is when a person does not eat enough protein, fat, and calories.  Protein-energy malnutrition can lead to severe loss of muscle tissue (muscle wasting). This condition also affects the body's defense system (immune system) and can lead to other health problems.  Talk with your health care provider about treatment for this   condition. Effective treatment depends on the underlying cause of the malnutrition. This information is not intended to replace advice given to you by your health care provider. Make sure you discuss any questions you have with your health  care provider. Document Revised: 12/20/2017 Document Reviewed: 12/20/2017 Elsevier Patient Education  2020 [employer]  

## 2019-12-25 NOTE — Progress Notes (Signed)
Established Patient Office Visit  Subjective:  Patient ID: Paul Allen, male    DOB: [birth year]  Age: 39 y.o. MRN: [id number]  CC:  Chief Complaint  Patient presents with  . Hospitalization Follow-up    pancreatitis     HPI Paul Allen presents for establishment of care and hospital discharge. 11/20/2019  presents the emergency department for abdominal pain, nausea, vomiting and weight loss. He admits that he stop drinking alcohol October 2020 last year. Everything in gut feels burning in the bottom and playing tug of war sharpe pains 10/10- last 10 seconds average discomfort 3/10 pain  Most of the time 6/10 depending on the position.   Past Medical History:  Diagnosis Date  . Abdominal pain   . 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 Nausea & vomiting   . Pancreatitis     Past Surgical History:  Procedure Laterality Date  . APPENDECTOMY    . ERCP N/A 12/06/2019   Procedure: ENDOSCOPIC RETROGRADE CHOLANGIOPANCREATOGRAPHY (ERCP);  Surgeon: Milus Banister, MD;  Location: [facility];  Service: Endoscopy;  Laterality: N/A;  Aborted Procedure  . ERCP N/A 12/11/2019   Procedure: ENDOSCOPIC RETROGRADE CHOLANGIOPANCREATOGRAPHY (ERCP);  Surgeon: Irving Copas., MD;  Location: [facility];  Service: Gastroenterology;  Laterality: N/A;  . PANCREATIC STENT PLACEMENT  12/11/2019   Procedure: PANCREATIC STENT PLACEMENT;  Surgeon: Irving Copas., MD;  Location: [facility];  Service: Gastroenterology;;  . Joan Mayans  12/11/2019   Procedure: Joan Mayans;  Surgeon: Rush Landmark Telford Nab., MD;  Location: [facility];  Service: Gastroenterology;;    Family History  Problem Relation Age of Onset  . Cholecystitis Mother   . Arthritis Mother   . Stomach cancer Father     Social History   Socioeconomic History  . Marital status: Single    Spouse name: Not on file  . Number of children: Not on file  . Years of  education: Not on file  . Highest education level: Not on file  Occupational History  . Not on file  Tobacco Use  . Smoking status: Current Every Day Smoker    Packs/day: 1.50    Types: Cigarettes  . Smokeless tobacco: Never Used  Substance and Sexual Activity  . Alcohol use: Not Currently    Comment: hasn't drank in 4 weeks, but before would drink about 5 malt beers a day  . Drug use: Yes    Frequency: 7.0 times per week    Types: Marijuana  . Sexual activity: Not on file  Other Topics Concern  . Not on file  Social History Narrative  . Not on file   Social Determinants of Health   Financial Resource Strain:   . Difficulty of Paying Living Expenses: Not on file  Food Insecurity:   . Worried About Charity fundraiser in the Last Year: Not on file  . Ran Out of Food in the Last Year: Not on file  Transportation Needs:   . Lack of Transportation (Medical): Not on file  . Lack of Transportation (Non-Medical): Not on file  Physical Activity:   . Days of Exercise per Week: Not on file  . Minutes of Exercise per Session: Not on file  Stress:   . Feeling of Stress : Not on file  Social Connections:   . Frequency of Communication with Friends and Family: Not on file  . Frequency of Social Gatherings with Friends and Family: Not on file  .  Attends Religious Services: Not on file  . Active Member of Clubs or Organizations: Not on file  . Attends Banker Meetings: Not on file  . Marital Status: Not on file  Intimate Partner Violence:   . Fear of Current or Ex-Partner: Not on file  . Emotionally Abused: Not on file  . Physically Abused: Not on file  . Sexually Abused: Not on file    Outpatient Medications Prior to Visit  Medication Sig Dispense Refill  . apixaban (ELIQUIS) 5 MG TABS tablet Take 10 mg (2 tabs) twice daily for 1 week, then take 5 mg (1 tab) twice daily for 3 months then stop 60 tablet 2  . folic acid (FOLVITE) 1 MG tablet Take 1 tablet (1 mg total)  by mouth daily.    . pantoprazole (PROTONIX) 40 MG tablet Take 1 tablet (40 mg total) by mouth daily at 6 (six) AM. 30 tablet 2  . thiamine 100 MG tablet Take 1 tablet (100 mg total) by mouth daily.    Marland Kitchen oxyCODONE 10 MG TABS Take 1 tablet (10 mg total) by mouth every 6 (six) hours as needed for moderate pain. 16 tablet 0   No facility-administered medications prior to visit.    Allergies  Allergen Reactions  . Penicillins Rash    Has patient had a PCN reaction causing immediate rash, facial/tongue/throat swelling, SOB or lightheadedness with hypotension: yes Has patient had a PCN reaction causing severe rash involving mucus membranes or skin necrosis: Yes Has patient had a PCN reaction that required hospitalization: No Has patient had a PCN reaction occurring within the last 10 years: No If all of the above answers are "NO", then may proceed with Cephalosporin use.     ROS Review of Systems  Constitutional: Positive for fatigue.  Cardiovascular:       Positional if lays on his side  Gastrointestinal: Positive for abdominal pain, nausea and vomiting.       A lot of belching   All other systems reviewed and are negative.     Objective:    Physical Exam  Constitutional: He is oriented to person, place, and time. He appears well-developed.  Malnourished low albumin and protein  HENT:  Head: Normocephalic.  Eyes: Pupils are equal, round, and reactive to light. EOM are normal.  Cardiovascular: Normal rate and regular rhythm.  Pulmonary/Chest: Effort normal and breath sounds normal.  Abdominal: Soft. Bowel sounds are normal.  Musculoskeletal:        General: Normal range of motion.     Cervical back: Normal range of motion and neck supple.  Neurological: He is oriented to person, place, and time.  Skin: Skin is warm and dry.  Psychiatric: He has a normal mood and affect. His behavior is normal. Thought content normal.    BP 126/83 (BP Location: Right Arm, Patient Position:  Sitting, Cuff Size: Normal)   Pulse 77   Temp (!) 97.3 F (36.3 C) (Temporal)   Ht 5\' 9"  (1.753 m)   Wt 155 lb 6.4 oz (70.5 kg)   SpO2 97%   BMI 22.95 kg/m  Wt Readings from Last 3 Encounters:  12/25/19 155 lb 6.4 oz (70.5 kg)  12/07/19 184 lb 12.8 oz (83.8 kg)  11/28/19 164 lb 4 oz (74.5 kg)     There are no preventive care reminders to display for this patient.  There are no preventive care reminders to display for this patient.  No results found for: TSH Lab Results  Component Value Date   WBC 5.4 12/15/2019   HGB 14.7 12/15/2019   HCT 45.9 12/15/2019   MCV 99.1 12/15/2019   PLT 211 12/15/2019   Lab Results  Component Value Date   NA 137 12/15/2019   K 4.0 12/15/2019   CO2 27 12/15/2019   GLUCOSE 119 (H) 12/15/2019   BUN <5 (L) 12/15/2019   CREATININE 0.40 (L) 12/15/2019   BILITOT 1.2 12/15/2019   ALKPHOS 68 12/15/2019   AST 20 12/15/2019   ALT 30 12/15/2019   PROT 6.5 12/15/2019   ALBUMIN 3.4 (L) 12/15/2019   CALCIUM 8.4 (L) 12/15/2019   ANIONGAP 15 12/15/2019   GFR 155.89 11/28/2019   Lab Results  Component Value Date   CHOL 162 07/05/2019   Lab Results  Component Value Date   HDL 46 07/05/2019   Lab Results  Component Value Date   LDLCALC 66 07/05/2019   Lab Results  Component Value Date   TRIG 252 (H) 07/05/2019   Lab Results  Component Value Date   CHOLHDL 3.5 07/05/2019   Lab Results  Component Value Date   HGBA1C 5.3 12/14/2019      Assessment & Plan:  Deontrey was seen today for hospitalization follow-up.  Diagnoses and all orders for this visit:  Need for Tdap vaccination -     Tdap vaccine greater than or equal to 7yo IM  Encounter to establish care [name], NP-C will be your  (PCP) mastered prepared that is able to that will  diagnosed and treatment able to answer health concern as well as continuing care of varied medical conditions, not limited by cause, organ system, or diagnosis.   Hospital discharge  follow-up  presents the emergency department today secondary to abdominal pain, nausea, vomiting and weight loss.  Patient states he had pancreatitis well times in the past and has quit drinking since that time.  He states that over the last few weeks has had multiple episodes of burning in the bottom of his throat followed by vomiting  Alcohol-induced acute pancreatitis, unspecified complication status  alcoholic pancreatitis, colitis pain intensifies with smoking and drinking alcohol recurrent emergency room visits seen by GI.   Body mass index (BMI) 22.0-22.9, adult  Subclinical malnutrition  BMI between 22-25 with in normal range   Tobacco abuse Increased risk for lung cancer and other respiratory diseases recommend cessation.Agreed to try nicorette gum ready to quit.    Meds ordered this encounter  Medications  . nicotine polacrilex (NICORETTE) gum 2 mg    Follow-up: Return if symptoms worsen or fail to improve.    Grayce Sessions, NP

## 2020-01-02 ENCOUNTER — Ambulatory Visit (INDEPENDENT_AMBULATORY_CARE_PROVIDER_SITE_OTHER): Payer: Self-pay | Admitting: Gastroenterology

## 2020-01-02 ENCOUNTER — Encounter: Payer: Self-pay | Admitting: Gastroenterology

## 2020-01-02 VITALS — BP 120/78 | HR 78 | Temp 98.3°F | Ht 69.0 in | Wt 154.6 lb

## 2020-01-02 DIAGNOSIS — K8521 Alcohol induced acute pancreatitis with uninfected necrosis: Secondary | ICD-10-CM

## 2020-01-02 DIAGNOSIS — F1011 Alcohol abuse, in remission: Secondary | ICD-10-CM

## 2020-01-02 DIAGNOSIS — K863 Pseudocyst of pancreas: Secondary | ICD-10-CM

## 2020-01-02 MED ORDER — TRAMADOL HCL 50 MG PO TABS: 50.0000 mg | ORAL_TABLET | Freq: Four times a day (QID) | ORAL | 0 refills | Status: DC | PRN

## 2020-01-02 NOTE — Patient Instructions (Signed)
If you are age 40 or older, your body mass index should be between 23-30. Your Body mass index is 22.83 kg/m. If this is out of the aforementioned range listed, please consider follow up with your Primary Care Provider.  If you are age 61 or younger, your body mass index should be between 19-25. Your Body mass index is 22.83 kg/m. If this is out of the aformentioned range listed, please consider follow up with your Primary Care Provider.   You have been scheduled for a CT scan of the abdomen and pelvis at [facility] ([address]---this is in the same building as [employer]).   You are scheduled on 01-07-2020 at [facility] should arrive 15 minutes prior to your appointment time for registration. Please follow the written instructions below on the day of your exam:  WARNING: IF YOU ARE ALLERGIC TO IODINE/X-RAY DYE, PLEASE NOTIFY RADIOLOGY IMMEDIATELY AT [phone]! YOU WILL BE GIVEN A 13 HOUR PREMEDICATION PREP.  1) Do not eat anything after 1pm (4 hours prior to your test) 2) You have been given 2 bottles of oral contrast to drink. The solution may taste better if refrigerated, but do NOT add ice or any other liquid to this solution. Shake well before drinking.    Drink 1 bottle of contrast @ 2pm (2 hours prior to your exam)  Drink 1 bottle of contrast @ 3pm (1 hour prior to your exam)  You may take any medications as prescribed with a small amount of water, if necessary. If you take any of the following medications: METFORMIN, GLUCOPHAGE, GLUCOVANCE, AVANDAMET, RIOMET, FORTAMET, [place] MET, JANUMET, GLUMETZA or METAGLIP, you MAY be asked to HOLD this medication 48 hours AFTER the exam.  The purpose of you drinking the oral contrast is to aid in the visualization of your intestinal tract. The contrast solution may cause some diarrhea. Depending on your individual set of symptoms, you may also receive an intravenous injection of x-ray contrast/dye. Plan on being at  [facility] for 30 minutes or longer, depending on the type of exam you are having performed.  This test typically takes 30-45 minutes to complete.  If you have any questions regarding your exam or if you need to reschedule, you may call the CT department at [phone] between the hours of 8:00 am and 5:00 pm, Monday-Friday.  ________________________________________________________________________ It was a pleasure to see you today!  Dr. Loletha Carrow

## 2020-01-02 NOTE — Progress Notes (Signed)
[facility] Progress Note  Chief Complaint: Acute pancreatitis  Subjective  History: Paul Allen follows up after recent hospital stay for pancreatitis. He was seen in initial office consult by Paul Sou, PA on 11/28/2019 for recurrent alcohol-related pancreatitis with visits to the emergency department for same.  Known pancreatic pseudocysts. His pain worsened over the next several days and he was admitted to the hospital on 12/02/2019. Imaging suggested pancreatic duct disruption as well as SMV thrombosis.  He was put on octreotide antibiotics, and underwent ERCP attempt on 12/09/2019 by Dr. Christella Hartigan.  The duodenum was so inflamed the papilla could not be located. Repeat ERCP by Dr. Meridee Score on 12/11/2019 was able to cannulate the ventral pancreatic duct, sphincterotomy was performed and 5 French, 7 cm plastic stent placed.  He establish care with PCP Paul Passe, NP on 12/25/2019   Paul Allen has upper abdominal pain that is decreased from what it was during hospital stay, but at times still limits his activity and available to eat.  His bowel habits are regular and formed.  Some foods such as red meat do not seem to agree with him, and may cause nausea or abdominal pain.  He has been slowly branching out to try new foods he what agrees with him, and get sufficient protein.  He has been fatigued, and is trying to slowly get back into increased activity with walking and a modest bathroom renovation at his house.  He lost his job due to [employer] last year and has not worked since the summer. No alcohol since October, still smoking but trying to cut down and got some nicotine gum. He has been taking his Eliquis regularly and says he needs a new prescription of it. ROS: Cardiovascular:  no chest pain Respiratory: no dyspnea  The patient's Past Medical, Family and Social History were reviewed and are on file in the EMR.  Objective:  Med list reviewed  Current Outpatient Medications:  .   apixaban (ELIQUIS) 5 MG TABS tablet, Take 10 mg (2 tabs) twice daily for 1 week, then take 5 mg (1 tab) twice daily for 3 months then stop, Disp: 60 tablet, Rfl: 2 .  folic acid (FOLVITE) 1 MG tablet, Take 1 tablet (1 mg total) by mouth daily., Disp: , Rfl:  .  ondansetron (ZOFRAN) 4 MG tablet, Take 4 mg by mouth every 8 (eight) hours as needed for nausea or vomiting (SL). As needed, Disp: , Rfl:  .  Oxycodone HCl 10 MG TABS, Take by mouth. One tablet every 6 hours as needed, Disp: , Rfl:  .  pantoprazole (PROTONIX) 40 MG tablet, Take 1 tablet (40 mg total) by mouth daily at 6 (six) AM., Disp: 30 tablet, Rfl: 2 .  thiamine 100 MG tablet, Take 1 tablet (100 mg total) by mouth daily., Disp: , Rfl:  .  traMADol (ULTRAM) 50 MG tablet, Take 1 tablet (50 mg total) by mouth every 6 (six) hours as needed for up to 7 days., Disp: 28 tablet, Rfl: 0  Current Facility-Administered Medications:  .  nicotine polacrilex (NICORETTE) gum 2 mg, 2 mg, Oral, PRN, Paul Sessions, NP   Vital signs in last 24 hrs: Vitals:   01/02/20 1542  BP: 120/78  Pulse: 78  Temp: 98.3 F (36.8 C)  SpO2: 98%    Physical Exam  No acute distress, certainly nontoxic.  Pleasant and conversational.  Uncomfortable getting up on exam table  HEENT: sclera anicteric, oral mucosa moist without lesions  Neck:  supple, no thyromegaly, JVD or lymphadenopathy  Cardiac: RRR without murmurs, S1S2 heard, no peripheral edema  Pulm: clear to auscultation bilaterally, normal RR and effort noted  Abdomen: soft, upper abdominal tenderness, with active bowel sounds of normal character. No guarding or palpable hepatosplenomegaly.  Skin; warm and dry, no jaundice or rash  Recent Labs:  CBC Latest Ref Rng & Units 12/15/2019 12/14/2019 12/13/2019  WBC 4.0 - 10.5 K/uL 5.4 5.2 5.6  Hemoglobin 13.0 - 17.0 g/dL 02.7 25.3 66.4  Hematocrit 39.0 - 52.0 % 45.9 43.9 45.7  Platelets 150 - 400 K/uL 211 227 234   CMP Latest Ref Rng & Units  12/15/2019 12/14/2019 12/13/2019  Glucose 70 - 99 mg/dL 403(K) 92 742(V)  BUN 6 - 20 mg/dL <9(D) <6(L) <8(V)  Creatinine 0.61 - 1.24 mg/dL 5.64(P) 3.29(J) 1.88(C)  Sodium 135 - 145 mmol/L 137 137 137  Potassium 3.5 - 5.1 mmol/L 4.0 3.8 3.3(L)  Chloride 98 - 111 mmol/L 95(L) 100 98  CO2 22 - 32 mmol/L 27 27 27   Calcium 8.9 - 10.3 mg/dL ) 1.6(S) 0.6(T)  Total Protein 6.5 - 8.1 g/dL 6.5 0.1(S) 5.9(L)  Total Bilirubin 0.3 - 1.2 mg/dL 1.2 0.9 0.8  Alkaline Phos 38 - 126 U/L 68 60 68  AST 15 - 41 U/L 20 24 37  ALT 0 - 44 U/L 30 33 40     Radiologic studies:  CLINICAL DATA:  Pancreatitis   EXAM: CT ABDOMEN AND PELVIS WITH CONTRAST   TECHNIQUE: Multidetector CT imaging of the abdomen and pelvis was performed using the standard protocol following bolus administration of intravenous contrast.   CONTRAST:  0.1(U OMNIPAQUE IOHEXOL 300 MG/ML  SOLN   COMPARISON:  December 2 second 2020   FINDINGS: Lower chest: There are small bilateral pleural effusions, left greater than right. There is atelectasis at the lung bases.The heart size is normal.   Hepatobiliary: There is periportal edema. There is intrahepatic biliary ductal dilatation. There is a heterogeneous hypoattenuating region in hepatic segment 6/7 which is new from prior CT. The portal vein supplying this region of the liver appears to be occluded. The gallbladder is unremarkable.   Pancreas: Again noted are findings of subacute pancreatitis with pancreatic necrosis involving the pancreatic neck. There are increasing peripancreatic fluid collections.   Spleen: The spleen is borderline enlarged.   Adrenals/Urinary Tract:   --Adrenal glands: No adrenal hemorrhage.   --Right kidney/ureter: No hydronephrosis or perinephric hematoma.   --Left kidney/ureter: No hydronephrosis or perinephric hematoma.   --Urinary bladder: Unremarkable.   Stomach/Bowel:   --Stomach/Duodenum: No hiatal hernia or other gastric  abnormality. Normal duodenal course and caliber. There is a large well-formed fluid collection measuring approximately 6.5 by 7.1 cm abutting the gastric body.   --Small bowel: No dilatation or inflammation.   --Colon: Rectosigmoid diverticulosis without acute inflammation. There is diffuse wall thickening of the hepatic flexure of the colon which is felt to be reactive in etiology.   --Appendix: Not visualized. No right lower quadrant inflammation or free fluid.   Vascular/Lymphatic: Atherosclerotic calcification is present within the non-aneurysmal abdominal aorta, without hemodynamically significant stenosis. Thrombus is noted extending into the main portal vein with likely near complete occlusion of the left portal vein. There is branch occlusion involving the posterior branches of the right portal vein. There is thrombus extending into the SMV. The splenic vein remains patent. The splenic artery remains patent.   --there are some mildly enlarged reactive lymph nodes in the retroperitoneal space.   --  No mesenteric lymphadenopathy.   --No pelvic or inguinal lymphadenopathy.   Reproductive: Unremarkable   Other: There is a large volume of abdominal ascites which has significantly increased from prior study. There is mesenteric edema and haziness of the omentum. There is new diffuse body wall edema extending into the patient's scrotum.   Musculoskeletal. No acute displaced fractures.   IMPRESSION: 1. Significant interval increase in abdominal ascites and peripancreatic fluid collections. 2. Persistent nonocclusive thrombosis of the main portal vein with occlusive to near occlusive thrombus within the left portal vein. There is likely branch portal vein thrombosis involving the posterior divisions of the right hepatic lobe. There is persistent thrombus extending into the SMV. The splenic vein remains patent. 3. Persistent heterogeneous area involving hepatic segment  7/6, concerning for hepatic necrosis as seen on prior MRI. 4. Again noted are findings of pancreatitis with pancreatic necrosis of the pancreatic neck. 5. Worsening body wall edema. 6. Small bilateral pleural effusions, left greater than right. 7. Persistent wall thickening of the hepatic flexure of the colon felt to be reactive in etiology.   Aortic Atherosclerosis (ICD10-I70.0).     Electronically Signed   By: Constance Holster M.D.   On: 12/08/2019 19:18 ___________________________________  ERCP reports were also reviewed.  @[email or web address] @ Assessment: Encounter Diagnoses  Name Primary?  . Alcohol-induced acute pancreatitis with uninfected necrosis Yes  . Pancreatic pseudocyst   . History of alcohol abuse    Severe, complicated alcohol-related pancreatitis with peripancreatic fluid collections and pseudocyst, possible pancreatic duct disruption treated with ERCP and PD stent placement.  While he is much better, his inflammatory and cystic process is unlikely to be resolved at this point.  He still will have a long recovery, but he is intent on being in better health.  He is slowly increasing his oral intake and activity level.  No diarrhea to suggest malabsorption.  Plan: CT scan abdomen and pelvis with oral and IV contrast to assess the degree of residual inflammation and fluid collection as well as the SMV thrombosis.  He requested pain medication.  I offered tramadol, which she said did not help when he was first hospitalized and when he was here in clinic about a month ago.  He does report that it had been helpful at times in the past.  It is as much as I feel comfortable prescribing at this point, I think there would be dependency potential (even if unintentional) with a history of substance abuse. 28 tablets of 50 mg tramadol were prescribed.  Based on CT results, I can then communicate with Dr. Rush Landmark about possible need for repeat ERCP if PD stent is still  in place, and whether any further intervention is needed on pseudocysts.  I will copy this to his PCP so they can refill and manage his Eliquis.  Total time 30 minutes, over half spent face-to-face with patient in counseling and coordination of care.   Nelida Meuse III

## 2020-01-06 ENCOUNTER — Ambulatory Visit (INDEPENDENT_AMBULATORY_CARE_PROVIDER_SITE_OTHER)
Admission: RE | Admit: 2020-01-06 | Discharge: 2020-01-06 | Disposition: A | Discharge status: Home / Self Care | Payer: Self-pay | Source: Ambulatory Visit | Attending: Gastroenterology | Admitting: Gastroenterology

## 2020-01-06 ENCOUNTER — Other Ambulatory Visit: Payer: Self-pay

## 2020-01-06 DIAGNOSIS — K8521 Alcohol induced acute pancreatitis with uninfected necrosis: Secondary | ICD-10-CM

## 2020-01-06 DIAGNOSIS — F1011 Alcohol abuse, in remission: Secondary | ICD-10-CM

## 2020-01-06 DIAGNOSIS — K863 Pseudocyst of pancreas: Secondary | ICD-10-CM

## 2020-01-06 MED ORDER — IOHEXOL 300 MG/ML  SOLN
100.0000 mL | Freq: Once | INTRAMUSCULAR | Status: AC | PRN
  Administered 2020-01-06: 16:00:00 100 mL via INTRAVENOUS

## 2020-01-07 ENCOUNTER — Telehealth: Payer: Self-pay | Admitting: Gastroenterology

## 2020-01-07 ENCOUNTER — Encounter (HOSPITAL_COMMUNITY): Payer: Self-pay | Admitting: Emergency Medicine

## 2020-01-07 ENCOUNTER — Inpatient Hospital Stay (HOSPITAL_COMMUNITY)
Admission: EM | Admit: 2020-01-07 | Discharge: 2020-01-10 | DRG: 682 | Disposition: A | Discharge status: Home / Self Care | Payer: Self-pay | Attending: Internal Medicine | Admitting: Internal Medicine

## 2020-01-07 DIAGNOSIS — Z880 Allergy status to penicillin: Secondary | ICD-10-CM

## 2020-01-07 DIAGNOSIS — F17210 Nicotine dependence, cigarettes, uncomplicated: Secondary | ICD-10-CM | POA: Diagnosis present

## 2020-01-07 DIAGNOSIS — E876 Hypokalemia: Secondary | ICD-10-CM | POA: Diagnosis present

## 2020-01-07 DIAGNOSIS — R1013 Epigastric pain: Secondary | ICD-10-CM | POA: Diagnosis present

## 2020-01-07 DIAGNOSIS — K2960 Other gastritis without bleeding: Secondary | ICD-10-CM | POA: Diagnosis present

## 2020-01-07 DIAGNOSIS — R112 Nausea with vomiting, unspecified: Secondary | ICD-10-CM

## 2020-01-07 DIAGNOSIS — E871 Hypo-osmolality and hyponatremia: Secondary | ICD-10-CM | POA: Diagnosis present

## 2020-01-07 DIAGNOSIS — K860 Alcohol-induced chronic pancreatitis: Secondary | ICD-10-CM | POA: Diagnosis present

## 2020-01-07 DIAGNOSIS — Z79899 Other long term (current) drug therapy: Secondary | ICD-10-CM

## 2020-01-07 DIAGNOSIS — R109 Unspecified abdominal pain: Secondary | ICD-10-CM

## 2020-01-07 DIAGNOSIS — Z20822 Contact with and (suspected) exposure to covid-19: Secondary | ICD-10-CM | POA: Diagnosis present

## 2020-01-07 DIAGNOSIS — I81 Portal vein thrombosis: Secondary | ICD-10-CM | POA: Diagnosis present

## 2020-01-07 DIAGNOSIS — Z7901 Long term (current) use of anticoagulants: Secondary | ICD-10-CM

## 2020-01-07 DIAGNOSIS — Z79891 Long term (current) use of opiate analgesic: Secondary | ICD-10-CM

## 2020-01-07 DIAGNOSIS — K55069 Acute infarction of intestine, part and extent unspecified: Secondary | ICD-10-CM | POA: Diagnosis present

## 2020-01-07 DIAGNOSIS — E860 Dehydration: Secondary | ICD-10-CM | POA: Diagnosis present

## 2020-01-07 DIAGNOSIS — K863 Pseudocyst of pancreas: Secondary | ICD-10-CM | POA: Diagnosis present

## 2020-01-07 DIAGNOSIS — N179 Acute kidney failure, unspecified: Principal | ICD-10-CM | POA: Diagnosis present

## 2020-01-07 DIAGNOSIS — Z8261 Family history of arthritis: Secondary | ICD-10-CM

## 2020-01-07 DIAGNOSIS — Z800 Family history of malignant neoplasm of digestive organs: Secondary | ICD-10-CM

## 2020-01-07 LAB — COMPREHENSIVE METABOLIC PANEL
ALT: 54 U/L — ABNORMAL HIGH (ref 0–44)
AST: 41 U/L (ref 15–41)
Albumin: 5.3 g/dL — ABNORMAL HIGH (ref 3.5–5.0)
Alkaline Phosphatase: 152 U/L — ABNORMAL HIGH (ref 38–126)
Anion gap: 25 — ABNORMAL HIGH (ref 5–15)
BUN: 43 mg/dL — ABNORMAL HIGH (ref 6–20)
CO2: 33 mmol/L — ABNORMAL HIGH (ref 22–32)
Calcium: 10.5 mg/dL — ABNORMAL HIGH (ref 8.9–10.3)
Chloride: 79 mmol/L — ABNORMAL LOW (ref 98–111)
Creatinine, Ser: 1.98 mg/dL — ABNORMAL HIGH (ref 0.61–1.24)
GFR calc Af Amer: 48 mL/min — ABNORMAL LOW (ref >60)
GFR calc non Af Amer: 41 mL/min — ABNORMAL LOW (ref >60)
Glucose, Bld: 178 mg/dL — ABNORMAL HIGH (ref 70–99)
Potassium: 3.8 mmol/L (ref 3.5–5.1)
Sodium: 137 mmol/L (ref 135–145)
Total Bilirubin: 2.1 mg/dL — ABNORMAL HIGH (ref 0.3–1.2)
Total Protein: 9.2 g/dL — ABNORMAL HIGH (ref 6.5–8.1)

## 2020-01-07 LAB — CREATININE, SERUM
Creatinine, Ser: 1.96 mg/dL — ABNORMAL HIGH (ref 0.61–1.24)
GFR calc Af Amer: 48 mL/min — ABNORMAL LOW (ref >60)
GFR calc non Af Amer: 42 mL/min — ABNORMAL LOW (ref >60)

## 2020-01-07 LAB — LACTIC ACID, PLASMA
Lactic Acid, Venous: 2.8 mmol/L (ref 0.5–1.9)
Lactic Acid, Venous: 1.8 mmol/L (ref 0.5–1.9)

## 2020-01-07 LAB — LIPASE, BLOOD: Lipase: 86 U/L — ABNORMAL HIGH (ref 11–51)

## 2020-01-07 LAB — CBC
HCT: 55.9 % — ABNORMAL HIGH (ref 39.0–52.0)
Hemoglobin: 19.3 g/dL — ABNORMAL HIGH (ref 13.0–17.0)
MCH: 31.4 pg (ref 26.0–34.0)
MCHC: 34.5 g/dL (ref 30.0–36.0)
MCV: 91 fL (ref 80.0–100.0)
Platelets: 381 10*3/uL (ref 150–400)
RBC: 6.14 MIL/uL — ABNORMAL HIGH (ref 4.22–5.81)
RDW: 14.1 % (ref 11.5–15.5)
WBC: 12.1 10*3/uL — ABNORMAL HIGH (ref 4.0–10.5)
nRBC: 0 % (ref 0.0–0.2)

## 2020-01-07 MED ORDER — FOLIC ACID 5 MG/ML IJ SOLN
1.0000 mg | Freq: Every day | INTRAMUSCULAR | Status: DC
  Administered 2020-01-07 – 2020-01-10 (×3): 1 mg via INTRAVENOUS
  Filled 2020-01-07 (×5): qty 0.2

## 2020-01-07 MED ORDER — FENTANYL CITRATE (PF) 100 MCG/2ML IJ SOLN
50.0000 ug | Freq: Once | INTRAMUSCULAR | Status: AC
  Administered 2020-01-07: 50 ug via INTRAVENOUS
  Filled 2020-01-07: qty 2

## 2020-01-07 MED ORDER — HEPARIN SODIUM (PORCINE) 5000 UNIT/ML IJ SOLN
5000.0000 [IU] | Freq: Three times a day (TID) | INTRAMUSCULAR | Status: DC
  Administered 2020-01-07: 5000 [IU] via SUBCUTANEOUS
  Filled 2020-01-07: qty 1

## 2020-01-07 MED ORDER — ONDANSETRON HCL 4 MG/2ML IJ SOLN
4.0000 mg | Freq: Once | INTRAMUSCULAR | Status: AC
  Administered 2020-01-07: 4 mg via INTRAVENOUS
  Filled 2020-01-07: qty 2

## 2020-01-07 MED ORDER — SODIUM CHLORIDE 0.9 % IV BOLUS
1000.0000 mL | Freq: Once | INTRAVENOUS | Status: AC
  Administered 2020-01-07: 1000 mL via INTRAVENOUS

## 2020-01-07 MED ORDER — ONDANSETRON HCL 4 MG/2ML IJ SOLN
4.0000 mg | Freq: Four times a day (QID) | INTRAMUSCULAR | Status: DC | PRN
  Administered 2020-01-08 – 2020-01-10 (×7): 4 mg via INTRAVENOUS
  Filled 2020-01-07 (×7): qty 2

## 2020-01-07 MED ORDER — ONDANSETRON HCL 4 MG PO TABS: 4.0000 mg | ORAL_TABLET | Freq: Four times a day (QID) | ORAL | Status: DC | PRN

## 2020-01-07 MED ORDER — THIAMINE HCL 100 MG/ML IJ SOLN
100.0000 mg | Freq: Every day | INTRAMUSCULAR | Status: DC
  Administered 2020-01-07 – 2020-01-10 (×4): 100 mg via INTRAVENOUS
  Filled 2020-01-07 (×4): qty 2

## 2020-01-07 MED ORDER — DEXTROSE-NACL 5-0.9 % IV SOLN: INTRAVENOUS | Status: DC

## 2020-01-07 NOTE — Telephone Encounter (Signed)
Called patient who was packing a bag in prep to go to the ED for evaluation.

## 2020-01-07 NOTE — ED Triage Notes (Signed)
Arrives via EMS from home, C/C N/V for the last two days. Patient endorses 6 episodes of emesis today, pain in LUQ, similar to when he had pancreatitis.

## 2020-01-07 NOTE — H&P (Signed)
History and Physical   Paul CreedBrian E Strada ZOX:[id number] DOB: 04/15/1981 DOA: 01/07/2020  Referring MD/NP/PA: Dr. Freida BusmanAllen  PCP: Grayce SessionsEdwards, Michelle P, NP   Outpatient Specialists: None  Patient coming from: Home  Chief Complaint: Abdominal pain and nausea vomiting  HPI: Paul Allen is a 39 y.o. 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  Patient has been having significant abdominal pain nausea vomiting for the last 4 days.  He has not been able to keep anything down.  He was unable to take his Eliquis and other medications.  With persistent nausea vomiting his p.o. intake has decreased and patient came to the ER where he appears to be significantly dehydrated with acute kidney injury.  He denied any hematemesis or melena denied any bright red blood per rectum.  His pain is at 7 out of 10 in the mid abdomen going to his back.  Worsened by movement but not relieved by anything.  Patient being admitted with acute on chronic pancreatitis with acute kidney injury..  ED Course: Temperature 98.2 blood pressure 105/93 pulse 97 respiratory rate of 17 and oxygen sat 94% room air.  White count 12.1 hemoglobin 19.3 and platelets 381.  Sodium 137 potassium 3.8 chloride 79 CO2 33 glucose 178 BUN 43 creatinine 1.98 and calcium 10.5.  Gap of 25 alkaline phosphatase 152 albumin 5.3 lipase is 86 AST 41 and ALT 54 total protein 9.2.  CT abdomen pelvis from yesterday showed persistent occlusive left portal vein thrombosis with interval enlargement of numerous periportal venous collaterals.  Thus involving suspected near occlusive SMV thrombosis with high-grade near the portal splenic venous confluence.  Multiple other abnormal findings as dictated.  Patient will be admitted for treatment of his significant acute kidney injury, portal vein  thrombosis and pancreatitis  Review of Systems: As per HPI otherwise 10 point review of systems negative.    Past Medical History:  Diagnosis Date  . Abdominal pain   . Mesenteric vein thrombosis (HCC)    Dec 2020 in setting of pancreatitis  . Nausea & vomiting   . Pancreatitis     Past Surgical History:  Procedure Laterality Date  . APPENDECTOMY    . ERCP N/A 12/06/2019   Procedure: ENDOSCOPIC RETROGRADE CHOLANGIOPANCREATOGRAPHY (ERCP);  Surgeon: Rachael FeeJacobs, Daniel P, MD;  Location: [facility];  Service: Endoscopy;  Laterality: N/A;  Aborted Procedure  . ERCP N/A 12/11/2019   Procedure: ENDOSCOPIC RETROGRADE CHOLANGIOPANCREATOGRAPHY (ERCP);  Surgeon: Lemar LoftyMansouraty, Gabriel Jr., MD;  Location: [facility];  Service: Gastroenterology;  Laterality: N/A;  . PANCREATIC STENT PLACEMENT  12/11/2019   Procedure: PANCREATIC STENT PLACEMENT;  Surgeon: Meridee ScoreMansouraty, Netty StarringGabriel Jr., MD;  Location: [facility];  Service: Gastroenterology;;  . Dennison MascotSPHINCTEROTOMY  12/11/2019   Procedure: Dennison MascotSPHINCTEROTOMY;  Surgeon: Mansouraty, Netty StarringGabriel Jr., MD;  Location: [facility];  Service: Gastroenterology;;     reports that he has been smoking cigarettes. He has been smoking about 1.50 packs per day. He has never used smokeless tobacco. He reports previous alcohol use. He reports current drug use. Frequency: 7.00 times per week. Drug: Marijuana.  Allergies  Allergen Reactions  . Penicillins Rash    Has patient had a PCN reaction causing immediate rash, facial/tongue/throat swelling, SOB or lightheadedness with hypotension: yes Has patient had a PCN reaction causing severe rash involving mucus membranes or skin necrosis: Yes Has patient had a PCN reaction that required  hospitalization: No Has patient had a PCN reaction occurring within the last 10 years: No If all of the above answers are "NO", then may proceed with Cephalosporin use.     Family History  Problem Relation Age of Onset  . Cholecystitis Mother   .  Arthritis Mother   . Stomach cancer Father      Prior to Admission medications   Medication Sig Start Date End Date Taking? Authorizing Provider  apixaban (ELIQUIS) 5 MG TABS tablet Take 10 mg (2 tabs) twice daily for 1 week, then take 5 mg (1 tab) twice daily for 3 months then stop Patient taking differently: Take 5 mg by mouth 2 (two) times daily.  12/15/19  Yes Danford, Earl Lites, MD  folic acid (FOLVITE) 1 MG tablet Take 1 tablet (1 mg total) by mouth daily. 12/15/19  Yes Danford, Earl Lites, MD  ondansetron (ZOFRAN-ODT) 4 MG disintegrating tablet Take 4 mg by mouth every 8 (eight) hours as needed for nausea/vomiting. 12/17/19  Yes [provider]  Oxycodone HCl 10 MG TABS Take 10 mg by mouth every 6 (six) hours as needed (pain).    Yes [provider]  pantoprazole (PROTONIX) 40 MG tablet Take 1 tablet (40 mg total) by mouth daily at 6 (six) AM. 12/16/19  Yes Danford, Earl Lites, MD  thiamine 100 MG tablet Take 1 tablet (100 mg total) by mouth daily. 12/15/19  Yes Danford, Earl Lites, MD  traMADol (ULTRAM) 50 MG tablet Take 1 tablet (50 mg total) by mouth every 6 (six) hours as needed for up to 7 days. 01/02/20 01/09/20 Yes Sherrilyn Rist, MD    Physical Exam: Vitals:   01/07/20 1433 01/07/20 1436 01/07/20 1437 01/07/20 1947  BP: (!) 105/93   112/81  Pulse: 97   70  Resp: 18   19  Temp: 98.2 F (36.8 C)     TempSrc: Oral     SpO2: 99% 98%  100%  Weight:   70.1 kg   Height:   5\' 9"  (1.753 m)       Constitutional: Acutely ill looking in distress Vitals:   01/07/20 1433 01/07/20 1436 01/07/20 1437 01/07/20 1947  BP: (!) 105/93   112/81  Pulse: 97   70  Resp: 18   19  Temp: 98.2 F (36.8 C)     TempSrc: Oral     SpO2: 99% 98%  100%  Weight:   70.1 kg   Height:   5\' 9"  (1.753 m)    Eyes: PERRL, lids and conjunctivae normal ENMT: Mucous membranes are dry. Posterior pharynx clear of any exudate or lesions.Normal dentition.  Neck: normal,  supple, no masses, no thyromegaly Respiratory: clear to auscultation bilaterally, no wheezing, no crackles. Normal respiratory effort. No accessory muscle use.  Cardiovascular: Sinus tachycardia no murmurs / rubs / gallops. No extremity edema. 2+ pedal pulses. No carotid bruits.  Abdomen: Diffuse but more epigastric tenderness no masses palpated. No hepatosplenomegaly. Bowel sounds positive.  Musculoskeletal: no clubbing / cyanosis. No joint deformity upper and lower extremities. Good ROM, no contractures. Normal muscle tone.  Skin: no rashes, lesions, ulcers. No induration Neurologic: CN 2-12 grossly intact. Sensation intact, DTR normal. Strength 5/5 in all 4.  Psychiatric: Normal judgment and insight. Alert and oriented x 3. Normal mood.     Labs on Admission: I have personally reviewed following labs and imaging studies  CBC: Recent Labs  Lab 01/07/20 1440  WBC 12.1*  HGB 19.3*  HCT 55.9*  MCV 91.0  PLT 381   Basic Metabolic Panel: Recent Labs  Lab 01/07/20 1440  NA 137  K 3.8  CL 79*  CO2 33*  GLUCOSE 178*  BUN 43*  CREATININE 1.98*  CALCIUM 10.5*   GFR: Estimated Creatinine Clearance: 49.7 mL/min (A) (by C-G formula based on SCr of 1.98 mg/dL (H)). Liver Function Tests: Recent Labs  Lab 01/07/20 1440  AST 41  ALT 54*  ALKPHOS 152*  BILITOT 2.1*  PROT 9.2*  ALBUMIN 5.3*   Recent Labs  Lab 01/07/20 1440  LIPASE 86*   No results for input(s): AMMONIA in the last 168 hours. Coagulation Profile: No results for input(s): INR, PROTIME in the last 168 hours. Cardiac Enzymes: No results for input(s): CKTOTAL, CKMB, CKMBINDEX, TROPONINI in the last 168 hours. BNP (last 3 results) No results for input(s): PROBNP in the last 8760 hours. HbA1C: No results for input(s): HGBA1C in the last 72 hours. CBG: No results for input(s): GLUCAP in the last 168 hours. Lipid Profile: No results for input(s): CHOL, HDL, LDLCALC, TRIG, CHOLHDL, LDLDIRECT in the last 72  hours. Thyroid Function Tests: No results for input(s): TSH, T4TOTAL, FREET4, T3FREE, THYROIDAB in the last 72 hours. Anemia Panel: No results for input(s): VITAMINB12, FOLATE, FERRITIN, TIBC, IRON, RETICCTPCT in the last 72 hours. Urine analysis:    Component Value Date/Time   COLORURINE AMBER (A) 12/02/2019 2010   APPEARANCEUR HAZY (A) 12/02/2019 2010   LABSPEC 1.031 (H) 12/02/2019 2010   PHURINE 5.0 12/02/2019 2010   GLUCOSEU 50 (A) 12/02/2019 2010   HGBUR NEGATIVE 12/02/2019 2010   BILIRUBINUR SMALL (A) 12/02/2019 2010   KETONESUR 5 (A) 12/02/2019 2010   PROTEINUR 30 (A) 12/02/2019 2010   UROBILINOGEN 1.0 01/13/2008 2059   NITRITE NEGATIVE 12/02/2019 2010   LEUKOCYTESUR NEGATIVE 12/02/2019 2010   Sepsis Labs: @LABRCNTIP (procalcitonin:4,lacticidven:4) )No results found for this or any previous visit (from the past 240 hour(s)).   Radiological Exams on Admission: CT Abdomen Pelvis W Contrast  Result Date: 01/06/2020 CLINICAL DATA:  Inpatient. Follow-up severe acute pancreatitis status post ERCP 12/06/2019 and 12/11/2019 with stent placement. Nausea and vomiting for 3 days. Abdominal pain for several months. EXAM: CT ABDOMEN AND PELVIS WITH CONTRAST TECHNIQUE: Multidetector CT imaging of the abdomen and pelvis was performed using the standard protocol following bolus administration of intravenous contrast. CONTRAST:  12/13/2019 OMNIPAQUE IOHEXOL 300 MG/ML  SOLN COMPARISON:  12/08/2019 CT abdomen/pelvis. FINDINGS: Motion degraded scan limits assessment. Lower chest: No significant pulmonary nodules or acute consolidative airspace disease. Hepatobiliary: There is heterogeneous enhancement throughout the liver with relative central hypoenhancement, with mild interval generalized enlargement of the hypoenhancing portion of the liver. No discrete liver masses. No radiopaque cholelithiasis. No definite gallbladder wall thickening. Gallbladder is mildly distended. Mild diffuse intrahepatic biliary  ductal dilatation is similar. Normal caliber common bile duct (3 mm diameter). Pancreas: Pancreatic duct stent is in place with the distal portion in descending duodenal lumen. No pancreatic duct dilation. No discrete pancreatic mass. Pancreatic parenchymal enhancement is preserved. Pancreatic and peripancreatic edema appears decreased. Spleen: Normal size. No mass. Adrenals/Urinary Tract: Stable mildly irregular left adrenal contour without discrete adrenal nodules. No contour deforming renal masses. No hydronephrosis. Normal nondistended bladder. Stomach/Bowel: Increased smooth circumferential apparent wall thickening in the gastric antrum. Normal caliber small bowel with no small bowel wall thickening. Appendectomy. Normal large bowel with no diverticulosis, large bowel wall thickening or pericolonic fat stranding. Vascular/Lymphatic: Atherosclerotic nonaneurysmal abdominal aorta. There is high-grade narrowing of the SMV adjacent  to the portal splenic venous confluence with heterogeneous poor SMV enhancement, similar to slightly improved, suspect evolving near occlusive SMV thrombosis. Improved enhancement of the main portal and right portal veins, with decreased nonocclusive main portal and right portal vein thrombosis. Persistent occlusive thrombosis of the left portal vein with interval enlargement of numerous periportal venous collaterals. Patent splenic, renal and hepatic veins. Mildly enlarged 1.2 cm left para-aortic lymph node is stable. No new pathologically enlarged abdominopelvic lymph nodes. Reproductive: Normal size prostate. Other: No pneumoperitoneum. Resolved ascites. Resolved lesser sac fluid collections along the proximal greater curvature of the stomach. No new or residual measurable fluid collections. Musculoskeletal: No aggressive appearing focal osseous lesions. Mild thoracolumbar spondylosis. IMPRESSION: 1. Persistent occlusive left portal venous thrombosis with interval enlargement of  numerous periportal venous collaterals. Decreased nonocclusive thrombosis of the main and right portal veins. Evolving suspected near occlusive SMV thrombosis with high-grade SMV stenosis near the portal splenic venous confluence. 2. Heterogeneous central liver enhancement and enlargement, suspected to be due to altered perfusion. No discrete liver masses. 3. Mild diffuse intrahepatic biliary ductal dilatation with normal caliber CBD, unchanged. 4. Otherwise improved findings of pancreatitis. Peripancreatic fluid collections and ascites have resolved. Well-positioned pancreatic duct stent. 5. Apparent increased smooth circumferential wall thickening in the gastric antrum, nonspecific, cannot exclude antral gastritis. No free air. 6.  Aortic Atherosclerosis (ICD10-I70.0). Electronically Signed   By: Delbert Phenix M.D.   On: 01/06/2020 16:52      Assessment/Plan Principal Problem:   ARF (acute renal failure) (HCC) Active Problems:   Abdominal pain, epigastric   Portal vein thrombosis   Chronic alcoholic pancreatitis (HCC)     #1 acute kidney injury: Due to prerenal causes.  Severely dehydrated.  Also having ongoing nausea and vomiting.  We will admit the patient and aggressively hydrate.  Monitor response and kidney function.  If no improvement will get renal ultrasound.  He had CT abdomen yesterday showing no evidence of urinary obstruction.  No hydronephrosis or stone.  Continue management.  #2 portal vein and SMV thrombosis: Patient has been on Eliquis that he is not been able to take due to nausea and vomiting.  Initiate Lovenox subcutaneously twice daily while he is still n.p.o.  #3 intractable nausea and vomiting: Secondary to acute pancreatitis and portal vein thrombosis.  Continue supportive care and antiemetics.  #4 acute on chronic pancreatitis: Patient reported quitting alcohol months ago and he has been doing fine.  Will rest of bowel, pain medication, symptomatic management with IV  fluids   DVT prophylaxis: Lovenox Code Status: Full code Family Communication: No family at bedside Disposition Plan: Home Consults called: None but GI may be consulted in the morning Admission status: Inpatient  Severity of Illness: The appropriate patient status for this patient is INPATIENT. Inpatient status is judged to be reasonable and necessary in order to provide the required intensity of service to ensure the patient's safety. The patient's presenting symptoms, physical exam findings, and initial radiographic and laboratory data in the context of their chronic comorbidities is felt to place them at high risk for further clinical deterioration. Furthermore, it is not anticipated that the patient will be medically stable for discharge from the hospital within 2 midnights of admission. The following factors support the patient status of inpatient.   " The patient's presenting symptoms include nausea vomiting abdominal pain. " The worrisome physical exam findings include epigastric tenderness. " The initial radiographic and laboratory data are worrisome because of elevated lipase and CT evidence  of portal vein thrombosis. " The chronic co-morbidities include chronic pancreatitis.   * I certify that at the point of admission it is my clinical judgment that the patient will require inpatient hospital care spanning beyond 2 midnights from the point of admission due to high intensity of service, high risk for further deterioration and high frequency of surveillance required.Barbette Merino MD [facility] Pager [phone]  If 7PM-7AM, please contact night-coverage [email or web address] Password [email or web address]  01/07/2020, 9:15 PM

## 2020-01-07 NOTE — Telephone Encounter (Signed)
Spoke to the patient who reported continued episodes of daily vomiting since Saturday. He reports he has not been able to keep food or fluids down since Saturday. He's vomited 4 times since midnight and continued to vomit and wretch while on the phone with me. He reports feels dizzy, faint and confused. LUQ pain and "spasms" that range on the 1-10 pain scale from "6 to 12." He reports lying flat induces vomiting. "My body is telling me something is wrong," he said while on the phone. He reports decreased urine output saying "I've peed a few drips 3 times today." He reports his last episode of normal urine output was yesterday morning. The patient was encourage to go to the ED for evaluation. He is alone so he was told for safety reasons to either call someone to take him or call the ambulance due to his purported confusion/neuro symptoms. He stated he felt like this was pancreatitis and expressed that the same symptoms immediately prior to his last hospitalization.

## 2020-01-07 NOTE — Telephone Encounter (Signed)
Yes he definitely needs to go to the emergency department.  He recently had a complex hospital course with severe pancreatitis and fluid collections.  That process may have worsened.

## 2020-01-07 NOTE — ED Provider Notes (Signed)
[facility] DEPT Provider Note   CSN: [id number] Arrival date & time: 01/07/20  1427     History Chief Complaint  Patient presents with  . Abdominal Pain  . Emesis    Paul Allen is a 39 y.o. male past medical history of pancreatitis, SMV and portal vein thrombosis who presents for evaluation of 4 days of worsening abdominal pain, nausea/vomiting.  He was hospitalized in December when he was found to have pancreatitis as well as SMV and portal vein thrombosis.  He was started on Eliquis which he states he has been taking.  He had been feeling better upon discharge from the hospital up and for last 4 days.  He states he has had persistent nausea/vomiting and has had difficulty tolerating any p.o.  States he started developing some abdominal pain that he describes as dull and sharp cramping.  States is mostly in the left upper quadrant.  He has not had any diarrhea.  No blood in vomit, no blood in stools.  He has not noted any fevers.  He saw GI last week who ordered a follow-up CT scan which she had done yesterday.  He states he has been compliant with his Eliquis. No fevers, CP, SOB, urinary symptoms.    The history is provided by the patient.       Past Medical History:  Diagnosis Date  . Abdominal pain   . Mesenteric vein thrombosis ([place])    Dec 2020 in setting of pancreatitis  . Nausea & vomiting   . Pancreatitis     Patient Active Problem List   Diagnosis Date Noted  . Chronic alcoholic pancreatitis ([place]) 01/07/2020  . ARF (acute renal failure) ([place]) 01/07/2020  . Pancreatic duct leak   . Choledocholithiasis   . Bile leak   . Pancreatic ascites   . Abdominal pain, epigastric   . Portal vein thrombosis   . Pancreatitis, acute 12/03/2019  . Elevated LFTs   . Elevated hemoglobin ([place])   . Elevated bilirubin 11/28/2019  . Effusion, right knee   . Acute pancreatitis 10/25/2018  . Acute alcoholic pancreatitis 88/82/8003  . Colitis  10/24/2018  . Hyponatremia 10/24/2018  . Hypokalemia 10/24/2018  . Macrocytosis 10/24/2018  . Alcohol abuse 10/24/2018    Past Surgical History:  Procedure Laterality Date  . APPENDECTOMY    . ERCP N/A 12/06/2019   Procedure: ENDOSCOPIC RETROGRADE CHOLANGIOPANCREATOGRAPHY (ERCP);  Surgeon: Milus Banister, MD;  Location: [facility];  Service: Endoscopy;  Laterality: N/A;  Aborted Procedure  . ERCP N/A 12/11/2019   Procedure: ENDOSCOPIC RETROGRADE CHOLANGIOPANCREATOGRAPHY (ERCP);  Surgeon: Irving Copas., MD;  Location: [facility];  Service: Gastroenterology;  Laterality: N/A;  . PANCREATIC STENT PLACEMENT  12/11/2019   Procedure: PANCREATIC STENT PLACEMENT;  Surgeon: Irving Copas., MD;  Location: [facility];  Service: Gastroenterology;;  . Joan Mayans  12/11/2019   Procedure: Joan Mayans;  Surgeon: Rush Landmark Telford Nab., MD;  Location: [facility];  Service: Gastroenterology;;       Family History  Problem Relation Age of Onset  . Cholecystitis Mother   . Arthritis Mother   . Stomach cancer Father     Social History   Tobacco Use  . Smoking status: Current Every Day Smoker    Packs/day: 1.50    Types: Cigarettes  . Smokeless tobacco: Never Used  Substance Use Topics  . Alcohol use: Not Currently    Comment: hasn't drank in 4 weeks, but before would drink about 5 malt beers a  day  . Drug use: Yes    Frequency: 7.0 times per week    Types: Marijuana    Home Medications Prior to Admission medications   Medication Sig Start Date End Date Taking? Authorizing Provider  apixaban (ELIQUIS) 5 MG TABS tablet Take 10 mg (2 tabs) twice daily for 1 week, then take 5 mg (1 tab) twice daily for 3 months then stop Patient taking differently: Take 5 mg by mouth 2 (two) times daily.  12/15/19  Yes Danford, Suann Larry, MD  folic acid (FOLVITE) 1 MG tablet Take 1 tablet (1 mg total) by mouth daily. 12/15/19  Yes Danford, Suann Larry, MD    ondansetron (ZOFRAN-ODT) 4 MG disintegrating tablet Take 4 mg by mouth every 8 (eight) hours as needed for nausea/vomiting. 12/17/19  Yes [provider]  Oxycodone HCl 10 MG TABS Take 10 mg by mouth every 6 (six) hours as needed (pain).    Yes [provider]  pantoprazole (PROTONIX) 40 MG tablet Take 1 tablet (40 mg total) by mouth daily at 6 (six) AM. 12/16/19  Yes Danford, Suann Larry, MD  thiamine 100 MG tablet Take 1 tablet (100 mg total) by mouth daily. 12/15/19  Yes Danford, Suann Larry, MD  traMADol (ULTRAM) 50 MG tablet Take 1 tablet (50 mg total) by mouth every 6 (six) hours as needed for up to 7 days. 01/02/20 01/09/20 Yes Danis, Kirke Corin, MD    Allergies    Penicillins  Review of Systems   Review of Systems  Constitutional: Negative for fever.  Respiratory: Negative for cough and shortness of breath.   Cardiovascular: Negative for chest pain.  Gastrointestinal: Positive for abdominal pain, nausea and vomiting.  Genitourinary: Negative for dysuria and hematuria.  Neurological: Negative for headaches.  All other systems reviewed and are negative.   Physical Exam Updated Vital Signs BP 112/81   Pulse 70   Temp 98.2 F (36.8 C) (Oral)   Resp 19   Ht '5\' 9"'  (1.753 m)   Wt 70.1 kg   SpO2 100%   BMI 22.83 kg/m   Physical Exam Vitals and nursing note reviewed.  Constitutional:      Appearance: Normal appearance. He is well-developed.     Comments: Appears uncomfortable on exam. But NAD.   HENT:     Head: Normocephalic and atraumatic.     Mouth/Throat:     Mouth: Mucous membranes are dry.  Eyes:     General: Lids are normal.     Conjunctiva/sclera: Conjunctivae normal.     Pupils: Pupils are equal, round, and reactive to light.  Cardiovascular:     Rate and Rhythm: Normal rate and regular rhythm.     Pulses: Normal pulses.     Heart sounds: Normal heart sounds. No murmur. No friction rub. No gallop.   Pulmonary:     Effort: Pulmonary  effort is normal.     Breath sounds: Normal breath sounds.     Comments: Lungs clear to auscultation bilaterally.  Symmetric chest rise.  No wheezing, rales, rhonchi.  Abdominal:     Palpations: Abdomen is soft. Abdomen is not rigid.     Tenderness: There is abdominal tenderness in the right upper quadrant, epigastric area and left upper quadrant. There is no guarding.     Comments: Diffuse tenderness in upper abdomen but worse in the left upper quadrant.  Musculoskeletal:        General: Normal range of motion.     Cervical back: Full  passive range of motion without pain.  Skin:    General: Skin is warm and dry.     Capillary Refill: Capillary refill takes less than 2 seconds.  Neurological:     Mental Status: He is alert and oriented to person, place, and time.  Psychiatric:        Speech: Speech normal.     ED Results / Procedures / Treatments   Labs (all labs ordered are listed, but only abnormal results are displayed) Labs Reviewed  LIPASE, BLOOD - Abnormal; Notable for the following components:      Result Value   Lipase 86 (*)    All other components within normal limits  COMPREHENSIVE METABOLIC PANEL - Abnormal; Notable for the following components:   Chloride 79 (*)    CO2 33 (*)    Glucose, Bld 178 (*)    BUN 43 (*)    Creatinine, Ser 1.98 (*)    Calcium 10.5 (*)    Total Protein 9.2 (*)    Albumin 5.3 (*)    ALT 54 (*)    Alkaline Phosphatase 152 (*)    Total Bilirubin 2.1 (*)    GFR calc non Af Amer 41 (*)    GFR calc Af Amer 48 (*)    Anion gap 25 (*)    All other components within normal limits  CBC - Abnormal; Notable for the following components:   WBC 12.1 (*)    RBC 6.14 (*)    Hemoglobin 19.3 (*)    HCT 55.9 (*)    All other components within normal limits  LACTIC ACID, PLASMA - Abnormal; Notable for the following components:   Lactic Acid, Venous 2.8 (*)    All other components within normal limits  SARS CORONAVIRUS 2 (TAT 6-24 HRS)    URINALYSIS, ROUTINE W REFLEX MICROSCOPIC  LACTIC ACID, PLASMA    EKG None  Radiology CT Abdomen Pelvis W Contrast  Result Date: 01/06/2020 CLINICAL DATA:  Inpatient. Follow-up severe acute pancreatitis status post ERCP 12/06/2019 and 12/11/2019 with stent placement. Nausea and vomiting for 3 days. Abdominal pain for several months. EXAM: CT ABDOMEN AND PELVIS WITH CONTRAST TECHNIQUE: Multidetector CT imaging of the abdomen and pelvis was performed using the standard protocol following bolus administration of intravenous contrast. CONTRAST:  162m OMNIPAQUE IOHEXOL 300 MG/ML  SOLN COMPARISON:  12/08/2019 CT abdomen/pelvis. FINDINGS: Motion degraded scan limits assessment. Lower chest: No significant pulmonary nodules or acute consolidative airspace disease. Hepatobiliary: There is heterogeneous enhancement throughout the liver with relative central hypoenhancement, with mild interval generalized enlargement of the hypoenhancing portion of the liver. No discrete liver masses. No radiopaque cholelithiasis. No definite gallbladder wall thickening. Gallbladder is mildly distended. Mild diffuse intrahepatic biliary ductal dilatation is similar. Normal caliber common bile duct (3 mm diameter). Pancreas: Pancreatic duct stent is in place with the distal portion in descending duodenal lumen. No pancreatic duct dilation. No discrete pancreatic mass. Pancreatic parenchymal enhancement is preserved. Pancreatic and peripancreatic edema appears decreased. Spleen: Normal size. No mass. Adrenals/Urinary Tract: Stable mildly irregular left adrenal contour without discrete adrenal nodules. No contour deforming renal masses. No hydronephrosis. Normal nondistended bladder. Stomach/Bowel: Increased smooth circumferential apparent wall thickening in the gastric antrum. Normal caliber small bowel with no small bowel wall thickening. Appendectomy. Normal large bowel with no diverticulosis, large bowel wall thickening or  pericolonic fat stranding. Vascular/Lymphatic: Atherosclerotic nonaneurysmal abdominal aorta. There is high-grade narrowing of the SMV adjacent to the portal splenic venous confluence with heterogeneous poor SMV  enhancement, similar to slightly improved, suspect evolving near occlusive SMV thrombosis. Improved enhancement of the main portal and right portal veins, with decreased nonocclusive main portal and right portal vein thrombosis. Persistent occlusive thrombosis of the left portal vein with interval enlargement of numerous periportal venous collaterals. Patent splenic, renal and hepatic veins. Mildly enlarged 1.2 cm left para-aortic lymph node is stable. No new pathologically enlarged abdominopelvic lymph nodes. Reproductive: Normal size prostate. Other: No pneumoperitoneum. Resolved ascites. Resolved lesser sac fluid collections along the proximal greater curvature of the stomach. No new or residual measurable fluid collections. Musculoskeletal: No aggressive appearing focal osseous lesions. Mild thoracolumbar spondylosis. IMPRESSION: 1. Persistent occlusive left portal venous thrombosis with interval enlargement of numerous periportal venous collaterals. Decreased nonocclusive thrombosis of the main and right portal veins. Evolving suspected near occlusive SMV thrombosis with high-grade SMV stenosis near the portal splenic venous confluence. 2. Heterogeneous central liver enhancement and enlargement, suspected to be due to altered perfusion. No discrete liver masses. 3. Mild diffuse intrahepatic biliary ductal dilatation with normal caliber CBD, unchanged. 4. Otherwise improved findings of pancreatitis. Peripancreatic fluid collections and ascites have resolved. Well-positioned pancreatic duct stent. 5. Apparent increased smooth circumferential wall thickening in the gastric antrum, nonspecific, cannot exclude antral gastritis. No free air. 6.  Aortic Atherosclerosis (ICD10-I70.0). Electronically Signed    By: Ilona Sorrel M.D.   On: 01/06/2020 16:52    Procedures Procedures (including critical care time)  Medications Ordered in ED Medications  sodium chloride 0.9 % bolus 1,000 mL (1,000 mLs Intravenous New Bag/Given 01/07/20 1940)  ondansetron (ZOFRAN) injection 4 mg (4 mg Intravenous Given 01/07/20 1940)  fentaNYL (SUBLIMAZE) injection 50 mcg (50 mcg Intravenous Given 01/07/20 1940)    ED Course  I have reviewed the triage vital signs and the nursing notes.  Pertinent labs & imaging results that were available during my care of the patient were reviewed by me and considered in my medical decision making (see chart for details).    MDM Rules/Calculators/A&P                      39 year old male with past history of pancreatitis, SMV thrombosis currently on Eliquis who presents for evaluation of 4 days of worsening nausea/vomiting, abdominal pain.  On initial ED arrival, he is afebrile, appears uncomfortable but no acute distress.  Blood pressure is slightly low.  On exam, he has tenderness diffusely noted upper abdomen is slightly worse in the left upper quadrant.  Concern for infectious etiology versus dehydration.  Plan to check labs.  He had a CT scan done yesterday that showed persistent occlusive left portal venous thrombosis with interval enlargement of numerous.  Portal venous collaterals.  There is mention of involving suspected near occlusive SMV thrombosis with high-grade SMA stenosis.  He has heterogenous central liver enhancement enlargement suspected due to altered perfusion.  Lipase is elevated 86 but this actually improvement from his previous.  CBC shows leukocytosis of 12.1.  Hemoglobin is 19.3.  CMP shows bicarb of 33, BUN of 43, creatinine of 1.98.  His AST/ALT are slightly elevated at 54.  His alk phos is slightly elevated at 152 and total bili is 2.1.  He has an anion gap of 25.  Lactic is slightly elevated.  I suspect this is more likely from dehydration.  At this time,  given worsening AKI, worsening findings on CT scan, feel that he needs admission for hydration as well as GI consult.  Discussed patient with Dr. Jonelle Sidle (  hospitalist) who is agreeable to plan.  Portions of this note were generated with [employer]. Dictation errors may occur despite best attempts at proofreading.  Final Clinical Impression(s) / ED Diagnoses Final diagnoses:  AKI (acute kidney injury) ([place])  Non-intractable vomiting with nausea, unspecified vomiting type    Rx / DC Orders ED Discharge Orders    None      Portions of this note were generated with Dragon dictation software. Dictation errors may occur despite best attempts at proofreading.   Volanda Napoleon, PA-C 01/07/20 2115    Margette Fast, MD 01/09/20 1728

## 2020-01-07 NOTE — Telephone Encounter (Signed)
Patient is calling seeking advise states he can not eat nor drink anything.

## 2020-01-08 ENCOUNTER — Other Ambulatory Visit: Payer: Self-pay

## 2020-01-08 LAB — COMPREHENSIVE METABOLIC PANEL
ALT: 42 U/L (ref 0–44)
AST: 31 U/L (ref 15–41)
Albumin: 4.1 g/dL (ref 3.5–5.0)
Alkaline Phosphatase: 107 U/L (ref 38–126)
Anion gap: 13 (ref 5–15)
BUN: 50 mg/dL — ABNORMAL HIGH (ref 6–20)
CO2: 33 mmol/L — ABNORMAL HIGH (ref 22–32)
Calcium: 8.4 mg/dL — ABNORMAL LOW (ref 8.9–10.3)
Chloride: 89 mmol/L — ABNORMAL LOW (ref 98–111)
Creatinine, Ser: 1.57 mg/dL — ABNORMAL HIGH (ref 0.61–1.24)
GFR calc Af Amer: >60 mL/min (ref >60)
GFR calc non Af Amer: 55 mL/min — ABNORMAL LOW (ref >60)
Glucose, Bld: 141 mg/dL — ABNORMAL HIGH (ref 70–99)
Potassium: 2.7 mmol/L — CL (ref 3.5–5.1)
Sodium: 135 mmol/L (ref 135–145)
Total Bilirubin: 1.4 mg/dL — ABNORMAL HIGH (ref 0.3–1.2)
Total Protein: 7 g/dL (ref 6.5–8.1)

## 2020-01-08 LAB — URINALYSIS, ROUTINE W REFLEX MICROSCOPIC
Bilirubin Urine: NEGATIVE
Glucose, UA: NEGATIVE mg/dL
Hgb urine dipstick: NEGATIVE
Ketones, ur: 5 mg/dL — AB
Leukocytes,Ua: NEGATIVE
Nitrite: NEGATIVE
Protein, ur: 30 mg/dL — AB
Specific Gravity, Urine: 1.023 (ref 1.005–1.030)
pH: 5 (ref 5.0–8.0)

## 2020-01-08 LAB — CBC
HCT: 45.6 % (ref 39.0–52.0)
Hemoglobin: 15.8 g/dL (ref 13.0–17.0)
MCH: 31.6 pg (ref 26.0–34.0)
MCHC: 34.6 g/dL (ref 30.0–36.0)
MCV: 91.2 fL (ref 80.0–100.0)
Platelets: 262 10*3/uL (ref 150–400)
RBC: 5 MIL/uL (ref 4.22–5.81)
RDW: 13.8 % (ref 11.5–15.5)
WBC: 10.4 10*3/uL (ref 4.0–10.5)
nRBC: 0 % (ref 0.0–0.2)

## 2020-01-08 LAB — SARS CORONAVIRUS 2 (TAT 6-24 HRS): SARS Coronavirus 2: NEGATIVE

## 2020-01-08 MED ORDER — POTASSIUM CHLORIDE 10 MEQ/100ML IV SOLN
10.0000 meq | INTRAVENOUS | Status: AC
  Administered 2020-01-08 (×4): 10 meq via INTRAVENOUS
  Filled 2020-01-08 (×4): qty 100

## 2020-01-08 MED ORDER — SODIUM CHLORIDE 0.9 % IV SOLN: INTRAVENOUS | Status: DC

## 2020-01-08 MED ORDER — HYDROMORPHONE HCL 1 MG/ML IJ SOLN
1.0000 mg | INTRAMUSCULAR | Status: DC | PRN
  Administered 2020-01-08 – 2020-01-09 (×6): 1 mg via INTRAVENOUS
  Filled 2020-01-08 (×6): qty 1

## 2020-01-08 MED ORDER — ENOXAPARIN SODIUM 80 MG/0.8ML ~~LOC~~ SOLN
1.0000 mg/kg | Freq: Two times a day (BID) | SUBCUTANEOUS | Status: DC
  Administered 2020-01-08 – 2020-01-10 (×5): 70 mg via SUBCUTANEOUS
  Filled 2020-01-08: qty 0.8
  Filled 2020-01-08 (×2): qty 0.7
  Filled 2020-01-08 (×3): qty 0.8

## 2020-01-08 NOTE — Progress Notes (Signed)
PROGRESS NOTE  Paul Allen WLN:[id number] DOB: [birth year] DOA: 01/07/2020 PCP: Grayce Sessions, NP  HPI/Recap of past 24 hours: HPI from Dr Peterson Ao is a 39 y.o. 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 Patient has been having significant abdominal pain nausea vomiting for the last 4 days. With persistent nausea vomiting his p.o. intake has decreased and patient came to the ER where he appears to be significantly dehydrated with acute kidney injury. Patient being admitted with acute on chronic pancreatitis with acute kidney injury. In the ED, BUN 43 creatinine 1.98 and calcium 10.5, lipase is 86 AST 41 and ALT 54 total protein 9.2.  CT abdomen pelvis from yesterday showed persistent occlusive left portal vein thrombosis with interval enlargement of numerous periportal venous collaterals.    Today, patient denies any new complaints, reports improved nausea/vomiting, but still with abdominal pain  Assessment/Plan: Principal Problem:   ARF (acute renal failure) ([facility]) Active Problems:   Abdominal pain, epigastric   Portal vein thrombosis   Chronic alcoholic pancreatitis ([facility])   Intractable nausea/vomiting/abdominal pain Likely 2/2 portal vein thrombosis versus chronic pancreatitis CT abdomen/pelvis showed persistent occlusive left portal vein thrombosis, with interval enlargement of numerous periportal venous collaterals, involving suspected near occlusive SMV thrombosis with high-grade SMV stenosis, possible antral gastritis Continue supportive care, antiemetics  AKI Improving, baseline creatinine normal, on admission 1.98 Likely 2/2 above Continue IV fluids Daily BMP  Hypokalemia Replace as needed  Portal vein and SMV thrombosis Was unable to take Eliquis due to  nausea/vomiting Continue therapeutic Lovenox [place] twice daily         Malnutrition Type:      Malnutrition Characteristics:      Nutrition Interventions:       Estimated body mass index is 22.83 kg/m as calculated from the following:   Height as of this encounter: 5\' 9"  (1.753 m).   Weight as of this encounter: 70.1 kg.     Code Status: Full  Family Communication: None at bedside  Disposition Plan: Home   Consultants:  None  Procedures:  None  Antimicrobials:  None  DVT prophylaxis: Lovenox   Objective: Vitals:   01/08/20 0900 01/08/20 1000 01/08/20 1009 01/08/20 1100  BP: 124/87 (!) 125/92 (!) 125/92 102/82  Pulse: (!) 59 62 64 (!) 51  Resp:   15   Temp:      TempSrc:      SpO2: 96% 99% 96% 96%  Weight:      Height:        Intake/Output Summary (Last 24 hours) at 01/08/2020 1347 Last data filed at 01/08/2020 1115 Gross per 24 hour  Intake 2785.52 ml  Output 200 ml  Net 2585.52 ml   Filed Weights   01/07/20 1437  Weight: 70.1 kg    Exam:  General: NAD   Cardiovascular: S1, S2 present  Respiratory: CTAB  Abdomen: Soft, +tender, nondistended, bowel sounds present  Musculoskeletal: No bilateral pedal edema noted  Skin: Normal  Psychiatry: Normal mood   Data Reviewed: CBC: Recent Labs  Lab 01/07/20 1440 01/08/20 0400  WBC 12.1* 10.4  HGB 19.3* 15.8  HCT 55.9* 45.6  MCV 91.0 91.2  PLT 381 262   Basic Metabolic Panel: Recent Labs  Lab 01/07/20 1440 01/08/20 0400  NA 137 135  K 3.8 2.7*  CL 79* 89*  CO2 33* 33*  GLUCOSE 178* 141*  BUN 43* 50*  CREATININE 1.96*  1.98* 1.57*  CALCIUM 10.5* 8.4*   GFR: Estimated Creatinine Clearance: 62.6 mL/min (A) (by C-G formula based on SCr of 1.57 mg/dL (H)). Liver Function Tests: Recent Labs  Lab 01/07/20 1440 01/08/20 0400  AST 41 31  ALT 54* 42  ALKPHOS 152* 107  BILITOT 2.1* 1.4*  PROT 9.2* 7.0  ALBUMIN 5.3* 4.1   Recent Labs  Lab 01/07/20 1440   LIPASE 86*   No results for input(s): AMMONIA in the last 168 hours. Coagulation Profile: No results for input(s): INR, PROTIME in the last 168 hours. Cardiac Enzymes: No results for input(s): CKTOTAL, CKMB, CKMBINDEX, TROPONINI in the last 168 hours. BNP (last 3 results) No results for input(s): PROBNP in the last 8760 hours. HbA1C: No results for input(s): HGBA1C in the last 72 hours. CBG: No results for input(s): GLUCAP in the last 168 hours. Lipid Profile: No results for input(s): CHOL, HDL, LDLCALC, TRIG, CHOLHDL, LDLDIRECT in the last 72 hours. Thyroid Function Tests: No results for input(s): TSH, T4TOTAL, FREET4, T3FREE, THYROIDAB in the last 72 hours. Anemia Panel: No results for input(s): VITAMINB12, FOLATE, FERRITIN, TIBC, IRON, RETICCTPCT in the last 72 hours. Urine analysis:    Component Value Date/Time   COLORURINE AMBER (A) 01/08/2020 0040   APPEARANCEUR CLOUDY (A) 01/08/2020 0040   LABSPEC 1.023 01/08/2020 0040   PHURINE 5.0 01/08/2020 0040   GLUCOSEU NEGATIVE 01/08/2020 0040   HGBUR NEGATIVE 01/08/2020 0040   BILIRUBINUR NEGATIVE 01/08/2020 0040   KETONESUR 5 (A) 01/08/2020 0040   PROTEINUR 30 (A) 01/08/2020 0040   UROBILINOGEN 1.0 01/13/2008 2059   NITRITE NEGATIVE 01/08/2020 0040   LEUKOCYTESUR NEGATIVE 01/08/2020 0040   Sepsis Labs: @LABRCNTIP (procalcitonin:4,lacticidven:4)  ) Recent Results (from the past 240 hour(s))  SARS CORONAVIRUS 2 (TAT 6-24 HRS) Nasopharyngeal Nasopharyngeal Swab     Status: None   Collection Time: 01/07/20  8:44 PM   Specimen: Nasopharyngeal Swab  Result Value Ref Range Status   SARS Coronavirus 2 NEGATIVE NEGATIVE Final    Comment: (NOTE) SARS-CoV-2 target nucleic acids are NOT DETECTED. The SARS-CoV-2 RNA is generally detectable in upper and lower respiratory specimens during the acute phase of infection. Negative results do not preclude SARS-CoV-2 infection, do not rule out co-infections with other pathogens, and  should not be used as the sole basis for treatment or other patient management decisions. Negative results must be combined with clinical observations, patient history, and epidemiological information. The expected result is Negative. Fact Sheet for Patients: 01/09/20 Fact Sheet for Healthcare Providers: HairSlick.no This test is not yet approved or cleared by the quierodirigir.com FDA and  has been authorized for detection and/or diagnosis of SARS-CoV-2 by FDA under an Emergency Use Authorization (EUA). This EUA will remain  in effect (meaning this test can be used) for the duration of the COVID-19 declaration under Section 56 4(b)(1) of the Act, 21 U.S.C. section 360bbb-3(b)(1), unless the authorization is terminated or revoked sooner. Performed at [facility], [address] [place], [place]       Studies: No results found.  Scheduled Meds: . enoxaparin (LOVENOX) injection  1 mg/kg Subcutaneous Q12H  . folic acid  1 mg Intravenous Daily  . thiamine injection  100 mg Intravenous Daily    Continuous Infusions: . dextrose 5 % and 0.9% NaCl 125 mL/hr at 01/08/20 0802     LOS: 1 day     01/10/20  Horris Latino, MD [facility]  If 7PM-7AM, please contact night-coverage [email or web address] 01/08/2020, 1:47 PM

## 2020-01-08 NOTE — Progress Notes (Signed)
ANTICOAGULATION CONSULT NOTE - Initial Consult  Pharmacy Consult for Lovenox Indication: VTE treatment  Allergies  Allergen Reactions  . Penicillins Rash    Has patient had a PCN reaction causing immediate rash, facial/tongue/throat swelling, SOB or lightheadedness with hypotension: yes Has patient had a PCN reaction causing severe rash involving mucus membranes or skin necrosis: Yes Has patient had a PCN reaction that required hospitalization: No Has patient had a PCN reaction occurring within the last 10 years: No If all of the above answers are "NO", then may proceed with Cephalosporin use.     Patient Measurements: Height: 5\' 9"  (175.3 cm) Weight: 154 lb 9.6 oz (70.1 kg) IBW/kg (Calculated) : 70.7   Vital Signs: Temp: 98.2 F (36.8 C) (01/19 1433) Temp Source: Oral (01/19 1433) BP: 131/89 (01/20 0030) Pulse Rate: 64 (01/20 0030)  Labs: Recent Labs    01/07/20 1440  HGB 19.3*  HCT 55.9*  PLT 381  CREATININE 1.96*  1.98*    Estimated Creatinine Clearance: 49.7 mL/min (A) (by C-G formula based on SCr of 1.98 mg/dL (H)).   Medical History: Past Medical History:  Diagnosis Date  . Abdominal pain   . Mesenteric vein thrombosis ([facility])    Dec 2020 in setting of pancreatitis  . Nausea & vomiting   . Pancreatitis     Medications:  Scheduled:  . enoxaparin (LOVENOX) injection  1 mg/kg Subcutaneous Q12H  . folic acid  1 mg Intravenous Daily  . thiamine injection  100 mg Intravenous Daily   Infusions:  . dextrose 5 % and 0.9% NaCl 125 mL/hr at 01/07/20 2159    Assessment: 38 yoM admitted with abdominal pain, n/v on eliquis at home for portal vein thrombosis and SMV thrombosis. LD eliquis 1/19 0700. MD transitioning to Lovenox while patient has acute n/v and is NPO.   Goal of Therapy:  Anti-Xa level 0.6-1 units/ml 4hrs after LMWH dose given    Plan:  Lovenox 70 mg sq q12h F/u scr  2/19 R 01/08/2020,1:29 AM

## 2020-01-09 ENCOUNTER — Inpatient Hospital Stay (HOSPITAL_COMMUNITY): Payer: Self-pay

## 2020-01-09 LAB — CBC WITH DIFFERENTIAL/PLATELET
Abs Immature Granulocytes: 0.02 10*3/uL (ref 0.00–0.07)
Basophils Absolute: 0.1 10*3/uL (ref 0.0–0.1)
Basophils Relative: 1 %
Eosinophils Absolute: 0.2 10*3/uL (ref 0.0–0.5)
Eosinophils Relative: 3 %
HCT: 43.5 % (ref 39.0–52.0)
Hemoglobin: 14.5 g/dL (ref 13.0–17.0)
Immature Granulocytes: 0 %
Lymphocytes Relative: 30 %
Lymphs Abs: 2.3 10*3/uL (ref 0.7–4.0)
MCH: 31.1 pg (ref 26.0–34.0)
MCHC: 33.3 g/dL (ref 30.0–36.0)
MCV: 93.3 fL (ref 80.0–100.0)
Monocytes Absolute: 0.7 10*3/uL (ref 0.1–1.0)
Monocytes Relative: 9 %
Neutro Abs: 4.4 10*3/uL (ref 1.7–7.7)
Neutrophils Relative %: 57 %
Platelets: 176 10*3/uL (ref 150–400)
RBC: 4.66 MIL/uL (ref 4.22–5.81)
RDW: 13.5 % (ref 11.5–15.5)
WBC: 7.7 10*3/uL (ref 4.0–10.5)
nRBC: 0 % (ref 0.0–0.2)

## 2020-01-09 LAB — BASIC METABOLIC PANEL
Anion gap: 11 (ref 5–15)
BUN: 26 mg/dL — ABNORMAL HIGH (ref 6–20)
CO2: 27 mmol/L (ref 22–32)
Calcium: 8.6 mg/dL — ABNORMAL LOW (ref 8.9–10.3)
Chloride: 95 mmol/L — ABNORMAL LOW (ref 98–111)
Creatinine, Ser: 0.91 mg/dL (ref 0.61–1.24)
GFR calc Af Amer: >60 mL/min (ref >60)
GFR calc non Af Amer: >60 mL/min (ref >60)
Glucose, Bld: 84 mg/dL (ref 70–99)
Potassium: 3.2 mmol/L — ABNORMAL LOW (ref 3.5–5.1)
Sodium: 133 mmol/L — ABNORMAL LOW (ref 135–145)

## 2020-01-09 MED ORDER — HYDROCODONE-ACETAMINOPHEN 5-325 MG PO TABS
1.0000 | ORAL_TABLET | ORAL | Status: DC | PRN
  Administered 2020-01-09 – 2020-01-10 (×2): 2 via ORAL
  Filled 2020-01-09 (×2): qty 2

## 2020-01-09 MED ORDER — HYDROMORPHONE HCL 1 MG/ML IJ SOLN
0.5000 mg | Freq: Four times a day (QID) | INTRAMUSCULAR | Status: DC | PRN
  Administered 2020-01-09 – 2020-01-10 (×3): 0.5 mg via INTRAVENOUS
  Filled 2020-01-09 (×3): qty 0.5

## 2020-01-09 MED ORDER — ALUM & MAG HYDROXIDE-SIMETH 200-200-20 MG/5ML PO SUSP
30.0000 mL | Freq: Four times a day (QID) | ORAL | Status: AC
  Administered 2020-01-09 – 2020-01-10 (×2): 30 mL via ORAL
  Filled 2020-01-09 (×2): qty 30

## 2020-01-09 MED ORDER — POTASSIUM CHLORIDE CRYS ER 20 MEQ PO TBCR
40.0000 meq | EXTENDED_RELEASE_TABLET | Freq: Once | ORAL | Status: AC
  Administered 2020-01-09: 40 meq via ORAL
  Filled 2020-01-09: qty 2

## 2020-01-09 MED ORDER — PANTOPRAZOLE SODIUM 40 MG PO TBEC
40.0000 mg | DELAYED_RELEASE_TABLET | Freq: Every day | ORAL | Status: DC
  Administered 2020-01-09 – 2020-01-10 (×2): 40 mg via ORAL
  Filled 2020-01-09 (×2): qty 1

## 2020-01-09 NOTE — Progress Notes (Signed)
PROGRESS NOTE  BURDETT PINZON KYH:[id number] DOB: 06-14-1981 DOA: 01/07/2020 PCP: Grayce Sessions, NP  HPI/Recap of past 24 hours: HPI from Dr Peterson Ao is a 39 y.o. 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 Patient has been having significant abdominal pain nausea vomiting for the last 4 days. With persistent nausea vomiting his p.o. intake has decreased and patient came to the ER where he appears to be significantly dehydrated with acute kidney injury. Patient being admitted with acute on chronic pancreatitis with acute kidney injury. In the ED, BUN 43 creatinine 1.98 and calcium 10.5, lipase is 86 AST 41 and ALT 54 total protein 9.2.  CT abdomen pelvis from yesterday showed persistent occlusive left portal vein thrombosis with interval enlargement of numerous periportal venous collaterals.    Today, patient reports feeling slightly better, was able to tolerate clear liquid diet, still with epigastric pain, feeling bloated, has not had any BM.   Assessment/Plan: Principal Problem:   ARF (acute renal failure) (HCC) Active Problems:   Abdominal pain, epigastric   Portal vein thrombosis   Chronic alcoholic pancreatitis (HCC)   Intractable nausea/vomiting/abdominal pain Likely 2/2 portal vein thrombosis versus chronic pancreatitis CT abdomen/pelvis showed persistent occlusive left portal vein thrombosis, with interval enlargement of numerous periportal venous collaterals, involving suspected near occlusive SMV thrombosis with high-grade SMV stenosis, possible antral gastritis Continue supportive care, antiemetics  AKI Improved, baseline creatinine normal Likely 2/2 above Continue IV fluids Daily BMP  Hypokalemia Replace as needed  Portal vein and SMV thrombosis Was unable to take  Eliquis due to nausea/vomiting Continue therapeutic Lovenox [place] twice daily         Malnutrition Type:      Malnutrition Characteristics:      Nutrition Interventions:       Estimated body mass index is 22.83 kg/m as calculated from the following:   Height as of this encounter: 5\' 9"  (1.753 m).   Weight as of this encounter: 70.1 kg.     Code Status: Full  Family Communication: None at bedside  Disposition Plan: Likely Home once significant improvement   Consultants:  None  Procedures:  None  Antimicrobials:  None  DVT prophylaxis: Lovenox   Objective: Vitals:   01/08/20 1733 01/08/20 2053 01/09/20 0613 01/09/20 1234  BP: (!) 119/91 136/79 119/79 (!) 126/91  Pulse: (!) 58 (!) 53 (!) 55 (!) 59  Resp: 16 18 16 16   Temp: 97.9 F (36.6 C) 98 F (36.7 C) 98.5 F (36.9 C) 98.4 F (36.9 C)  TempSrc: Oral Oral Oral Oral  SpO2: 100% 100% 96% 100%  Weight:      Height:        Intake/Output Summary (Last 24 hours) at 01/09/2020 1705 Last data filed at 01/09/2020 1417 Gross per 24 hour  Intake 1703.08 ml  Output 1300 ml  Net 403.08 ml   Filed Weights   01/07/20 1437  Weight: 70.1 kg    Exam:  General: NAD   Cardiovascular: S1, S2 present  Respiratory: CTAB  Abdomen: Soft, +tender, nondistended, bowel sounds present  Musculoskeletal: No bilateral pedal edema noted  Skin: Normal  Psychiatry: Normal mood   Data Reviewed: CBC: Recent Labs  Lab 01/07/20 1440 01/08/20 0400 01/09/20 0526  WBC 12.1* 10.4 7.7  NEUTROABS  --   --  4.4  HGB 19.3* 15.8  14.5  HCT 55.9* 45.6 43.5  MCV 91.0 91.2 93.3  PLT 381 262 176   Basic Metabolic Panel: Recent Labs  Lab 01/07/20 1440 01/08/20 0400 01/09/20 0526  NA 137 135 133*  K 3.8 2.7* 3.2*  CL 79* 89* 95*  CO2 33* 33* 27  GLUCOSE 178* 141* 84  BUN 43* 50* 26*  CREATININE 1.96*  1.98* 1.57* 0.91  CALCIUM 10.5* 8.4* 8.6*   GFR: Estimated Creatinine Clearance: 108.1 mL/min (by  C-G formula based on SCr of 0.91 mg/dL). Liver Function Tests: Recent Labs  Lab 01/07/20 1440 01/08/20 0400  AST 41 31  ALT 54* 42  ALKPHOS 152* 107  BILITOT 2.1* 1.4*  PROT 9.2* 7.0  ALBUMIN 5.3* 4.1   Recent Labs  Lab 01/07/20 1440  LIPASE 86*   No results for input(s): AMMONIA in the last 168 hours. Coagulation Profile: No results for input(s): INR, PROTIME in the last 168 hours. Cardiac Enzymes: No results for input(s): CKTOTAL, CKMB, CKMBINDEX, TROPONINI in the last 168 hours. BNP (last 3 results) No results for input(s): PROBNP in the last 8760 hours. HbA1C: No results for input(s): HGBA1C in the last 72 hours. CBG: No results for input(s): GLUCAP in the last 168 hours. Lipid Profile: No results for input(s): CHOL, HDL, LDLCALC, TRIG, CHOLHDL, LDLDIRECT in the last 72 hours. Thyroid Function Tests: No results for input(s): TSH, T4TOTAL, FREET4, T3FREE, THYROIDAB in the last 72 hours. Anemia Panel: No results for input(s): VITAMINB12, FOLATE, FERRITIN, TIBC, IRON, RETICCTPCT in the last 72 hours. Urine analysis:    Component Value Date/Time   COLORURINE AMBER (A) 01/08/2020 0040   APPEARANCEUR CLOUDY (A) 01/08/2020 0040   LABSPEC 1.023 01/08/2020 0040   PHURINE 5.0 01/08/2020 0040   GLUCOSEU NEGATIVE 01/08/2020 0040   HGBUR NEGATIVE 01/08/2020 0040   BILIRUBINUR NEGATIVE 01/08/2020 0040   KETONESUR 5 (A) 01/08/2020 0040   PROTEINUR 30 (A) 01/08/2020 0040   UROBILINOGEN 1.0 01/13/2008 2059   NITRITE NEGATIVE 01/08/2020 0040   LEUKOCYTESUR NEGATIVE 01/08/2020 0040   Sepsis Labs: @LABRCNTIP (procalcitonin:4,lacticidven:4)  ) Recent Results (from the past 240 hour(s))  SARS CORONAVIRUS 2 (TAT 6-24 HRS) Nasopharyngeal Nasopharyngeal Swab     Status: None   Collection Time: 01/07/20  8:44 PM   Specimen: Nasopharyngeal Swab  Result Value Ref Range Status   SARS Coronavirus 2 NEGATIVE NEGATIVE Final    Comment: (NOTE) SARS-CoV-2 target nucleic acids are NOT  DETECTED. The SARS-CoV-2 RNA is generally detectable in upper and lower respiratory specimens during the acute phase of infection. Negative results do not preclude SARS-CoV-2 infection, do not rule out co-infections with other pathogens, and should not be used as the sole basis for treatment or other patient management decisions. Negative results must be combined with clinical observations, patient history, and epidemiological information. The expected result is Negative. Fact Sheet for Patients: 01/09/20 Fact Sheet for Healthcare Providers: HairSlick.no This test is not yet approved or cleared by the [place]ir[place]ir.[place] FDA and  has been authorized for detection and/or diagnosis of SARS-CoV-2 by FDA under an Emergency Use Authorization (EUA). This EUA will remain  in effect (meaning this test can be used) for the duration of the COVID-19 declaration under Section 56 4(b)(1) of the Act, 21 U.S.C. section 360bbb-3(b)(1), unless the authorization is terminated or revoked sooner. Performed at [facility], [address] [place], [place]       Studies: DG Abd Portable 1V  Result Date: 01/09/2020 CLINICAL DATA:  Abdominal pain  with nausea and vomiting EXAM: PORTABLE ABDOMEN - 1 VIEW COMPARISON:  01/06/2020 FINDINGS: Scattered large and small bowel gas is noted. Pancreatic stent is again seen and stable. No free air is noted. No abnormal mass or abnormal calcifications are seen. IMPRESSION: No acute abnormality noted. Electronically Signed   By: Inez Catalina M.D.   On: 01/09/2020 09:10    Scheduled Meds: . alum & mag hydroxide-simeth  30 mL Oral Q6H  . enoxaparin (LOVENOX) injection  1 mg/kg Subcutaneous Q12H  . folic acid  1 mg Intravenous Daily  . pantoprazole  40 mg Oral Daily  . thiamine injection  100 mg Intravenous Daily    Continuous Infusions: . sodium chloride 100 mL/hr at 01/09/20 1552     LOS: 2  days     Alma Friendly, MD [facility]  If 7PM-7AM, please contact night-coverage [email or web address] 01/09/2020, 5:05 PM

## 2020-01-10 ENCOUNTER — Encounter (HOSPITAL_COMMUNITY): Payer: Self-pay | Admitting: Internal Medicine

## 2020-01-10 DIAGNOSIS — K859 Acute pancreatitis without necrosis or infection, unspecified: Secondary | ICD-10-CM

## 2020-01-10 DIAGNOSIS — K861 Other chronic pancreatitis: Secondary | ICD-10-CM

## 2020-01-10 LAB — BASIC METABOLIC PANEL
Anion gap: 8 (ref 5–15)
BUN: 13 mg/dL (ref 6–20)
CO2: 24 mmol/L (ref 22–32)
Calcium: 8.6 mg/dL — ABNORMAL LOW (ref 8.9–10.3)
Chloride: 104 mmol/L (ref 98–111)
Creatinine, Ser: 0.66 mg/dL (ref 0.61–1.24)
GFR calc Af Amer: >60 mL/min (ref >60)
GFR calc non Af Amer: >60 mL/min (ref >60)
Glucose, Bld: 78 mg/dL (ref 70–99)
Potassium: 3.6 mmol/L (ref 3.5–5.1)
Sodium: 136 mmol/L (ref 135–145)

## 2020-01-10 LAB — CBC WITH DIFFERENTIAL/PLATELET
Abs Immature Granulocytes: 0.02 10*3/uL (ref 0.00–0.07)
Basophils Absolute: 0.1 10*3/uL (ref 0.0–0.1)
Basophils Relative: 2 %
Eosinophils Absolute: 0.2 10*3/uL (ref 0.0–0.5)
Eosinophils Relative: 3 %
HCT: 40.8 % (ref 39.0–52.0)
Hemoglobin: 13.5 g/dL (ref 13.0–17.0)
Immature Granulocytes: 0 %
Lymphocytes Relative: 38 %
Lymphs Abs: 2 10*3/uL (ref 0.7–4.0)
MCH: 31 pg (ref 26.0–34.0)
MCHC: 33.1 g/dL (ref 30.0–36.0)
MCV: 93.8 fL (ref 80.0–100.0)
Monocytes Absolute: 0.5 10*3/uL (ref 0.1–1.0)
Monocytes Relative: 10 %
Neutro Abs: 2.5 10*3/uL (ref 1.7–7.7)
Neutrophils Relative %: 47 %
Platelets: 175 10*3/uL (ref 150–400)
RBC: 4.35 MIL/uL (ref 4.22–5.81)
RDW: 13.1 % (ref 11.5–15.5)
WBC: 5.3 10*3/uL (ref 4.0–10.5)
nRBC: 0 % (ref 0.0–0.2)

## 2020-01-10 MED ORDER — APIXABAN 5 MG PO TABS: 5.0000 mg | ORAL_TABLET | Freq: Two times a day (BID) | ORAL | Status: DC

## 2020-01-10 MED ORDER — OXYCODONE HCL 10 MG PO TABS: 10.0000 mg | ORAL_TABLET | Freq: Four times a day (QID) | ORAL | 0 refills | Status: AC | PRN

## 2020-01-10 MED ORDER — ONDANSETRON 4 MG PO TBDP: 4.0000 mg | ORAL_TABLET | Freq: Three times a day (TID) | ORAL | 0 refills | Status: DC | PRN

## 2020-01-10 MED ORDER — THIAMINE HCL 100 MG PO TABS: 100.0000 mg | ORAL_TABLET | Freq: Every day | ORAL | Status: DC

## 2020-01-10 MED ORDER — FOLIC ACID 1 MG PO TABS: 1.0000 mg | ORAL_TABLET | Freq: Every day | ORAL | Status: DC

## 2020-01-10 NOTE — Discharge Summary (Signed)
Discharge Summary  Paul Allen YIR:[id number] DOB: 05/11/1981  PCP: Kerin Perna, NP  Admit date: 01/07/2020 Discharge date: 01/10/2020  Time spent: 40 mins  Recommendations for Outpatient Follow-up:  1. Follow-up with PCP in 1 week 2. Follow-up with GI as scheduled  Discharge Diagnoses:  Active Hospital Problems   Diagnosis Date Noted   ARF (acute renal failure) ([place]) 01/07/2020   Chronic alcoholic pancreatitis ([place]) 01/07/2020   Portal vein thrombosis    Abdominal pain, epigastric     Resolved Hospital Problems  No resolved problems to display.    Discharge Condition: Stable  Diet recommendation: Low-fat diet, small but frequent meals  Vitals:   01/09/20 2044 01/10/20 0542  BP: 129/82 130/83  Pulse: (!) 51 (!) 47  Resp: 16 16  Temp: 99.3 F (37.4 C) 98.7 F (37.1 C)  SpO2: 98% 97%    History of present illness:  Paul Allen a 39 y.o.malewith medical history significant ofrecurrent pancreatitis initially alcoholic who has been sober for couple of months but also has known portal vein thrombosis and SMV thrombosis secondary to recurrent pancreatitis. Patient has been having significant abdominal pain nausea vomiting for the last 4 days. With persistent nausea vomiting his p.o. intake has decreased and patient came to the ER where he appears to be significantly dehydrated with acute kidney injury. Patient being admitted with acute on chronic pancreatitis with acute kidney injury. In the ED, BUN 43 creatinine 1.98 and calcium 10.5, lipase is 86 AST 41 and ALT 54 total protein 9.2. CT abdomen pelvis from yesterday showed persistent occlusive left portal vein thrombosis with interval enlargement of numerous periportal venous collaterals.    Today, patient reports improvement, denies any worsening nausea/vomiting or worsening abdominal pain.  Was able to tolerate his diet, had a BM this morning.  Patient insisted on being seen by GI while in-house  as he had several questions.  GI consulted, okay to discharge from their end and will call patient for follow-up appointment.    Hospital Course:  Principal Problem:   ARF (acute renal failure) (HCC) Active Problems:   Abdominal pain, epigastric   Portal vein thrombosis   Chronic alcoholic pancreatitis (HCC)   Intractable nausea/vomiting/abdominal pain Likely 2/2 ?? Mild subacute pancreatitis Vs ??gastritis Vs ??portal vein thrombosis  CT abdomen/pelvis showed persistent occlusive left portal vein thrombosis, with interval enlargement of numerous periportal venous collaterals, involving suspected near occlusive SMV thrombosis with high-grade SMV stenosis, possible antral gastritis GI consulted, no further recommendations, okay to discharge, follow-up as an outpatient Outpatient GI follow-up PCP follow-up  AKI Improved, baseline creatinine normal Likely 2/2 above PCP follow-up  Hypokalemia Resolved  Portal vein and SMV thrombosis Continue home Eliquis         Malnutrition Type:      Malnutrition Characteristics:      Nutrition Interventions:      Estimated body mass index is 22.83 kg/m as calculated from the following:   Height as of this encounter: 5\' 9"  (1.753 m).   Weight as of this encounter: 70.1 kg.    Procedures:  None  Consultations:  GI  Discharge Exam: BP 130/83 (BP Location: Left Arm)    Pulse (!) 47    Temp 98.7 F (37.1 C) (Oral)    Resp 16    Ht 5\' 9"  (1.753 m)    Wt 70.1 kg    SpO2 97%    BMI 22.83 kg/m   General: NAD Cardiovascular: S1, S2 present Respiratory: CTA B  Abdomen: Soft, nontender, nondistended, bowel sounds present  Discharge Instructions You were cared for by a hospitalist during your hospital stay. If you have any questions about your discharge medications or the care you received while you were in the hospital after you are discharged, you can call the unit and asked to speak with the hospitalist on call if the  hospitalist that took care of you is not available. Once you are discharged, your primary care physician will handle any further medical issues. Please note that NO REFILLS for any discharge medications will be authorized once you are discharged, as it is imperative that you return to your primary care physician (or establish a relationship with a primary care physician if you do not have one) for your aftercare needs so that they can reassess your need for medications and monitor your lab values.  Discharge Instructions    Diet - low sodium heart healthy   Complete by: As directed    Increase activity slowly   Complete by: As directed      Allergies as of 01/10/2020      Reactions   Penicillins Rash   Has patient had a PCN reaction causing immediate rash, facial/tongue/throat swelling, SOB or lightheadedness with hypotension: yes Has patient had a PCN reaction causing severe rash involving mucus membranes or skin necrosis: Yes Has patient had a PCN reaction that required hospitalization: No Has patient had a PCN reaction occurring within the last 10 years: No If all of the above answers are "NO", then may proceed with Cephalosporin use.      Medication List    STOP taking these medications   traMADol 50 MG tablet Commonly known as: ULTRAM     TAKE these medications   apixaban 5 MG Tabs tablet Commonly known as: ELIQUIS Take 10 mg (2 tabs) twice daily for 1 week, then take 5 mg (1 tab) twice daily for 3 months then stop What changed:   how much to take  how to take this  when to take this  additional instructions   folic acid 1 MG tablet Commonly known as: FOLVITE Take 1 tablet (1 mg total) by mouth daily.   ondansetron 4 MG disintegrating tablet Commonly known as: ZOFRAN-ODT Take 4 mg by mouth every 8 (eight) hours as needed for nausea/vomiting.   Oxycodone HCl 10 MG Tabs Take 10 mg by mouth every 6 (six) hours as needed (pain).   pantoprazole 40 MG tablet Commonly  known as: PROTONIX Take 1 tablet (40 mg total) by mouth daily at 6 (six) AM.   thiamine 100 MG tablet Take 1 tablet (100 mg total) by mouth daily.      Allergies  Allergen Reactions   Penicillins Rash    Has patient had a PCN reaction causing immediate rash, facial/tongue/throat swelling, SOB or lightheadedness with hypotension: yes Has patient had a PCN reaction causing severe rash involving mucus membranes or skin necrosis: Yes Has patient had a PCN reaction that required hospitalization: No Has patient had a PCN reaction occurring within the last 10 years: No If all of the above answers are "NO", then may proceed with Cephalosporin use.    Follow-up Information    Grayce Sessions, NP. Schedule an appointment as soon as possible for a visit in 1 week(s).   Specialty: Internal Medicine Contact information: [address] [place] [address] [phone]            The results of significant diagnostics from this hospitalization (including  imaging, microbiology, ancillary and laboratory) are listed below for reference.    Significant Diagnostic Studies: CT Abdomen Pelvis W Contrast  Result Date: 01/06/2020 CLINICAL DATA:  Inpatient. Follow-up severe acute pancreatitis status post ERCP 12/06/2019 and 12/11/2019 with stent placement. Nausea and vomiting for 3 days. Abdominal pain for several months. EXAM: CT ABDOMEN AND PELVIS WITH CONTRAST TECHNIQUE: Multidetector CT imaging of the abdomen and pelvis was performed using the standard protocol following bolus administration of intravenous contrast. CONTRAST:  100mL OMNIPAQUE IOHEXOL 300 MG/ML  SOLN COMPARISON:  12/08/2019 CT abdomen/pelvis. FINDINGS: Motion degraded scan limits assessment. Lower chest: No significant pulmonary nodules or acute consolidative airspace disease. Hepatobiliary: There is heterogeneous enhancement throughout the liver with relative central hypoenhancement, with mild interval generalized  enlargement of the hypoenhancing portion of the liver. No discrete liver masses. No radiopaque cholelithiasis. No definite gallbladder wall thickening. Gallbladder is mildly distended. Mild diffuse intrahepatic biliary ductal dilatation is similar. Normal caliber common bile duct (3 mm diameter). Pancreas: Pancreatic duct stent is in place with the distal portion in descending duodenal lumen. No pancreatic duct dilation. No discrete pancreatic mass. Pancreatic parenchymal enhancement is preserved. Pancreatic and peripancreatic edema appears decreased. Spleen: Normal size. No mass. Adrenals/Urinary Tract: Stable mildly irregular left adrenal contour without discrete adrenal nodules. No contour deforming renal masses. No hydronephrosis. Normal nondistended bladder. Stomach/Bowel: Increased smooth circumferential apparent wall thickening in the gastric antrum. Normal caliber small bowel with no small bowel wall thickening. Appendectomy. Normal large bowel with no diverticulosis, large bowel wall thickening or pericolonic fat stranding. Vascular/Lymphatic: Atherosclerotic nonaneurysmal abdominal aorta. There is high-grade narrowing of the SMV adjacent to the portal splenic venous confluence with heterogeneous poor SMV enhancement, similar to slightly improved, suspect evolving near occlusive SMV thrombosis. Improved enhancement of the main portal and right portal veins, with decreased nonocclusive main portal and right portal vein thrombosis. Persistent occlusive thrombosis of the left portal vein with interval enlargement of numerous periportal venous collaterals. Patent splenic, renal and hepatic veins. Mildly enlarged 1.2 cm left para-aortic lymph node is stable. No new pathologically enlarged abdominopelvic lymph nodes. Reproductive: Normal size prostate. Other: No pneumoperitoneum. Resolved ascites. Resolved lesser sac fluid collections along the proximal greater curvature of the stomach. No new or residual  measurable fluid collections. Musculoskeletal: No aggressive appearing focal osseous lesions. Mild thoracolumbar spondylosis. IMPRESSION: 1. Persistent occlusive left portal venous thrombosis with interval enlargement of numerous periportal venous collaterals. Decreased nonocclusive thrombosis of the main and right portal veins. Evolving suspected near occlusive SMV thrombosis with high-grade SMV stenosis near the portal splenic venous confluence. 2. Heterogeneous central liver enhancement and enlargement, suspected to be due to altered perfusion. No discrete liver masses. 3. Mild diffuse intrahepatic biliary ductal dilatation with normal caliber CBD, unchanged. 4. Otherwise improved findings of pancreatitis. Peripancreatic fluid collections and ascites have resolved. Well-positioned pancreatic duct stent. 5. Apparent increased smooth circumferential wall thickening in the gastric antrum, nonspecific, cannot exclude antral gastritis. No free air. 6.  Aortic Atherosclerosis (ICD10-I70.0). Electronically Signed   By: Delbert PhenixJason A Poff M.D.   On: 01/06/2020 16:52   DG ERCP With Sphincterotomy  Result Date: 12/11/2019 CLINICAL DATA:  Pancreatic duct leak. EXAM: ERCP TECHNIQUE: Multiple spot images obtained with the fluoroscopic device and submitted for interpretation post-procedure. FLUOROSCOPY TIME:  Fluoroscopy Time:  6 minutes and 47 seconds Number of Acquired Spot Images: 21 COMPARISON:  CT 12/08/2019 FINDINGS: Pancreatic duct was cannulated and opacified with contrast. No overt contrast extravasation from the pancreatic duct. Mild irregularity of the  pancreatic duct along the head / neck region. Wire was advanced into the pancreatic duct and a pancreatic stent was placed. IMPRESSION: Placement of pancreatic stent. These images were submitted for radiologic interpretation only. Please see the procedural report for the amount of contrast and the fluoroscopy time utilized. Electronically Signed   By: Richarda Overlie M.D.    On: 12/11/2019 16:36   DG Abd Portable 1V  Result Date: 01/09/2020 CLINICAL DATA:  Abdominal pain with nausea and vomiting EXAM: PORTABLE ABDOMEN - 1 VIEW COMPARISON:  01/06/2020 FINDINGS: Scattered large and small bowel gas is noted. Pancreatic stent is again seen and stable. No free air is noted. No abnormal mass or abnormal calcifications are seen. IMPRESSION: No acute abnormality noted. Electronically Signed   By: Alcide Clever M.D.   On: 01/09/2020 09:10    Microbiology: Recent Results (from the past 240 hour(s))  SARS CORONAVIRUS 2 (TAT 6-24 HRS) Nasopharyngeal Nasopharyngeal Swab     Status: None   Collection Time: 01/07/20  8:44 PM   Specimen: Nasopharyngeal Swab  Result Value Ref Range Status   SARS Coronavirus 2 NEGATIVE NEGATIVE Final    Comment: (NOTE) SARS-CoV-2 target nucleic acids are NOT DETECTED. The SARS-CoV-2 RNA is generally detectable in upper and lower respiratory specimens during the acute phase of infection. Negative results do not preclude SARS-CoV-2 infection, do not rule out co-infections with other pathogens, and should not be used as the sole basis for treatment or other patient management decisions. Negative results must be combined with clinical observations, patient history, and epidemiological information. The expected result is Negative. Fact Sheet for Patients: HairSlick.no Fact Sheet for Healthcare Providers: quierodirigir.com This test is not yet approved or cleared by the [place] FDA and  has been authorized for detection and/or diagnosis of SARS-CoV-2 by FDA under an Emergency Use Authorization (EUA). This EUA will remain  in effect (meaning this test can be used) for the duration of the COVID-19 declaration under Section 56 4(b)(1) of the Act, 21 U.S.C. section 360bbb-3(b)(1), unless the authorization is terminated or revoked sooner. Performed at [facility], [address] [place], [place] [address]      Labs: Basic Metabolic Panel: Recent Labs  Lab 01/07/20 1440 01/08/20 0400 01/09/20 0526 01/10/20 0556  NA 137 135 133* 136  K 3.8 2.7* 3.2* 3.6  CL 79* 89* 95* 104  CO2 33* 33* 27 24  GLUCOSE 178* 141* 84 78  BUN 43* 50* 26* 13  CREATININE 1.96*   1.98* 1.57* 0.91 0.66  CALCIUM 10.5* 8.4* 8.6* 8.6*   Liver Function Tests: Recent Labs  Lab 01/07/20 1440 01/08/20 0400  AST 41 31  ALT 54* 42  ALKPHOS 152* 107  BILITOT 2.1* 1.4*  PROT 9.2* 7.0  ALBUMIN 5.3* 4.1   Recent Labs  Lab 01/07/20 1440  LIPASE 86*   No results for input(s): AMMONIA in the last 168 hours. CBC: Recent Labs  Lab 01/07/20 1440 01/08/20 0400 01/09/20 0526 01/10/20 0556  WBC 12.1* 10.4 7.7 5.3  NEUTROABS  --   --  4.4 2.5  HGB 19.3* 15.8 14.5 13.5  HCT 55.9* 45.6 43.5 40.8  MCV 91.0 91.2 93.3 93.8  PLT 381 262 176 175   Cardiac Enzymes: No results for input(s): CKTOTAL, CKMB, CKMBINDEX, TROPONINI in the last 168 hours. BNP: BNP (last 3 results) No results for input(s): BNP in the last 8760 hours.  ProBNP (last 3 results) No results for input(s): PROBNP in the last 8760  hours.  CBG: No results for input(s): GLUCAP in the last 168 hours.     Signed:  Briant Cedar, MD [facility] 01/10/2020, 1:48 PM

## 2020-01-10 NOTE — Consult Note (Addendum)
Referring Provider:  Dr. Sharolyn Douglas, [facility] Primary Care Physician:  Grayce Sessions, NP Primary Gastroenterologist:  Dr. Myrtie Neither  Reason for Consultation:  Pancreatitis   HPI: Paul Allen is a 39 y.o. 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   He was seen in initial office consult on 11/28/2019 for recurrent alcohol-related pancreatitis with visits to the emergency department for same.  Known pancreatic pseudocysts. His pain worsened over the next several days and he was admitted to the hospital on 12/02/2019. Imaging suggested pancreatic duct disruption as well as SMV thrombosis.  He was put on octreotide, antibiotics, and underwent ERCP attempt on 12/09/2019 by Dr. Christella Hartigan.  The duodenum was so inflamed the papilla could not be located. Repeat ERCP by Dr. Meridee Score on 12/11/2019 was able to cannulate the ventral pancreatic duct, sphincterotomy was performed and 5 French, 7 cm plastic stent placed.  Was placed on heparin for SMV/PV thrombosis then transitioned to Eliquis.  Was seen in follow-up by Dr. Myrtie Neither on 1/14 at which time he was doing well.  Then he presented to [facility] ED on 1/19 with complaints of nausea, vomiting, abdominal pain.  CT scan of the abdomen and pelvis with contrast showed the following:  IMPRESSION: 1. Persistent occlusive left portal venous thrombosis with interval enlargement of numerous periportal venous collaterals. Decreased nonocclusive thrombosis of the main and right portal veins. Evolving suspected near occlusive SMV thrombosis with high-grade SMV stenosis near the portal splenic venous confluence. 2. Heterogeneous central liver enhancement and enlargement, suspected to be due to altered perfusion. No discrete liver masses. 3. Mild diffuse intrahepatic biliary ductal dilatation with normal caliber CBD, unchanged. 4. Otherwise improved findings of pancreatitis. Peripancreatic fluid collections and ascites have resolved. Well-positioned  pancreatic duct stent. 5. Apparent increased smooth circumferential wall thickening in the gastric antrum, nonspecific, cannot exclude antral gastritis. No free air. 6.  Aortic Atherosclerosis (ICD10-I70.0).  Initially had some hyponatremia but that has been corrected.  Lipase was only 86, LFT's ok, blood counts good.  Initial lactic acid was elevated at 2.8, but normalized to 1.8 with IV hydration.  Feels good now and is ready for discharge.  GI consult was placed at the patient's request as he just wanted to discuss things and had several questions.   Past Medical History:  Diagnosis Date  . Abdominal pain   . Mesenteric vein thrombosis (HCC)    Dec 2020 in setting of pancreatitis  . Nausea & vomiting   . Pancreatitis     Past Surgical History:  Procedure Laterality Date  . APPENDECTOMY    . ERCP N/A 12/06/2019   Procedure: ENDOSCOPIC RETROGRADE CHOLANGIOPANCREATOGRAPHY (ERCP);  Surgeon: Rachael Fee, MD;  Location: [facility];  Service: Endoscopy;  Laterality: N/A;  Aborted Procedure  . ERCP N/A 12/11/2019   Procedure: ENDOSCOPIC RETROGRADE CHOLANGIOPANCREATOGRAPHY (ERCP);  Surgeon: Lemar Lofty., MD;  Location: [facility];  Service: Gastroenterology;  Laterality: N/A;  . PANCREATIC STENT PLACEMENT  12/11/2019   Procedure: PANCREATIC STENT PLACEMENT;  Surgeon: Meridee Score Netty Starring., MD;  Location: [facility];  Service: Gastroenterology;;  . Dennison Mascot  12/11/2019   Procedure: Dennison Mascot;  Surgeon: Meridee Score Netty Starring., MD;  Location: [facility];  Service: Gastroenterology;;    Prior to Admission medications   Medication Sig Start Date End Date Taking? Authorizing Provider  apixaban (ELIQUIS) 5 MG TABS tablet Take 10 mg (2 tabs) twice daily for 1 week, then take 5 mg (1 tab) twice daily for 3 months then stop Patient taking  differently: Take 5 mg by mouth 2 (two) times daily.  12/15/19  Yes Danford, Earl Lites, MD  folic acid (FOLVITE) 1 MG  tablet Take 1 tablet (1 mg total) by mouth daily. 12/15/19  Yes Danford, Earl Lites, MD  ondansetron (ZOFRAN-ODT) 4 MG disintegrating tablet Take 4 mg by mouth every 8 (eight) hours as needed for nausea/vomiting. 12/17/19  Yes [provider]  Oxycodone HCl 10 MG TABS Take 10 mg by mouth every 6 (six) hours as needed (pain).    Yes [provider]  pantoprazole (PROTONIX) 40 MG tablet Take 1 tablet (40 mg total) by mouth daily at 6 (six) AM. 12/16/19  Yes Danford, Earl Lites, MD  thiamine 100 MG tablet Take 1 tablet (100 mg total) by mouth daily. 12/15/19  Yes Danford, Earl Lites, MD    Current Facility-Administered Medications  Medication Dose Route Frequency Provider Last Rate Last Admin  . 0.9 %  sodium chloride infusion   Intravenous Continuous Briant Cedar, MD 100 mL/hr at 01/10/20 1055 Rate Verify at 01/10/20 1055  . apixaban (ELIQUIS) tablet 5 mg  5 mg Oral BID Briant Cedar, MD      . Melene Muller ON 01/11/2020] folic acid (FOLVITE) tablet 1 mg  1 mg Oral Daily Briant Cedar, MD      . HYDROcodone-acetaminophen (NORCO/VICODIN) 5-325 MG per tablet 1-2 tablet  1-2 tablet Oral Q4H PRN Briant Cedar, MD   2 tablet at 01/10/20 0854  . ondansetron (ZOFRAN) tablet 4 mg  4 mg Oral Q6H PRN Rometta Emery, MD       Or  . ondansetron (ZOFRAN) injection 4 mg  4 mg Intravenous Q6H PRN Rometta Emery, MD   4 mg at 01/10/20 0232  . pantoprazole (PROTONIX) EC tablet 40 mg  40 mg Oral Daily Briant Cedar, MD   40 mg at 01/10/20 0854  . [START ON 01/11/2020] thiamine tablet 100 mg  100 mg Oral Daily Briant Cedar, MD        Allergies as of 01/07/2020 - Review Complete 01/07/2020  Allergen Reaction Noted  . Penicillins Rash 05/13/2018    Family History  Problem Relation Age of Onset  . Cholecystitis Mother   . Arthritis Mother   . Stomach cancer Father     Social History   Social History Narrative   Single no kids    Uninsured   Smoker - tobacco and marijuana   Alcohol - hx - stopped last time in 11/2019 w/ pancreatitis   Social History   Tobacco Use  . Smoking status: Current Every Day Smoker    Packs/day: 1.50    Types: Cigarettes  . Smokeless tobacco: Never Used  Substance Use Topics  . Alcohol use: Not Currently    Comment: hasn't drank in 4 weeks, but before would drink about 5 malt beers a day  . Drug use: Yes    Frequency: 7.0 times per week    Types: Marijuana     Review of Systems: ROS is O/W negative except as mentioned in HPI.  Physical Exam: Vital signs in last 24 hours: Temp:  [98.7 F (37.1 C)-99.3 F (37.4 C)] 98.7 F (37.1 C) (01/22 0542) Pulse Rate:  [47-51] 47 (01/22 0542) Resp:  [16] 16 (01/22 0542) BP: (129-130)/(82-83) 130/83 (01/22 0542) SpO2:  [97 %-98 %] 97 % (01/22 0542) Last BM Date: 01/09/20 General:  Alert, Well-developed, well-nourished, pleasant and cooperative in NAD Head:  Normocephalic and atraumatic. Eyes:  Sclera clear, no icterus.  Conjunctiva pink. Ears:  Normal auditory acuity. Mouth:  No deformity or lesions.   Lungs:  Clear throughout to auscultation.  No wheezes, crackles, or rhonchi.  Heart:  Bradycardic; no murmurs, clicks, rubs, or gallops. Abdomen:  Soft, non-distended.  BS present. Non-tender. Msk:  Symmetrical without gross deformities. Pulses:  Normal pulses noted. Extremities:  Without clubbing or edema. Neurologic:  Alert and oriented x 4;  grossly normal neurologically. Skin:  Intact without significant lesions or rashes. Psych:  Alert and cooperative. Normal mood and affect.  Intake/Output from previous day: 01/21 0701 - 01/22 0700 In: 2560 [P.O.:2560] Out: 1850 [Urine:1850] Intake/Output this shift: Total I/O In: 3981.7 [I.V.:3981.7] Out: 50 [Urine:50]  Lab Results: Recent Labs    01/08/20 0400 01/09/20 0526 01/10/20 0556  WBC 10.4 7.7 5.3  HGB 15.8 14.5 13.5  HCT 45.6 43.5 40.8  PLT 262 176 175    BMET Recent Labs    01/08/20 0400 01/09/20 0526 01/10/20 0556  NA 135 133* 136  K 2.7* 3.2* 3.6  CL 89* 95* 104  CO2 33* 27 24  GLUCOSE 141* 84 78  BUN 50* 26* 13  CREATININE 1.57* 0.91 0.66  CALCIUM 8.4* 8.6* 8.6*   LFT Recent Labs    01/08/20 0400  PROT 7.0  ALBUMIN 4.1  AST 31  ALT 42  ALKPHOS 107  BILITOT 1.4*   Studies/Results: DG Abd Portable 1V  Result Date: 01/09/2020 CLINICAL DATA:  Abdominal pain with nausea and vomiting EXAM: PORTABLE ABDOMEN - 1 VIEW COMPARISON:  01/06/2020 FINDINGS: Scattered large and small bowel gas is noted. Pancreatic stent is again seen and stable. No free air is noted. No abnormal mass or abnormal calcifications are seen. IMPRESSION: No acute abnormality noted. Electronically Signed   By: Inez Catalina M.D.   On: 01/09/2020 09:10   IMPRESSION:  *Recurrent pancreatitis.  Necrotizing pancreatitis.  Multiple abdominal fluid collections.  Pancreatic duct disruption.  Abdominal ascites.  12/11/2019 ERCP with stenting to PD.  Patient admitted for nausea, vomiting, and abdominal pain.  Patient much improved.  Imaging actually improved as well.    *SMV/PV thrombosis.  On Eliquis.  PLAN: *Patient is doing well clinically.  CT scan looks improved as well and labs look good.  Discussed with patient and questions answered.  Will plan for Dr. Loletha Carrow to determine follow-up procedures, OV, etc.  Ok for hospital discharge today.  Advised to follow a low-fat diet with nutritional supplements.  Alonza Bogus, PA-C  01/10/2020, 2:12 PM     [facility] Attending   I have taken an interval history, reviewed the chart and examined the patient. I agree with the Advanced Practitioner's note, impression and recommendations.    Imaging overall improved He is understandably anxious about his situation. We explained tincture of time, abstinence from EtOH and supportive care w/ nutrition is what it takes.  Gatha Mayer, MD, [facility]  Gastroenterology 01/10/2020 2:14 PM  [phone]

## 2020-01-13 ENCOUNTER — Telehealth: Payer: Self-pay

## 2020-01-13 MED FILL — !ELIQUIS 5MG TABLET: 5 | 23 days supply | Qty: 60 | Fill #0

## 2020-01-13 NOTE — Telephone Encounter (Signed)
-----   Message from Loretha Stapler, RN sent at 01/10/2020  4:40 PM EST -----  ----- Message ----- From: Lemar Lofty., MD Sent: 01/10/2020   4:38 PM EST To: Iva Boop, MD, Loretha Stapler, RN, #  Sherilyn Cooter, Thanks for the update.I had a chance to review the CT.  Looks much better.I will plan to repeat his ERCP with removal of the stent and repeat injection to ensure we do not see any evidence of a leak and then I will plan to remove it and not replace stent if nothing is found.We will arrange for this in approximately 1 month.Reedy Biernat please work on scheduling this patient as a category 2 ERCP.Thanks.GM ----- Message ----- From: Sherrilyn Rist, MD Sent: 01/09/2020   7:56 AM EST To: Lemar Lofty., MD  Gabe,  This is the patient you had seen for complex pancreatitis and PD stent placement in the hospital last month.  He was hospitalized again several days after I recently saw him in clinic.  He has exacerbation of abdominal pain nausea vomiting acute kidney injury and persistent SMV thrombosis with collaterals.  His CT is available for your review.  It looks like he is getting volume resuscitation, Lovenox and supportive care.  They do not appear to have consulted [place].  Perhaps you could review his CT scan and decide, based on how he does with his hospital stay, when you want to repeat ERCP and deal with his stent.  - HD

## 2020-01-13 NOTE — Telephone Encounter (Signed)
Transition Care Management Follow-up Telephone Call  Date of discharge and from where: 01/10/2020, [facility]   How have you been since you were released from the hospital? He said that he is not feeling good. He explained that he is not able to keep food down and continues to experience episodes of vomiting. He said that he has not been able to tolerate any solids or liquids.  He said that he vomits after everything. He stated that he was discharged and was told that he is okay but he does not feel okay.  He said that he is loosing about 10 lbs /month and is now weighing 130 lbs. He said that he is trying to avoid going back to the hospital. He also said that twice today he noticed a small amount of blood on his toilet paper after moving his bowels. He said that the stool was not dark and the toilet water was not red.  Reviewed with him when to return to the ED and informed him that his provider would be notified of his concerns.   Any questions or concerns? Concerns noted above  Items Reviewed:  Did the pt receive and understand the discharge instructions provided? He said that he has the instructions and does not have any questions.   Medications obtained and verified? He said that he has all medications and does not have any questions and is taking medications as ordered. He noted that he is taking apixaban 5mg  twice daily at this time.   Any new allergies since your discharge?none reported   Dietary orders reviewed? He said that he has tried to introduce more foods to his diet but has not been successful.  He continues to have ongoing vomiting and abdominal pain.   Do you have support at home? Lives with his mother  Other (ie: DME, Home Health, etc) no home health ordered.   Functional Questionnaire: (I = Independent and D = Dependent) ADL's: independent   Follow up appointments reviewed:    PCP Hospital f/u appt confirmed? Scheduled appt at [facility] 01/17/2020 @ 1050.     Specialist Hospital f/u appt confirmed? None scheduled at this time   Are transportation arrangements needed? No  If their condition worsens, is the pt aware to call  their PCP or go to the ED? Yes  Was the patient provided with contact information for the PCP's office or ED? He has the  phone number for the clinic  Was the pt encouraged to call back with questions or concerns?yes

## 2020-01-15 ENCOUNTER — Other Ambulatory Visit: Payer: Self-pay

## 2020-01-15 DIAGNOSIS — Z4689 Encounter for fitting and adjustment of other specified devices: Secondary | ICD-10-CM

## 2020-01-15 DIAGNOSIS — K859 Acute pancreatitis without necrosis or infection, unspecified: Secondary | ICD-10-CM

## 2020-01-15 NOTE — Telephone Encounter (Signed)
The pt is on Eliquis for thrombosis.  He has only been on it for 1 month.  Do you wan that held?  I have sent a letter to [name] to get approval but also wanted to check you as well.  The pt has been scheduled for 02/12/20 for ERCP and COVID testing on 2/20.  He has been advised and instructed.

## 2020-01-15 NOTE — Telephone Encounter (Signed)
Patty, Let's get the OK from the MD. If they are OK with coming off for 1-day, I am fine with that. I already have a sphincterotomy and stent in place, so hopefully it wouldn't be as high risk for bleeding. Let me know what they say. Thanks. GM

## 2020-01-17 ENCOUNTER — Telehealth: Payer: Self-pay

## 2020-01-17 ENCOUNTER — Other Ambulatory Visit: Payer: Self-pay

## 2020-01-17 ENCOUNTER — Encounter (INDEPENDENT_AMBULATORY_CARE_PROVIDER_SITE_OTHER): Payer: Self-pay | Admitting: Primary Care

## 2020-01-17 NOTE — Telephone Encounter (Signed)
-----   Message from Lemar Lofty., MD sent at 01/17/2020  4:08 PM EST ----- Odalys Win,I think the thought of waiting until March 27 is not unreasonable however it is not clear to me that Dr. Myrtie Neither is managing the anticoagulation for this patient.  Dr. Maryfrances Bunnell was the inpatient attending and so it is not clear to me that his primary care doctor is managing the anticoagulation either.   I would move forward with scheduling the patient for a semiurgent hematology clinic visit to discuss timing of potential anticoagulation duration as well as potential for timing of interruption.Tentatively plan for patient to have ERCP at the end of March after the 46-month interval, but in the interim he can be seen by hematology to help guide things as much as possible.Thanks.GM ----- Message ----- From: Loretha Stapler, RN Sent: 01/16/2020   8:40 AM EST To: Lemar Lofty., MD  Please respond  ----- Message ----- From: Alberteen Sam, MD Sent: 01/15/2020   5:18 PM EST To: Loretha Stapler, RN  Hi Jael Waldorf,  If this is an elective procedure, I would ask that you discuss with Dr. Myrtie Neither if it can be delayed until Mar 14, 2020, at which time the patient could safely discontinue anticoagulant after three months.  If this procedure cannot be delayed, but is endoscopic without biopsy planned that would risk major bleeding (Dr. Myrtie Neither can detemine this better than me), I would recommend continuing anticoagulation without interruption.  The patient had a venous thrombosis diagnosed in mid-Dec 2020, and is within 12 weeks of that diagnosis.  For that reason, I would favor not interrupting anticoagulation (despite that recent imaging suggests organization of the clot with collaterals, which may inject some uncertainty into the equation). If questions, I can be reached best by phone.  [phone]    Thanks,Chris Danford   ----- Message ----- From: Loretha Stapler, RN Sent: 01/15/2020  11:07 AM EST To:  Alberteen Sam, MD

## 2020-01-20 NOTE — Telephone Encounter (Signed)
[name] the pt confirmed that [name] is managing the anticoag medication.  The pt states [name] wants the pt to wait if reasonable for procedure.  [name] is not comfortable with the pt stopping anticoag at this time.  I will put in a reminder to contact pt in 3 months for repeat ERCP.

## 2020-01-20 NOTE — Telephone Encounter (Signed)
The pt is aware that the ERCP has been cancelled and an appt has been made for the pt to see [name] on 4/1 to discuss further.

## 2020-01-20 NOTE — Telephone Encounter (Signed)
Please schedule him for first available in April, to conform to what the patient and the prescribing provider want in regards to coming off anticoagulation.  I am OK to maintain this for the time, but if he has recurrent pancreatitis we may end up needing to do it sooner. Thank you. GM

## 2020-01-22 ENCOUNTER — Telehealth (INDEPENDENT_AMBULATORY_CARE_PROVIDER_SITE_OTHER): Payer: Self-pay

## 2020-01-22 NOTE — Telephone Encounter (Signed)
Patient called requesting a referral to a pain clinic. Patient is aware that PCP does not prescribe any pain medication and would like to either have a referral put in place or if needed he would like to make an appointment with Marcelino Duster in order for her to refer him.  Please advice which option is best   [phone]

## 2020-01-23 NOTE — Telephone Encounter (Signed)
Sent to PCP ?

## 2020-01-26 ENCOUNTER — Encounter (HOSPITAL_COMMUNITY): Payer: Self-pay | Admitting: Emergency Medicine

## 2020-01-26 ENCOUNTER — Emergency Department (HOSPITAL_COMMUNITY): Payer: Self-pay

## 2020-01-26 ENCOUNTER — Other Ambulatory Visit: Payer: Self-pay

## 2020-01-26 ENCOUNTER — Inpatient Hospital Stay (HOSPITAL_COMMUNITY)
Admission: EM | Admit: 2020-01-26 | Discharge: 2020-02-02 | DRG: 381 | Disposition: A | Discharge status: Home / Self Care | Payer: Self-pay | Attending: Internal Medicine | Admitting: Internal Medicine

## 2020-01-26 DIAGNOSIS — E861 Hypovolemia: Secondary | ICD-10-CM | POA: Diagnosis present

## 2020-01-26 DIAGNOSIS — E876 Hypokalemia: Secondary | ICD-10-CM | POA: Diagnosis present

## 2020-01-26 DIAGNOSIS — Z86718 Personal history of other venous thrombosis and embolism: Secondary | ICD-10-CM

## 2020-01-26 DIAGNOSIS — K2970 Gastritis, unspecified, without bleeding: Secondary | ICD-10-CM

## 2020-01-26 DIAGNOSIS — N179 Acute kidney failure, unspecified: Secondary | ICD-10-CM | POA: Diagnosis present

## 2020-01-26 DIAGNOSIS — K311 Adult hypertrophic pyloric stenosis: Principal | ICD-10-CM | POA: Diagnosis present

## 2020-01-26 DIAGNOSIS — K2100 Gastro-esophageal reflux disease with esophagitis, without bleeding: Secondary | ICD-10-CM | POA: Diagnosis present

## 2020-01-26 DIAGNOSIS — K859 Acute pancreatitis without necrosis or infection, unspecified: Secondary | ICD-10-CM | POA: Diagnosis present

## 2020-01-26 DIAGNOSIS — R945 Abnormal results of liver function studies: Secondary | ICD-10-CM | POA: Diagnosis present

## 2020-01-26 DIAGNOSIS — R7989 Other specified abnormal findings of blood chemistry: Secondary | ICD-10-CM | POA: Diagnosis present

## 2020-01-26 DIAGNOSIS — E860 Dehydration: Secondary | ICD-10-CM | POA: Diagnosis present

## 2020-01-26 DIAGNOSIS — K861 Other chronic pancreatitis: Secondary | ICD-10-CM | POA: Diagnosis present

## 2020-01-26 DIAGNOSIS — F17210 Nicotine dependence, cigarettes, uncomplicated: Secondary | ICD-10-CM | POA: Diagnosis present

## 2020-01-26 DIAGNOSIS — R112 Nausea with vomiting, unspecified: Secondary | ICD-10-CM | POA: Diagnosis present

## 2020-01-26 DIAGNOSIS — D582 Other hemoglobinopathies: Secondary | ICD-10-CM | POA: Diagnosis present

## 2020-01-26 DIAGNOSIS — Z20822 Contact with and (suspected) exposure to covid-19: Secondary | ICD-10-CM | POA: Diagnosis present

## 2020-01-26 LAB — CBC
HCT: 58.4 % — ABNORMAL HIGH (ref 39.0–52.0)
Hemoglobin: 20.4 g/dL — ABNORMAL HIGH (ref 13.0–17.0)
MCH: 31.1 pg (ref 26.0–34.0)
MCHC: 34.9 g/dL (ref 30.0–36.0)
MCV: 89 fL (ref 80.0–100.0)
Platelets: 332 10*3/uL (ref 150–400)
RBC: 6.56 MIL/uL — ABNORMAL HIGH (ref 4.22–5.81)
RDW: 14.1 % (ref 11.5–15.5)
WBC: 12.7 10*3/uL — ABNORMAL HIGH (ref 4.0–10.5)
nRBC: 0 % (ref 0.0–0.2)

## 2020-01-26 LAB — COMPREHENSIVE METABOLIC PANEL
ALT: 49 U/L — ABNORMAL HIGH (ref 0–44)
AST: 37 U/L (ref 15–41)
Albumin: 5.5 g/dL — ABNORMAL HIGH (ref 3.5–5.0)
Alkaline Phosphatase: 200 U/L — ABNORMAL HIGH (ref 38–126)
Anion gap: 28 — ABNORMAL HIGH (ref 5–15)
BUN: 50 mg/dL — ABNORMAL HIGH (ref 6–20)
CO2: 36 mmol/L — ABNORMAL HIGH (ref 22–32)
Calcium: 10.1 mg/dL (ref 8.9–10.3)
Chloride: 70 mmol/L — ABNORMAL LOW (ref 98–111)
Creatinine, Ser: 2.45 mg/dL — ABNORMAL HIGH (ref 0.61–1.24)
GFR calc Af Amer: 37 mL/min — ABNORMAL LOW (ref >60)
GFR calc non Af Amer: 32 mL/min — ABNORMAL LOW (ref >60)
Glucose, Bld: 183 mg/dL — ABNORMAL HIGH (ref 70–99)
Potassium: 3.1 mmol/L — ABNORMAL LOW (ref 3.5–5.1)
Sodium: 134 mmol/L — ABNORMAL LOW (ref 135–145)
Total Bilirubin: 1.6 mg/dL — ABNORMAL HIGH (ref 0.3–1.2)
Total Protein: 10.3 g/dL — ABNORMAL HIGH (ref 6.5–8.1)

## 2020-01-26 LAB — LIPASE, BLOOD: Lipase: 97 U/L — ABNORMAL HIGH (ref 11–51)

## 2020-01-26 MED ORDER — MORPHINE SULFATE (PF) 4 MG/ML IV SOLN
4.0000 mg | Freq: Once | INTRAVENOUS | Status: AC
  Administered 2020-01-26: 4 mg via INTRAVENOUS
  Filled 2020-01-26: qty 1

## 2020-01-26 MED ORDER — POTASSIUM CHLORIDE IN NACL 20-0.9 MEQ/L-% IV SOLN
INTRAVENOUS | Status: AC
  Filled 2020-01-26: qty 1000

## 2020-01-26 MED ORDER — SODIUM CHLORIDE 0.9 % IV BOLUS (SEPSIS)
1000.0000 mL | Freq: Once | INTRAVENOUS | Status: AC
  Administered 2020-01-26: 1000 mL via INTRAVENOUS

## 2020-01-26 MED ORDER — ONDANSETRON HCL 4 MG/2ML IJ SOLN
4.0000 mg | Freq: Four times a day (QID) | INTRAMUSCULAR | Status: DC | PRN
  Administered 2020-01-27 – 2020-02-02 (×9): 4 mg via INTRAVENOUS
  Filled 2020-01-26 (×9): qty 2

## 2020-01-26 MED ORDER — SODIUM CHLORIDE 0.9% FLUSH
3.0000 mL | Freq: Once | INTRAVENOUS | Status: AC
  Administered 2020-01-27: 3 mL via INTRAVENOUS

## 2020-01-26 MED ORDER — PROMETHAZINE HCL 25 MG/ML IJ SOLN
12.5000 mg | Freq: Four times a day (QID) | INTRAMUSCULAR | Status: DC | PRN
  Administered 2020-01-27: 12.5 mg via INTRAVENOUS
  Filled 2020-01-26: qty 1

## 2020-01-26 MED ORDER — ONDANSETRON HCL 4 MG/2ML IJ SOLN
4.0000 mg | Freq: Once | INTRAMUSCULAR | Status: AC
  Administered 2020-01-26: 4 mg via INTRAVENOUS
  Filled 2020-01-26: qty 2

## 2020-01-26 MED ORDER — SODIUM CHLORIDE 0.9 % IV BOLUS (SEPSIS)
1000.0000 mL | Freq: Once | INTRAVENOUS | Status: AC
  Administered 2020-01-27: 1000 mL via INTRAVENOUS

## 2020-01-26 MED ORDER — HYDROMORPHONE HCL 1 MG/ML IJ SOLN
1.0000 mg | Freq: Once | INTRAMUSCULAR | Status: AC
  Administered 2020-01-27: 1 mg via INTRAVENOUS
  Filled 2020-01-26: qty 1

## 2020-01-26 NOTE — ED Triage Notes (Signed)
Patient here from home with complaints of abd pain "all over" and n/v that started today. Hx of same. States that he has pancreatitis and is scheduled for a procedure.

## 2020-01-26 NOTE — ED Provider Notes (Signed)
[facility]-EMERGENCY DEPT Provider Note   CSN: [id number] Arrival date & time: 01/26/20  1911     History Chief Complaint  Patient presents with  . Abdominal Pain  . Nausea  . Emesis  . Back Pain    Paul Allen is a 39 y.o. male.  Paul Allen is a 39 y.o. male with medical history significant of recurrent pancreatitis initially alcoholic who has been sober for couple of months but also has known portal vein thrombosis and SMV thrombosis with stent placement, on eliquis. He has had recent admission for the same 01/10/20. GI consulted and he was d/c home. He reports he has not improved since going home.  Reports that he was unable to keep anything down and has constant nausea and vomiting.  Reports weight loss.  Reports normal bowel movements.  Reports decreased urination but no pain with urination.  Denies any fever, chills.  Reports he still is not drinking alcohol.        Past Medical History:  Diagnosis Date  . Abdominal pain   . Mesenteric vein thrombosis (HCC)    Dec 2020 in setting of pancreatitis  . Nausea & vomiting   . Pancreatitis     Patient Active Problem List   Diagnosis Date Noted  . Chronic alcoholic pancreatitis (HCC) 01/07/2020  . ARF (acute renal failure) (HCC) 01/07/2020  . Pancreatic duct leak   . Choledocholithiasis   . Bile leak   . Pancreatic ascites   . Abdominal pain, epigastric   . Portal vein thrombosis   . Pancreatitis, acute 12/03/2019  . Elevated LFTs   . Elevated hemoglobin (HCC)   . Elevated bilirubin 11/28/2019  . Effusion, right knee   . Acute pancreatitis 10/25/2018  . Acute alcoholic pancreatitis 10/24/2018  . Colitis 10/24/2018  . Hyponatremia 10/24/2018  . Hypokalemia 10/24/2018  . Macrocytosis 10/24/2018  . Alcohol abuse 10/24/2018    Past Surgical History:  Procedure Laterality Date  . APPENDECTOMY    . ERCP N/A 12/06/2019   Procedure: ENDOSCOPIC RETROGRADE CHOLANGIOPANCREATOGRAPHY  (ERCP);  Surgeon: Rachael Fee, MD;  Location: [facility];  Service: Endoscopy;  Laterality: N/A;  Aborted Procedure  . ERCP N/A 12/11/2019   Procedure: ENDOSCOPIC RETROGRADE CHOLANGIOPANCREATOGRAPHY (ERCP);  Surgeon: Lemar Lofty., MD;  Location: [facility];  Service: Gastroenterology;  Laterality: N/A;  . PANCREATIC STENT PLACEMENT  12/11/2019   Procedure: PANCREATIC STENT PLACEMENT;  Surgeon: Lemar Lofty., MD;  Location: [facility];  Service: Gastroenterology;;  . Dennison Mascot  12/11/2019   Procedure: Dennison Mascot;  Surgeon: Meridee Score Netty Starring., MD;  Location: [facility];  Service: Gastroenterology;;       Family History  Problem Relation Age of Onset  . Cholecystitis Mother   . Arthritis Mother   . Stomach cancer Father     Social History   Tobacco Use  . Smoking status: Current Every Day Smoker    Packs/day: 1.50    Types: Cigarettes  . Smokeless tobacco: Never Used  Substance Use Topics  . Alcohol use: Not Currently    Comment: hasn't drank in 4 weeks, but before would drink about 5 malt beers a day  . Drug use: Yes    Frequency: 7.0 times per week    Types: Marijuana    Home Medications Prior to Admission medications   Medication Sig Start Date End Date Taking? Authorizing Provider  apixaban (ELIQUIS) 5 MG TABS tablet Take 10 mg (2 tabs) twice daily for 1 week, then take  5 mg (1 tab) twice daily for 3 months then stop Patient taking differently: Take 5 mg by mouth 2 (two) times daily.  12/15/19  Yes Danford, Suann Larry, MD  folic acid (FOLVITE) 1 MG tablet Take 1 tablet (1 mg total) by mouth daily. 12/15/19  Yes Danford, Suann Larry, MD  pantoprazole (PROTONIX) 40 MG tablet Take 1 tablet (40 mg total) by mouth daily at 6 (six) AM. 12/16/19  Yes Danford, Suann Larry, MD  thiamine 100 MG tablet Take 1 tablet (100 mg total) by mouth daily. 12/15/19  Yes Danford, Suann Larry, MD  ondansetron (ZOFRAN-ODT) 4 MG disintegrating  tablet Take 1 tablet (4 mg total) by mouth every 8 (eight) hours as needed. Patient not taking: Reported on 01/26/2020 01/10/20   Alma Friendly, MD    Allergies    Penicillins  Review of Systems   Review of Systems  Constitutional: Positive for appetite change and fatigue. Negative for fever.  HENT: Negative for congestion and sore throat.   Respiratory: Negative for cough and shortness of breath.   Cardiovascular: Negative for chest pain.  Gastrointestinal: Positive for abdominal pain, nausea and vomiting.  Endocrine: Negative for polyuria.  Genitourinary: Positive for decreased urine volume. Negative for dysuria.  Musculoskeletal: Positive for back pain.  Skin: Negative for rash.  Allergic/Immunologic: Negative for immunocompromised state.  Neurological: Negative for dizziness.    Physical Exam Updated Vital Signs BP 124/82   Pulse 66   Temp 98.2 F (36.8 C) (Oral)   Resp 17   Ht 5\' 9"  (1.753 m)   Wt 57.8 kg   SpO2 100%   BMI 18.81 kg/m   Physical Exam Vitals and nursing note reviewed.  Constitutional:      General: He is not in acute distress.    Appearance: Normal appearance. He is not toxic-appearing or diaphoretic.     Comments: Chronically ill/malnoursihed  appearing  HENT:     Head: Normocephalic.     Mouth/Throat:     Mouth: Mucous membranes are moist.  Eyes:     Conjunctiva/sclera: Conjunctivae normal.  Cardiovascular:     Rate and Rhythm: Regular rhythm. Tachycardia present.     Heart sounds: Normal heart sounds.  Pulmonary:     Effort: Pulmonary effort is normal.  Abdominal:     General: Abdomen is flat.     Palpations: Abdomen is soft.     Tenderness: There is abdominal tenderness in the right upper quadrant. There is guarding. There is no right CVA tenderness or left CVA tenderness.  Skin:    General: Skin is dry.  Neurological:     General: No focal deficit present.     Mental Status: He is alert.  Psychiatric:        Mood and Affect:  Mood normal.     ED Results / Procedures / Treatments   Labs (all labs ordered are listed, but only abnormal results are displayed) Labs Reviewed  LIPASE, BLOOD - Abnormal; Notable for the following components:      Result Value   Lipase 97 (*)    All other components within normal limits  COMPREHENSIVE METABOLIC PANEL - Abnormal; Notable for the following components:   Sodium 134 (*)    Potassium 3.1 (*)    Chloride 70 (*)    CO2 36 (*)    Glucose, Bld 183 (*)    BUN 50 (*)    Creatinine, Ser 2.45 (*)    Total Protein 10.3 (*)  Albumin 5.5 (*)    ALT 49 (*)    Alkaline Phosphatase 200 (*)    Total Bilirubin 1.6 (*)    GFR calc non Af Amer 32 (*)    GFR calc Af Amer 37 (*)    Anion gap 28 (*)    All other components within normal limits  CBC - Abnormal; Notable for the following components:   WBC 12.7 (*)    RBC 6.56 (*)    Hemoglobin 20.4 (*)    HCT 58.4 (*)    All other components within normal limits  SARS CORONAVIRUS 2 (TAT 6-24 HRS)  URINALYSIS, ROUTINE W REFLEX MICROSCOPIC    EKG None  Radiology CT ABDOMEN PELVIS WO CONTRAST  Result Date: 01/26/2020 CLINICAL DATA:  Nausea and vomiting. EXAM: CT ABDOMEN AND PELVIS WITHOUT CONTRAST TECHNIQUE: Multidetector CT imaging of the abdomen and pelvis was performed following the standard protocol without IV contrast. COMPARISON:  January 06, 2020 FINDINGS: Lower chest: No acute abnormality. Hepatobiliary: No focal liver abnormality is seen. The areas of heterogeneous enhancement seen throughout the liver on the prior contrast enhanced CT are not clearly identified on this noncontrast enhanced study P the gallbladder is moderately distended without evidence of gallstones, gallbladder wall thickening, or biliary dilatation. Pancreas: The pancreatic duct stent is again seen and unchanged in position. The pancreatic parenchyma is normal in appearance. Spleen: Normal in size without focal abnormality. Adrenals/Urinary Tract:  Adrenal glands are unremarkable. Kidneys are normal, without renal calculi, focal lesion, or hydronephrosis. Bladder is unremarkable. Stomach/Bowel: There is marked severity gastric distension. The visualized loops of large and small bowel are normal in caliber. The appendix is surgically absent. No evidence of bowel wall thickening, distention, or inflammatory changes. Noninflamed diverticula are seen throughout the sigmoid colon. Vascular/Lymphatic: There is moderate severity aortic atherosclerosis. No enlarged abdominal or pelvic lymph nodes. Reproductive: Prostate is unremarkable. Other: No abdominal wall hernia or abnormality. No abdominopelvic ascites. Musculoskeletal: No acute or significant osseous findings. IMPRESSION: 1. Marked severity gastric distension, of uncertain etiology. Sequelae associated with gastric outlet obstruction cannot be excluded. 2. Stable pancreatic duct stent positioning. 3. Sigmoid diverticulosis. Aortic Atherosclerosis (ICD10-I70.0). Electronically Signed   By: Aram Candela M.D.   On: 01/26/2020 22:49    Procedures Procedures (including critical care time)  Medications Ordered in ED Medications  sodium chloride flush (NS) 0.9 % injection 3 mL (has no administration in time range)  sodium chloride 0.9 % bolus 1,000 mL (1,000 mLs Intravenous New Bag/Given 01/26/20 2254)  sodium chloride 0.9 % bolus 1,000 mL (has no administration in time range)  HYDROmorphone (DILAUDID) injection 1 mg (has no administration in time range)  sodium chloride 0.9 % bolus 1,000 mL (1,000 mLs Intravenous New Bag/Given 01/26/20 2110)  morphine 4 MG/ML injection 4 mg (4 mg Intravenous Given 01/26/20 2109)  ondansetron (ZOFRAN) injection 4 mg (4 mg Intravenous Given 01/26/20 2108)    ED Course  I have reviewed the triage vital signs and the nursing notes.  Pertinent labs & imaging results that were available during my care of the patient were reviewed by me and considered in my medical  decision making (see chart for details).  Clinical Course as of Jan 25 2315  Wynelle Link Jan 26, 2020  [id number] 39 year old male with history of chronic pancreatitis here with abdominal pain nausea vomiting again.  His labs are all abnormal with a lot of volume contracture and acute dehydration.  He will need admission for IV repletion along with pain control.   [  MB]  2315 Spoke with hospitalist for admission   [KM]    Clinical Course User Index [KM] Arlyn Dunning, PA-C [MB] Terrilee Files, MD   MDM Rules/Calculators/A&P                      The patient appears reasonably stabilized for admission considering the current resources, flow, and capabilities available in the ED at this time, and I doubt any other [facility] requiring further screening and/or treatment in the ED prior to admission.  Final Clinical Impression(s) / ED Diagnoses Final diagnoses:  Acute renal failure, unspecified acute renal failure type (HCC)  Chronic pancreatitis, unspecified pancreatitis type [facility])  Dehydration    Rx / DC Orders ED Discharge Orders    None       Jeral Pinch 01/26/20 2316    Terrilee Files, MD 01/27/20 [id number]

## 2020-01-27 ENCOUNTER — Inpatient Hospital Stay (HOSPITAL_COMMUNITY): Payer: Self-pay

## 2020-01-27 ENCOUNTER — Other Ambulatory Visit: Payer: Self-pay

## 2020-01-27 DIAGNOSIS — R1013 Epigastric pain: Secondary | ICD-10-CM

## 2020-01-27 DIAGNOSIS — G8929 Other chronic pain: Secondary | ICD-10-CM

## 2020-01-27 DIAGNOSIS — K55069 Acute infarction of intestine, part and extent unspecified: Secondary | ICD-10-CM

## 2020-01-27 DIAGNOSIS — K311 Adult hypertrophic pyloric stenosis: Principal | ICD-10-CM

## 2020-01-27 DIAGNOSIS — R112 Nausea with vomiting, unspecified: Secondary | ICD-10-CM

## 2020-01-27 DIAGNOSIS — K861 Other chronic pancreatitis: Secondary | ICD-10-CM

## 2020-01-27 LAB — COMPREHENSIVE METABOLIC PANEL
ALT: 30 U/L (ref 0–44)
AST: 23 U/L (ref 15–41)
Albumin: 3.9 g/dL (ref 3.5–5.0)
Alkaline Phosphatase: 117 U/L (ref 38–126)
Anion gap: 15 (ref 5–15)
BUN: 54 mg/dL — ABNORMAL HIGH (ref 6–20)
CO2: 34 mmol/L — ABNORMAL HIGH (ref 22–32)
Calcium: 8.2 mg/dL — ABNORMAL LOW (ref 8.9–10.3)
Chloride: 85 mmol/L — ABNORMAL LOW (ref 98–111)
Creatinine, Ser: 1.9 mg/dL — ABNORMAL HIGH (ref 0.61–1.24)
GFR calc Af Amer: 50 mL/min — ABNORMAL LOW (ref >60)
GFR calc non Af Amer: 43 mL/min — ABNORMAL LOW (ref >60)
Glucose, Bld: 101 mg/dL — ABNORMAL HIGH (ref 70–99)
Potassium: 2.7 mmol/L — CL (ref 3.5–5.1)
Sodium: 134 mmol/L — ABNORMAL LOW (ref 135–145)
Total Bilirubin: 0.9 mg/dL (ref 0.3–1.2)
Total Protein: 6.6 g/dL (ref 6.5–8.1)

## 2020-01-27 LAB — RAPID URINE DRUG SCREEN, HOSP PERFORMED
Amphetamines: NOT DETECTED
Barbiturates: NOT DETECTED
Benzodiazepines: NOT DETECTED
Cocaine: NOT DETECTED
Opiates: POSITIVE — AB
Tetrahydrocannabinol: POSITIVE — AB

## 2020-01-27 LAB — URINALYSIS, ROUTINE W REFLEX MICROSCOPIC
Glucose, UA: 50 mg/dL — AB
Hgb urine dipstick: NEGATIVE
Ketones, ur: 5 mg/dL — AB
Leukocytes,Ua: NEGATIVE
Nitrite: NEGATIVE
Protein, ur: 30 mg/dL — AB
Specific Gravity, Urine: 1.018 (ref 1.005–1.030)
pH: 5 (ref 5.0–8.0)

## 2020-01-27 LAB — ETHANOL: Alcohol, Ethyl (B): <10 mg/dL (ref <10)

## 2020-01-27 LAB — APTT: aPTT: 32 seconds (ref 24–36)

## 2020-01-27 LAB — HEPARIN LEVEL (UNFRACTIONATED): Heparin Unfractionated: 1.48 IU/mL — ABNORMAL HIGH (ref 0.30–0.70)

## 2020-01-27 LAB — CK TOTAL AND CKMB (NOT AT ARMC)
CK, MB: 0.4 ng/mL — ABNORMAL LOW (ref 0.5–5.0)
Relative Index: INVALID (ref 0.0–2.5)
Total CK: 32 U/L — ABNORMAL LOW (ref 49–397)

## 2020-01-27 LAB — CBC
HCT: 43.6 % (ref 39.0–52.0)
Hemoglobin: 15.3 g/dL (ref 13.0–17.0)
MCH: 31.6 pg (ref 26.0–34.0)
MCHC: 35.1 g/dL (ref 30.0–36.0)
MCV: 90.1 fL (ref 80.0–100.0)
Platelets: 214 10*3/uL (ref 150–400)
RBC: 4.84 MIL/uL (ref 4.22–5.81)
RDW: 13.7 % (ref 11.5–15.5)
WBC: 10 10*3/uL (ref 4.0–10.5)
nRBC: 0 % (ref 0.0–0.2)

## 2020-01-27 LAB — HEPATITIS PANEL, ACUTE
HCV Ab: NONREACTIVE
Hep A IgM: NONREACTIVE
Hep B C IgM: NONREACTIVE
Hepatitis B Surface Ag: NONREACTIVE

## 2020-01-27 LAB — LIPASE, BLOOD: Lipase: 72 U/L — ABNORMAL HIGH (ref 11–51)

## 2020-01-27 LAB — SARS CORONAVIRUS 2 (TAT 6-24 HRS): SARS Coronavirus 2: NEGATIVE

## 2020-01-27 MED ORDER — FOLIC ACID 1 MG PO TABS
1.0000 mg | ORAL_TABLET | Freq: Every day | ORAL | Status: DC
  Administered 2020-01-27 – 2020-02-02 (×5): 1 mg via ORAL
  Filled 2020-01-27 (×5): qty 1

## 2020-01-27 MED ORDER — HYDROMORPHONE HCL 1 MG/ML IJ SOLN
0.5000 mg | INTRAMUSCULAR | Status: DC | PRN
  Administered 2020-01-27 – 2020-02-02 (×25): 0.5 mg via INTRAVENOUS
  Filled 2020-01-27 (×25): qty 0.5

## 2020-01-27 MED ORDER — POTASSIUM CHLORIDE CRYS ER 20 MEQ PO TBCR: 40.0000 meq | EXTENDED_RELEASE_TABLET | Freq: Two times a day (BID) | ORAL | Status: DC

## 2020-01-27 MED ORDER — HEPARIN (PORCINE) 25000 UT/250ML-% IV SOLN
1000.0000 [IU]/h | INTRAVENOUS | Status: DC
  Administered 2020-01-27: 1000 [IU]/h via INTRAVENOUS
  Filled 2020-01-27: qty 250

## 2020-01-27 MED ORDER — THIAMINE HCL 100 MG PO TABS
100.0000 mg | ORAL_TABLET | Freq: Every day | ORAL | Status: DC
  Administered 2020-01-27 – 2020-02-02 (×5): 100 mg via ORAL
  Filled 2020-01-27 (×5): qty 1

## 2020-01-27 MED ORDER — KCL-LACTATED RINGERS 20 MEQ/L IV SOLN
INTRAVENOUS | Status: DC
  Filled 2020-01-27: qty 1000

## 2020-01-27 MED ORDER — POTASSIUM CHLORIDE 10 MEQ/100ML IV SOLN
10.0000 meq | INTRAVENOUS | Status: AC
  Administered 2020-01-27 (×4): 10 meq via INTRAVENOUS
  Filled 2020-01-27 (×4): qty 100

## 2020-01-27 MED ORDER — PANTOPRAZOLE SODIUM 40 MG IV SOLR
40.0000 mg | Freq: Two times a day (BID) | INTRAVENOUS | Status: DC
  Administered 2020-01-27 – 2020-02-02 (×14): 40 mg via INTRAVENOUS
  Filled 2020-01-27 (×14): qty 40

## 2020-01-27 MED ORDER — POTASSIUM CHLORIDE 2 MEQ/ML IV SOLN
INTRAVENOUS | Status: DC
  Filled 2020-01-27 (×15): qty 1000

## 2020-01-27 MED ORDER — APIXABAN 5 MG PO TABS
5.0000 mg | ORAL_TABLET | Freq: Two times a day (BID) | ORAL | Status: DC
  Administered 2020-01-27: 5 mg via ORAL

## 2020-01-27 MED ORDER — IOHEXOL 300 MG/ML  SOLN
50.0000 mL | Freq: Once | INTRAMUSCULAR | Status: AC | PRN
  Administered 2020-01-27: 10 mL via ORAL

## 2020-01-27 MED ORDER — LORAZEPAM 2 MG/ML IJ SOLN
1.0000 mg | Freq: Once | INTRAMUSCULAR | Status: AC
  Administered 2020-01-27: 1 mg via INTRAVENOUS
  Filled 2020-01-27: qty 1

## 2020-01-27 NOTE — Plan of Care (Signed)

## 2020-01-27 NOTE — H&P (Signed)
[facility] H&P    Patient Demographics:    Paul Allen, is a 39 y.o. male  MRN: [id number]  DOB - 05-16-1981  Admit Date - 01/26/2020  Referring MD/NP/PA:  Madilyn Hook  Outpatient Primary MD for the patient is Kerin Perna, NP  Patient coming from:  home  Chief complaint- n/v, abd pain   HPI:    Thaison Kolodziejski  is a 39 y.o. male,  w hx of alcoholic pancreatitis , portal venous thrombosis, presents with n/v and generalized abdominal pain since Thursday nite.  Pt denies etoh use for months.  Pt denies fever, chills, cough, cp, palp, sob, diarrhea, brbpr, black stool, dysuria, hematuria. Pt tried to stick to liquids, and his condition was not improving and thus presented to ED for evaluation.   In ED,  T 98.2, P 104, R 16, Bp 115/87  Pox 100% on RA Wt 57.8kg  CT abd/ pelvis IMPRESSION: 1. Marked severity gastric distension, of uncertain etiology. Sequelae associated with gastric outlet obstruction cannot be excluded. 2. Stable pancreatic duct stent positioning. 3. Sigmoid diverticulosis.  Lipase 97 (prev 86) Na 134, K 3.1, Bun 50, Creatinine 2.45 Ast 37, Alt 49, Alk phos 200, T. Bili 1.6 Wbc 12.7, Hgb 20.4, Plt 332  UDS pending covid-19 pending etoh pending  Pt receive 1L of ns iv x3 in ED, along with dilaudid 29m iv x1, and morhpine 441miv x1, and zofran 39m37mv x1.    Pt will be admitted for n/v, abd pain, secondary to gastric outlet obstruction ?, abnormal liver function, acute pancreatitis, and hypokalemia.     Review of systems:    In addition to the HPI above,  No Fever-chills, No Headache, No changes with Vision or hearing, No problems swallowing food or Liquids, No Chest pain, Cough or Shortness of Breath,  No Blood in stool or Urine, No dysuria, No new skin rashes or bruises, No new joints pains-aches,  No new weakness, tingling, numbness in any extremity, No recent weight  gain or loss, No polyuria, polydypsia or polyphagia, No significant Mental Stressors.  All other systems reviewed and are negative.    Past History of the following :    Past Medical History:  Diagnosis Date  . Abdominal pain   . [place]  Dec 2020 in setting of pancreatitis  . Nausea & vomiting   . Pancreatitis       Past Surgical History:  Procedure Laterality Date  . APPENDECTOMY    . ERCP N/A 12/06/2019   Procedure: ENDOSCOPIC RETROGRADE CHOLANGIOPANCREATOGRAPHY (ERCP);  Surgeon: JacMilus BanisterD;  [facility];  Service: Endoscopy;  Laterality: N/A;  Aborted Procedure  . ERCP N/A 12/11/2019   Procedure: ENDOSCOPIC RETROGRADE CHOLANGIOPANCREATOGRAPHY (ERCP);  Surgeon: ManIrving CopasMD;  [facility];  Service: Gastroenterology;  Laterality: N/A;  . PANCREATIC STENT PLACEMENT  12/11/2019   Procedure: PANCREATIC STENT PLACEMENT;  Surgeon: ManIrving CopasMD;  Location: [facility];  Service: Gastroenterology;;  . SPHJoan Mayans2/23/2020   Procedure: SPHINCTEROTOMY;  Surgeon: Rush Landmark Telford Nab., MD;  [facility];  Service: Gastroenterology;;      Social History:      Social History   Tobacco Use  . Smoking status: Current Every Day Smoker    Packs/day: 1.50    Types: Cigarettes  . Smokeless tobacco: Never Used  Substance Use Topics  . Alcohol use: Not Currently    Comment: hasn't drank in 4 weeks, but before would drink about 5 malt beers a day       Family History :     Family History  Problem Relation Age of Onset  . Cholecystitis Mother   . Arthritis Mother   . Stomach cancer Father        Home Medications:   Prior to Admission medications   Medication Sig Start Date End Date Taking? Authorizing Provider  apixaban (ELIQUIS) 5 MG TABS tablet Take 10 mg (2 tabs) twice daily for 1 week, then take 5 mg (1 tab) twice daily for 3 months then stop Patient taking  differently: Take 5 mg by mouth 2 (two) times daily.  12/15/19  Yes Danford, Suann Larry, MD  folic acid (FOLVITE) 1 MG tablet Take 1 tablet (1 mg total) by mouth daily. 12/15/19  Yes Danford, Suann Larry, MD  pantoprazole (PROTONIX) 40 MG tablet Take 1 tablet (40 mg total) by mouth daily at 6 (six) AM. 12/16/19  Yes Danford, Suann Larry, MD  thiamine 100 MG tablet Take 1 tablet (100 mg total) by mouth daily. 12/15/19  Yes Danford, Suann Larry, MD  ondansetron (ZOFRAN-ODT) 4 MG disintegrating tablet Take 1 tablet (4 mg total) by mouth every 8 (eight) hours as needed. Patient not taking: Reported on 01/26/2020 01/10/20   Alma Friendly, MD     Allergies:     Allergies  Allergen Reactions  . Penicillins Rash    Has patient had a PCN reaction causing immediate rash, facial/tongue/throat swelling, SOB or lightheadedness with hypotension: yes Has patient had a PCN reaction causing severe rash involving mucus membranes or skin necrosis: Yes Has patient had a PCN reaction that required hospitalization: No Has patient had a PCN reaction occurring within the last 10 years: No If all of the above answers are "NO", then may proceed with Cephalosporin use.      Physical Exam:   Vitals  Blood pressure 124/82, pulse 66, temperature 98.2 F (36.8 C), temperature source Oral, resp. rate 17, height '5\' 9"'  (1.753 m), weight 57.8 kg, SpO2 100 %.  1.  General: axoxo3  2. Psychiatric: euthymic  3. Neurologic: nonfocal  4. HEENMT:  Anicteric, pupils 1.34m symmetric, direct, consensual intact Neck: no jvd  5. Respiratory : CTAB  6. Cardiovascular : rrr s1, s2, no m/g/r  7. Gastrointestinal:  Abd: soft, nt, nd, +bs (normoactive) Negative murphy sign  8. Skin:  Ext: no c/c/e, no rash  9.Musculoskeletal:  Good ROM    Data Review:    CBC Recent Labs  Lab 01/26/20 2029  WBC 12.7*  HGB 20.4*  HCT 58.4*  PLT 332  MCV 89.0  MCH 31.1  MCHC 34.9  RDW 14.1    ------------------------------------------------------------------------------------------------------------------  Results for orders placed or performed during the hospital encounter of 01/26/20 (from the past 48 hour(s))  Lipase, blood     Status: Abnormal   Collection Time: 01/26/20  8:29 PM  Result Value Ref Range   Lipase 97 (H) 11 - 51 U/L    Comment: [facility] [address]  [address], [place], [place] [address]  Comprehensive metabolic panel     Status: Abnormal   Collection Time: 01/26/20  8:29 PM  Result Value Ref Range   Sodium 134 (L) 135 - 145 mmol/L   Potassium 3.1 (L) 3.5 - 5.1 mmol/L   Chloride 70 (L) 98 - 111 mmol/L   CO2 36 (H) 22 - 32 mmol/L   Glucose, Bld 183 (H) 70 - 99 mg/dL   BUN 50 (H) 6 - 20 mg/dL   Creatinine, Ser 2.45 (H) 0.61 - 1.24 mg/dL   Calcium 10.1 8.9 - 10.3 mg/dL   Total Protein 10.3 (H) 6.5 - 8.1 g/dL   Albumin 5.5 (H) 3.5 - 5.0 g/dL   AST 37 15 - 41 U/L   ALT 49 (H) 0 - 44 U/L   Alkaline Phosphatase 200 (H) 38 - 126 U/L   Total Bilirubin 1.6 (H) 0.3 - 1.2 mg/dL   GFR calc non Af Amer 32 (L) >60 mL/min   GFR calc Af Amer 37 (L) >60 mL/min   Anion gap 28 (H) 5 - 15    Comment: RESULT CHECKED Performed at [facility], [place] [address] [place], [place] [address]   CBC     Status: Abnormal   Collection Time: 01/26/20  8:29 PM  Result Value Ref Range   WBC 12.7 (H) 4.0 - 10.5 K/uL   RBC 6.56 (H) 4.22 - 5.81 MIL/uL   Hemoglobin 20.4 (H) 13.0 - 17.0 g/dL   HCT 58.4 (H) 39.0 - 52.0 %   MCV 89.0 80.0 - 100.0 fL   MCH 31.1 26.0 - 34.0 pg   MCHC 34.9 30.0 - 36.0 g/dL   RDW 14.1 11.5 - 15.5 %   Platelets 332 150 - 400 K/uL   nRBC 0.0 0.0 - 0.2 %    Comment: Performed at [facility], [place] [address], [place], [place] [address]    Chemistries  Recent Labs  Lab 01/26/20 2029  NA 134*  K 3.1*  CL 70*  CO2 36*  GLUCOSE 183*  BUN 50*  CREATININE 2.45*  CALCIUM 10.1  AST 37   ALT 49*  ALKPHOS 200*  BILITOT 1.6*   ------------------------------------------------------------------------------------------------------------------  ------------------------------------------------------------------------------------------------------------------ GFR: Estimated Creatinine Clearance: 33.1 mL/min (A) (by C-G formula based on SCr of 2.45 mg/dL (H)). Liver Function Tests: Recent Labs  Lab 01/26/20 2029  AST 37  ALT 49*  ALKPHOS 200*  BILITOT 1.6*  PROT 10.3*  ALBUMIN 5.5*   Recent Labs  Lab 01/26/20 2029  LIPASE 97*   No results for input(s): AMMONIA in the last 168 hours. Coagulation Profile: No results for input(s): INR, PROTIME in the last 168 hours. Cardiac Enzymes: No results for input(s): CKTOTAL, CKMB, CKMBINDEX, TROPONINI in the last 168 hours. BNP (last 3 results) No results for input(s): PROBNP in the last 8760 hours. HbA1C: No results for input(s): HGBA1C in the last 72 hours. CBG: No results for input(s): GLUCAP in the last 168 hours. Lipid Profile: No results for input(s): CHOL, HDL, LDLCALC, TRIG, CHOLHDL, LDLDIRECT in the last 72 hours. Thyroid Function Tests: No results for input(s): TSH, T4TOTAL, FREET4, T3FREE, THYROIDAB in the last 72 hours. Anemia Panel: No results for input(s): VITAMINB12, FOLATE, FERRITIN, TIBC, IRON, RETICCTPCT in the last 72 hours.  --------------------------------------------------------------------------------------------------------------- Urine analysis:    Component Value Date/Time   COLORURINE AMBER (A) 01/08/2020 0040   APPEARANCEUR CLOUDY (A) 01/08/2020 0040   LABSPEC 1.023 01/08/2020 0040   PHURINE 5.0 01/08/2020 0040  GLUCOSEU NEGATIVE 01/08/2020 0040   HGBUR NEGATIVE 01/08/2020 0040   BILIRUBINUR NEGATIVE 01/08/2020 0040   KETONESUR 5 (A) 01/08/2020 0040   PROTEINUR 30 (A) 01/08/2020 0040   UROBILINOGEN 1.0 01/13/2008 2059   NITRITE NEGATIVE 01/08/2020 0040   LEUKOCYTESUR NEGATIVE  01/08/2020 0040      Imaging Results:    CT ABDOMEN PELVIS WO CONTRAST  Result Date: 01/26/2020 CLINICAL DATA:  Nausea and vomiting. EXAM: CT ABDOMEN AND PELVIS WITHOUT CONTRAST TECHNIQUE: Multidetector CT imaging of the abdomen and pelvis was performed following the standard protocol without IV contrast. COMPARISON:  January 06, 2020 FINDINGS: Lower chest: No acute abnormality. Hepatobiliary: No focal liver abnormality is seen. The areas of heterogeneous enhancement seen throughout the liver on the prior contrast enhanced CT are not clearly identified on this noncontrast enhanced study P the gallbladder is moderately distended without evidence of gallstones, gallbladder wall thickening, or biliary dilatation. Pancreas: The pancreatic duct stent is again seen and unchanged in position. The pancreatic parenchyma is normal in appearance. Spleen: Normal in size without focal abnormality. Adrenals/Urinary Tract: Adrenal glands are unremarkable. Kidneys are normal, without renal calculi, focal lesion, or hydronephrosis. Bladder is unremarkable. Stomach/Bowel: There is marked severity gastric distension. The visualized loops of large and small bowel are normal in caliber. The appendix is surgically absent. No evidence of bowel wall thickening, distention, or inflammatory changes. Noninflamed diverticula are seen throughout the sigmoid colon. Vascular/Lymphatic: There is moderate severity aortic atherosclerosis. No enlarged abdominal or pelvic lymph nodes. Reproductive: Prostate is unremarkable. Other: No abdominal wall hernia or abnormality. No abdominopelvic ascites. Musculoskeletal: No acute or significant osseous findings. IMPRESSION: 1. Marked severity gastric distension, of uncertain etiology. Sequelae associated with gastric outlet obstruction cannot be excluded. 2. Stable pancreatic duct stent positioning. 3. Sigmoid diverticulosis. Aortic Atherosclerosis (ICD10-I70.0). Electronically Signed   By: Virgina Norfolk M.D.   On: 01/26/2020 22:49       Assessment & Plan:    Principal Problem:   Nausea & vomiting Active Problems:   Hypokalemia   Pancreatitis, acute   Elevated LFTs   Elevated hemoglobin (HCC)   ARF (acute renal failure) (HCC)  N/v  ? Secondary to gastric outlet obstruction NPO Ns iv Protonix 58m iv bid Zofran 474miv q6h prn  Phenergan 12.34m7mv q6h prn  If zofran not effective Recommended NGT to low intermittent suction, declined by patient at this time Please consult GI in am  Abdominal pain secondary to ? Gastric outlet obstruction, acute pancreatitis NPO Ns iv Dilaudid 0.34mg57m q4h prn  Check lipase in am  Abnormal liver function Check acute hepatitis panel Consider MRCP / ERCP Please consult GI in am  H/o portal vein thrombosis Cont Eliquis pharmacy to dose  ARF Hydrate with ns iv Check cmp in am  Hypokalemia Replete Check cmp in am   DVT Prophylaxis-   Lovenox - SCDs   AM Labs Ordered, also please review Full Orders  Family Communication: Admission, patients condition and plan of care including tests being ordered have been discussed with the patient  who indicate understanding and agree with the plan and Code Status.  Code Status:  FULL CODE per patient, pt states that he already contacted his mother and that he doesn't feel the need for me to contact her at this time.   Admission status: Observation : Based on patients clinical presentation and evaluation of above clinical data, I have made determination that patient meets observation criteria at this time.  Time spent in minutes : 70 minutes   Jani Gravel M.D on 01/27/2020 at 12:13 AM

## 2020-01-27 NOTE — Progress Notes (Signed)
ANTICOAGULATION CONSULT NOTE - Initial Consult  Pharmacy Consult for Heparin Indication: Portal vein thrombosis  Allergies  Allergen Reactions  . Penicillins Rash    Has patient had a PCN reaction causing immediate rash, facial/tongue/throat swelling, SOB or lightheadedness with hypotension: yes Has patient had a PCN reaction causing severe rash involving mucus membranes or skin necrosis: Yes Has patient had a PCN reaction that required hospitalization: No Has patient had a PCN reaction occurring within the last 10 years: No If all of the above answers are "NO", then may proceed with Cephalosporin use.     Patient Measurements: Height: 5\' 9"  (175.3 cm) Weight: 134 lb 7.7 oz (61 kg) IBW/kg (Calculated) : 70.7 Heparin Dosing Weight: actual body weight  Vital Signs: Temp: 98 F (36.7 C) (02/08 1340) Temp Source: Oral (02/08 1340) BP: 102/70 (02/08 1340) Pulse Rate: 53 (02/08 1340)  Labs: Recent Labs    01/26/20 1840 01/26/20 2029 01/27/20 0509  HGB  --  20.4* 15.3  HCT  --  58.4* 43.6  PLT  --  332 214  CREATININE  --  2.45* 1.90*  CKTOTAL 32*  --   --   CKMB 0.4*  --   --     Estimated Creatinine Clearance: 45 mL/min (A) (by C-G formula based on SCr of 1.9 mg/dL (H)).   Medical History: Past Medical History:  Diagnosis Date  . Abdominal pain   . Mesenteric vein thrombosis (H[facility])    Dec 2020 in setting of pancreatitis  . Nausea & vomiting   . Pancreatitis     Medications:  PTA Eliquis 5mg  BID - LD 01/27/20 @ 05:47  Assessment:  29yr male admitted with nausea/vomiting/abdominal pain;acute pancreatitis  PMH significant for alcoholic pancreatitis and portal vein thrombosis (on Eliquis PTA)  Patient currently NPO and awaiting evaluation by GI  Eliquis on hold and pharmacy consulted to dose IV heparin gtt  Eliquis can falsely elevate heparin level, so will monitor IV heparin with aPTT until effects of Eliquis have worn off  Goal of Therapy:  Heparin level  0.3-0.7 units/ml aPTT 66-102 seconds Monitor platelets by anticoagulation protocol: Yes   Plan:   Obtain baseline aPTT and heparin level  Begin IV heparin @ 1000 units/hr  Check aPTT and heparin level 6 hr after IV heparin gtt started  Monitor aPTT until aPTT and heparin level correlate  Daily CBC and heparin level while on IV heparin gtt  Berlynn Warsame Trefz 01/27/2020,6:48 PM

## 2020-01-27 NOTE — Progress Notes (Signed)
I responded for follow up with AD for pt who was not available earlier in the day. Piper was resting. He said he recently received meds for his pain and was open to talk. He said he did request info about an AD. I returned with a copy and gave him one. He said he wanted to talk it over with his mom who he says will be his decision maker. Also, he said he plans to take it to his bank for [employer]. I offered him space to voice concerns. He said he was frustrated because he feels like he is very sick and no one knows what really wrong with him. He stated he does not do any kind of drugs of any. He lives with him mother, who visited earlier in the day.  Austan said he does not have any faith or religious practice. However, he said he enjoyed the Chaplain presence and requested a follow up when available. I will follow up with Arlys John at a later time.  Chaplain resident  Orest Dikes [phone]

## 2020-01-27 NOTE — Progress Notes (Signed)
Patient's belongings include: Black back pack, containing change of clothes, and shave bag Tablet Phone  Chargers for tablet and phone Reading glasses Pillow from home Sneakers [employer]

## 2020-01-27 NOTE — Consult Note (Addendum)
Consultation  Referring Provider:  Dr. Tyrell Antonio    Primary Care Physician:  Kerin Perna, NP Primary Gastroenterologist: Dr. Loletha Carrow        Reason for Consultation: Gastric Outlet Obstruction             HPI:   Paul Allen is a 39 y.o. male with a past medical history of alcoholic pancreatitis, portal venous thrombosis, as well as others, who presented to the ER yesterday with abdominal pain since 01/23/2020.    Today, the patient tells me that he feels like his body has been "out of whack" since December of last year.  Every time that he gets out of the hospital he ends up going back in.  Describes that when they discharged him recently on 01/10/2020 he vomited all weekend.  When this happens he goes back to clear liquid diet and then gradually builds himself back up.  Earlier last week he was actually feeling good enough to go to the gym and was eating fine, but then on 01/23/2020 he started with abdominal pain "all over" and nausea with vomiting of anything that he put in his mouth.  This continued over the weekend until he presented to the hospital yesterday.  Tells me that he still cannot keep anything down, everything seems to "reach a point" and then comes back up.  Describes his weight loss of 50 pounds since Christmas of last year.  Has not had a bowel movement since 2/5 but tells me he is passing gas.    Remains nauseous now.    Denies fever or chills.  ED course: CT abdomen pelvis with marked severe gastric distention of uncertain etiology, stable pancreatic duct stent positioning and sigmoid diverticulosis; lipase 97 (previously 6), creatinine 2.45, AST 37, ALT 49, alk phos 200, T bili 1.6, ALT WBC 12.7  GI history: 01/07/2020 hospitalization: For pancreatitis, CT had showed persistent occlusive left portal vein thrombosis with interval enlargement of numerous.  Portal venous collaterals, heterogenous central liver enhancement margin suspect due to altered perfusion, mild  diffuse intrahepatic biliary ductal dilation with normal caliber CBD, otherwise improved findings of pancreatitis, peripancreatic fluid collections and ascites have resolved, well-positioned pancreatic duct stent, apparent increased smooth circumferential wall thickening in the gastric antrum, nonspecific, aortic atherosclerosis; at that time we okayed him for hospital discharge on 1/22 01/02/2020 office visit for alcohol induced pancreatitis: At that time described upper abdominal pain which have been decreasing, bowel habits regular and formed, describe not drinking since October 2020, taking his Eliquis regularly; plan: CT scan abdomen and pelvis with contrast, he was prescribed tramadol 12/02/2019 hospital admission: Imaging with pancreatic duct disruption as well as SMV thrombosis, put on octreotide, antibiotics and underwent ERCP attempt on 12/09/2019 by Dr. Davonna Belling was so inflamed the papilla could not be located, repeat ERCP by Dr. Stefani Dama Oddy on 12/11/2019 was able to cannulate the ventral pancreatic duct, sphincterotomy was performed and 5 French, 7 cm plastic stent placed 11/28/2019 office visit just there for alcohol related pancreatitis: Known pancreatic cysts  Past Medical History:  Diagnosis Date  . Abdominal pain   . Mesenteric vein thrombosis ([place])    Dec 2020 in setting of pancreatitis  . Nausea & vomiting   . Pancreatitis     Past Surgical History:  Procedure Laterality Date  . APPENDECTOMY    . ERCP N/A 12/06/2019   Procedure: ENDOSCOPIC RETROGRADE CHOLANGIOPANCREATOGRAPHY (ERCP);  Surgeon: Milus Banister, MD;  Location: [facility];  Service: Endoscopy;  Laterality: N/A;  Aborted Procedure  . ERCP N/A 12/11/2019   Procedure: ENDOSCOPIC RETROGRADE CHOLANGIOPANCREATOGRAPHY (ERCP);  Surgeon: Irving Copas., MD;  Location: [facility];  Service: Gastroenterology;  Laterality: N/A;  . PANCREATIC STENT PLACEMENT  12/11/2019   Procedure: PANCREATIC STENT  PLACEMENT;  Surgeon: Irving Copas., MD;  Location: [facility];  Service: Gastroenterology;;  . Joan Mayans  12/11/2019   Procedure: Joan Mayans;  Surgeon: Rush Landmark Telford Nab., MD;  Location: [facility];  Service: Gastroenterology;;    Family History  Problem Relation Age of Onset  . Cholecystitis Mother   . Arthritis Mother   . Stomach cancer Father     Social History   Tobacco Use  . Smoking status: Current Every Day Smoker    Packs/day: 1.50    Types: Cigarettes  . Smokeless tobacco: Never Used  Substance Use Topics  . Alcohol use: Not Currently    Comment: hasn't drank in 4 weeks, but before would drink about 5 malt beers a day  . Drug use: Yes    Frequency: 7.0 times per week    Types: Marijuana    Prior to Admission medications   Medication Sig Start Date End Date Taking? Authorizing Provider  apixaban (ELIQUIS) 5 MG TABS tablet Take 10 mg (2 tabs) twice daily for 1 week, then take 5 mg (1 tab) twice daily for 3 months then stop Patient taking differently: Take 5 mg by mouth 2 (two) times daily.  12/15/19  Yes Danford, Suann Larry, MD  folic acid (FOLVITE) 1 MG tablet Take 1 tablet (1 mg total) by mouth daily. 12/15/19  Yes Danford, Suann Larry, MD  pantoprazole (PROTONIX) 40 MG tablet Take 1 tablet (40 mg total) by mouth daily at 6 (six) AM. 12/16/19  Yes Danford, Suann Larry, MD  thiamine 100 MG tablet Take 1 tablet (100 mg total) by mouth daily. 12/15/19  Yes Danford, Suann Larry, MD  ondansetron (ZOFRAN-ODT) 4 MG disintegrating tablet Take 1 tablet (4 mg total) by mouth every 8 (eight) hours as needed. Patient not taking: Reported on 01/26/2020 01/10/20   Alma Friendly, MD    Current Facility-Administered Medications  Medication Dose Route Frequency Provider Last Rate Last Admin  . 0.9 % NaCl with KCl 20 mEq/ L  infusion   Intravenous Continuous Jani Gravel, MD 150 mL/hr at 01/27/20 0329 New Bag at 01/27/20 0329  . folic acid  (FOLVITE) tablet 1 mg  1 mg Oral Daily Jani Gravel, MD      . HYDROmorphone (DILAUDID) injection 0.5 mg  0.5 mg Intravenous Q4H PRN Jani Gravel, MD   0.5 mg at 01/27/20 3291  . ondansetron (ZOFRAN) injection 4 mg  4 mg Intravenous Q6H PRN Jani Gravel, MD   4 mg at 01/27/20 0142  . pantoprazole (PROTONIX) injection 40 mg  40 mg Intravenous Q12H Jani Gravel, MD   40 mg at 01/27/20 0327  . potassium chloride 10 mEq in 100 mL IVPB  10 mEq Intravenous Q1 Hr x 4 Blount, Xenia T, NP 100 mL/hr at 01/27/20 0755 10 mEq at 01/27/20 0755  . promethazine (PHENERGAN) injection 12.5 mg  12.5 mg Intravenous Q6H PRN Jani Gravel, MD   12.5 mg at 01/27/20 0340  . thiamine tablet 100 mg  100 mg Oral Daily Jani Gravel, MD        Allergies as of 01/26/2020 - Review Complete 01/26/2020  Allergen Reaction Noted  . Penicillins Rash 05/13/2018     Review of Systems:    Constitutional: No  fever or chills Skin: No rash Cardiovascular: No chest pain  Respiratory: No SOB  Gastrointestinal: See HPI and otherwise negative Genitourinary: No dysuria  Neurological: No headache, dizziness or syncope Musculoskeletal: No new muscle or joint pain Hematologic: No bleeding  Psychiatric: No history of depression or anxiety    Physical Exam:  Vital signs in last 24 hours: Temp:  [97.6 F (36.4 C)-98.2 F (36.8 C)] 97.6 F (36.4 C) (02/08 0456) Pulse Rate:  [59-104] 59 (02/08 0456) Resp:  [6-20] 18 (02/08 0456) BP: (115-148)/(78-114) 122/78 (02/08 0456) SpO2:  [92 %-100 %] 100 % (02/08 0456) Weight:  [57.8 kg-61 kg] 61 kg (02/08 0221) Last BM Date: 01/25/20 General:   Pleasant Caucasian male appears to be in NAD, Well developed, Well nourished, alert and cooperative Head:  Normocephalic and atraumatic. Eyes:   PEERL, EOMI. No icterus. Conjunctiva pink. Ears:  Normal auditory acuity. Neck:  Supple Throat: Oral cavity and pharynx without inflammation, swelling or lesion.  Lungs: Respirations even and unlabored. Lungs clear  to auscultation bilaterally.   No wheezes, crackles, or rhonchi.  Heart: Normal S1, S2. No MRG. Regular rate and rhythm. No peripheral edema, cyanosis or pallor.  Abdomen:  Soft, nondistended,moderate generalized ttp, No rebound or guarding. High pitched bowel sounds over epigastrum. No appreciable masses or hepatomegaly. Rectal:  Not performed.  Msk:  Symmetrical without gross deformities. Peripheral pulses intact.  Extremities:  Without edema, no deformity or joint abnormality.  Neurologic:  Alert and  oriented x4;  grossly normal neurologically.  Skin:   Dry and intact without significant lesions or rashes. Psychiatric: Demonstrates good judgement and reason without abnormal affect or behaviors.   LAB RESULTS: Recent Labs    01/26/20 2029 01/27/20 0509  WBC 12.7* 10.0  HGB 20.4* 15.3  HCT 58.4* 43.6  PLT 332 214   BMET Recent Labs    01/26/20 2029 01/27/20 0509  NA 134* 134*  K 3.1* 2.7*  CL 70* 85*  CO2 36* 34*  GLUCOSE 183* 101*  BUN 50* 54*  CREATININE 2.45* 1.90*  CALCIUM 10.1 8.2*   LFT Recent Labs    01/27/20 0509  PROT 6.6  ALBUMIN 3.9  AST 23  ALT 30  ALKPHOS 117  BILITOT 0.9   STUDIES: CT ABDOMEN PELVIS WO CONTRAST  Result Date: 01/26/2020 CLINICAL DATA:  Nausea and vomiting. EXAM: CT ABDOMEN AND PELVIS WITHOUT CONTRAST TECHNIQUE: Multidetector CT imaging of the abdomen and pelvis was performed following the standard protocol without IV contrast. COMPARISON:  January 06, 2020 FINDINGS: Lower chest: No acute abnormality. Hepatobiliary: No focal liver abnormality is seen. The areas of heterogeneous enhancement seen throughout the liver on the prior contrast enhanced CT are not clearly identified on this noncontrast enhanced study P the gallbladder is moderately distended without evidence of gallstones, gallbladder wall thickening, or biliary dilatation. Pancreas: The pancreatic duct stent is again seen and unchanged in position. The pancreatic parenchyma is  normal in appearance. Spleen: Normal in size without focal abnormality. Adrenals/Urinary Tract: Adrenal glands are unremarkable. Kidneys are normal, without renal calculi, focal lesion, or hydronephrosis. Bladder is unremarkable. Stomach/Bowel: There is marked severity gastric distension. The visualized loops of large and small bowel are normal in caliber. The appendix is surgically absent. No evidence of bowel wall thickening, distention, or inflammatory changes. Noninflamed diverticula are seen throughout the sigmoid colon. Vascular/Lymphatic: There is moderate severity aortic atherosclerosis. No enlarged abdominal or pelvic lymph nodes. Reproductive: Prostate is unremarkable. Other: No abdominal wall hernia or abnormality. No abdominopelvic  ascites. Musculoskeletal: No acute or significant osseous findings. IMPRESSION: 1. Marked severity gastric distension, of uncertain etiology. Sequelae associated with gastric outlet obstruction cannot be excluded. 2. Stable pancreatic duct stent positioning. 3. Sigmoid diverticulosis. Aortic Atherosclerosis (ICD10-I70.0). Electronically Signed   By: Virgina Norfolk M.D.   On: 01/26/2020 22:49    Impression / Plan:   Impression: 1.  Nausea and vomiting: Likely related to gastric outlet obstruction as seen on CT above 2.  Abdominal pain: Likely due to above 3.  Abnormal LFTs 4.  History of portal vein thrombosis: On Eliquis 5.  AKF 6.  Hypokalemia  Plan: 1.  Will discuss case with Dr. Loletha Carrow.  Patient would likely benefit from NG tube with low intermittent suction. Ordered now. 2.  Patient to remain n.p.o. for now. 3.  Continue Eliquis 4.  Please await any further recommendations from Dr. Loletha Carrow later today.  Thank you for your kind consultation, we will continue to follow.  Lavone Nian [facility]  01/27/2020, 8:54 AM   I have reviewed the entire case in detail with the above APP and discussed the plan in detail.  Therefore, I agree with the diagnoses  recorded above. In addition,  I have personally interviewed and examined the patient and have personally reviewed any abdominal/pelvic CT scan images (this admission and last).  My additional thoughts are as follows:  Clinically and radiographically, he has a gastric outlet obstruction (incidentally, he has a succussion splash on exam).  Although much of the sequela of his initial severe pancreatitis has significantly subsided, I suspect he still has significant edema adjacent to the pancreatic head and perhaps causing reactive inflammation of the duodenum with gastric outlet obstruction.  He also has chronic superior mesenteric vein thrombosis most recently seen on the CT scan of January 18, not described on yesterday CT scan.  However, he still requires anticoagulation.  Nursing was unable to pass an NG tube successfully, so we have arranged placement of this in radiology under fluoroscopy.  After displaced, his NG tube should be placed to low intermittent suction.  After decompression of the stomach, he will need an upper endoscopy prior to discharge.  All of this was explained to him, and he was agreeable to the plan.  Please stop his oral anticoagulation for now because further interventions are planned.  IV heparin can be started anytime, as it can be stopped shortly before procedures.    Nelida Meuse III Office:[phone]

## 2020-01-27 NOTE — Progress Notes (Addendum)
PROGRESS NOTE    Paul Allen  PJK:[id number] DOB: 1981-04-11 DOA: 01/26/2020 PCP: Kerin Perna, NP   Brief Narrative: 39 year old with past medical history significant for alcoholic pancreatitis, portal vein thrombosis on Eliquis who present with nausea vomiting and generalized abdominal pain since Thursday night.  Patient denies using alcohol for months.  He was trying to stick to liquids but his pain got progressively worse. CT showed marked severity gastric distention, of uncertain etiology.  Sequela associated with gastric outlet obstruction could not be excluded.  Stable pancreatic duct stent positioning.  Sigmoid diverticulosis. Patient is at 62, creatinine 2.4, BUN 50 AST 37, ALT 49 alkaline phosphatase 200, hemoglobin 20.  Patient received IV fluids and IV morphine in the ED admitted for further evaluation.   Assessment & Plan:   Principal Problem:   Nausea & vomiting Active Problems:   Hypokalemia   Pancreatitis, acute   Elevated LFTs   Elevated hemoglobin (HCC)   ARF (acute renal failure) (HCC)   1-Nausea vomiting abdominal pain: CT scan with gastric distention concern with gastric outlet obstruction Continue with n.p.o. IV Protonix twice daily. NG tube to be placed. GI consulted. As needed Zofran and Phenergan.  2-Acute pancreatitis: Patient with abdominal pain, mild elevation of lipase at 96. Continue with n.p.o. Pain medication. IV fluids.  Transaminases: Acute hepatitis panel. negative GI consulted. Normalized.  Portal vein thrombosis: Holding Eliquis due to n.p.o. status and awaiting further evaluation by GI. Resume anticoagulation per GI recommendation. Per gi note, can start IV heparin anytime. Hold oral eliquis. Will start heparin per pharmacy   Acute renal failure: Related to hypovolemia. Present with a creatinine at 2.4. Creatinine trending down 1.9 with IV fluids.  Severe hypokalemia: Getting potassium with IV fluids and KCl IV  rounds. Check magnesium level.       Estimated body mass index is 19.86 kg/m as calculated from the following:   Height as of this encounter: 5\' 9"  (1.753 m).   Weight as of this encounter: 61 kg.   DVT prophylaxis: SCDs Code Status: Full code Family Communication: Care discussed with patient Disposition Plan:  Patient is from: Home Anticipated d/c date: 3 4 days Barriers to d/c or necessity for inpatient status: Patient will require NG tube for suctioning, gastric distention.  Need IV fluids for AKI.  Consultants:   GI  Procedures:   None  Antimicrobials:    Subjective: He is alert he is complaining of abdominal pain, feels reflux, nausea.   Objective: Vitals:   01/27/20 0115 01/27/20 0221 01/27/20 0222 01/27/20 0456  BP: 119/79  126/90 122/78  Pulse: 61  60 (!) 59  Resp: 18  18 18   Temp:   97.8 F (36.6 C) 97.6 F (36.4 C)  TempSrc:   Oral Oral  SpO2: 96%  100% 100%  Weight:  61 kg    Height:  5\' 9"  (1.753 m)      Intake/Output Summary (Last 24 hours) at 01/27/2020 1244 Last data filed at 01/27/2020 1231 Gross per 24 hour  Intake 3312.48 ml  Output 670 ml  Net 2642.48 ml   Filed Weights   01/26/20 1943 01/27/20 0221  Weight: 57.8 kg 61 kg    Examination:  General exam: Appears calm and comfortable  Respiratory system: Clear to auscultation. Respiratory effort normal. Cardiovascular system: S1 & S2 heard, RRR. No JVD, murmurs, rubs, gallops or clicks. No pedal edema. Gastrointestinal system: Abdomen is mild distended, soft and tender.  Central nervous system: Alert and oriented.  No focal neurological deficits. Extremities: Symmetric 5 x 5 power. Skin: No rashes, lesions or ulcers Psychiatry: Judgement and insight appear normal. Mood & affect appropriate.     Data Reviewed: I have personally reviewed following labs and imaging studies  CBC: Recent Labs  Lab 01/26/20 2029 01/27/20 0509  WBC 12.7* 10.0  HGB 20.4* 15.3  HCT 58.4* 43.6  MCV  89.0 90.1  PLT 332 214   Basic Metabolic Panel: Recent Labs  Lab 01/26/20 2029 01/27/20 0509  NA 134* 134*  K 3.1* 2.7*  CL 70* 85*  CO2 36* 34*  GLUCOSE 183* 101*  BUN 50* 54*  CREATININE 2.45* 1.90*  CALCIUM 10.1 8.2*   GFR: Estimated Creatinine Clearance: 45 mL/min (A) (by C-G formula based on SCr of 1.9 mg/dL (H)). Liver Function Tests: Recent Labs  Lab 01/26/20 2029 01/27/20 0509  AST 37 23  ALT 49* 30  ALKPHOS 200* 117  BILITOT 1.6* 0.9  PROT 10.3* 6.6  ALBUMIN 5.5* 3.9   Recent Labs  Lab 01/26/20 2029 01/27/20 0509  LIPASE 97* 72*   No results for input(s): AMMONIA in the last 168 hours. Coagulation Profile: No results for input(s): INR, PROTIME in the last 168 hours. Cardiac Enzymes: Recent Labs  Lab 01/26/20 1840  CKTOTAL 32*  CKMB 0.4*   BNP (last 3 results) No results for input(s): PROBNP in the last 8760 hours. HbA1C: No results for input(s): HGBA1C in the last 72 hours. CBG: No results for input(s): GLUCAP in the last 168 hours. Lipid Profile: No results for input(s): CHOL, HDL, LDLCALC, TRIG, CHOLHDL, LDLDIRECT in the last 72 hours. Thyroid Function Tests: No results for input(s): TSH, T4TOTAL, FREET4, T3FREE, THYROIDAB in the last 72 hours. Anemia Panel: No results for input(s): VITAMINB12, FOLATE, FERRITIN, TIBC, IRON, RETICCTPCT in the last 72 hours. Sepsis Labs: No results for input(s): PROCALCITON, LATICACIDVEN in the last 168 hours.  Recent Results (from the past 240 hour(s))  SARS CORONAVIRUS 2 (TAT 6-24 HRS) Nasopharyngeal Nasopharyngeal Swab     Status: None   Collection Time: 01/26/20 10:11 PM   Specimen: Nasopharyngeal Swab  Result Value Ref Range Status   SARS Coronavirus 2 NEGATIVE NEGATIVE Final    Comment: (NOTE) SARS-CoV-2 target nucleic acids are NOT DETECTED. The SARS-CoV-2 RNA is generally detectable in upper and lower respiratory specimens during the acute phase of infection. Negative results do not preclude  SARS-CoV-2 infection, do not rule out co-infections with other pathogens, and should not be used as the sole basis for treatment or other patient management decisions. Negative results must be combined with clinical observations, patient history, and epidemiological information. The expected result is Negative. Fact Sheet for Patients: HairSlick.no Fact Sheet for Healthcare Providers: quierodirigir.com This test is not yet approved or cleared by the [place] FDA and  has been authorized for detection and/or diagnosis of SARS-CoV-2 by FDA under an Emergency Use Authorization (EUA). This EUA will remain  in effect (meaning this test can be used) for the duration of the COVID-19 declaration under Section 56 4(b)(1) of the Act, 21 U.S.C. section 360bbb-3(b)(1), unless the authorization is terminated or revoked sooner. Performed at [facility], [address], [place], [place] [address]          Radiology Studies: CT ABDOMEN PELVIS WO CONTRAST  Result Date: 01/26/2020 CLINICAL DATA:  Nausea and vomiting. EXAM: CT ABDOMEN AND PELVIS WITHOUT CONTRAST TECHNIQUE: Multidetector CT imaging of the abdomen and pelvis was performed following the standard protocol without  IV contrast. COMPARISON:  January 06, 2020 FINDINGS: Lower chest: No acute abnormality. Hepatobiliary: No focal liver abnormality is seen. The areas of heterogeneous enhancement seen throughout the liver on the prior contrast enhanced CT are not clearly identified on this noncontrast enhanced study P the gallbladder is moderately distended without evidence of gallstones, gallbladder wall thickening, or biliary dilatation. Pancreas: The pancreatic duct stent is again seen and unchanged in position. The pancreatic parenchyma is normal in appearance. Spleen: Normal in size without focal abnormality. Adrenals/Urinary Tract: Adrenal glands are unremarkable. Kidneys are normal,  without renal calculi, focal lesion, or hydronephrosis. Bladder is unremarkable. Stomach/Bowel: There is marked severity gastric distension. The visualized loops of large and small bowel are normal in caliber. The appendix is surgically absent. No evidence of bowel wall thickening, distention, or inflammatory changes. Noninflamed diverticula are seen throughout the sigmoid colon. Vascular/Lymphatic: There is moderate severity aortic atherosclerosis. No enlarged abdominal or pelvic lymph nodes. Reproductive: Prostate is unremarkable. Other: No abdominal wall hernia or abnormality. No abdominopelvic ascites. Musculoskeletal: No acute or significant osseous findings. IMPRESSION: 1. Marked severity gastric distension, of uncertain etiology. Sequelae associated with gastric outlet obstruction cannot be excluded. 2. Stable pancreatic duct stent positioning. 3. Sigmoid diverticulosis. Aortic Atherosclerosis (ICD10-I70.0). Electronically Signed   By: Aram Candela M.D.   On: 01/26/2020 22:49        Scheduled Meds: . folic acid  1 mg Oral Daily  . LORazepam  1 mg Intravenous Once  . pantoprazole (PROTONIX) IV  40 mg Intravenous Q12H  . thiamine  100 mg Oral Daily   Continuous Infusions: . potassium chloride 10 mEq (01/27/20 1202)     LOS: 0 days    Time spent: 35 minutes.     Alba Cory, MD [facility]   If 7PM-7AM, please contact night-coverage [email or web address]  01/27/2020, 12:44 PM

## 2020-01-27 NOTE — Progress Notes (Signed)
Initial Nutrition Assessment  RD working remotely.   DOCUMENTATION CODES:   Not applicable  INTERVENTION:  - diet advancement as medically feasible.   NUTRITION DIAGNOSIS:   Inadequate oral intake related to inability to eat as evidenced by NPO status.  GOAL:   Patient will meet greater than or equal to 90% of their needs  MONITOR:   Diet advancement, Labs, Weight trends  REASON FOR ASSESSMENT:   Malnutrition Screening Tool  ASSESSMENT:   39 year old male with medical history of alcoholic pancreatitis and portal vein thrombosis on Eliquis. He presented to the ED with N/V and abdominal pain since 2/4. He denies drinking alcohol for several months. CT showed severe gastric distention with unclear etiology, stable pancreatic stent positioning, and sigmoid diverticulosis.  Patient has been NPO since admission. Per chart review, weight today is 134 lb and weight on 01/02/20 was 154 lb. This indicates 20 lb weight loss (13% body weight) in the past 3 weeks; significant for time frame. Will need to closely monitor weight trends.   Per notes: - N/V and abdominal pain--CT concerning for GOO; NGT to be placed but several attempts at bedside have been unsuccessful - acute pancreatitis - transaminitis  ARF - severe hypokalemia--plan to check serum Mg   Labs reviewed; Na: 134 mmol/l, K: 2.7 mmol/l, Cl: 85 mmol/l, BUN: 54 mg/dl, creatinine: 1.9 mg/dl, Ca: 8.2 mg/dl, lipase: 72 u/l, GFR: 43 mg/dl. Medications reviewed; 1 mg folvite/day, 10 mEq IV KCl x4 runs 2/8, 100 mg oral thiamine/day.  IVF; LR-20 mEq KCl @ 125 ml/hr.     NUTRITION - FOCUSED PHYSICAL EXAM:  unable to complete at this time.   Diet Order:   Diet Order            Diet NPO time specified Except for: Sips with Meds, Ice Chips  Diet effective now              EDUCATION NEEDS:   No education needs have been identified at this time  Skin:  Skin Assessment: Reviewed RN Assessment  Last BM:   2/6  Height:   Ht Readings from Last 1 Encounters:  01/27/20 5\' 9"  (1.753 m)    Weight:   Wt Readings from Last 1 Encounters:  01/27/20 61 kg    Ideal Body Weight:  72.7 kg  BMI:  Body mass index is 19.86 kg/m.  Estimated Nutritional Needs:   Kcal:  2135-2400 kcal  Protein:  105-120 grams  Fluid:  >/= 2.2 L/day     01-17-1983, MS, RD, LDN, CNSC Inpatient Clinical Dietitian RD pager # available in AMION  After hours/weekend pager # available in [facility]

## 2020-01-27 NOTE — Progress Notes (Signed)
ANTICOAGULATION CONSULT NOTE - Initial Consult  Pharmacy Consult for apixaban Indication: portal vein thrombosis  Allergies  Allergen Reactions  . Penicillins Rash    Has patient had a PCN reaction causing immediate rash, facial/tongue/throat swelling, SOB or lightheadedness with hypotension: yes Has patient had a PCN reaction causing severe rash involving mucus membranes or skin necrosis: Yes Has patient had a PCN reaction that required hospitalization: No Has patient had a PCN reaction occurring within the last 10 years: No If all of the above answers are "NO", then may proceed with Cephalosporin use.     Patient Measurements: Height: 5\' 9"  (175.3 cm) Weight: 127 lb 6.4 oz (57.8 kg) IBW/kg (Calculated) : 70.7   Vital Signs: Temp: 98.2 F (36.8 C) (02/07 1943) Temp Source: Oral (02/07 1943) BP: 124/82 (02/07 2245) Pulse Rate: 66 (02/07 2245)  Labs: Recent Labs    01/26/20 2029  HGB 20.4*  HCT 58.4*  PLT 332  CREATININE 2.45*    Estimated Creatinine Clearance: 33.1 mL/min (A) (by C-G formula based on SCr of 2.45 mg/dL (H)).   Medical History: Past Medical History:  Diagnosis Date  . Abdominal pain   . Mesenteric vein thrombosis (HCC)    Dec 2020 in setting of pancreatitis  . Nausea & vomiting   . Pancreatitis     Medications:  Scheduled:  . apixaban  5 mg Oral BID  . pantoprazole (PROTONIX) IV  40 mg Intravenous Q12H  . sodium chloride flush  3 mL Intravenous Once   Infusions:  . 0.9 % NaCl with KCl 20 mEq / L    . sodium chloride      Assessment: 38 yoM admitting with n/v and abdominal pain on chronic apixaban for portal vein thrombosis.     Plan:  apixaban 5 mg bid as on prior to admission  Jan 2021 01/27/2020,12:38 AM

## 2020-01-27 NOTE — Progress Notes (Signed)
Several unsuccessful attempts at placing NGT made by this RN and charge RN.  Unable to guide tube.  Heron Nay, PA made aware - GI MD to round this afternoon.  Patient is not having any N/V at this time.

## 2020-01-28 LAB — MAGNESIUM: Magnesium: 1.9 mg/dL (ref 1.7–2.4)

## 2020-01-28 LAB — COMPREHENSIVE METABOLIC PANEL
ALT: 32 U/L (ref 0–44)
AST: 29 U/L (ref 15–41)
Albumin: 3.7 g/dL (ref 3.5–5.0)
Alkaline Phosphatase: 108 U/L (ref 38–126)
Anion gap: 11 (ref 5–15)
BUN: 37 mg/dL — ABNORMAL HIGH (ref 6–20)
CO2: 31 mmol/L (ref 22–32)
Calcium: 8.8 mg/dL — ABNORMAL LOW (ref 8.9–10.3)
Chloride: 92 mmol/L — ABNORMAL LOW (ref 98–111)
Creatinine, Ser: 1.39 mg/dL — ABNORMAL HIGH (ref 0.61–1.24)
GFR calc Af Amer: >60 mL/min (ref >60)
GFR calc non Af Amer: >60 mL/min (ref >60)
Glucose, Bld: 83 mg/dL (ref 70–99)
Potassium: 3.3 mmol/L — ABNORMAL LOW (ref 3.5–5.1)
Sodium: 134 mmol/L — ABNORMAL LOW (ref 135–145)
Total Bilirubin: 1.4 mg/dL — ABNORMAL HIGH (ref 0.3–1.2)
Total Protein: 6.4 g/dL — ABNORMAL LOW (ref 6.5–8.1)

## 2020-01-28 LAB — CBC
HCT: 44.3 % (ref 39.0–52.0)
Hemoglobin: 14.6 g/dL (ref 13.0–17.0)
MCH: 30.6 pg (ref 26.0–34.0)
MCHC: 33 g/dL (ref 30.0–36.0)
MCV: 92.9 fL (ref 80.0–100.0)
Platelets: 167 10*3/uL (ref 150–400)
RBC: 4.77 MIL/uL (ref 4.22–5.81)
RDW: 13.5 % (ref 11.5–15.5)
WBC: 8.4 10*3/uL (ref 4.0–10.5)
nRBC: 0 % (ref 0.0–0.2)

## 2020-01-28 LAB — APTT
aPTT: 85 seconds — ABNORMAL HIGH (ref 24–36)
aPTT: 155 seconds — ABNORMAL HIGH (ref 24–36)
aPTT: 51 seconds — ABNORMAL HIGH (ref 24–36)

## 2020-01-28 LAB — HEPARIN LEVEL (UNFRACTIONATED): Heparin Unfractionated: 1.68 IU/mL — ABNORMAL HIGH (ref 0.30–0.70)

## 2020-01-28 MED ORDER — HEPARIN (PORCINE) 25000 UT/250ML-% IV SOLN
950.0000 [IU]/h | INTRAVENOUS | Status: DC
  Administered 2020-01-29: 950 [IU]/h via INTRAVENOUS
  Filled 2020-01-28: qty 250

## 2020-01-28 MED ORDER — POTASSIUM CHLORIDE 10 MEQ/100ML IV SOLN
10.0000 meq | INTRAVENOUS | Status: AC
  Administered 2020-01-28 (×2): 10 meq via INTRAVENOUS
  Filled 2020-01-28 (×2): qty 100

## 2020-01-28 MED ORDER — PHENOL 1.4 % MT LIQD
1.0000 | OROMUCOSAL | Status: DC | PRN
  Filled 2020-01-28: qty 177

## 2020-01-28 MED ORDER — HEPARIN (PORCINE) 25000 UT/250ML-% IV SOLN: 800.0000 [IU]/h | INTRAVENOUS | Status: DC

## 2020-01-28 MED ORDER — HEPARIN (PORCINE) 25000 UT/250ML-% IV SOLN: 1000.0000 [IU]/h | INTRAVENOUS | Status: DC

## 2020-01-28 NOTE — Progress Notes (Signed)
ANTICOAGULATION CONSULT NOTE - Initial Consult  Pharmacy Consult for Heparin Indication: Portal vein thrombosis  Allergies  Allergen Reactions  . Penicillins Rash    Has patient had a PCN reaction causing immediate rash, facial/tongue/throat swelling, SOB or lightheadedness with hypotension: yes Has patient had a PCN reaction causing severe rash involving mucus membranes or skin necrosis: Yes Has patient had a PCN reaction that required hospitalization: No Has patient had a PCN reaction occurring within the last 10 years: No If all of the above answers are "NO", then may proceed with Cephalosporin use.     Patient Measurements: Height: 5\' 9"  (175.3 cm) Weight: 137 lb 11.2 oz (62.5 kg) IBW/kg (Calculated) : 70.7 Heparin Dosing Weight: n/a. Use TBW = 62 kg  Vital Signs: Temp: 97.4 F (36.3 C) (02/09 0556) Temp Source: Oral (02/09 0556) BP: 100/61 (02/09 0556) Pulse Rate: 61 (02/09 0556)  Labs: Recent Labs    01/26/20 1840 01/26/20 2029 01/26/20 2029 01/27/20 0509 01/27/20 1926 01/28/20 0145 01/28/20 1048  HGB  --  20.4*   < > 15.3  --  14.6  --   HCT  --  58.4*  --  43.6  --  44.3  --   PLT  --  332  --  214  --  167  --   APTT  --   --   --   --  32 85* 155*  HEPARINUNFRC  --   --   --   --  1.48* 1.68*  --   CREATININE  --  2.45*  --  1.90*  --  1.39*  --   CKTOTAL 32*  --   --   --   --   --   --   CKMB 0.4*  --   --   --   --   --   --    < > = values in this interval not displayed.    Estimated Creatinine Clearance: 63.1 mL/min (A) (by C-G formula based on SCr of 1.39 mg/dL (H)).   Medical History: Past Medical History:  Diagnosis Date  . Abdominal pain   . Mesenteric vein thrombosis (HCC)    Dec 2020 in setting of pancreatitis  . Nausea & vomiting   . Pancreatitis     Medications:  PTA Eliquis 5mg  BID - LD 01/27/20 @ 05:47  Assessment: 35yr male admitted with nausea/vomiting/abdominal pain;acute pancreatitis. PMH significant for alcoholic  pancreatitis and portal vein thrombosis (on Eliquis PTA). Pt currently NPO, GI consulted. Eliquis being held, pharmacy consulted to dose/monitor IV heparin.  Baseline labs: -CBC: Hgb 15.3, Plt 214 -HL 1.48, falsely elevated due to Eliquis  Today, 01/28/20  APTT = 155 seconds is supratherapeutic on heparin infusion of 1000 units/hr.   Confirmed with RN that heparin infusing at correct rate. No signs or symptoms of bleeding.  CBC: Hgb and Plt - WNL  Goal of Therapy:  Heparin level 0.3-0.7 units/ml aPTT 66-102 seconds Monitor platelets by anticoagulation protocol: Yes   Plan:   Hold heparin infusion for 1 hour  Decrease heparin infusion to 800 units/hr  Check 6 hour aPTT   Monitor aPTT until aPTT and heparin level correlate. Once correlating, can monitor using HL only.  Daily CBC and heparin level while on IV heparin gtt  Follow along for ability to resume PO anticoagulation  GI planning to perform EGD tomorrow, heparin drip to stop at 0630 on 2/10 per GI note.   03/27/20, PharmD 01/28/2020,11:57 AM

## 2020-01-28 NOTE — Progress Notes (Addendum)
Progress Note   Subjective  Chief Complaint: Gastric outlet obstruction, history of complicated alcoholic pancreatitis with portal vein thrombosis  Today, the patient tells me that he feels some better, his pain level has decreased, he finds the NG tube uncomfortable but is happy to have it in there to help him get better.  He is aware of plans for likely EGD tomorrow, denies any new complaints or concerns.   Objective   Vital signs in last 24 hours: Temp:  [97.4 F (36.3 C)-98.1 F (36.7 C)] 97.4 F (36.3 C) (02/09 0556) Pulse Rate:  [52-61] 61 (02/09 0556) Resp:  [16-18] 16 (02/09 0556) BP: (100-111)/(61-70) 100/61 (02/09 0556) SpO2:  [97 %-100 %] 97 % (02/09 0556) Weight:  [62.5 kg] 62.5 kg (02/09 0556) Last BM Date: 01/25/20 General:    white male in NAD Heart:  Regular rate and rhythm; no murmurs Lungs: Respirations even and unlabored, lungs CTA bilaterally Abdomen:  Soft, mild epigastric ttp and nondistended. Normal bowel sounds. Extremities:  Without edema. Neurologic:  Alert and oriented,  grossly normal neurologically. Psych:  Cooperative. Normal mood and affect.  Intake/Output from previous day: 02/08 0701 - 02/09 0700 In: 274.7 [IV Piggyback:274.7] Out: 1300 [Urine:1300] Intake/Output this shift: Total I/O In: -  Out: 375 [Urine:375]  Lab Results: Recent Labs    01/26/20 2029 01/27/20 0509 01/28/20 0145  WBC 12.7* 10.0 8.4  HGB 20.4* 15.3 14.6  HCT 58.4* 43.6 44.3  PLT 332 214 167   BMET Recent Labs    01/26/20 2029 01/27/20 0509 01/28/20 0145  NA 134* 134* 134*  K 3.1* 2.7* 3.3*  CL 70* 85* 92*  CO2 36* 34* 31  GLUCOSE 183* 101* 83  BUN 50* 54* 37*  CREATININE 2.45* 1.90* 1.39*  CALCIUM 10.1 8.2* 8.8*   LFT Recent Labs    01/28/20 0145  PROT 6.4*  ALBUMIN 3.7  AST 29  ALT 32  ALKPHOS 108  BILITOT 1.4*   Studies/Results: CT ABDOMEN PELVIS WO CONTRAST  Result Date: 01/26/2020 CLINICAL DATA:  Nausea and vomiting. EXAM: CT  ABDOMEN AND PELVIS WITHOUT CONTRAST TECHNIQUE: Multidetector CT imaging of the abdomen and pelvis was performed following the standard protocol without IV contrast. COMPARISON:  January 06, 2020 FINDINGS: Lower chest: No acute abnormality. Hepatobiliary: No focal liver abnormality is seen. The areas of heterogeneous enhancement seen throughout the liver on the prior contrast enhanced CT are not clearly identified on this noncontrast enhanced study P the gallbladder is moderately distended without evidence of gallstones, gallbladder wall thickening, or biliary dilatation. Pancreas: The pancreatic duct stent is again seen and unchanged in position. The pancreatic parenchyma is normal in appearance. Spleen: Normal in size without focal abnormality. Adrenals/Urinary Tract: Adrenal glands are unremarkable. Kidneys are normal, without renal calculi, focal lesion, or hydronephrosis. Bladder is unremarkable. Stomach/Bowel: There is marked severity gastric distension. The visualized loops of large and small bowel are normal in caliber. The appendix is surgically absent. No evidence of bowel wall thickening, distention, or inflammatory changes. Noninflamed diverticula are seen throughout the sigmoid colon. Vascular/Lymphatic: There is moderate severity aortic atherosclerosis. No enlarged abdominal or pelvic lymph nodes. Reproductive: Prostate is unremarkable. Other: No abdominal wall hernia or abnormality. No abdominopelvic ascites. Musculoskeletal: No acute or significant osseous findings. IMPRESSION: 1. Marked severity gastric distension, of uncertain etiology. Sequelae associated with gastric outlet obstruction cannot be excluded. 2. Stable pancreatic duct stent positioning. 3. Sigmoid diverticulosis. Aortic Atherosclerosis (ICD10-I70.0). Electronically Signed   By: Joyce Gross.D.  On: 01/26/2020 22:49   DG Basil Dess Tube Plc W/Fl W/Rad  Result Date: 01/27/2020 CLINICAL DATA:  Gastric outlet obstruction need for  nasogastric tube. EXAM: NASO G TUBE PLACEMENT WITH FL AND WITH RAD CONTRAST:  10 cc water-soluble contrast FLUOROSCOPY TIME:  Fluoroscopy Time:  1 minutes 6 seconds Radiation Exposure Index (if provided by the fluoroscopic device): 4.3 mGy Number of Acquired Spot Images: 0 COMPARISON:  Abdomen pelvis CT 01/26/2020 FINDINGS: Under real-time fluoroscopic guidance a nasogastric tube was advanced into the stomach, tip in the distal body of the stomach. 10 cc of water-soluble contrast was injected to confirm positioning. The stomach appear distended as seen on recent CT and incidental note is made of a pancreatic stent in the right upper quadrant. IMPRESSION: 1. Satisfactory position of nasogastric tube, position verified with fluoroscopy, and with injection of small amount of water-soluble contrast. 2. Gastric distention. 3. Pancreatic stent. Electronically Signed   By: Donzetta Kohut M.D.   On: 01/27/2020 17:41    Assessment / Plan:   Assessment: 1.  Nausea and vomiting with gastric outlet obstruction: Seems to be improving status post NG tube placement 01/27/2020 2.  Abdominal pain 3.  Abnormal LFTs 4.  History of portal vein thrombosis 5.  AKI 6.  Hypokalemia  Plan: 1.  Continue NG tube with low intermittent suction 2.  Patient can remain n.p.o. 3.  We will schedule patient for an EGD tomorrow  At 1230 with Dr. Myrtie Neither.  Did discuss risk, benefits, limitations and alternatives and patient agrees to proceed. 4.  Will need to hold IV heparin 6 hours prior to planned EGD tomorrow, this will be at [facility] 01/29/20 5.  Please await any further recommendations from Dr. Myrtie Neither later today.  Thank you for your kind consultation, we will continue to follow.   LOS: 1 day   Unk Lightning  01/28/2020, 10:02 AM  I have discussed the case with the PA, and that is the plan I formulated. I personally interviewed and examined the patient.  NG tube successfully placed in radiology last evening, with large  bilious output so far. He has had a little improvement in the upper abdominal pain and nausea today.  Mesenteric vein thrombosis, back on IV heparin, which will be stopped for his EGD tomorrow.  He is agreeable to upper endoscopy to evaluate because of gastric outlet obstruction.    The benefits and risks of the planned procedure were described in detail with the patient or (when appropriate) their health care proxy.  Risks were outlined as including, but not limited to, bleeding, infection, perforation, adverse medication reaction leading to cardiac or pulmonary decompensation, pancreatitis (if ERCP).  The limitation of incomplete mucosal visualization was also discussed.  No guarantees or warranties were given.  Fluid and electrolyte management per primary medical team.   Charlie Pitter III Office: [phone]

## 2020-01-28 NOTE — Progress Notes (Signed)
ANTICOAGULATION CONSULT NOTE - Follow Up Consult  Pharmacy Consult for Heparin Indication: Portal Vein Thrombosis  Allergies  Allergen Reactions  . Penicillins Rash    Has patient had a PCN reaction causing immediate rash, facial/tongue/throat swelling, SOB or lightheadedness with hypotension: yes Has patient had a PCN reaction causing severe rash involving mucus membranes or skin necrosis: Yes Has patient had a PCN reaction that required hospitalization: No Has patient had a PCN reaction occurring within the last 10 years: No If all of the above answers are "NO", then may proceed with Cephalosporin use.     Patient Measurements: Height: 5\' 9"  (175.3 cm) Weight: 134 lb 7.7 oz (61 kg) IBW/kg (Calculated) : 70.7 Heparin Dosing Weight:   Vital Signs: Temp: 98.1 F (36.7 C) (02/08 2203) Temp Source: Oral (02/08 2203) BP: 111/70 (02/08 2203) Pulse Rate: 52 (02/08 2203)  Labs: Recent Labs    01/26/20 1840 01/26/20 2029 01/26/20 2029 01/27/20 0509 01/27/20 1926 01/28/20 0145  HGB  --  20.4*   < > 15.3  --  14.6  HCT  --  58.4*  --  43.6  --  44.3  PLT  --  332  --  214  --  167  APTT  --   --   --   --  32 85*  HEPARINUNFRC  --   --   --   --  1.48* 1.68*  CREATININE  --  2.45*  --  1.90*  --  1.39*  CKTOTAL 32*  --   --   --   --   --   CKMB 0.4*  --   --   --   --   --    < > = values in this interval not displayed.    Estimated Creatinine Clearance: 61.6 mL/min (A) (by C-G formula based on SCr of 1.39 mg/dL (H)).   Medications:  Infusions:  . heparin 1,000 Units/hr (01/27/20 2010)  . lactated ringers with kcl 125 mL/hr at 01/27/20 1508    Assessment: Patient with heparin level above goal but PTT at goal.  PTT ordered with Heparin level until both correlate due to possible drug-lab interaction between oral anticoagulant (rivaroxaban, edoxaban, or apixaban) and anti-Xa level (aka heparin level)   Goal of Therapy:  Heparin level 0.3-0.7 units/ml aPTT 66-102  seconds Monitor platelets by anticoagulation protocol: Yes   Plan:  Continue heparin drip at current rate Recheck level at 1000  1509, Paul Allen 01/28/2020,4:08 AM

## 2020-01-28 NOTE — Progress Notes (Signed)
ANTICOAGULATION CONSULT NOTE - Initial Consult  Pharmacy Consult for Heparin Indication: Portal vein thrombosis  Allergies  Allergen Reactions  . Penicillins Rash    Has patient had a PCN reaction causing immediate rash, facial/tongue/throat swelling, SOB or lightheadedness with hypotension: yes Has patient had a PCN reaction causing severe rash involving mucus membranes or skin necrosis: Yes Has patient had a PCN reaction that required hospitalization: No Has patient had a PCN reaction occurring within the last 10 years: No If all of the above answers are "NO", then may proceed with Cephalosporin use.     Patient Measurements: Height: 5\' 9"  (175.3 cm) Weight: 137 lb 11.2 oz (62.5 kg) IBW/kg (Calculated) : 70.7 Heparin Dosing Weight: n/a. Use TBW = 62 kg  Vital Signs: Temp: 98.2 F (36.8 C) (02/09 1404) Temp Source: Oral (02/09 1404) BP: 115/88 (02/09 1404) Pulse Rate: 53 (02/09 1404)  Labs: Recent Labs    01/26/20 1840 01/26/20 2029 01/26/20 2029 01/27/20 0509 01/27/20 1926 01/27/20 1926 01/28/20 0145 01/28/20 1048 01/28/20 1914  HGB  --  20.4*   < > 15.3  --   --  14.6  --   --   HCT  --  58.4*  --  43.6  --   --  44.3  --   --   PLT  --  332  --  214  --   --  167  --   --   APTT  --   --   --   --  32   < > 85* 155* 51*  HEPARINUNFRC  --   --   --   --  1.48*  --  1.68*  --   --   CREATININE  --  2.45*  --  1.90*  --   --  1.39*  --   --   CKTOTAL 32*  --   --   --   --   --   --   --   --   CKMB 0.4*  --   --   --   --   --   --   --   --    < > = values in this interval not displayed.    Estimated Creatinine Clearance: 63.1 mL/min (A) (by C-G formula based on SCr of 1.39 mg/dL (H)).   Medical History: Past Medical History:  Diagnosis Date  . Abdominal pain   . Mesenteric vein thrombosis ([place])    Dec 2020 in setting of pancreatitis  . Nausea & vomiting   . Pancreatitis     Medications:  PTA Eliquis 5mg  BID - LD 01/27/20 @ 05:47  Assessment: 62yr  male admitted with nausea/vomiting/abdominal pain;acute pancreatitis. PMH significant for alcoholic pancreatitis and portal vein thrombosis (on Eliquis PTA). Pt currently NPO, GI consulted. Eliquis being held, pharmacy consulted to dose/monitor IV heparin.  Baseline labs: -CBC: Hgb 15.3, Plt 214 -HL 1.48, falsely elevated due to Eliquis  Today, 01/28/20  APTT = 155 seconds is supratherapeutic on heparin infusion of 1000 units/hr.   CBC: Hgb and Plt - WNL  2nd shift follow up:  19:14 aPTT = 51 sec with heparin @ 800 units/hr  No complications of therapy noted  Goal of Therapy:  Heparin level 0.3-0.7 units/ml aPTT 66-102 seconds Monitor platelets by anticoagulation protocol: Yes   Plan:   Increase heparin infusion to 950 units/hr  Check 6 hour aPTT/HL   Monitor aPTT until aPTT and heparin level correlate. Once correlating, can monitor  using HL only.  Daily CBC and heparin level while on IV heparin gtt  Follow along for ability to resume PO anticoagulation  GI planning to perform EGD tomorrow, heparin drip to stop at 0630 on 2/10 per GI note.   Maryellen Pile, PharmD 01/28/2020,8:15 PM

## 2020-01-28 NOTE — Progress Notes (Signed)
PROGRESS NOTE    Paul Allen  SWF:[id number] DOB: January 17, 1981 DOA: 01/26/2020 PCP: Kerin Perna, NP   Brief Narrative: 55 yom history of alcoholic pancreatitis/portal vein thrombosis on Eliquis presents with nausea vomiting generalized abdominal pain 3 days prior to admission on 2/7.  Patient is trying liquid diet but pain got worse denies using alcohol recently.  Work-up in the ED showed CT scan with gastric distention uncertain etiology sequelae associated with gastric outlet obstruction could not be excluded, stable pancreatic duct stent positioning, sigmoid diverticulosis.  Labs showed acute renal failure, elevated BUN, patient was admitted on IV fluids IV pain medication and GI was consulted.  Recent multiple admissions/ED or pcpc visits on 11/28/19, 12/02/2019, 01/02/2020, 01/07/2020.   Subjective: Seen and examined this morning. Afebrile overnight.  Potassium 3.3. Feeling better with NGT No diarrhea. vomited last night before ngt Passing gas.  Assessment & Plan:  Nausea & vomiting, suspecting gastric outlet obstruction as seen on the CT abdomen: s/p IR guided NG tube placement and feeling better with it.  New NG tube with intermittent suction.  J plan for endoscopy tomorrow. Continue supportive care with IV fluids, pain medication, antiemetics, n.p.o.  Hypokalemia: Due to #1 replete aggressively.  ARF: Suspecting prerenal/volume depletion due to #1 nicely improving, continue IV fluids  Abnormal LFTs- acute hep panel negative, suspecting component of inflammation edema  History of portal vein thrombosis/chronic superior mesenteric on Eliquis currently transitioned to heparin gtt. hold heparin 6 hours prior to the endoscopy tomorrow around 630 per GI.  Nutrition: Nutrition Problem: Inadequate oral intake Etiology: inability to eat Signs/Symptoms: NPO status Interventions: Refer to RD note for recommendations   Body mass index is 20.33 kg/m.   DVT  prophylaxis:Heparin gtt Code Status:full Family Communication: plan of care discussed with patient at bedside. Disposition Plan:Patient is from: Home       Anticipated d/c to: Home/TBD, 1-2 days       Barriers to d/c or conditions that needs to be met prior to d/c: Remains inpatient for management of nausea vomiting  Consultants:None  Procedures:see note Microbiology:see note Antimicrobials: Anti-infectives (From admission, onward)   None      Medications: Scheduled Meds: . folic acid  1 mg Oral Daily  . pantoprazole (PROTONIX) IV  40 mg Intravenous Q12H  . thiamine  100 mg Oral Daily   Continuous Infusions: . heparin 1,000 Units/hr (01/27/20 2010)  . lactated ringers with kcl 125 mL/hr at 01/27/20 1508    Objective: Vitals:   01/27/20 0456 01/27/20 1340 01/27/20 2203 01/28/20 0556  BP: 122/78 102/70 111/70 100/61  Pulse: (!) 59 (!) 53 (!) 52 61  Resp: '18 18 17 16  ' Temp: 97.6 F (36.4 C) 98 F (36.7 C) 98.1 F (36.7 C) (!) 97.4 F (36.3 C)  TempSrc: Oral Oral Oral Oral  SpO2: 100% 99% 100% 97%  Weight:    62.5 kg  Height:        Intake/Output Summary (Last 24 hours) at 01/28/2020 0820 Last data filed at 01/28/2020 0557 Gross per 24 hour  Intake 274.66 ml  Output 1150 ml  Net -875.34 ml   Filed Weights   01/26/20 1943 01/27/20 0221 01/28/20 0556  Weight: 57.8 kg 61 kg 62.5 kg   Weight change: 4.672 kg  Body mass index is 20.33 kg/m.  Intake/Output from previous day: 02/08 0701 - 02/09 0700 In: 274.7 [IV Piggyback:274.7] Out: 1300 [Urine:1300] Intake/Output this shift: No intake/output data recorded.  Examination:  General exam: AAOx3, NGT+,NAD, Weak  appearing. HEENT:Oral mucosa moist, Ear/Nose WNL grossly, dentition normal. Respiratory system: Diminished at the base,no wheezing or crackles,no use of accessory muscle Cardiovascular system: S1 & S2 +, No JVD,. Gastrointestinal system: Abdomen soft, centrally mildly Tender,ND, BS+ Nervous System:Alert,  awake, moving extremities and grossly nonfocal Extremities: No edema, distal peripheral pulses palpable.  Skin: No rashes,no icterus. MSK: Normal muscle bulk,tone, power   Data Reviewed: I have personally reviewed following labs and imaging studies  CBC: Recent Labs  Lab 01/26/20 2029 01/27/20 0509 01/28/20 0145  WBC 12.7* 10.0 8.4  HGB 20.4* 15.3 14.6  HCT 58.4* 43.6 44.3  MCV 89.0 90.1 92.9  PLT 332 214 038   Basic Metabolic Panel: Recent Labs  Lab 01/26/20 2029 01/27/20 0509 01/28/20 0145  NA 134* 134* 134*  K 3.1* 2.7* 3.3*  CL 70* 85* 92*  CO2 36* 34* 31  GLUCOSE 183* 101* 83  BUN 50* 54* 37*  CREATININE 2.45* 1.90* 1.39*  CALCIUM 10.1 8.2* 8.8*  MG  --   --  1.9   GFR: Estimated Creatinine Clearance: 63.1 mL/min (A) (by C-G formula based on SCr of 1.39 mg/dL (H)). Liver Function Tests: Recent Labs  Lab 01/26/20 2029 01/27/20 0509 01/28/20 0145  AST 37 23 29  ALT 49* 30 32  ALKPHOS 200* 117 108  BILITOT 1.6* 0.9 1.4*  PROT 10.3* 6.6 6.4*  ALBUMIN 5.5* 3.9 3.7   Recent Labs  Lab 01/26/20 2029 01/27/20 0509  LIPASE 97* 72*   No results for input(s): AMMONIA in the last 168 hours. Coagulation Profile: No results for input(s): INR, PROTIME in the last 168 hours. Cardiac Enzymes: Recent Labs  Lab 01/26/20 1840  CKTOTAL 32*  CKMB 0.4*   BNP (last 3 results) No results for input(s): PROBNP in the last 8760 hours. HbA1C: No results for input(s): HGBA1C in the last 72 hours. CBG: No results for input(s): GLUCAP in the last 168 hours. Lipid Profile: No results for input(s): CHOL, HDL, LDLCALC, TRIG, CHOLHDL, LDLDIRECT in the last 72 hours. Thyroid Function Tests: No results for input(s): TSH, T4TOTAL, FREET4, T3FREE, THYROIDAB in the last 72 hours. Anemia Panel: No results for input(s): VITAMINB12, FOLATE, FERRITIN, TIBC, IRON, RETICCTPCT in the last 72 hours. Sepsis Labs: No results for input(s): PROCALCITON, LATICACIDVEN in the last 168  hours.  Recent Results (from the past 240 hour(s))  SARS CORONAVIRUS 2 (TAT 6-24 HRS) Nasopharyngeal Nasopharyngeal Swab     Status: None   Collection Time: 01/26/20 10:11 PM   Specimen: Nasopharyngeal Swab  Result Value Ref Range Status   SARS Coronavirus 2 NEGATIVE NEGATIVE Final    Comment: (NOTE) SARS-CoV-2 target nucleic acids are NOT DETECTED. The SARS-CoV-2 RNA is generally detectable in upper and lower respiratory specimens during the acute phase of infection. Negative results do not preclude SARS-CoV-2 infection, do not rule out co-infections with other pathogens, and should not be used as the sole basis for treatment or other patient management decisions. Negative results must be combined with clinical observations, patient history, and epidemiological information. The expected result is Negative. Fact Sheet for Patients: SugarRoll.be Fact Sheet for Healthcare Providers: https://www.woods-mathews.com/ This test is not yet approved or cleared by the [place] FDA and  has been authorized for detection and/or diagnosis of SARS-CoV-2 by FDA under an Emergency Use Authorization (EUA). This EUA will remain  in effect (meaning this test can be used) for the duration of the COVID-19 declaration under Section 56 4(b)(1) of the Act, 21 U.S.C. section 360bbb-3(b)(1), unless  the authorization is terminated or revoked sooner. Performed at [facility], [place] [address], [place], [place] [address]       Radiology Studies: CT ABDOMEN PELVIS WO CONTRAST  Result Date: 01/26/2020 CLINICAL DATA:  Nausea and vomiting. EXAM: CT ABDOMEN AND PELVIS WITHOUT CONTRAST TECHNIQUE: Multidetector CT imaging of the abdomen and pelvis was performed following the standard protocol without IV contrast. COMPARISON:  January 06, 2020 FINDINGS: Lower chest: No acute abnormality. Hepatobiliary: No focal liver abnormality is seen. The areas of heterogeneous  enhancement seen throughout the liver on the prior contrast enhanced CT are not clearly identified on this noncontrast enhanced study P the gallbladder is moderately distended without evidence of gallstones, gallbladder wall thickening, or biliary dilatation. Pancreas: The pancreatic duct stent is again seen and unchanged in position. The pancreatic parenchyma is normal in appearance. Spleen: Normal in size without focal abnormality. Adrenals/Urinary Tract: Adrenal glands are unremarkable. Kidneys are normal, without renal calculi, focal lesion, or hydronephrosis. Bladder is unremarkable. Stomach/Bowel: There is marked severity gastric distension. The visualized loops of large and small bowel are normal in caliber. The appendix is surgically absent. No evidence of bowel wall thickening, distention, or inflammatory changes. Noninflamed diverticula are seen throughout the sigmoid colon. Vascular/Lymphatic: There is moderate severity aortic atherosclerosis. No enlarged abdominal or pelvic lymph nodes. Reproductive: Prostate is unremarkable. Other: No abdominal wall hernia or abnormality. No abdominopelvic ascites. Musculoskeletal: No acute or significant osseous findings. IMPRESSION: 1. Marked severity gastric distension, of uncertain etiology. Sequelae associated with gastric outlet obstruction cannot be excluded. 2. Stable pancreatic duct stent positioning. 3. Sigmoid diverticulosis. Aortic Atherosclerosis (ICD10-I70.0). Electronically Signed   By: Virgina Norfolk M.D.   On: 01/26/2020 22:49   DG Loyce Dys Tube Plc W/Fl W/Rad  Result Date: 01/27/2020 CLINICAL DATA:  Gastric outlet obstruction need for nasogastric tube. EXAM: NASO G TUBE PLACEMENT WITH FL AND WITH RAD CONTRAST:  10 cc water-soluble contrast FLUOROSCOPY TIME:  Fluoroscopy Time:  1 minutes 6 seconds Radiation Exposure Index (if provided by the fluoroscopic device): 4.3 mGy Number of Acquired Spot Images: 0 COMPARISON:  Abdomen pelvis CT 01/26/2020  FINDINGS: Under real-time fluoroscopic guidance a nasogastric tube was advanced into the stomach, tip in the distal body of the stomach. 10 cc of water-soluble contrast was injected to confirm positioning. The stomach appear distended as seen on recent CT and incidental note is made of a pancreatic stent in the right upper quadrant. IMPRESSION: 1. Satisfactory position of nasogastric tube, position verified with fluoroscopy, and with injection of small amount of water-soluble contrast. 2. Gastric distention. 3. Pancreatic stent. Electronically Signed   By: Zetta Bills M.D.   On: 01/27/2020 17:41     LOS: 1 day   Time spent: More than 50% of that time was spent in counseling and/or coordination of care.  Antonieta Pert, MD [facility]  01/28/2020, 8:20 AM

## 2020-01-28 NOTE — H&P (View-Only) (Signed)
  Progress Note   Subjective  Chief Complaint: Gastric outlet obstruction, history of complicated alcoholic pancreatitis with portal vein thrombosis  Today, the patient tells me that he feels some better, his pain level has decreased, he finds the NG tube uncomfortable but is happy to have it in there to help him get better.  He is aware of plans for likely EGD tomorrow, denies any new complaints or concerns.   Objective   Vital signs in last 24 hours: Temp:  [97.4 F (36.3 C)-98.1 F (36.7 C)] 97.4 F (36.3 C) (02/09 0556) Pulse Rate:  [52-61] 61 (02/09 0556) Resp:  [16-18] 16 (02/09 0556) BP: (100-111)/(61-70) 100/61 (02/09 0556) SpO2:  [97 %-100 %] 97 % (02/09 0556) Weight:  [62.5 kg] 62.5 kg (02/09 0556) Last BM Date: 01/25/20 General:    white male in NAD Heart:  Regular rate and rhythm; no murmurs Lungs: Respirations even and unlabored, lungs CTA bilaterally Abdomen:  Soft, mild epigastric ttp and nondistended. Normal bowel sounds. Extremities:  Without edema. Neurologic:  Alert and oriented,  grossly normal neurologically. Psych:  Cooperative. Normal mood and affect.  Intake/Output from previous day: 02/08 0701 - 02/09 0700 In: 274.7 [IV Piggyback:274.7] Out: 1300 [Urine:1300] Intake/Output this shift: Total I/O In: -  Out: 375 [Urine:375]  Lab Results: Recent Labs    01/26/20 2029 01/27/20 0509 01/28/20 0145  WBC 12.7* 10.0 8.4  HGB 20.4* 15.3 14.6  HCT 58.4* 43.6 44.3  PLT 332 214 167   BMET Recent Labs    01/26/20 2029 01/27/20 0509 01/28/20 0145  NA 134* 134* 134*  K 3.1* 2.7* 3.3*  CL 70* 85* 92*  CO2 36* 34* 31  GLUCOSE 183* 101* 83  BUN 50* 54* 37*  CREATININE 2.45* 1.90* 1.39*  CALCIUM 10.1 8.2* 8.8*   LFT Recent Labs    01/28/20 0145  PROT 6.4*  ALBUMIN 3.7  AST 29  ALT 32  ALKPHOS 108  BILITOT 1.4*   Studies/Results: CT ABDOMEN PELVIS WO CONTRAST  Result Date: 01/26/2020 CLINICAL DATA:  Nausea and vomiting. EXAM: CT  ABDOMEN AND PELVIS WITHOUT CONTRAST TECHNIQUE: Multidetector CT imaging of the abdomen and pelvis was performed following the standard protocol without IV contrast. COMPARISON:  January 06, 2020 FINDINGS: Lower chest: No acute abnormality. Hepatobiliary: No focal liver abnormality is seen. The areas of heterogeneous enhancement seen throughout the liver on the prior contrast enhanced CT are not clearly identified on this noncontrast enhanced study P the gallbladder is moderately distended without evidence of gallstones, gallbladder wall thickening, or biliary dilatation. Pancreas: The pancreatic duct stent is again seen and unchanged in position. The pancreatic parenchyma is normal in appearance. Spleen: Normal in size without focal abnormality. Adrenals/Urinary Tract: Adrenal glands are unremarkable. Kidneys are normal, without renal calculi, focal lesion, or hydronephrosis. Bladder is unremarkable. Stomach/Bowel: There is marked severity gastric distension. The visualized loops of large and small bowel are normal in caliber. The appendix is surgically absent. No evidence of bowel wall thickening, distention, or inflammatory changes. Noninflamed diverticula are seen throughout the sigmoid colon. Vascular/Lymphatic: There is moderate severity aortic atherosclerosis. No enlarged abdominal or pelvic lymph nodes. Reproductive: Prostate is unremarkable. Other: No abdominal wall hernia or abnormality. No abdominopelvic ascites. Musculoskeletal: No acute or significant osseous findings. IMPRESSION: 1. Marked severity gastric distension, of uncertain etiology. Sequelae associated with gastric outlet obstruction cannot be excluded. 2. Stable pancreatic duct stent positioning. 3. Sigmoid diverticulosis. Aortic Atherosclerosis (ICD10-I70.0). Electronically Signed   By: Thaddeus  Houston M.D.     On: 01/26/2020 22:49   DG Basil Dess Tube Plc W/Fl W/Rad  Result Date: 01/27/2020 CLINICAL DATA:  Gastric outlet obstruction need for  nasogastric tube. EXAM: NASO G TUBE PLACEMENT WITH FL AND WITH RAD CONTRAST:  10 cc water-soluble contrast FLUOROSCOPY TIME:  Fluoroscopy Time:  1 minutes 6 seconds Radiation Exposure Index (if provided by the fluoroscopic device): 4.3 mGy Number of Acquired Spot Images: 0 COMPARISON:  Abdomen pelvis CT 01/26/2020 FINDINGS: Under real-time fluoroscopic guidance a nasogastric tube was advanced into the stomach, tip in the distal body of the stomach. 10 cc of water-soluble contrast was injected to confirm positioning. The stomach appear distended as seen on recent CT and incidental note is made of a pancreatic stent in the right upper quadrant. IMPRESSION: 1. Satisfactory position of nasogastric tube, position verified with fluoroscopy, and with injection of small amount of water-soluble contrast. 2. Gastric distention. 3. Pancreatic stent. Electronically Signed   By: Donzetta Kohut M.D.   On: 01/27/2020 17:41    Assessment / Plan:   Assessment: 1.  Nausea and vomiting with gastric outlet obstruction: Seems to be improving status post NG tube placement 01/27/2020 2.  Abdominal pain 3.  Abnormal LFTs 4.  History of portal vein thrombosis 5.  AKI 6.  Hypokalemia  Plan: 1.  Continue NG tube with low intermittent suction 2.  Patient can remain n.p.o. 3.  We will schedule patient for an EGD tomorrow  At 1230 with Dr. Myrtie Neither.  Did discuss risk, benefits, limitations and alternatives and patient agrees to proceed. 4.  Will need to hold IV heparin 6 hours prior to planned EGD tomorrow, this will be at [facility] 01/29/20 5.  Please await any further recommendations from Dr. Myrtie Neither later today.  Thank you for your kind consultation, we will continue to follow.   LOS: 1 day   Unk Lightning  01/28/2020, 10:02 AM  I have discussed the case with the PA, and that is the plan I formulated. I personally interviewed and examined the patient.  NG tube successfully placed in radiology last evening, with large  bilious output so far. He has had a little improvement in the upper abdominal pain and nausea today.  Mesenteric vein thrombosis, back on IV heparin, which will be stopped for his EGD tomorrow.  He is agreeable to upper endoscopy to evaluate because of gastric outlet obstruction.    The benefits and risks of the planned procedure were described in detail with the patient or (when appropriate) their health care proxy.  Risks were outlined as including, but not limited to, bleeding, infection, perforation, adverse medication reaction leading to cardiac or pulmonary decompensation, pancreatitis (if ERCP).  The limitation of incomplete mucosal visualization was also discussed.  No guarantees or warranties were given.  Fluid and electrolyte management per primary medical team.   Charlie Pitter III Office: [phone]

## 2020-01-29 ENCOUNTER — Encounter (HOSPITAL_COMMUNITY)
Admission: EM | Disposition: A | Discharge status: Home / Self Care | Payer: Self-pay | Source: Home / Self Care | Attending: Internal Medicine

## 2020-01-29 ENCOUNTER — Encounter (HOSPITAL_COMMUNITY): Payer: Self-pay | Admitting: Internal Medicine

## 2020-01-29 ENCOUNTER — Inpatient Hospital Stay (HOSPITAL_COMMUNITY): Payer: Self-pay | Admitting: Anesthesiology

## 2020-01-29 DIAGNOSIS — K2970 Gastritis, unspecified, without bleeding: Secondary | ICD-10-CM

## 2020-01-29 DIAGNOSIS — K311 Adult hypertrophic pyloric stenosis: Secondary | ICD-10-CM | POA: Diagnosis present

## 2020-01-29 HISTORY — PX: ESOPHAGOGASTRODUODENOSCOPY (EGD) WITH PROPOFOL: SHX5813

## 2020-01-29 HISTORY — PX: BIOPSY: SHX5522

## 2020-01-29 LAB — COMPREHENSIVE METABOLIC PANEL
ALT: 27 U/L (ref 0–44)
AST: 22 U/L (ref 15–41)
Albumin: 3.6 g/dL (ref 3.5–5.0)
Alkaline Phosphatase: 99 U/L (ref 38–126)
Anion gap: 12 (ref 5–15)
BUN: 25 mg/dL — ABNORMAL HIGH (ref 6–20)
CO2: 25 mmol/L (ref 22–32)
Calcium: 8.7 mg/dL — ABNORMAL LOW (ref 8.9–10.3)
Chloride: 100 mmol/L (ref 98–111)
Creatinine, Ser: 1.03 mg/dL (ref 0.61–1.24)
GFR calc Af Amer: >60 mL/min (ref >60)
GFR calc non Af Amer: >60 mL/min (ref >60)
Glucose, Bld: 63 mg/dL — ABNORMAL LOW (ref 70–99)
Potassium: 3.5 mmol/L (ref 3.5–5.1)
Sodium: 137 mmol/L (ref 135–145)
Total Bilirubin: 1.9 mg/dL — ABNORMAL HIGH (ref 0.3–1.2)
Total Protein: 6.1 g/dL — ABNORMAL LOW (ref 6.5–8.1)

## 2020-01-29 LAB — CBC
HCT: 41.6 % (ref 39.0–52.0)
Hemoglobin: 13.9 g/dL (ref 13.0–17.0)
MCH: 30.7 pg (ref 26.0–34.0)
MCHC: 33.4 g/dL (ref 30.0–36.0)
MCV: 91.8 fL (ref 80.0–100.0)
Platelets: 135 10*3/uL — ABNORMAL LOW (ref 150–400)
RBC: 4.53 MIL/uL (ref 4.22–5.81)
RDW: 13.1 % (ref 11.5–15.5)
WBC: 5.5 10*3/uL (ref 4.0–10.5)
nRBC: 0 % (ref 0.0–0.2)

## 2020-01-29 LAB — APTT
aPTT: 109 seconds — ABNORMAL HIGH (ref 24–36)
aPTT: 56 seconds — ABNORMAL HIGH (ref 24–36)

## 2020-01-29 LAB — HEPARIN LEVEL (UNFRACTIONATED)
Heparin Unfractionated: 0.75 IU/mL — ABNORMAL HIGH (ref 0.30–0.70)
Heparin Unfractionated: 0.3 IU/mL (ref 0.30–0.70)

## 2020-01-29 SURGERY — ESOPHAGOGASTRODUODENOSCOPY (EGD) WITH PROPOFOL
Anesthesia: Monitor Anesthesia Care

## 2020-01-29 MED ORDER — FENTANYL CITRATE (PF) 100 MCG/2ML IJ SOLN
INTRAMUSCULAR | Status: DC | PRN
  Administered 2020-01-29 (×2): 50 ug via INTRAVENOUS

## 2020-01-29 MED ORDER — HEPARIN (PORCINE) 25000 UT/250ML-% IV SOLN
1000.0000 [IU]/h | INTRAVENOUS | Status: DC
  Administered 2020-01-29: 900 [IU]/h via INTRAVENOUS

## 2020-01-29 MED ORDER — PROPOFOL 10 MG/ML IV BOLUS
INTRAVENOUS | Status: DC | PRN
  Administered 2020-01-29: 120 mg via INTRAVENOUS

## 2020-01-29 MED ORDER — FENTANYL CITRATE (PF) 100 MCG/2ML IJ SOLN
INTRAMUSCULAR | Status: AC
  Filled 2020-01-29: qty 2

## 2020-01-29 MED ORDER — GLUCAGON HCL RDNA (DIAGNOSTIC) 1 MG IJ SOLR
INTRAMUSCULAR | Status: AC
  Filled 2020-01-29: qty 1

## 2020-01-29 MED ORDER — SODIUM CHLORIDE 0.9 % IV SOLN: INTRAVENOUS | Status: DC

## 2020-01-29 MED ORDER — DEXAMETHASONE SODIUM PHOSPHATE 10 MG/ML IJ SOLN
INTRAMUSCULAR | Status: DC | PRN
  Administered 2020-01-29: 10 mg via INTRAVENOUS

## 2020-01-29 MED ORDER — LACTATED RINGERS IV SOLN
INTRAVENOUS | Status: DC
  Administered 2020-01-29: 1000 mL via INTRAVENOUS

## 2020-01-29 MED ORDER — INDOMETHACIN 50 MG RE SUPP
RECTAL | Status: AC
  Filled 2020-01-29: qty 2

## 2020-01-29 MED ORDER — CIPROFLOXACIN IN D5W 400 MG/200ML IV SOLN
INTRAVENOUS | Status: AC
  Filled 2020-01-29: qty 200

## 2020-01-29 MED ORDER — ONDANSETRON HCL 4 MG/2ML IJ SOLN
INTRAMUSCULAR | Status: DC | PRN
  Administered 2020-01-29: 4 mg via INTRAVENOUS

## 2020-01-29 MED ORDER — LIDOCAINE 2% (20 MG/ML) 5 ML SYRINGE
INTRAMUSCULAR | Status: DC | PRN
  Administered 2020-01-29: 60 mg via INTRAVENOUS

## 2020-01-29 MED ORDER — PROPOFOL 10 MG/ML IV BOLUS
INTRAVENOUS | Status: AC
  Filled 2020-01-29: qty 20

## 2020-01-29 MED ORDER — SUCCINYLCHOLINE CHLORIDE 200 MG/10ML IV SOSY
PREFILLED_SYRINGE | INTRAVENOUS | Status: DC | PRN
  Administered 2020-01-29: 80 mg via INTRAVENOUS

## 2020-01-29 MED ORDER — MIDAZOLAM HCL 5 MG/5ML IJ SOLN
INTRAMUSCULAR | Status: DC | PRN
  Administered 2020-01-29: 2 mg via INTRAVENOUS

## 2020-01-29 MED ORDER — ENSURE ENLIVE PO LIQD
237.0000 mL | Freq: Three times a day (TID) | ORAL | Status: DC
  Administered 2020-01-29 – 2020-02-02 (×9): 237 mL via ORAL

## 2020-01-29 MED ORDER — MIDAZOLAM HCL 2 MG/2ML IJ SOLN
INTRAMUSCULAR | Status: AC
  Filled 2020-01-29: qty 2

## 2020-01-29 SURGICAL SUPPLY — 15 items

## 2020-01-29 NOTE — Anesthesia Postprocedure Evaluation (Signed)
Anesthesia Post Note  Patient: Paul Allen  Procedure(s) Performed: ESOPHAGOGASTRODUODENOSCOPY (EGD) WITH PROPOFOL (N/A ) BIOPSY     Patient location during evaluation: PACU Anesthesia Type: General Level of consciousness: awake and alert and oriented Pain management: pain level controlled Vital Signs Assessment: post-procedure vital signs reviewed and stable Respiratory status: spontaneous breathing, nonlabored ventilation and respiratory function stable Cardiovascular status: blood pressure returned to baseline Postop Assessment: no apparent nausea or vomiting Anesthetic complications: no    Last Vitals:  Vitals:   01/29/20 1403 01/29/20 1410  BP: 137/78 128/70  Pulse: (!) 59 (!) 57  Resp: 11 (!) 21  Temp:    SpO2: 100% 100%    Last Pain:  Vitals:   01/29/20 1410  TempSrc:   PainSc: 0-No pain                 Kaylyn Layer

## 2020-01-29 NOTE — Anesthesia Preprocedure Evaluation (Addendum)
Anesthesia Evaluation  Patient identified by MRN, date of birth, ID band Patient awake    Reviewed: Allergy & Precautions, NPO status , Patient's Chart, lab work & pertinent test results  History of Anesthesia Complications Negative for: history of anesthetic complications  Airway Mallampati: II  TM Distance: >3 FB Neck ROM: Full    Dental no notable dental hx.    Pulmonary Current Smoker,    Pulmonary exam normal        Cardiovascular negative cardio ROS Normal cardiovascular exam     Neuro/Psych negative neurological ROS  negative psych ROS   GI/Hepatic (+)     substance abuse  marijuana use, Hx of EtOH pancreatitis and portal venous thrombosis   Endo/Other  negative endocrine ROS  Renal/GU negative Renal ROS  negative genitourinary   Musculoskeletal negative musculoskeletal ROS (+)   Abdominal   Peds  Hematology plts 135   Anesthesia Other Findings Day of surgery medications reviewed with patient.  Reproductive/Obstetrics negative OB ROS                            Anesthesia Physical Anesthesia Plan  ASA: II  Anesthesia Plan: General   Post-op Pain Management:    Induction: Intravenous  PONV Risk Score and Plan: 1 and Treatment may vary due to age or medical condition, Ondansetron and Midazolam  Airway Management Planned: Oral ETT  Additional Equipment: None  Intra-op Plan:   Post-operative Plan: Extubation in OR  Informed Consent: I have reviewed the patients History and Physical, chart, labs and discussed the procedure including the risks, benefits and alternatives for the proposed anesthesia with the patient or authorized representative who has indicated his/her understanding and acceptance.     Dental advisory given  Plan Discussed with: CRNA  Anesthesia Plan Comments:       Anesthesia Quick Evaluation

## 2020-01-29 NOTE — Progress Notes (Signed)
PROGRESS NOTE    Paul Allen  PVX:[id number] DOB: 1981/05/01 DOA: 01/26/2020 PCP: Kerin Perna, NP   Brief Narrative: 82 yom history of alcoholic pancreatitis/portal vein thrombosis on Eliquis presents with nausea vomiting generalized abdominal pain 3 days prior to admission on 2/7.  Patient is trying liquid diet but pain got worse denies using alcohol recently.  Work-up in the ED showed CT scan with gastric distention uncertain etiology sequelae associated with gastric outlet obstruction could not be excluded, stable pancreatic duct stent positioning, sigmoid diverticulosis.  Labs showed acute renal failure, elevated BUN, patient was admitted on IV fluids IV pain medication and GI was consulted.  Recent multiple admissions/ED or pcpc visits on 11/28/19, 12/02/2019, 01/02/2020, 01/07/2020.   Subjective: Seen and examined this morning. He was getting ready for endoscopy.  Denies nausea vomiting, NG tube in place.  Assessment & Plan:  Nausea & vomiting, suspecting gastric outlet obstruction as seen on the CT abdomen: s/p IR guided NG tube placement and feeling better with it.  On intermittent suction.  For endoscopy today.Continue supportive care with IV fluids, pain medication, antiemetics, n.p.o. patient gastroenterology input.  Hypokalemia: Resolved.    ARF: Suspecting prerenal/volume depletion due to #1.  Renal functions improved to baseline.  Continue gentle IV fluid hydration.    Abnormal LFTs- acute hep panel negative, suspecting component of inflammation edema.  LFTs have normalized.   History of portal vein thrombosis/chronic superior mesenteric on Eliquis currently transitioned to heparin gtt. hopefully transition to Eliquis once okay with gastroenterology and on oral diet.   Nutrition: Nutrition Problem: Inadequate oral intake Etiology: inability to eat Signs/Symptoms: NPO status Interventions: Refer to RD note for recommendations   Body mass index is 20.31 kg/m.     DVT prophylaxis:Heparin gtt Code Status:full Family Communication: plan of care discussed with patient at bedside. Disposition Plan:Patient is from: Home       Anticipated d/c to: Home/TBD, 1-2 days       Barriers to d/c or conditions that needs to be met prior to d/c: Remains inpatient for EGD, will need to see how he tolerates the diet and home once cleared by GI.   Consultants:GI Procedures: egd 2/10 Microbiology:see note Antimicrobials: Anti-infectives (From admission, onward)   None      Medications: Scheduled Meds: . folic acid  1 mg Oral Daily  . pantoprazole (PROTONIX) IV  40 mg Intravenous Q12H  . thiamine  100 mg Oral Daily   Continuous Infusions: . lactated ringers with kcl 125 mL/hr at 01/29/20 0946    Objective: Vitals:   01/28/20 0556 01/28/20 1404 01/28/20 2052 01/29/20 0456  BP: 100/61 115/88 121/79 115/79  Pulse: 61 (!) 53 (!) 54 (!) 48  Resp: '16 16 16 17  ' Temp: (!) 97.4 F (36.3 C) 98.2 F (36.8 C) 98.7 F (37.1 C) 98.2 F (36.8 C)  TempSrc: Oral Oral Oral Oral  SpO2: 97% 100% 100% 100%  Weight: 62.5 kg   62.4 kg  Height:        Intake/Output Summary (Last 24 hours) at 01/29/2020 1043 Last data filed at 01/29/2020 0815 Gross per 24 hour  Intake 5224.8 ml  Output 3000 ml  Net 2224.8 ml   Filed Weights   01/27/20 0221 01/28/20 0556 01/29/20 0456  Weight: 61 kg 62.5 kg 62.4 kg   Weight change: -0.091 kg  Body mass index is 20.31 kg/m.  Intake/Output from previous day: 02/09 0701 - 02/10 0700 In: 5164.8 [P.O.:530; I.V.:4634.8] Out: 4827 [Urine:875; Emesis/NG output:2500]  Intake/Output this shift: Total I/O In: 60 [P.O.:60] Out: -   Examination:  General exam: Alert awake oriented x3, not in acute distress, on room air.   HEENT:Oral mucosa moist, Ear/Nose WNL grossly, dentition normal. Respiratory system: Bilaterally clear breath sounds, no wheezing or crackles,no use of accessory muscle Cardiovascular system: S1 & S2 +, No  JVD,. Gastrointestinal system: Abdomen soft, tender nondistended Nervous System:Alert, awake, moving extremities and grossly nonfocal Extremities: No edema, distal peripheral pulses palpable.  Skin: No rashes,no icterus. MSK: Normal muscle bulk,tone, power NGT+  Data Reviewed: I have personally reviewed following labs and imaging studies  CBC: Recent Labs  Lab 01/26/20 2029 01/27/20 0509 01/28/20 0145 01/29/20 0206  WBC 12.7* 10.0 8.4 5.5  HGB 20.4* 15.3 14.6 13.9  HCT 58.4* 43.6 44.3 41.6  MCV 89.0 90.1 92.9 91.8  PLT 332 214 167 867*   Basic Metabolic Panel: Recent Labs  Lab 01/26/20 2029 01/27/20 0509 01/28/20 0145 01/29/20 0206  NA 134* 134* 134* 137  K 3.1* 2.7* 3.3* 3.5  CL 70* 85* 92* 100  CO2 36* 34* 31 25  GLUCOSE 183* 101* 83 63*  BUN 50* 54* 37* 25*  CREATININE 2.45* 1.90* 1.39* 1.03  CALCIUM 10.1 8.2* 8.8* 8.7*  MG  --   --  1.9  --    GFR: Estimated Creatinine Clearance: 85 mL/min (by C-G formula based on SCr of 1.03 mg/dL). Liver Function Tests: Recent Labs  Lab 01/26/20 2029 01/27/20 0509 01/28/20 0145 01/29/20 0206  AST 37 '23 29 22  ' ALT 49* 30 32 27  ALKPHOS 200* 117 108 99  BILITOT 1.6* 0.9 1.4* 1.9*  PROT 10.3* 6.6 6.4* 6.1*  ALBUMIN 5.5* 3.9 3.7 3.6   Recent Labs  Lab 01/26/20 2029 01/27/20 0509  LIPASE 97* 72*   No results for input(s): AMMONIA in the last 168 hours. Coagulation Profile: No results for input(s): INR, PROTIME in the last 168 hours. Cardiac Enzymes: Recent Labs  Lab 01/26/20 1840  CKTOTAL 32*  CKMB 0.4*   BNP (last 3 results) No results for input(s): PROBNP in the last 8760 hours. HbA1C: No results for input(s): HGBA1C in the last 72 hours. CBG: No results for input(s): GLUCAP in the last 168 hours. Lipid Profile: No results for input(s): CHOL, HDL, LDLCALC, TRIG, CHOLHDL, LDLDIRECT in the last 72 hours. Thyroid Function Tests: No results for input(s): TSH, T4TOTAL, FREET4, T3FREE, THYROIDAB in the  last 72 hours. Anemia Panel: No results for input(s): VITAMINB12, FOLATE, FERRITIN, TIBC, IRON, RETICCTPCT in the last 72 hours. Sepsis Labs: No results for input(s): PROCALCITON, LATICACIDVEN in the last 168 hours.  Recent Results (from the past 240 hour(s))  SARS CORONAVIRUS 2 (TAT 6-24 HRS) Nasopharyngeal Nasopharyngeal Swab     Status: None   Collection Time: 01/26/20 10:11 PM   Specimen: Nasopharyngeal Swab  Result Value Ref Range Status   SARS Coronavirus 2 NEGATIVE NEGATIVE Final    Comment: (NOTE) SARS-CoV-2 target nucleic acids are NOT DETECTED. The SARS-CoV-2 RNA is generally detectable in upper and lower respiratory specimens during the acute phase of infection. Negative results do not preclude SARS-CoV-2 infection, do not rule out co-infections with other pathogens, and should not be used as the sole basis for treatment or other patient management decisions. Negative results must be combined with clinical observations, patient history, and epidemiological information. The expected result is Negative. Fact Sheet for Patients: SugarRoll.be Fact Sheet for Healthcare Providers: https://www.woods-mathews.com/ This test is not yet approved or cleared by the [place]  States FDA and  has been authorized for detection and/or diagnosis of SARS-CoV-2 by FDA under an Emergency Use Authorization (EUA). This EUA will remain  in effect (meaning this test can be used) for the duration of the COVID-19 declaration under Section 56 4(b)(1) of the Act, 21 U.S.C. section 360bbb-3(b)(1), unless the authorization is terminated or revoked sooner. Performed at [facility], [place] [address] [place], [place] [address]       Radiology Studies: DG Loyce Dys Tube Plc W/Fl W/Rad  Result Date: 01/27/2020 CLINICAL DATA:  Gastric outlet obstruction need for nasogastric tube. EXAM: NASO G TUBE PLACEMENT WITH FL AND WITH RAD CONTRAST:  10 cc water-soluble  contrast FLUOROSCOPY TIME:  Fluoroscopy Time:  1 minutes 6 seconds Radiation Exposure Index (if provided by the fluoroscopic device): 4.3 mGy Number of Acquired Spot Images: 0 COMPARISON:  Abdomen pelvis CT 01/26/2020 FINDINGS: Under real-time fluoroscopic guidance a nasogastric tube was advanced into the stomach, tip in the distal body of the stomach. 10 cc of water-soluble contrast was injected to confirm positioning. The stomach appear distended as seen on recent CT and incidental note is made of a pancreatic stent in the right upper quadrant. IMPRESSION: 1. Satisfactory position of nasogastric tube, position verified with fluoroscopy, and with injection of small amount of water-soluble contrast. 2. Gastric distention. 3. Pancreatic stent. Electronically Signed   By: Zetta Bills M.D.   On: 01/27/2020 17:41     LOS: 2 days   Time spent: More than 50% of that time was spent in counseling and/or coordination of care.  Antonieta Pert, MD [facility]  01/29/2020, 10:43 AM

## 2020-01-29 NOTE — Transfer of Care (Signed)
Immediate Anesthesia Transfer of Care Note  Patient: Paul Allen  Procedure(s) Performed: ESOPHAGOGASTRODUODENOSCOPY (EGD) WITH PROPOFOL (N/A ) BIOPSY  Patient Location: PACU and Endoscopy Unit  Anesthesia Type:General  Level of Consciousness: awake, alert  and oriented  Airway & Oxygen Therapy: Patient Spontanous Breathing and Patient connected to face mask oxygen  Post-op Assessment: Report given to RN and Post -op Vital signs reviewed and stable  Post vital signs: Reviewed and stable  Last Vitals:  Vitals Value Taken Time  BP    Temp    Pulse 64 01/29/20 1353  Resp 14 01/29/20 1353  SpO2 100 % 01/29/20 1353  Vitals shown include unvalidated device data.  Last Pain:  Vitals:   01/29/20 1128  TempSrc: Oral  PainSc: 6       Patients Stated Pain Goal: 3 (01/29/20 7711)  Complications: No apparent anesthesia complications

## 2020-01-29 NOTE — Progress Notes (Signed)
ANTICOAGULATION CONSULT NOTE - Follow Up Consult  Pharmacy Consult for Heparin Indication: Portal Vein Thrombosis  Allergies  Allergen Reactions  . Penicillins Rash    Has patient had a PCN reaction causing immediate rash, facial/tongue/throat swelling, SOB or lightheadedness with hypotension: yes Has patient had a PCN reaction causing severe rash involving mucus membranes or skin necrosis: Yes Has patient had a PCN reaction that required hospitalization: No Has patient had a PCN reaction occurring within the last 10 years: No If all of the above answers are "NO", then may proceed with Cephalosporin use.     Patient Measurements: Height: 5\' 9"  (175.3 cm) Weight: 137 lb 8 oz (62.4 kg) IBW/kg (Calculated) : 70.7 Heparin Dosing Weight:   Vital Signs: Temp: 98.3 F (36.8 C) (02/10 2046) Temp Source: Oral (02/10 2046) BP: 135/72 (02/10 2046) Pulse Rate: 54 (02/10 2046)  Labs: Recent Labs    01/27/20 0509 01/27/20 1926 01/28/20 0145 01/28/20 1048 01/28/20 1914 01/29/20 0206 01/29/20 2143  HGB 15.3  --  14.6  --   --  13.9  --   HCT 43.6  --  44.3  --   --  41.6  --   PLT 214  --  167  --   --  135*  --   APTT  --    < > 85*   < > 51* 109* 56*  HEPARINUNFRC  --    < > 1.68*  --   --  0.75* 0.30  CREATININE 1.90*  --  1.39*  --   --  1.03  --    < > = values in this interval not displayed.    Estimated Creatinine Clearance: 85 mL/min (by C-G formula based on SCr of 1.03 mg/dL).   Medications:  Infusions:  . heparin 900 Units/hr (01/29/20 1601)  . lactated ringers with kcl 75 mL/hr at 01/29/20 1557    Assessment: Patient with heparin level at goal but lowest end of goal.  No heparin issues noted.  Goal of Therapy:  Heparin level 0.3-0.7 units/ml Monitor platelets by anticoagulation protocol: Yes   Plan:  Increase heparin to 1000 units/hr Recheck heparin level at 0800  03/28/20, Darlina Guys Crowford 01/29/2020,11:31 PM

## 2020-01-29 NOTE — Interval H&P Note (Signed)
History and Physical Interval Note:  01/29/2020 1:16 PM  Paul Allen  has presented today for surgery, with the diagnosis of stent removal/possible stent placement.  The various methods of treatment have been discussed with the patient and family. After consideration of risks, benefits and other options for treatment, the patient has consented to  Procedure(s): ENDOSCOPIC RETROGRADE CHOLANGIOPANCREATOGRAPHY (ERCP) (N/A) as a surgical intervention.  The patient's history has been reviewed, patient examined, no change in status, stable for surgery.  I have reviewed the patient's chart and labs.  Questions were answered to the patient's satisfaction.     Rachael Fee

## 2020-01-29 NOTE — Op Note (Signed)
[facility] Patient Name: Paul Allen Procedure Date: 01/29/2020 MRN: [id number] Attending MD: Rachael Fee , MD Date of Birth: 1981/06/08 CSN: [id number] Age: 39 Admit Type: Outpatient Procedure:                Upper GI endoscopy Indications:              Severe pancreatitis recently with disrupted main                            pancreatic duct, currently with PD stent in place,                            fluid collections/pancreatic ascites has completely                            resolved however now he has vomiting and gastric                            outlet obstruction on imaging Providers:                Rachael Fee, Dwain Sarna, RN, Wanita Chamberlain,                            Technician, Maricela Curet, CRNA, Rachael Fee, MD Referring MD:              Medicines:                General Anesthesia Complications:            No immediate complications. Estimated blood loss:                            None. Estimated Blood Loss:     Estimated blood loss: none. Procedure:                Pre-Anesthesia Assessment:                           - Prior to the procedure, a History and Physical                            was performed, and patient medications and                            allergies were reviewed. The patient's tolerance of                            previous anesthesia was also reviewed. The risks                            and benefits of the procedure and the sedation                            options and risks were discussed with the patient.  All questions were answered, and informed consent                            was obtained. Prior Anticoagulants: The patient has                            taken Eliquis (apixaban), last dose was 4 days                            prior to procedure. ASA Grade Assessment: II - A                            patient with mild systemic disease. After reviewing            the risks and benefits, the patient was deemed in                            satisfactory condition to undergo the procedure.                           After obtaining informed consent, the endoscope was                            passed under direct vision. Throughout the                            procedure, the patient's blood pressure, pulse, and                            oxygen saturations were monitored continuously. The                            GIF-H190 ([id number]) Olympus gastroscope was                            introduced through the mouth, and advanced to the                            prepyloric region, stomach. After obtaining                            informed consent, the endoscope was passed under                            direct vision. Throughout the procedure, the                            patient's blood pressure, pulse, and oxygen                            saturations were monitored continuously. The upper                            GI endoscopy was accomplished without difficulty.  The patient tolerated the procedure well. Scope In: Scope Out: Findings:      His NG tube was removed during the procedure.      Severe, ulcerative, reflux related esophagitis (LA Grade D).      There was mild, non-specific gastritis in the distal stomach, biopsies       taken to check for H. pylori. More proximally the gastric mucosa had a       snakeskin appearance similary to portal gastropathy changes.      The pylorus was extrement edematous and stenotic. I was unable to       advance the adult gastroscope through the 1-2cm lumen.      The examination was otherwise normal. Impression:               - Gastric outlet obstruction at the level of the                            pylorus which is edematous, inflammatory, congested                            and stenotic.                           - Distal gastritis (biopsied to check for H.                             pylori) and possible portal gastropathy changes in                            proximal stomach.                           - Severe acid related distal esophagitis, due to                            vomiting from the outlet obstruction. Moderate Sedation:      Not Applicable - Patient had care per Anesthesia. Recommendation:           - Return patient to hospital ward for ongoing care.                           - Continue IV PPI for another 24 hours.                           - Trial of full liquids, ensure TID. If he fails                            this trial he will need to consider J tube                            (surgically placed likely).                           - OK to resume heparin now. Procedure Code(s):        --- Professional ---  43239, 52, Esophagogastroduodenoscopy, flexible,                            transoral; with biopsy, single or multiple Diagnosis Code(s):        --- Professional ---                           K29.70, Gastritis, unspecified, without bleeding                           R11.10, Vomiting, unspecified CPT copyright 2019 American Medical Association. All rights reserved. The codes documented in this report are preliminary and upon coder review may  be revised to meet current compliance requirements. Milus Banister, MD 01/29/2020 2:08:15 PM This report has been signed electronically. Number of Addenda: 0

## 2020-01-29 NOTE — Anesthesia Procedure Notes (Signed)
Procedure Name: Intubation Date/Time: 01/29/2020 1:25 PM Performed by: Orest Dikes, CRNA Pre-anesthesia Checklist: Patient identified, Emergency Drugs available, Suction available and Patient being monitored Patient Re-evaluated:Patient Re-evaluated prior to induction Oxygen Delivery Method: Circle system utilized Preoxygenation: Pre-oxygenation with 100% oxygen Induction Type: IV induction, Rapid sequence and Cricoid Pressure applied Laryngoscope Size: Miller and 4 Grade View: Grade I Tube type: Oral Tube size: 7.5 mm Number of attempts: 1 Airway Equipment and Method: Stylet Placement Confirmation: ETT inserted through vocal cords under direct vision,  positive ETCO2 and breath sounds checked- equal and bilateral Secured at: 22 cm Tube secured with: Tape Dental Injury: Teeth and Oropharynx as per pre-operative assessment

## 2020-01-29 NOTE — Progress Notes (Signed)
ANTICOAGULATION CONSULT NOTE - Initial Consult  Pharmacy Consult for Heparin Indication: Portal vein thrombosis  Allergies  Allergen Reactions  . Penicillins Rash    Has patient had a PCN reaction causing immediate rash, facial/tongue/throat swelling, SOB or lightheadedness with hypotension: yes Has patient had a PCN reaction causing severe rash involving mucus membranes or skin necrosis: Yes Has patient had a PCN reaction that required hospitalization: No Has patient had a PCN reaction occurring within the last 10 years: No If all of the above answers are "NO", then may proceed with Cephalosporin use.     Patient Measurements: Height: 5\' 9"  (175.3 cm) Weight: 137 lb 8 oz (62.4 kg) IBW/kg (Calculated) : 70.7 Heparin Dosing Weight: n/a. Use TBW = 62 kg  Vital Signs: Temp: 98.8 F (37.1 C) (02/10 1128) Temp Source: Oral (02/10 1128) BP: 128/70 (02/10 1410) Pulse Rate: 57 (02/10 1410)  Labs: Recent Labs    01/26/20 1840 01/26/20 2029 01/27/20 0509 01/27/20 0509 01/27/20 1926 01/28/20 0145 01/28/20 0145 01/28/20 1048 01/28/20 1914 01/29/20 0206  HGB  --    < > 15.3   < >  --  14.6  --   --   --  13.9  HCT  --    < > 43.6  --   --  44.3  --   --   --  41.6  PLT  --    < > 214  --   --  167  --   --   --  135*  APTT  --   --   --    < > 32 85*   < > 155* 51* 109*  HEPARINUNFRC  --   --   --   --  1.48* 1.68*  --   --   --  0.75*  CREATININE  --    < > 1.90*  --   --  1.39*  --   --   --  1.03  CKTOTAL 32*  --   --   --   --   --   --   --   --   --   CKMB 0.4*  --   --   --   --   --   --   --   --   --    < > = values in this interval not displayed.    Estimated Creatinine Clearance: 85 mL/min (by C-G formula based on SCr of 1.03 mg/dL).   Medical History: Past Medical History:  Diagnosis Date  . Abdominal pain   . Mesenteric vein thrombosis ([place])    Dec 2020 in setting of pancreatitis  . Nausea & vomiting   . Pancreatitis     Medications:  PTA Eliquis  5mg  BID - LD 01/27/20 @ 05:47  Assessment: 41yr male admitted with nausea/vomiting/abdominal pain;acute pancreatitis. PMH significant for alcoholic pancreatitis and portal vein thrombosis (on Eliquis PTA). Pt currently NPO, GI consulted. Eliquis being held, pharmacy consulted to dose/monitor IV heparin.  Baseline labs: -CBC: Hgb 15.3, Plt 214 -HL 1.48, falsely elevated due to Eliquis  Significant Events:  -EGD on 2/10  Today, 01/29/20  APTT = 109 seconds overnight was slightly supratherapeutic on heparin infusion of 950 units/hr. HL remains elevated due to DOAC.  Heparin turned off at 0630 this morning in preparation for EGD by GI today.   CBC: Hgb WNL & stable, Plt 135 slightly low, decreased  Per GI op note, ok to resume heparin now. Discussed  with hospitalist - will restart.  Goal of Therapy:  Heparin level 0.3-0.7 units/ml aPTT 66-102 seconds Monitor platelets by anticoagulation protocol: Yes   Plan:   Resume heparin infusion at 900 units/hr  Check 6 hour aPTT and HL  Monitor aPTT until aPTT and heparin level correlate. Once correlating, can monitor using HL only.  Daily CBC and heparin level while on IV heparin  Follow along for ability to resume PO anticoagulation  Cindi Carbon, PharmD 01/29/2020,2:44 PM

## 2020-01-30 ENCOUNTER — Encounter: Payer: Self-pay | Admitting: *Deleted

## 2020-01-30 ENCOUNTER — Other Ambulatory Visit: Payer: Self-pay

## 2020-01-30 LAB — COMPREHENSIVE METABOLIC PANEL
ALT: 23 U/L (ref 0–44)
AST: 20 U/L (ref 15–41)
Albumin: 3.4 g/dL — ABNORMAL LOW (ref 3.5–5.0)
Alkaline Phosphatase: 90 U/L (ref 38–126)
Anion gap: 10 (ref 5–15)
BUN: 17 mg/dL (ref 6–20)
CO2: 25 mmol/L (ref 22–32)
Calcium: 8.5 mg/dL — ABNORMAL LOW (ref 8.9–10.3)
Chloride: 100 mmol/L (ref 98–111)
Creatinine, Ser: 0.87 mg/dL (ref 0.61–1.24)
GFR calc Af Amer: >60 mL/min (ref >60)
GFR calc non Af Amer: >60 mL/min (ref >60)
Glucose, Bld: 105 mg/dL — ABNORMAL HIGH (ref 70–99)
Potassium: 4.1 mmol/L (ref 3.5–5.1)
Sodium: 135 mmol/L (ref 135–145)
Total Bilirubin: 0.9 mg/dL (ref 0.3–1.2)
Total Protein: 5.9 g/dL — ABNORMAL LOW (ref 6.5–8.1)

## 2020-01-30 LAB — CBC
HCT: 39.6 % (ref 39.0–52.0)
Hemoglobin: 13.2 g/dL (ref 13.0–17.0)
MCH: 30.6 pg (ref 26.0–34.0)
MCHC: 33.3 g/dL (ref 30.0–36.0)
MCV: 91.7 fL (ref 80.0–100.0)
Platelets: 144 10*3/uL — ABNORMAL LOW (ref 150–400)
RBC: 4.32 MIL/uL (ref 4.22–5.81)
RDW: 13.1 % (ref 11.5–15.5)
WBC: 4.1 10*3/uL (ref 4.0–10.5)
nRBC: 0 % (ref 0.0–0.2)

## 2020-01-30 LAB — HEPARIN LEVEL (UNFRACTIONATED)
Heparin Unfractionated: 0.32 IU/mL (ref 0.30–0.70)
Heparin Unfractionated: 0.24 IU/mL — ABNORMAL LOW (ref 0.30–0.70)

## 2020-01-30 LAB — SURGICAL PATHOLOGY

## 2020-01-30 MED ORDER — HEPARIN (PORCINE) 25000 UT/250ML-% IV SOLN
1200.0000 [IU]/h | INTRAVENOUS | Status: DC
  Administered 2020-01-30: 1050 [IU]/h via INTRAVENOUS
  Administered 2020-01-31: 1200 [IU]/h via INTRAVENOUS
  Filled 2020-01-30 (×2): qty 250

## 2020-01-30 NOTE — Progress Notes (Signed)
PROGRESS NOTE    Paul Allen  RJJ:[id number] DOB: 01-Jan-1981 DOA: 01/26/2020 PCP: Kerin Perna, NP   Brief Narrative: 37 yom history of alcoholic pancreatitis/portal vein thrombosis on Eliquis presents with nausea vomiting generalized abdominal pain 3 days prior to admission on 2/7.  Patient is trying liquid diet but pain got worse denies using alcohol recently.  Work-up in the ED showed CT scan with gastric distention uncertain etiology sequelae associated with gastric outlet obstruction could not be excluded, stable pancreatic duct stent positioning, sigmoid diverticulosis.  Labs showed acute renal failure, elevated BUN, patient was admitted on IV fluids IV pain medication and GI was consulted.  Recent multiple admissions/ED or pcpc visits on 11/28/19, 12/02/2019, 01/02/2020, 01/07/2020. Patient was seen by GI managing NG tube n.p.o. IV fluids pain medication Protonix IV.  Subsequently underwent endoscopy 2/10-found to have gastric outlet obstruction at the level of pylorus with edematous inflammatory congestion and stenotic, distal gastritis.  Subjective: Reports she tried some broth yesterday was having nausea but did not vomit ," pushed hard to keep it down, almost vomited".  This morning ate 30% of his soup and half of the grit-having some nausea but no vomiting. Overnight no acute events.  Afebrile.  On room air.  CBC CMP stable.   Assessment & Plan:  Nausea & vomiting due to gastric outlet obstruction: Intractable nausea vomiting leading to acute renal failure severe dehydration.  Managed with n.p.o. IV fluids NG tube.  NG tube removed during endoscopy 2/10- which showed, gastric outlet obstruction at the level of pylorus with edematous inflammatory congestion and stenotic, distal gastritis . Started full liquid diet 2/10-we will continue on liquid diet, protein calorie nutrition supplement and  monitor for few days in the hospital - if not tolerating then consult surgery for  J-tube.  Appreciate GI input on board.  Hypokalemia: Resolved.    ARF/AKI: Suspecting prerenal/volume depletion due to #1.  Renal functions improved to baseline receive IV fluid hydration NG tube.Continue gentle IV fluid hydration.    Abnormal LFTs- acute hep panel negative, suspecting component of inflammation edema.  LFTs have normalized.   History of portal vein thrombosis/chronic superior mesenteric on Eliquis currently transitioned to heparin gtt while npo- hopefully transition to Eliquis once okay with gastroenterology and once tolerating diet and if surgery is not planned.   Nutrition: Nutrition Problem: Inadequate oral intake Etiology: inability to eat Signs/Symptoms: NPO status Interventions: Refer to RD note for recommendations   Body mass index is 21.04 kg/m.   DVT prophylaxis:Heparin gtt Code Status:full Family Communication: plan of care discussed with patient at bedside. Disposition Plan:Patient is from: Home       Anticipated d/c to: Home/TBD, 1-2 days       Barriers to d/c or conditions that needs to be met prior to d/c: Remains inpatient for diet challenge-if fails will need J-tube placement  Consultants:GI Procedures:  egd 2/10 Impression: - Gastric outlet obstruction at the level of the pylorus which is edematous, inflammatory, congested and stenotic. - Distal gastritis (biopsied to check for H. pylori) and possible portal gastropathy changes in proximal stomach. Recommendations Return patient to hospital ward for ongoing care. - Continue IV PPI for another 24 hours. - Trial of full liquids, ensure TID. If he fails this trial he will need to consider J tube (surgically placed likely). - OK to resume heparin now.  Microbiology:see note Antimicrobials: Anti-infectives (From admission, onward)   None      Medications: Scheduled Meds: . feeding supplement (ENSURE  ENLIVE)  237 mL Oral TID BM  . folic acid  1 mg Oral Daily  . pantoprazole (PROTONIX)  IV  40 mg Intravenous Q12H  . thiamine  100 mg Oral Daily   Continuous Infusions: . heparin 1,000 Units/hr (01/30/20 0003)  . lactated ringers with kcl 75 mL/hr at 01/30/20 0341    Objective: Vitals:   01/29/20 1410 01/29/20 1440 01/29/20 2046 01/30/20 0609  BP: 128/70 133/78 135/72 (!) 140/92  Pulse: (!) 57 (!) 57 (!) 54 (!) 48  Resp: (!) _0 Temp:  (!) 97.5 F (36.4 C) 98.3 F (36.8 C) (!) 97.4 F (36.3 C)  TempSrc:  Oral Oral Oral  SpO2: 100% 100% 100% 100%  Weight:    64.6 kg  Height:        Intake/Output Summary (Last 24 hours) at 01/30/2020 0806 Last data filed at 01/30/2020 0610 Gross per 24 hour  Intake 2411.56 ml  Output 1300 ml  Net 1111.56 ml   Filed Weights   01/28/20 0556 01/29/20 0456 01/30/20 0609  Weight: 62.5 kg 62.4 kg 64.6 kg   Weight change: 2.268 kg  Body mass index is 21.04 kg/m.  Intake/Output from previous day: 02/10 0701 - 02/11 0700 In: 2411.6 [P.O.:300; I.V.:2111.6] Out: 1300 [Urine:1300] Intake/Output this shift: No intake/output data recorded.  Examination: General exam: AAO 3, NAD, weak appearing. HEENT:Oral mucosa moist, Ear/Nose WNL grossly. Respiratory system: bilaterally clear, no wheezing or crackles,no use of accessory muscle Cardiovascular system: S1 & S2 +, No JVD,. Gastrointestinal system: Abdomen soft, mildly tender, not distended, BS+ Nervous System:Alert, awake, moving extremities and grossly nonfocal Extremities: No edema, distal peripheral pulses palpable.  Skin: No rashes,no icterus. MSK: Normal muscle bulk,tone, power  Data Reviewed: I have personally reviewed following labs and imaging studies  CBC: Recent Labs  Lab 01/26/20 2029 01/27/20 0509 01/28/20 0145 01/29/20 0206 01/30/20 0455  WBC 12.7* 10.0 8.4 5.5 4.1  HGB 20.4* 15.3 14.6 13.9 13.2  HCT 58.4* 43.6 44.3 41.6 39.6  MCV 89.0 90.1 92.9 91.8 91.7  PLT 332 214 167 135* 389*   Basic Metabolic Panel: Recent Labs  Lab 01/26/20 2029  01/27/20 0509 01/28/20 0145 01/29/20 0206 01/30/20 0455  NA 134* 134* 134* 137 135  K 3.1* 2.7* 3.3* 3.5 4.1  CL 70* 85* 92* 100 100  CO2 36* 34* _1 GLUCOSE 183* 101* 83 63* 105*  BUN 50* 54* 37* 25* 17  CREATININE 2.45* 1.90* 1.39* 1.03 0.87  CALCIUM 10.1 8.2* 8.8* 8.7* 8.5*  MG  --   --  1.9  --   --    GFR: Estimated Creatinine Clearance: 104.2 mL/min (by C-G formula based on SCr of 0.87 mg/dL). Liver Function Tests: Recent Labs  Lab 01/26/20 2029 01/27/20 0509 01/28/20 0145 01/29/20 0206 01/30/20 0455  AST 37 _2 ALT 49* 30 32 27 23  ALKPHOS 200* 117 108 99 90  BILITOT 1.6* 0.9 1.4* 1.9* 0.9  PROT 10.3* 6.6 6.4* 6.1* 5.9*  ALBUMIN 5.5* 3.9 3.7 3.6 3.4*   Recent Labs  Lab 01/26/20 2029 01/27/20 0509  LIPASE 97* 72*   No results for input(s): AMMONIA in the last 168 hours. Coagulation Profile: No results for input(s): INR, PROTIME in the last 168 hours. Cardiac Enzymes: Recent Labs  Lab 01/26/20 1840  CKTOTAL 32*  CKMB 0.4*   BNP (last 3 results) No results for input(s): PROBNP in the last 8760 hours. HbA1C: No results for input(s):  HGBA1C in the last 72 hours. CBG: No results for input(s): GLUCAP in the last 168 hours. Lipid Profile: No results for input(s): CHOL, HDL, LDLCALC, TRIG, CHOLHDL, LDLDIRECT in the last 72 hours. Thyroid Function Tests: No results for input(s): TSH, T4TOTAL, FREET4, T3FREE, THYROIDAB in the last 72 hours. Anemia Panel: No results for input(s): VITAMINB12, FOLATE, FERRITIN, TIBC, IRON, RETICCTPCT in the last 72 hours. Sepsis Labs: No results for input(s): PROCALCITON, LATICACIDVEN in the last 168 hours.  Recent Results (from the past 240 hour(s))  SARS CORONAVIRUS 2 (TAT 6-24 HRS) Nasopharyngeal Nasopharyngeal Swab     Status: None   Collection Time: 01/26/20 10:11 PM   Specimen: Nasopharyngeal Swab  Result Value Ref Range Status   SARS Coronavirus 2 NEGATIVE NEGATIVE Final    Comment:  (NOTE) SARS-CoV-2 target nucleic acids are NOT DETECTED. The SARS-CoV-2 RNA is generally detectable in upper and lower respiratory specimens during the acute phase of infection. Negative results do not preclude SARS-CoV-2 infection, do not rule out co-infections with other pathogens, and should not be used as the sole basis for treatment or other patient management decisions. Negative results must be combined with clinical observations, patient history, and epidemiological information. The expected result is Negative. Fact Sheet for Patients: SugarRoll.be Fact Sheet for Healthcare Providers: https://www.woods-mathews.com/ This test is not yet approved or cleared by the [place] FDA and  has been authorized for detection and/or diagnosis of SARS-CoV-2 by FDA under an Emergency Use Authorization (EUA). This EUA will remain  in effect (meaning this test can be used) for the duration of the COVID-19 declaration under Section 56 4(b)(1) of the Act, 21 U.S.C. section 360bbb-3(b)(1), unless the authorization is terminated or revoked sooner. Performed at [facility], [place] [address], [place], [place] [address]       Radiology Studies: No results found.   LOS: 3 days   Time spent: More than 50% of that time was spent in counseling and/or coordination of care.  Antonieta Pert, MD [facility]  01/30/2020, 8:06 AM

## 2020-01-30 NOTE — Progress Notes (Signed)
ANTICOAGULATION CONSULT NOTE  Pharmacy Consult for Heparin Indication: Portal vein thrombosis  Allergies  Allergen Reactions  . Penicillins Rash    Has patient had a PCN reaction causing immediate rash, facial/tongue/throat swelling, SOB or lightheadedness with hypotension: yes Has patient had a PCN reaction causing severe rash involving mucus membranes or skin necrosis: Yes Has patient had a PCN reaction that required hospitalization: No Has patient had a PCN reaction occurring within the last 10 years: No If all of the above answers are "NO", then may proceed with Cephalosporin use.     Patient Measurements: Height: 5\' 9"  (175.3 cm) Weight: 142 lb 8 oz (64.6 kg) IBW/kg (Calculated) : 70.7 Heparin Dosing Weight: total body weight  Vital Signs: Temp: 97.4 F (36.3 C) (02/11 0609) Temp Source: Oral (02/11 0609) BP: 140/92 (02/11 0609) Pulse Rate: 48 (02/11 0609)  Labs: Recent Labs    01/28/20 0145 01/28/20 0145 01/28/20 1048 01/28/20 1914 01/29/20 0206 01/29/20 2143 01/30/20 0455 01/30/20 0830  HGB 14.6   < >  --   --  13.9  --  13.2  --   HCT 44.3  --   --   --  41.6  --  39.6  --   PLT 167  --   --   --  135*  --  144*  --   APTT 85*  --    < > 51* 109* 56*  --   --   HEPARINUNFRC 1.68*   < >  --   --  0.75* 0.30  --  0.32  CREATININE 1.39*  --   --   --  1.03  --  0.87  --    < > = values in this interval not displayed.    Estimated Creatinine Clearance: 104.2 mL/min (by C-G formula based on SCr of 0.87 mg/dL).   Medical History: Past Medical History:  Diagnosis Date  . Abdominal pain   . Mesenteric vein thrombosis (HCC)    Dec 2020 in setting of pancreatitis  . Nausea & vomiting   . Pancreatitis     Medications:  PTA Eliquis 5mg  BID - LD 01/27/20 @ 05:47  Assessment: 39 y/o male admitted with nausea/vomiting/abdominal pain;acute pancreatitis. PMH significant for alcoholic pancreatitis and portal vein thrombosis (on Eliquis PTA). Pt currently NPO, GI  consulted. Eliquis being held, pharmacy consulted to dose/monitor IV heparin.  Baseline labs: -CBC: Hgb 15.3, Plt 214 -HL 1.48, falsely elevated due to Eliquis  Significant Events:  -EGD on 2/10, heparin resumed post-procedure  Today, 01/30/20  Heparin level = 0.32 units/mL, remains on low end of therapeutic range   aPTT and heparin level correlating, so using heparin levels for dosing moving forward   CBC: Hgb WNL. Pltc 144K, slightly low but improved   No bleeding issues reported per nursing  RN reports patient keeps bending arm where heparin is infusing. She has taped up the area and will consider moving IV site if continues to be problematic.   Goal of Therapy:  Heparin level 0.3-0.7 units/ml Monitor platelets by anticoagulation protocol: Yes   Plan:   Increase heparin infusion slightly to 1050 units/hr  Heparin level in 6 hours  Daily CBC and heparin level while on IV heparin  Follow along for ability to resume PO anticoagulation   4/10, PharmD, BCPS Clinical Pharmacist  01/30/2020,10:01 AM

## 2020-01-30 NOTE — Progress Notes (Signed)
ANTICOAGULATION CONSULT NOTE  Pharmacy Consult for Heparin Indication: Portal vein thrombosis  Allergies  Allergen Reactions  . Penicillins Rash    Has patient had a PCN reaction causing immediate rash, facial/tongue/throat swelling, SOB or lightheadedness with hypotension: yes Has patient had a PCN reaction causing severe rash involving mucus membranes or skin necrosis: Yes Has patient had a PCN reaction that required hospitalization: No Has patient had a PCN reaction occurring within the last 10 years: No If all of the above answers are "NO", then may proceed with Cephalosporin use.     Patient Measurements: Height: 5\' 9"  (175.3 cm) Weight: 142 lb 8 oz (64.6 kg) IBW/kg (Calculated) : 70.7 Heparin Dosing Weight: total body weight  Vital Signs: Temp: 97.5 F (36.4 C) (02/11 1342) Temp Source: Oral (02/11 1342) BP: 145/84 (02/11 1342) Pulse Rate: 51 (02/11 1342)  Labs: Recent Labs    01/28/20 0145 01/28/20 0145 01/28/20 1048 01/28/20 1914 01/29/20 0206 01/29/20 2143 01/30/20 0455 01/30/20 0830  HGB 14.6   < >  --   --  13.9  --  13.2  --   HCT 44.3  --   --   --  41.6  --  39.6  --   PLT 167  --   --   --  135*  --  144*  --   APTT 85*  --    < > 51* 109* 56*  --   --   HEPARINUNFRC 1.68*   < >  --   --  0.75* 0.30  --  0.32  CREATININE 1.39*  --   --   --  1.03  --  0.87  --    < > = values in this interval not displayed.    Estimated Creatinine Clearance: 104.2 mL/min (by C-G formula based on SCr of 0.87 mg/dL).  Medications:  PTA Eliquis 5mg  BID - LD 01/27/20 @ 05:47  Assessment: 38 y/o male admitted with nausea/vomiting/abdominal pain;acute pancreatitis. PMH significant for alcoholic pancreatitis and portal vein thrombosis (on Eliquis PTA). Pt currently NPO, GI consulted. Eliquis being held, pharmacy consulted to dose/monitor IV heparin.  Baseline labs: CBC: Hgb 15.3, Plt 214 HL 1.48, falsely elevated due to Eliquis  Significant Events:  2/10 - EGD;  heparin resumed post-procedure  Today, 01/30/2020:  Heparin level now slightly subtherapeutic on 1050 units/hr; infusing well per RN  CBC: Hgb WNL. Pltc 144K, slightly low but improved   No bleeding issues reported per nursing  Goal of Therapy:  Heparin level 0.3-0.7 units/ml Monitor platelets by anticoagulation protocol: Yes   Plan:   Increase heparin infusion to 1200 units/hr  Recheck heparin level with AM labs  Daily CBC and heparin level while on IV heparin  Follow along for ability to resume PO anticoagulation  4/10, PharmD, BCPS [phone] 01/30/2020, 3:05 PM

## 2020-01-30 NOTE — Progress Notes (Addendum)
Nutrition Follow-up  RD working remotely.   DOCUMENTATION CODES:   Not applicable  INTERVENTION:  - continue Ensure Enlive but will decrease from TID to BID, each supplement provides 350 kcal and 20 grams of protein. - will order 30 mL Prostat once/day, each supplement provides 100 kcal and 15 grams of protein. - will order Magic Cup BID with meals, each supplement provides 290 kcal and 9 grams of protein. - continue to encourage PO intakes and advance diet as medically feasible.    NUTRITION DIAGNOSIS:   Increased nutrient needs related to acute illness as evidenced by estimated needs. -revised  GOAL:   Patient will meet greater than or equal to 90% of their needs -unmet with current diet order  MONITOR:   PO intake, Supplement acceptance, Diet advancement, Labs, Weight trends  ASSESSMENT:   39 year old male with medical history of alcoholic pancreatitis and portal vein thrombosis on Eliquis. He presented to the ED with N/V and abdominal pain since 2/4. He denies drinking alcohol for several months. CT showed severe gastric distention with unclear etiology, stable pancreatic stent positioning, and sigmoid diverticulosis.  NGT was placed at 1745 on 2/8 and removed at 1340 on 2/10. Diet advanced from NPO to FLD on 2/10 at 1450 with no documented intakes since that time. Weight on 2/8 was 134 and today is 142 lb; will continue to monitor closely. Patient at high risk for malnutrition. Ensure Enlive ordered TID and he has accepted 2 of 3 bottles of the supplement offered to him.   If patient consumes 100% of ordered supplements, he will take in 1380 kcal (65% estimated kcal need) and 73 grams protein (69% estimated protein need).   Per notes: - ongoing nausea with PO intakes and ongoing abdominal pain (spasms) - gastric outlet obstruction--thought to be 2/2 severe pancreatic inflammation, residual from dx of severe pancreatitis several months ago - possible need for  J-tube   Labs reviewed; Ca: 8.5 mg/dl. Medications reviewed; 1 mg folvite/day, 40 mg IV protonix BID, 100 mg oral thiamine/day.  IVF; LR-20 mEq KCl @ 75 ml/hr.     NUTRITION - FOCUSED PHYSICAL EXAM:  unable to complete at this time.   Diet Order:   Diet Order            Diet full liquid Room service appropriate? Yes; Fluid consistency: Thin  Diet effective now              EDUCATION NEEDS:   No education needs have been identified at this time  Skin:  Skin Assessment: Reviewed RN Assessment  Last BM:  2/10  Height:   Ht Readings from Last 1 Encounters:  01/27/20 5\' 9"  (1.753 m)    Weight:   Wt Readings from Last 1 Encounters:  01/30/20 64.6 kg    Ideal Body Weight:  72.7 kg  BMI:  Body mass index is 21.04 kg/m.  Estimated Nutritional Needs:   Kcal:  2135-2400 kcal  Protein:  105-120 grams  Fluid:  >/= 2.2 L/day     01-17-1983, MS, RD, LDN, CNSC Inpatient Clinical Dietitian RD pager # available in [facility]  After hours/weekend pager # available in [facility]

## 2020-01-30 NOTE — Progress Notes (Signed)
[facility] Progress Note  Chief Complaint: Gastric outlet obstruction  History:  Paul Allen feels about the same today.  He has had a modest amount of full liquids and some boost protein calorie drinks.  It causes nausea, no vomiting yet.  He gets spasms of upper abdominal pain. He was aware of upper endoscopy findings from his discussion with Dr. Ardis Hughs yesterday, but I had a much longer discussion with him today about the potential cause and our proposed plan.  ROS: Cardiovascular: Denies chest pain Respiratory: Denies dyspnea Urinary: Denies dysuria  Objective:   Current Facility-Administered Medications:  .  feeding supplement (ENSURE ENLIVE) (ENSURE ENLIVE) liquid 237 mL, 237 mL, Oral, TID BM, Milus Banister, MD, 237 mL at 01/30/20 0941 .  folic acid (FOLVITE) tablet 1 mg, 1 mg, Oral, Daily, Milus Banister, MD, 1 mg at 01/30/20 0942 .  heparin ADULT infusion 100 units/mL (25000 units/234mL sodium chloride 0.45%), 1,050 Units/hr, Intravenous, Continuous, Luiz Ochoa, RPH .  HYDROmorphone (DILAUDID) injection 0.5 mg, 0.5 mg, Intravenous, Q4H PRN, Milus Banister, MD, 0.5 mg at 01/30/20 0644 .  lactated ringers 1,000 mL with potassium chloride 20 mEq infusion, , Intravenous, Continuous, Milus Banister, MD, Last Rate: 75 mL/hr at 01/30/20 0341, New Bag at 01/30/20 0341 .  ondansetron (ZOFRAN) injection 4 mg, 4 mg, Intravenous, Q6H PRN, Milus Banister, MD, 4 mg at 01/30/20 0644 .  pantoprazole (PROTONIX) injection 40 mg, 40 mg, Intravenous, Q12H, Milus Banister, MD, 40 mg at 01/30/20 0941 .  phenol (CHLORASEPTIC) mouth spray 1 spray, 1 spray, Mouth/Throat, PRN, Milus Banister, MD .  promethazine (PHENERGAN) injection 12.5 mg, 12.5 mg, Intravenous, Q6H PRN, Milus Banister, MD, 12.5 mg at 01/27/20 0340 .  thiamine tablet 100 mg, 100 mg, Oral, Daily, Milus Banister, MD, 100 mg at 01/30/20 0942  . heparin    . lactated ringers with kcl 75 mL/hr at 01/30/20 0341     Vital  signs in last 24 hrs: Vitals:   01/29/20 2046 01/30/20 0609  BP: 135/72 (!) 140/92  Pulse: (!) 54 (!) 48  Resp: 18 18  Temp: 98.3 F (36.8 C) (!) 97.4 F (36.3 C)  SpO2: 100% 100%    Intake/Output Summary (Last 24 hours) at 01/30/2020 1017 Last data filed at 01/30/2020 0610 Gross per 24 hour  Intake 2351.56 ml  Output 1300 ml  Net 1051.56 ml    IV heparin running  Physical Exam Laying in bed, fatigued, alert and oriented  HEENT: sclera anicteric, oral mucosa without lesions  Neck: supple, no thyromegaly, JVD or lymphadenopathy  Cardiac: RRR without murmurs, S1S2 heard, no peripheral edema  Pulm: clear to auscultation bilaterally, normal RR and effort noted  Abdomen: soft, upper tenderness, with active bowel sounds. No guarding or palpable hepatosplenomegaly  Skin; warm and dry, no jaundice  Recent Labs:  CBC Latest Ref Rng & Units 01/30/2020 01/29/2020 01/28/2020  WBC 4.0 - 10.5 K/uL 4.1 5.5 8.4  Hemoglobin 13.0 - 17.0 g/dL 13.2 13.9 14.6  Hematocrit 39.0 - 52.0 % 39.6 41.6 44.3  Platelets 150 - 400 K/uL 144(L) 135(L) 167    No results for input(s): INR in the last 168 hours. CMP Latest Ref Rng & Units 01/30/2020 01/29/2020 01/28/2020  Glucose 70 - 99 mg/dL 105(H) 63(L) 83  BUN 6 - 20 mg/dL 17 25(H) 37(H)  Creatinine 0.61 - 1.24 mg/dL 0.87 1.03 1.39(H)  Sodium 135 - 145 mmol/L 135 137 134(L)  Potassium 3.5 - 5.1  mmol/L 4.1 3.5 3.3(L)  Chloride 98 - 111 mmol/L 100 100 92(L)  CO2 22 - 32 mmol/L 25 25 31   Calcium 8.9 - 10.3 mg/dL ) 2.3(R) 0.0(T)  Total Protein 6.5 - 8.1 g/dL 5.9(L) 6.1(L) 6.4(L)  Total Bilirubin 0.3 - 1.2 mg/dL 0.9 6.2(U) 6.3(F)  Alkaline Phos 38 - 126 U/L 90 99 108  AST 15 - 41 U/L 20 22 29   ALT 0 - 44 U/L 23 27 32    EGD report reviewed, and findings discussed with Dr. 3.5(K yesterday.  @ASSESSMENTPLANBEGIN @ Assessment:  Gastric outlet obstruction.  This appears to have occurred from severe pancreatic inflammation, residual from his  initial episode of severe pancreatitis months ago.  While much of the sequelae from that initial episode in terms of fluid collections and ascites have resolved on follow-up imaging (after placement of pancreatic duct stent), we suspect there must still be significant inflammation in or near the pancreatic head causing reactive edema in the pylorus and proximal duodenum.  Nausea and vomiting as a result of gastric outlet obstruction.  He is at risk for protein calorie malnutrition  Plan: We will try him on a full liquid diet along with protein calorie supplements, slow intake, and observe him in the hospital another couple of days.  I am concerned that the degree of this outlet obstruction may not allow sufficient gastric emptying for him to maintain nutrition.  If that is the case, then we will request surgical consultation for jejunal tube.  That would allow enteral nutrition for a long as necessary for the edema to improve (unknown how long, possibly months)  While he is disappointed, Paul Allen understands and is agreeable to the plan.  He just wants to get better.  Total time 25 minutes  Christella Hartigan III Office: [phone]

## 2020-01-31 DIAGNOSIS — R1010 Upper abdominal pain, unspecified: Secondary | ICD-10-CM

## 2020-01-31 LAB — COMPREHENSIVE METABOLIC PANEL
ALT: 25 U/L (ref 0–44)
AST: 23 U/L (ref 15–41)
Albumin: 3.7 g/dL (ref 3.5–5.0)
Alkaline Phosphatase: 108 U/L (ref 38–126)
Anion gap: 7 (ref 5–15)
BUN: 10 mg/dL (ref 6–20)
CO2: 29 mmol/L (ref 22–32)
Calcium: 8.9 mg/dL (ref 8.9–10.3)
Chloride: 103 mmol/L (ref 98–111)
Creatinine, Ser: 0.82 mg/dL (ref 0.61–1.24)
GFR calc Af Amer: >60 mL/min (ref >60)
GFR calc non Af Amer: >60 mL/min (ref >60)
Glucose, Bld: 91 mg/dL (ref 70–99)
Potassium: 3.6 mmol/L (ref 3.5–5.1)
Sodium: 139 mmol/L (ref 135–145)
Total Bilirubin: 0.6 mg/dL (ref 0.3–1.2)
Total Protein: 6.2 g/dL — ABNORMAL LOW (ref 6.5–8.1)

## 2020-01-31 LAB — CBC
HCT: 43.7 % (ref 39.0–52.0)
Hemoglobin: 14.3 g/dL (ref 13.0–17.0)
MCH: 30.7 pg (ref 26.0–34.0)
MCHC: 32.7 g/dL (ref 30.0–36.0)
MCV: 93.8 fL (ref 80.0–100.0)
Platelets: 139 10*3/uL — ABNORMAL LOW (ref 150–400)
RBC: 4.66 MIL/uL (ref 4.22–5.81)
RDW: 13.4 % (ref 11.5–15.5)
WBC: 5.4 10*3/uL (ref 4.0–10.5)
nRBC: 0 % (ref 0.0–0.2)

## 2020-01-31 LAB — HEPARIN LEVEL (UNFRACTIONATED)
Heparin Unfractionated: 0.39 IU/mL (ref 0.30–0.70)
Heparin Unfractionated: 0.32 IU/mL (ref 0.30–0.70)

## 2020-01-31 NOTE — Progress Notes (Signed)
PROGRESS NOTE    Paul Allen  QDI:[id number] DOB: 04-May-1981 DOA: 01/26/2020 PCP: Kerin Perna, NP   Brief Narrative: 46 yom history of alcoholic pancreatitis/portal vein thrombosis on Eliquis presents with nausea vomiting generalized abdominal pain 3 days prior to admission on 2/7.  Patient is trying liquid diet but pain got worse denies using alcohol recently.  Work-up in the ED showed CT scan with gastric distention uncertain etiology sequelae associated with gastric outlet obstruction could not be excluded, stable pancreatic duct stent positioning, sigmoid diverticulosis.  Labs showed acute renal failure, elevated BUN, patient was admitted on IV fluids IV pain medication and GI was consulted.  Recent multiple admissions/ED or pcpc visits on 11/28/19, 12/02/2019, 01/02/2020, 01/07/2020. Patient was seen by GI managing NG tube n.p.o. IV fluids pain medication Protonix IV.  Subsequently underwent endoscopy 2/10-found to have gastric outlet obstruction at the level of pylorus with edematous inflammatory congestion and stenotic, distal gastritis.  Patient has been placed on liquid diet and along with nutritional supplement is being monitored for diet tolerance  Subjective:  Patient reports she has been able to tolerate liquid diet but still has abdominal pain after eating and has been getting IV Dilaudid.  Unable to drink Ensure at the same time and is taking breaks to drink Ensure. Had bowel movement this morning and feels good about it.  Assessment & Plan:  Nausea & vomiting due to gastric outlet obstruction: Intractable nausea vomiting leading to acute renal failure severe dehydration.  Managed with n.p.o. IV fluids NG tube.  NG tube removed during endoscopy 2/10- which showed, gastric outlet obstruction at the level of pylorus with edematous inflammatory congestion and stenotic, distal gastritis . Started full liquid diet 2/10-we will continue on liquid diet, protein calorie nutrition  -ensure supplement and  monitor for couple of days to ensure diet tolerance.  He complains of ongoing abdominal pain after eating food and has been needing IV Dilaudid.  However no more nausea distention or bloating. If not tolerating then consult surgery for J-tube.  Appreciate GI input on board.  Hypokalemia: Resolved.    ARF/AKI: Suspecting prerenal/volume depletion due to #1.  Renal functions improved to baseline receive IV fluid hydration NG tube, cut down ivf  Abnormal LFTs- acute hep panel negative, suspecting component of inflammation edema.  LFTs have normalized.   History of portal vein thrombosis/chronic superior mesenteric on Eliquis: Currently on heparin drip protocol switche to Eliquis once no more surgery planned and once tolerating diet  Nutrition: Nutrition Problem: Increased nutrient needs Etiology: acute illness Signs/Symptoms: estimated needs Interventions: Magic cup, Ensure Enlive (each supplement provides 350kcal and 20 grams of protein), Prostat, MVI   Body mass index is 21.6 kg/m.   DVT prophylaxis:Heparin gtt Code Status:full Family Communication: plan of care discussed with patient at bedside. Disposition Plan:Patient is from: Home       Anticipated d/c to: Home/TBD, 1-2 days       Barriers to d/c or conditions that needs to be met prior to d/c: Remains inpatient for diet challenge on liquid diet, continues to need iv opiates for pain control.  Consultants:GI Procedures:  egd 2/10 Impression: - Gastric outlet obstruction at the level of the pylorus which is edematous, inflammatory, congested and stenotic. - Distal gastritis (biopsied to check for H. pylori) and possible portal gastropathy changes in proximal stomach. Recommendations Return patient to hospital ward for ongoing care. - Continue IV PPI for another 24 hours. - Trial of full liquids, ensure TID. If he  fails this trial he will need to consider J tube (surgically placed likely). - OK to  resume heparin now.  Microbiology:see note Antimicrobials: Anti-infectives (From admission, onward)   None      Medications: Scheduled Meds: . feeding supplement (ENSURE ENLIVE)  237 mL Oral TID BM  . folic acid  1 mg Oral Daily  . pantoprazole (PROTONIX) IV  40 mg Intravenous Q12H  . thiamine  100 mg Oral Daily   Continuous Infusions: . heparin 1,200 Units/hr (01/30/20 1855)  . lactated ringers with kcl 75 mL/hr at 01/30/20 1807    Objective: Vitals:   01/30/20 1342 01/30/20 2027 01/31/20 0606 01/31/20 0934  BP: (!) 145/84 (!) 135/97 112/64 (!) 171/106  Pulse: (!) 51 (!) 54 (!) 50 (!) 54  Resp:  _0 Temp: (!) 97.5 F (36.4 C) 98.3 F (36.8 C) 97.9 F (36.6 C) 97.7 F (36.5 C)  TempSrc: Oral Oral Oral Oral  SpO2: 100% 100% 99% 100%  Weight:   66.4 kg   Height:        Intake/Output Summary (Last 24 hours) at 01/31/2020 1031 Last data filed at 01/31/2020 0600 Gross per 24 hour  Intake 2557.68 ml  Output 1425 ml  Net 1132.68 ml   Filed Weights   01/29/20 0456 01/30/20 0609 01/31/20 0606  Weight: 62.4 kg 64.6 kg 66.4 kg   Weight change: 1.724 kg  Body mass index is 21.6 kg/m.  Intake/Output from previous day: 02/11 0701 - 02/12 0700 In: 2557.7 [P.O.:240; I.V.:2317.7] Out: 1425 [Urine:1425] Intake/Output this shift: No intake/output data recorded.  Examination: General exam: AAO X3, NAD, HEENT:Oral mucosa moist, Ear/Nose WNL grossly. Respiratory system: bilaterally clear, no wheezing or crackles,no use of accessory muscle Cardiovascular system: S1 & S2 +, No JVD,. Gastrointestinal system: Abdomen soft, tenderness present on the mid abdomen/epigastrium, bowel sounds present.  Nervous System:Alert, awake, moving extremities and grossly nonfocal Extremities: No edema, distal peripheral pulses palpable.  Skin: No rashes,no icterus. MSK: Normal muscle bulk,tone, power  Data Reviewed: I have personally reviewed following labs and imaging  studies  CBC: Recent Labs  Lab 01/27/20 0509 01/28/20 0145 01/29/20 0206 01/30/20 0455 01/31/20 0422  WBC 10.0 8.4 5.5 4.1 5.4  HGB 15.3 14.6 13.9 13.2 14.3  HCT 43.6 44.3 41.6 39.6 43.7  MCV 90.1 92.9 91.8 91.7 93.8  PLT 214 167 135* 144* 882*   Basic Metabolic Panel: Recent Labs  Lab 01/27/20 0509 01/28/20 0145 01/29/20 0206 01/30/20 0455 01/31/20 0422  NA 134* 134* 137 135 139  K 2.7* 3.3* 3.5 4.1 3.6  CL 85* 92* 100 100 103  CO2 34* _1 GLUCOSE 101* 83 63* 105* 91  BUN 54* 37* 25* 17 10  CREATININE 1.90* 1.39* 1.03 0.87 0.82  CALCIUM 8.2* 8.8* 8.7* 8.5* 8.9  MG  --  1.9  --   --   --    GFR: Estimated Creatinine Clearance: 113.6 mL/min (by C-G formula based on SCr of 0.82 mg/dL). Liver Function Tests: Recent Labs  Lab 01/27/20 0509 01/28/20 0145 01/29/20 0206 01/30/20 0455 01/31/20 0422  AST _2 ALT 30 32 _3 ALKPHOS 117 108 99 90 108  BILITOT 0.9 1.4* 1.9* 0.9 0.6  PROT 6.6 6.4* 6.1* 5.9* 6.2*  ALBUMIN 3.9 3.7 3.6 3.4* 3.7   Recent Labs  Lab 01/26/20 2029 01/27/20 0509  LIPASE 97* 72*   No results for input(s): AMMONIA in the last 168 hours.  Coagulation Profile: No results for input(s): INR, PROTIME in the last 168 hours. Cardiac Enzymes: Recent Labs  Lab 01/26/20 1840  CKTOTAL 32*  CKMB 0.4*   BNP (last 3 results) No results for input(s): PROBNP in the last 8760 hours. HbA1C: No results for input(s): HGBA1C in the last 72 hours. CBG: No results for input(s): GLUCAP in the last 168 hours. Lipid Profile: No results for input(s): CHOL, HDL, LDLCALC, TRIG, CHOLHDL, LDLDIRECT in the last 72 hours. Thyroid Function Tests: No results for input(s): TSH, T4TOTAL, FREET4, T3FREE, THYROIDAB in the last 72 hours. Anemia Panel: No results for input(s): VITAMINB12, FOLATE, FERRITIN, TIBC, IRON, RETICCTPCT in the last 72 hours. Sepsis Labs: No results for input(s): PROCALCITON, LATICACIDVEN in the last 168  hours.  Recent Results (from the past 240 hour(s))  SARS CORONAVIRUS 2 (TAT 6-24 HRS) Nasopharyngeal Nasopharyngeal Swab     Status: None   Collection Time: 01/26/20 10:11 PM   Specimen: Nasopharyngeal Swab  Result Value Ref Range Status   SARS Coronavirus 2 NEGATIVE NEGATIVE Final    Comment: (NOTE) SARS-CoV-2 target nucleic acids are NOT DETECTED. The SARS-CoV-2 RNA is generally detectable in upper and lower respiratory specimens during the acute phase of infection. Negative results do not preclude SARS-CoV-2 infection, do not rule out co-infections with other pathogens, and should not be used as the sole basis for treatment or other patient management decisions. Negative results must be combined with clinical observations, patient history, and epidemiological information. The expected result is Negative. Fact Sheet for Patients: SugarRoll.be Fact Sheet for Healthcare Providers: https://www.woods-mathews.com/ This test is not yet approved or cleared by the [place] FDA and  has been authorized for detection and/or diagnosis of SARS-CoV-2 by FDA under an Emergency Use Authorization (EUA). This EUA will remain  in effect (meaning this test can be used) for the duration of the COVID-19 declaration under Section 56 4(b)(1) of the Act, 21 U.S.C. section 360bbb-3(b)(1), unless the authorization is terminated or revoked sooner. Performed at [facility], [place] [address] [place], [place] [address]       Radiology Studies: No results found.   LOS: 4 days   Time spent: More than 50% of that time was spent in counseling and/or coordination of care.  Antonieta Pert, MD [facility]  01/31/2020, 10:31 AM

## 2020-01-31 NOTE — Progress Notes (Addendum)
ANTICOAGULATION CONSULT NOTE  Pharmacy Consult for Heparin Indication: Portal vein thrombosis  Allergies  Allergen Reactions  . Penicillins Rash    Has patient had a PCN reaction causing immediate rash, facial/tongue/throat swelling, SOB or lightheadedness with hypotension: yes Has patient had a PCN reaction causing severe rash involving mucus membranes or skin necrosis: Yes Has patient had a PCN reaction that required hospitalization: No Has patient had a PCN reaction occurring within the last 10 years: No If all of the above answers are "NO", then may proceed with Cephalosporin use.     Patient Measurements: Height: 5\' 9"  (175.3 cm) Weight: 146 lb 4.8 oz (66.4 kg) IBW/kg (Calculated) : 70.7 Heparin Dosing Weight: total body weight  Vital Signs: Temp: 97.9 F (36.6 C) (02/12 0606) Temp Source: Oral (02/12 0606) BP: 112/64 (02/12 0606) Pulse Rate: 50 (02/12 0606)  Labs: Recent Labs    01/28/20 1914 01/29/20 0206 01/29/20 0206 01/29/20 2143 01/29/20 2143 01/30/20 0455 01/30/20 0830 01/30/20 1639 01/31/20 0422  HGB  --  13.9   < >  --   --  13.2  --   --  14.3  HCT  --  41.6  --   --   --  39.6  --   --  43.7  PLT  --  135*  --   --   --  144*  --   --  139*  APTT 51* 109*  --  56*  --   --   --   --   --   HEPARINUNFRC  --  0.75*   < > 0.30   < >  --  0.32 0.24* 0.39  CREATININE  --  1.03  --   --   --  0.87  --   --  0.82   < > = values in this interval not displayed.    Estimated Creatinine Clearance: 113.6 mL/min (by C-G formula based on SCr of 0.82 mg/dL).   Medical History: Past Medical History:  Diagnosis Date  . Abdominal pain   . Mesenteric vein thrombosis (HCC)    Dec 2020 in setting of pancreatitis  . Nausea & vomiting   . Pancreatitis     Medications:  PTA Eliquis 5mg  BID - LD 01/27/20 @ 05:47  Assessment: 39 y/o male admitted with nausea/vomiting/abdominal pain;acute pancreatitis. PMH significant for alcoholic pancreatitis and portal vein  thrombosis (on Eliquis PTA). Pt currently NPO, GI consulted. Eliquis being held, pharmacy consulted to dose/monitor IV heparin.  Baseline labs: -CBC: Hgb 15.3, Plt 214 -HL 1.48, falsely elevated due to Eliquis  Significant Events:  -EGD on 2/10, heparin resumed post-procedure  Today, 01/31/20  Heparin level = 0.39 units/mL remains therapeutic on heparin infusion of 1200 units/hr  Confirmed with RN that heparin infusing at correct rate. No signs of bleeding.  CBC: Hgb WNL. Pltc 139K, remain low but stable   Goal of Therapy:  Heparin level 0.3-0.7 units/ml Monitor platelets by anticoagulation protocol: Yes   Plan:   Continue heparin at current rate of 1200 units/hr  Confirmatory HL in 6 hours  Daily CBC and heparin level while on IV heparin  Follow along for ability to resume PO anticoagulation  4/10, PharmD 01/31/20 7:41 AM  Addendum:  Assessment: Confirmatory HL = 0.32 remains therapeutic on heparin infusion of 1200 units/hr. No heparin issues or bleeding.   Plan: Continue heparin infusion at current rate of 1200 units/hr Recheck HL and CBC with AM labs tomorrow  Cindi Carbon,  PharmD 01/31/20 11:36 AM

## 2020-01-31 NOTE — Progress Notes (Signed)
[facility] Progress Note  Chief Complaint: Gastric outlet obstruction  History:  Paul Allen feels better today, and that he is able to keep down a full liquid diet and several liquid protein calorie drinks per day.  He still gets spasms of upper abdominal severe pain requiring opioids.  Nausea has subsided and no longer vomiting.  He was happy to report 2 bowel movements of formed stool earlier today.  ROS: Cardiovascular: No chest pain Respiratory: No dyspnea Urinary: No dysuria  Objective:   Current Facility-Administered Medications:  .  feeding supplement (ENSURE ENLIVE) (ENSURE ENLIVE) liquid 237 mL, 237 mL, Oral, TID BM, Milus Banister, MD, 237 mL at 01/31/20 1017 .  folic acid (FOLVITE) tablet 1 mg, 1 mg, Oral, Daily, Milus Banister, MD, 1 mg at 01/31/20 1018 .  heparin ADULT infusion 100 units/mL (25000 units/260mL sodium chloride 0.45%), 1,200 Units/hr, Intravenous, Continuous, Wofford, Drew A, RPH, Last Rate: 12 mL/hr at 01/31/20 1052, 1,200 Units/hr at 01/31/20 1052 .  HYDROmorphone (DILAUDID) injection 0.5 mg, 0.5 mg, Intravenous, Q4H PRN, Milus Banister, MD, 0.5 mg at 01/31/20 1012 .  lactated ringers 1,000 mL with potassium chloride 20 mEq infusion, , Intravenous, Continuous, Kc, Ramesh, MD, Last Rate: 30 mL/hr at 01/31/20 1055, Rate Change at 01/31/20 1055 .  ondansetron (ZOFRAN) injection 4 mg, 4 mg, Intravenous, Q6H PRN, Milus Banister, MD, 4 mg at 01/31/20 0936 .  pantoprazole (PROTONIX) injection 40 mg, 40 mg, Intravenous, Q12H, Milus Banister, MD, 40 mg at 01/31/20 1015 .  phenol (CHLORASEPTIC) mouth spray 1 spray, 1 spray, Mouth/Throat, PRN, Milus Banister, MD .  promethazine (PHENERGAN) injection 12.5 mg, 12.5 mg, Intravenous, Q6H PRN, Milus Banister, MD, 12.5 mg at 01/27/20 0340 .  thiamine tablet 100 mg, 100 mg, Oral, Daily, Milus Banister, MD, 100 mg at 01/31/20 1018  . heparin 1,200 Units/hr (01/31/20 1052)  . lactated ringers with kcl 30 mL/hr at  01/31/20 1055     Vital signs in last 24 hrs: Vitals:   01/31/20 0934 01/31/20 1045  BP: (!) 171/106 136/79  Pulse: (!) 54 (!) 56  Resp: 19 17  Temp: 97.7 F (36.5 C) 98.7 F (37.1 C)  SpO2: 100% 100%    Intake/Output Summary (Last 24 hours) at 01/31/2020 1226 Last data filed at 01/31/2020 0600 Gross per 24 hour  Intake 2557.68 ml  Output 1425 ml  Net 1132.68 ml   He remains on IV heparin for his mesenteric vein thrombosis.  Physical Exam   HEENT: sclera anicteric, oral mucosa without lesions  Neck: supple, no thyromegaly, JVD or lymphadenopathy  Cardiac: RRR without murmurs, S1S2 heard, no peripheral edema  Pulm: clear to auscultation bilaterally, normal RR and effort noted  Abdomen: soft, epigastric tenderness as before, with active bowel sounds. No guarding or palpable hepatosplenomegaly  Skin; warm and dry, no jaundice  Recent Labs:  CBC Latest Ref Rng & Units 01/31/2020 01/30/2020 01/29/2020  WBC 4.0 - 10.5 K/uL 5.4 4.1 5.5  Hemoglobin 13.0 - 17.0 g/dL 14.3 13.2 13.9  Hematocrit 39.0 - 52.0 % 43.7 39.6 41.6  Platelets 150 - 400 K/uL 139(L) 144(L) 135(L)    No results for input(s): INR in the last 168 hours. CMP Latest Ref Rng & Units 01/31/2020 01/30/2020 01/29/2020  Glucose 70 - 99 mg/dL 91 105(H) 63(L)  BUN 6 - 20 mg/dL 10 17 25(H)  Creatinine 0.61 - 1.24 mg/dL 0.82 0.87 1.03  Sodium 135 - 145 mmol/L 139 135 137  Potassium  3.5 - 5.1 mmol/L 3.6 4.1 3.5  Chloride 98 - 111 mmol/L 103 100 100  CO2 22 - 32 mmol/L 29 25 25   Calcium 8.9 - 10.3 mg/dL 8.9 ) 4.1(L)  Total Protein 6.5 - 8.1 g/dL 6.2(L) 5.9(L) 6.1(L)  Total Bilirubin 0.3 - 1.2 mg/dL 0.6 0.9 2.4(M)  Alkaline Phos 38 - 126 U/L 108 90 99  AST 15 - 41 U/L 23 20 22   ALT 0 - 44 U/L 25 23 27     @ASSESSMENTPLANBEGIN @ Assessment: Gastric outlet obstruction from severe pyloric and proximal duodenal inflammation, believed to be secondary to residual effects of severe alcohol-related pancreatitis that  occurred months ago.  At risk of protein calorie malnutrition due to this obstruction.  Nausea and vomiting, improved from admission, thus far tolerating current liquid oral nutrition.  Upper abdominal pain, perhaps a combination of the residual peripancreatic inflammation and gastric outlet obstruction.  Plan: So far the current dietary plan seems to be tolerated well.  I expect he will have pain for a long time to come, but it least he is not vomiting.  I still think it is a bit too soon to know if we have sufficient success to discharge him home on this plan.  Recommend hospitalization least until tomorrow so we can see if he continues to tolerate this nutritional plan, keep him on IV heparin in the meantime.  When he seems ready to go home, and we do not think surgical consultation for jejunal tube is necessary, then he can be converted to oral anticoagulation.  I will return to see him tomorrow.  0.1(U III Office: [phone]

## 2020-02-01 DIAGNOSIS — K2980 Duodenitis without bleeding: Secondary | ICD-10-CM | POA: Insufficient documentation

## 2020-02-01 LAB — HEPARIN LEVEL (UNFRACTIONATED)
Heparin Unfractionated: 0.17 IU/mL — ABNORMAL LOW (ref 0.30–0.70)
Heparin Unfractionated: 0.35 IU/mL (ref 0.30–0.70)
Heparin Unfractionated: 0.31 IU/mL (ref 0.30–0.70)

## 2020-02-01 LAB — CBC
HCT: 39.4 % (ref 39.0–52.0)
Hemoglobin: 13.1 g/dL (ref 13.0–17.0)
MCH: 30.8 pg (ref 26.0–34.0)
MCHC: 33.2 g/dL (ref 30.0–36.0)
MCV: 92.5 fL (ref 80.0–100.0)
Platelets: 120 10*3/uL — ABNORMAL LOW (ref 150–400)
RBC: 4.26 MIL/uL (ref 4.22–5.81)
RDW: 13.2 % (ref 11.5–15.5)
WBC: 5.2 10*3/uL (ref 4.0–10.5)
nRBC: 0 % (ref 0.0–0.2)

## 2020-02-01 MED ORDER — HEPARIN (PORCINE) 25000 UT/250ML-% IV SOLN
1500.0000 [IU]/h | INTRAVENOUS | Status: AC
  Administered 2020-02-01: 1400 [IU]/h via INTRAVENOUS
  Administered 2020-02-02: 1500 [IU]/h via INTRAVENOUS
  Filled 2020-02-01 (×2): qty 250

## 2020-02-01 MED ORDER — HEPARIN BOLUS VIA INFUSION
1000.0000 [IU] | Freq: Once | INTRAVENOUS | Status: AC
  Administered 2020-02-01: 1000 [IU] via INTRAVENOUS
  Filled 2020-02-01: qty 1000

## 2020-02-01 MED ORDER — STERILE WATER FOR INJECTION IJ SOLN
INTRAMUSCULAR | Status: AC
  Administered 2020-02-01: 10 mL
  Filled 2020-02-01: qty 10

## 2020-02-01 NOTE — Progress Notes (Signed)
Pt moderate pain continues with full liquid diet. Rn continuing to medicate for pain and other needs. Pt stated he did not feel like he is ready to go home today. Rn will continue to monitor.

## 2020-02-01 NOTE — Progress Notes (Addendum)
ANTICOAGULATION CONSULT NOTE  Pharmacy Consult for Heparin Indication: Portal vein thrombosis  Allergies  Allergen Reactions  . Penicillins Rash    Has patient had a PCN reaction causing immediate rash, facial/tongue/throat swelling, SOB or lightheadedness with hypotension: yes Has patient had a PCN reaction causing severe rash involving mucus membranes or skin necrosis: Yes Has patient had a PCN reaction that required hospitalization: No Has patient had a PCN reaction occurring within the last 10 years: No If all of the above answers are "NO", then may proceed with Cephalosporin use.     Patient Measurements: Height: 5\' 9"  (175.3 cm) Weight: 142 lb 14.4 oz (64.8 kg) IBW/kg (Calculated) : 70.7 Heparin Dosing Weight: total body weight = 65 kg  Vital Signs: Temp: 97.9 F (36.6 C) (02/13 0546) Temp Source: Oral (02/13 0546) BP: 130/82 (02/13 0546) Pulse Rate: 47 (02/13 0546)  Labs: Recent Labs    01/29/20 2143 01/30/20 0455 01/30/20 0830 01/31/20 0422 01/31/20 1038 02/01/20 0415  HGB  --  13.2   < > 14.3  --  13.1  HCT  --  39.6  --  43.7  --  39.4  PLT  --  144*  --  139*  --  120*  APTT 56*  --   --   --   --   --   HEPARINUNFRC 0.30  --    < > 0.39 0.32 0.17*  CREATININE  --  0.87  --  0.82  --   --    < > = values in this interval not displayed.    Estimated Creatinine Clearance: 110.9 mL/min (by C-G formula based on SCr of 0.82 mg/dL).   Medical History: Past Medical History:  Diagnosis Date  . Abdominal pain   . Mesenteric vein thrombosis (HCC)    Dec 2020 in setting of pancreatitis  . Nausea & vomiting   . Pancreatitis     Medications:  PTA Eliquis 5mg  BID - LD 01/27/20 @ 05:47  Assessment: 39 y/o male admitted with nausea/vomiting/abdominal pain;acute pancreatitis. PMH significant for alcoholic pancreatitis and portal vein thrombosis (on Eliquis PTA). Pt currently NPO, GI consulted. Eliquis being held, pharmacy consulted to dose/monitor IV  heparin.  Baseline labs: -CBC: Hgb 15.3, Plt 214 -HL 1.48, falsely elevated due to Eliquis  Significant Events:  -EGD on 2/10, heparin resumed post-procedure  Today, 02/01/20  Heparin level = 0.17 units/mL is subtherapeutic on heparin infusion of 1200 units/hr  Confirmed with RN that heparin infusing at correct rate. No signs of bleeding.  CBC: Hgb WNL. Pltc 120K, remain low, slightly decreased  Goal of Therapy:  Heparin level 0.3-0.7 units/ml Monitor platelets by anticoagulation protocol: Yes   Plan:   Heparin bolus of 1000 units x1  Increase heparin infusion to 1400 units/hr  Check HL in 6 hours  Daily CBC and heparin level while on IV heparin  Follow along for ability to resume PO anticoagulation  4/10, PharmD 02/01/20 8:12 AM  Addendum: Afternoon heparin level follow up  Assessment:  HL = 0.35 is therapeutic on heparin infusion of 1400 units/hr  Confirmed with RN that heparin infusing at correct rate. No signs of bleeding  Plan:  Continue heparin infusion at current rate of 1400 units/hr  Check confirmatory HL in 6 hours  Cindi Carbon, PharmD 02/01/20 4:02 PM

## 2020-02-01 NOTE — Plan of Care (Signed)
  Problem: Clinical Measurements: Goal: Ability to maintain clinical measurements within normal limits will improve Outcome: Progressing Goal: Cardiovascular complication will be avoided Outcome: Progressing   Problem: Activity: Goal: Risk for activity intolerance will decrease Outcome: Progressing   Problem: Coping: Goal: Level of anxiety will decrease Outcome: Progressing   Problem: Elimination: Goal: Will not experience complications related to bowel motility Outcome: Progressing Goal: Will not experience complications related to urinary retention Outcome: Progressing   Problem: Pain Managment: Goal: General experience of comfort will improve Outcome: Progressing   Problem: Safety: Goal: Ability to remain free from injury will improve Outcome: Progressing

## 2020-02-01 NOTE — Plan of Care (Signed)
?  Problem: Coping: ?Goal: Level of anxiety will decrease ?Outcome: Progressing ?  ?Problem: Nutrition: ?Goal: Adequate nutrition will be maintained ?Outcome: Progressing ?  ?Problem: Elimination: ?Goal: Will not experience complications related to bowel motility ?Outcome: Progressing ?  ?Problem: Pain Managment: ?Goal: General experience of comfort will improve ?Outcome: Progressing ?  ?

## 2020-02-01 NOTE — Progress Notes (Signed)
ANTICOAGULATION CONSULT NOTE - Follow Up Consult  Pharmacy Consult for Heparin Indication: Portal vein thrombosis  Allergies  Allergen Reactions  . Penicillins Rash    Has patient had a PCN reaction causing immediate rash, facial/tongue/throat swelling, SOB or lightheadedness with hypotension: yes Has patient had a PCN reaction causing severe rash involving mucus membranes or skin necrosis: Yes Has patient had a PCN reaction that required hospitalization: No Has patient had a PCN reaction occurring within the last 10 years: No If all of the above answers are "NO", then may proceed with Cephalosporin use.     Patient Measurements: Height: 5\' 9"  (175.3 cm) Weight: 142 lb 14.4 oz (64.8 kg) IBW/kg (Calculated) : 70.7 Heparin Dosing Weight:   Vital Signs: Temp: 98.2 F (36.8 C) (02/13 2127) Temp Source: Oral (02/13 2127) BP: 139/93 (02/13 2127) Pulse Rate: 56 (02/13 2127)  Labs: Recent Labs    01/30/20 0455 01/30/20 0830 01/31/20 0422 01/31/20 1038 02/01/20 0415 02/01/20 1439 02/01/20 2030  HGB 13.2   < > 14.3  --  13.1  --   --   HCT 39.6  --  43.7  --  39.4  --   --   PLT 144*  --  139*  --  120*  --   --   HEPARINUNFRC  --    < > 0.39   < > 0.17* 0.35 0.31  CREATININE 0.87  --  0.82  --   --   --   --    < > = values in this interval not displayed.    Estimated Creatinine Clearance: 110.9 mL/min (by C-G formula based on SCr of 0.82 mg/dL).   Medications:  Infusions:  . heparin 1,400 Units/hr (02/01/20 0826)    Assessment: Patient with heparin at goal but lower end of goal.  No heparin issues per RN.  Goal of Therapy:  Heparin level 0.3-0.7 units/ml Monitor platelets by anticoagulation protocol: Yes   Plan:  Increase heparin to 1500 units/hr Recheck level at 0800  02/03/20 [place] 02/01/2020,11:33 PM

## 2020-02-01 NOTE — Progress Notes (Signed)
PROGRESS NOTE    Paul Allen  VVO:[id number] DOB: 1981/06/06 DOA: 01/26/2020 PCP: Kerin Perna, NP   Brief narrative 15 yom history of alcoholic pancreatitis/portal vein thrombosis on Eliquis presents with nausea vomiting generalized abdominal pain 3 days prior to admission on 2/7.  Patient is trying liquid diet but pain got worse denies using alcohol recently.  Work-up in the ED showed CT scan with gastric distention uncertain etiology sequelae associated with gastric outlet obstruction could not be excluded, stable pancreatic duct stent positioning, sigmoid diverticulosis.  Labs showed acute renal failure, elevated BUN, patient was admitted on IV fluids IV pain medication and GI was consulted.  Recent multiple admissions/ED or pcpc visits on 11/28/19, 12/02/2019, 01/02/2020, 01/07/2020. Patient was seen by GI managing NG tube n.p.o. IV fluids pain medication Protonix IV.  Subsequently underwent endoscopy 2/10-found to have gastric outlet obstruction at the level of pylorus with edematous inflammatory congestion and stenotic, distal gastritis.  Patient has been placed on liquid diet and along with nutritional supplement is being monitored for diet tolerance. Patient at this time is tolerating diet well-liquid diet with Ensure.  Postprandial he has spasm and pain as expected.  He is having bowel movements feel well and no nausea vomiting abdominal distention.  Subjective Complains of abdominal pain after oral intake needing pain medication.  No nausea or vomiting.  Having bowel movements.  Issues addressed  Nausea & vomiting due to gastric outlet obstruction: Intractable nausea vomiting leading to acute renal failure severe dehydration.  NG tube removed.  Status post endoscopy-started full liquid diet 2/10-so far doing well,tolerating no nausea vomiting or abdominal distention.  Having bowel movement.  Has spasm and postprandial abdominal pain needing pain medication.  Does not feel ready  for home today.  appreciate GI inputs and d/c home once okay with GI. Cont on liquid diet, protein calorie nutrition ensure  Hypokalemia: Resolved.    ARF/AKI: Suspecting prerenal/volume depletion due to #1.  Renal functions improved to baseline receive IV fluid -stopped  Abnormal LFTs- acute hep panel negative, suspecting component of inflammation edema.  LFTs have normalized.   History of portal vein thrombosis/chronic superior mesenteric on Eliquis:  On heparin drip protocol switch to Eliquis on d/c.  Nutrition: Nutrition Problem: Increased nutrient needs Etiology: acute illness Signs/Symptoms: estimated needs Interventions: Magic cup, Ensure Enlive (each supplement provides 350kcal and 20 grams of protein), Prostat, MVI  Body mass index is 21.6 kg/m.   DVT prophylaxis:Heparin gtt Code Status:full Family Communication: plan of care discussed with patient at bedside. Disposition Plan: Patient is from: Home Anticipated d/c to: Home soon. Barriers to d/c or conditions that needs to be met prior to d/c: home today if continues to tolerate diet and okay GI.  Consultants:GI Procedures:  egd 2/10 Impression: - Gastric outlet obstruction at the level of the pylorus which is edematous, inflammatory, congested and stenotic. - Distal gastritis (biopsied to check for H. pylori) and possible portal gastropathy changes in proximal stomach. Recommendations Return patient to hospital ward for ongoing care. - Continue IV PPI for another 24 hours. - Trial of full liquids, ensure TID. If he fails this trial he will need to consider J tube (surgically placed likely).  Microbiology:see note Antimicrobials: Anti-infectives (From admission, onward)   None      Medications: Scheduled Meds: . feeding supplement (ENSURE ENLIVE)  237 mL Oral TID BM  . folic acid  1 mg Oral Daily  . pantoprazole (PROTONIX) IV  40 mg Intravenous Q12H  . thiamine  100 mg Oral Daily   Continuous  Infusions: . heparin 1,400 Units/hr (02/01/20 0826)    Objective: Vitals:   01/31/20 1235 01/31/20 1409 01/31/20 2154 02/01/20 0546  BP: 124/81 (!) 156/91 (!) 146/90 130/82  Pulse: (!) 55 (!) 52 (!) 51 (!) 47  Resp: '18 17 16 16  ' Temp: 97.9 F (36.6 C) 97.9 F (36.6 C) 98 F (36.7 C) 97.9 F (36.6 C)  TempSrc: Oral Oral Oral Oral  SpO2: 100% 100% 100% 100%  Weight:    64.8 kg  Height:        Intake/Output Summary (Last 24 hours) at 02/01/2020 1129 Last data filed at 02/01/2020 1120 Gross per 24 hour  Intake 2303.78 ml  Output 4300 ml  Net -1996.22 ml   Filed Weights   01/30/20 0609 01/31/20 0606 02/01/20 0546  Weight: 64.6 kg 66.4 kg 64.8 kg   Weight change: -1.542 kg  Body mass index is 21.1 kg/m.  Intake/Output from previous day: 02/12 0701 - 02/13 0700 In: 1584 [P.O.:1080; I.V.:504] Out: 4300 [Urine:4300] Intake/Output this shift: Total I/O In: 1119.8 [P.O.:840; I.V.:279.8] Out: -   Examination: General exam: AAO X3, NAD, HEENT:Oral mucosa moist, Ear/Nose WNL grossly. Respiratory system: bilaterally clear, no wheezing or crackles,no use of accessory muscle Cardiovascular system: S1 & S2 +, No JVD,. Gastrointestinal system: Abdomen is soft, tenderness in the mid abdomen, bowel sounds present, nondistended.   Nervous System:Alert, awake, moving extremities and grossly nonfocal Extremities: No edema, distal peripheral pulses palpable.  Skin: No rashes,no icterus. MSK: Normal muscle bulk,tone, power  Data Reviewed: I have personally reviewed following labs and imaging studies  CBC: Recent Labs  Lab 01/28/20 0145 01/29/20 0206 01/30/20 0455 01/31/20 0422 02/01/20 0415  WBC 8.4 5.5 4.1 5.4 5.2  HGB 14.6 13.9 13.2 14.3 13.1  HCT 44.3 41.6 39.6 43.7 39.4  MCV 92.9 91.8 91.7 93.8 92.5  PLT 167 135* 144* 139* 110*   Basic Metabolic Panel: Recent Labs  Lab 01/27/20 0509 01/28/20 0145 01/29/20 0206 01/30/20 0455 01/31/20 0422  NA 134* 134* 137 135  139  K 2.7* 3.3* 3.5 4.1 3.6  CL 85* 92* 100 100 103  CO2 34* '31 25 25 29  ' GLUCOSE 101* 83 63* 105* 91  BUN 54* 37* 25* 17 10  CREATININE 1.90* 1.39* 1.03 0.87 0.82  CALCIUM 8.2* 8.8* 8.7* 8.5* 8.9  MG  --  1.9  --   --   --    GFR: Estimated Creatinine Clearance: 110.9 mL/min (by C-G formula based on SCr of 0.82 mg/dL). Liver Function Tests: Recent Labs  Lab 01/27/20 0509 01/28/20 0145 01/29/20 0206 01/30/20 0455 01/31/20 0422  AST '23 29 22 20 23  ' ALT 30 32 '27 23 25  ' ALKPHOS 117 108 99 90 108  BILITOT 0.9 1.4* 1.9* 0.9 0.6  PROT 6.6 6.4* 6.1* 5.9* 6.2*  ALBUMIN 3.9 3.7 3.6 3.4* 3.7   Recent Labs  Lab 01/26/20 2029 01/27/20 0509  LIPASE 97* 72*   No results for input(s): AMMONIA in the last 168 hours. Coagulation Profile: No results for input(s): INR, PROTIME in the last 168 hours. Cardiac Enzymes: Recent Labs  Lab 01/26/20 1840  CKTOTAL 32*  CKMB 0.4*   BNP (last 3 results) No results for input(s): PROBNP in the last 8760 hours. HbA1C: No results for input(s): HGBA1C in the last 72 hours. CBG: No results for input(s): GLUCAP in the last 168 hours. Lipid Profile: No results for input(s): CHOL, HDL, LDLCALC, TRIG, CHOLHDL, LDLDIRECT in  the last 72 hours. Thyroid Function Tests: No results for input(s): TSH, T4TOTAL, FREET4, T3FREE, THYROIDAB in the last 72 hours. Anemia Panel: No results for input(s): VITAMINB12, FOLATE, FERRITIN, TIBC, IRON, RETICCTPCT in the last 72 hours. Sepsis Labs: No results for input(s): PROCALCITON, LATICACIDVEN in the last 168 hours.  Recent Results (from the past 240 hour(s))  SARS CORONAVIRUS 2 (TAT 6-24 HRS) Nasopharyngeal Nasopharyngeal Swab     Status: None   Collection Time: 01/26/20 10:11 PM   Specimen: Nasopharyngeal Swab  Result Value Ref Range Status   SARS Coronavirus 2 NEGATIVE NEGATIVE Final    Comment: (NOTE) SARS-CoV-2 target nucleic acids are NOT DETECTED. The SARS-CoV-2 RNA is generally detectable in upper  and lower respiratory specimens during the acute phase of infection. Negative results do not preclude SARS-CoV-2 infection, do not rule out co-infections with other pathogens, and should not be used as the sole basis for treatment or other patient management decisions. Negative results must be combined with clinical observations, patient history, and epidemiological information. The expected result is Negative. Fact Sheet for Patients: SugarRoll[employer]be Fact Sheet for Healthcare Providers: https://www.woods-mathews.com/ This test is not yet approved or cleared by the [place] FDA and  has been authorized for detection and/or diagnosis of SARS-CoV-2 by FDA under an Emergency Use Authorization (EUA). This EUA will remain  in effect (meaning this test can be used) for the duration of the COVID-19 declaration under Section 56 4(b)(1) of the Act, 21 U.S.C. section 360bbb-3(b)(1), unless the authorization is terminated or revoked sooner. Performed at [facility], [place] [address], [place], [place] [address]       Radiology Studies: No results found.   LOS: 5 days   Time spent: More than 50% of that time was spent in counseling and/or coordination of care.  Antonieta Pert, MD [facility]  02/01/2020, 11:29 AM

## 2020-02-02 LAB — CBC
HCT: 44.5 % (ref 39.0–52.0)
Hemoglobin: 14.8 g/dL (ref 13.0–17.0)
MCH: 31.2 pg (ref 26.0–34.0)
MCHC: 33.3 g/dL (ref 30.0–36.0)
MCV: 93.7 fL (ref 80.0–100.0)
Platelets: 118 10*3/uL — ABNORMAL LOW (ref 150–400)
RBC: 4.75 MIL/uL (ref 4.22–5.81)
RDW: 13.4 % (ref 11.5–15.5)
WBC: 5.5 10*3/uL (ref 4.0–10.5)
nRBC: 0 % (ref 0.0–0.2)

## 2020-02-02 LAB — HEPARIN LEVEL (UNFRACTIONATED): Heparin Unfractionated: 0.47 IU/mL (ref 0.30–0.70)

## 2020-02-02 MED ORDER — PANTOPRAZOLE SODIUM 40 MG PO TBEC: 40.0000 mg | DELAYED_RELEASE_TABLET | Freq: Two times a day (BID) | ORAL | 0 refills | Status: DC

## 2020-02-02 MED ORDER — OXYCODONE HCL 5 MG PO TABS: 5.0000 mg | ORAL_TABLET | Freq: Four times a day (QID) | ORAL | 0 refills | Status: AC | PRN

## 2020-02-02 MED ORDER — ENSURE ENLIVE PO LIQD: 237.0000 mL | Freq: Three times a day (TID) | ORAL | 0 refills | Status: AC

## 2020-02-02 MED ORDER — ONDANSETRON 4 MG PO TBDP: 4.0000 mg | ORAL_TABLET | Freq: Three times a day (TID) | ORAL | 0 refills | Status: DC | PRN

## 2020-02-02 MED ORDER — ALUM & MAG HYDROXIDE-SIMETH 200-200-20 MG/5ML PO SUSP: 30.0000 mL | ORAL | 0 refills | Status: DC | PRN

## 2020-02-02 MED ORDER — APIXABAN 5 MG PO TABS: 5.0000 mg | ORAL_TABLET | Freq: Two times a day (BID) | ORAL | Status: DC

## 2020-02-02 MED ORDER — ALUM & MAG HYDROXIDE-SIMETH 200-200-20 MG/5ML PO SUSP
30.0000 mL | ORAL | Status: DC | PRN
  Administered 2020-02-02 (×2): 30 mL via ORAL
  Filled 2020-02-02 (×2): qty 30

## 2020-02-02 MED ORDER — APIXABAN 5 MG PO TABS
5.0000 mg | ORAL_TABLET | Freq: Two times a day (BID) | ORAL | Status: DC
  Administered 2020-02-02: 5 mg via ORAL
  Filled 2020-02-02: qty 1

## 2020-02-02 NOTE — Progress Notes (Addendum)
Patient remains stable, no change from am assessment. Eliquis times reviewed, pt agreeable 11a and 11p. First dose given at noon. Discharge instructions reviewed, questions concerns denied at this time. Pt encouraged to contact GI for follow post discharge.

## 2020-02-02 NOTE — Progress Notes (Addendum)
Discussed discharge plan with patient per Hospitalist recommendation. Pt expressed concerns regarding follow up with Gastroenterologist. Pt reports not feeling comfortable with current discharge plan due to recent readmission. Hospitalist updated and GI on call provider office called.  Awaiting return call.

## 2020-02-02 NOTE — Discharge Summary (Signed)
Physician Discharge Summary  Paul CreedBrian E Waldron GNF:[id number] DOB: 06/02/1981 DOA: 01/26/2020  PCP: Grayce SessionsEdwards, Michelle P, NP  Admit date: 01/26/2020 Discharge date: 02/02/2020  Admitted From: Home Disposition: Home  Recommendations for Outpatient Follow-up:  1. Follow up with PCP in 1-2 weeks 2. Please obtain BMP/CBC in one week 3. Please follow up on the following pending results:  Home Health: No Equipment/Devices: None  Discharge Condition: Stable Code Status: Full code Diet recommendation: Full liquid diet with Ensure supplementation  Brief/Interim Summary: 8 yom history of alcoholic pancreatitis/portal vein thrombosis on Eliquis presents with nausea vomiting generalized abdominal pain 3 days prior to admission on 2/7. Patient is trying liquid diet but pain got worse denies using alcohol recently. Work-up in the ED showed CT scan with gastric distention uncertain etiology sequelae associated with gastric outlet obstruction could not be excluded, stable pancreatic duct stent positioning, sigmoid diverticulosis. Labs showed acute renal failure, elevated BUN, patient was admitted on IV fluids IV pain medication and GI was consulted. Recent multiple admissions/ED or pcpc visits on 11/28/19, 12/02/2019, 01/02/2020, 01/07/2020. Patient was seen by GI managing NG tube n.p.o. IV fluids pain medication Protonix IV. Subsequently underwent endoscopy 2/10-found to have gastric outlet obstruction at the level of pylorus with edematous inflammatory congestion and stenotic, distal gastritis.Patient has been placed on liquid diet and along with nutritional supplement is being monitored fordiet tolerance. Patient at this time is tolerating diet well-liquid diet with Ensure.  Postprandial he has spasm and pain as expected.  He is having bowel movements feel well and no nausea vomiting abdominal distention. Discussed with Dr. Myrtie Neitheranis from GI who advised that okay for discharge home and he will follow the  patient as outpatient. Patient requesting oxycodone for significant pain advised to take only for severe pain. Advised to follow-up with PCP for pain management. Patient is medically stable for discharge today.  Discharge Diagnoses:   Nausea & vomiting due to gastric outlet obstruction: Intractable nausea vomiting leading to acute renal failure severe dehydration.  NG tube removed.  Status post endoscopy-startedfull liquid diet 2/10-so far doing well,tolerating no nausea vomiting or abdominal distention.  Having bowel movement.  Has spasm and postprandial abdominal pain as expected and patient will continue on current PPI, liquid diet nutrition supplement requesting oxycodone for pain control. Follow-up with GI and PCP. Follow-up with pain management clinic.   Hypokalemia:Resolved.   ARF/AKI: Suspecting prerenal/volume depletion due to #1. Renal functions improved to baseline receive IV fluid -stopped  Abnormal LFTs- acute hep panel negative, suspecting component of inflammation edema. LFTs have normalized.   History of portal vein thrombosis/chronic superior mesenteric on Eliquis: On heparin drip protocol switch to Eliquis on d/c.  Nutrition: Nutrition Problem: Increased nutrient needs Etiology: acute illness Signs/Symptoms: estimated needs Interventions: Magic cup, Ensure Enlive (each supplement provides 350kcal and 20 grams of protein), Prostat, MVI  Subjective: Able to tolerate liquid diet no nausea vomiting but has postprandial pain as expected still for 45 minutes after oral intake needing opiates for pain control.Having bowel movement. Discharge Exam: Vitals:   02/02/20 0530 02/02/20 0840  BP: 138/90 (!) 146/93  Pulse: (!) 52 61  Resp:    Temp: 97.9 F (36.6 C)   SpO2: 100% 100%   General: Pt is alert, awake, not in acute distress Cardiovascular: RRR, S1/S2 +, no rubs, no gallops Respiratory: CTA bilaterally, no wheezing, no rhonchi Abdominal: Soft, NT, ND, bowel  sounds + Extremities: no edema, no cyanosis  Discharge Instructions  Discharge Instructions    Diet  full liquid   Complete by: As directed    Discharge instructions   Complete by: As directed    Please call call MD or return to ER for similar or worsening recurring problem that brought you to hospital or if any fever,nausea/vomiting,abdominal pain, uncontrolled pain, chest pain,  shortness of breath or any other alarming symptoms.  Please follow-up your doctor as instructed in a week time and call the office for appointment.  Please avoid alcohol, smoking, or any other illicit substance and maintain healthy habits including taking your regular medications as prescribed.  You were cared for by a hospitalist during your hospital stay. If you have any questions about your discharge medications or the care you received while you were in the hospital after you are discharged, you can call the unit and ask to speak with the hospitalist on call if the hospitalist that took care of you is not available.  Once you are discharged, your primary care physician will handle any further medical issues. Please note that NO REFILLS for any discharge medications will be authorized once you are discharged, as it is imperative that you return to your primary care physician (or establish a relationship with a primary care physician if you do not have one) for your aftercare needs so that they can reassess your need for medications and monitor your lab values   Discharge instructions   Complete by: As directed    Please call call MD or return to ER for similar or worsening recurring problem that brought you to hospital or if any fever,nausea/vomiting,abdominal pain, uncontrolled pain, chest pain,  shortness of breath or any other alarming symptoms.  Please follow-up your doctor as instructed in a week time and call the office for appointment.  Please avoid alcohol, smoking, or any other illicit substance and  maintain healthy habits including taking your regular medications as prescribed.  You were cared for by a hospitalist during your hospital stay. If you have any questions about your discharge medications or the care you received while you were in the hospital after you are discharged, you can call the unit and ask to speak with the hospitalist on call if the hospitalist that took care of you is not available.  Once you are discharged, your primary care physician will handle any further medical issues. Please note that NO REFILLS for any discharge medications will be authorized once you are discharged, as it is imperative that you return to your primary care physician (or establish a relationship with a primary care physician if you do not have one) for your aftercare needs so that they can reassess your need for medications and monitor your lab values.   Increase activity slowly   Complete by: As directed    Increase activity slowly   Complete by: As directed      Allergies as of 02/02/2020      Reactions   Penicillins Rash   Has patient had a PCN reaction causing immediate rash, facial/tongue/throat swelling, SOB or lightheadedness with hypotension: yes Has patient had a PCN reaction causing severe rash involving mucus membranes or skin necrosis: Yes Has patient had a PCN reaction that required hospitalization: No Has patient had a PCN reaction occurring within the last 10 years: No If all of the above answers are "NO", then may proceed with Cephalosporin use.      Medication List    TAKE these medications   alum & mag hydroxide-simeth 200-200-20 MG/5ML suspension Commonly known as: MAALOX/MYLANTA  Take 30 mLs by mouth every 4 (four) hours as needed for indigestion or heartburn (gas pain).   apixaban 5 MG Tabs tablet Commonly known as: ELIQUIS Take 1 tablet (5 mg total) by mouth 2 (two) times daily.   feeding supplement (ENSURE ENLIVE) Liqd Take 237 mLs by mouth 3 (three) times daily  between meals for 7 days.   folic acid 1 MG tablet Commonly known as: FOLVITE Take 1 tablet (1 mg total) by mouth daily.   ondansetron 4 MG disintegrating tablet Commonly known as: ZOFRAN-ODT Take 1 tablet (4 mg total) by mouth every 8 (eight) hours as needed for up to 30 doses for nausea. What changed: reasons to take this   oxyCODONE 5 MG immediate release tablet Commonly known as: Roxicodone Take 1 tablet (5 mg total) by mouth every 6 (six) hours as needed for up to 5 days for severe pain.   pantoprazole 40 MG tablet Commonly known as: PROTONIX Take 1 tablet (40 mg total) by mouth 2 (two) times daily. What changed: when to take this   thiamine 100 MG tablet Take 1 tablet (100 mg total) by mouth daily.      Follow-up Information    Grayce Sessions, NP Follow up in 1 week(s).   Specialty: Internal Medicine Contact information: [address] [place] [address] [phone]          Allergies  Allergen Reactions  . Penicillins Rash    Has patient had a PCN reaction causing immediate rash, facial/tongue/throat swelling, SOB or lightheadedness with hypotension: yes Has patient had a PCN reaction causing severe rash involving mucus membranes or skin necrosis: Yes Has patient had a PCN reaction that required hospitalization: No Has patient had a PCN reaction occurring within the last 10 years: No If all of the above answers are "NO", then may proceed with Cephalosporin use.     The results of significant diagnostics from this hospitalization (including imaging, microbiology, ancillary and laboratory) are listed below for reference.    Microbiology: Recent Results (from the past 240 hour(s))  SARS CORONAVIRUS 2 (TAT 6-24 HRS) Nasopharyngeal Nasopharyngeal Swab     Status: None   Collection Time: 01/26/20 10:11 PM   Specimen: Nasopharyngeal Swab  Result Value Ref Range Status   SARS Coronavirus 2 NEGATIVE NEGATIVE Final    Comment: (NOTE) SARS-CoV-2  target nucleic acids are NOT DETECTED. The SARS-CoV-2 RNA is generally detectable in upper and lower respiratory specimens during the acute phase of infection. Negative results do not preclude SARS-CoV-2 infection, do not rule out co-infections with other pathogens, and should not be used as the sole basis for treatment or other patient management decisions. Negative results must be combined with clinical observations, patient history, and epidemiological information. The expected result is Negative. Fact Sheet for Patients: HairSlick.no Fact Sheet for Healthcare Providers: quierodirigir.com This test is not yet approved or cleared by the [place] FDA and  has been authorized for detection and/or diagnosis of SARS-CoV-2 by FDA under an Emergency Use Authorization (EUA). This EUA will remain  in effect (meaning this test can be used) for the duration of the COVID-19 declaration under Section 56 4(b)(1) of the Act, 21 U.S.C. section 360bbb-3(b)(1), unless the authorization is terminated or revoked sooner. Performed at [facility], [address], [place], [place] [address]     Procedures/Studies: CT ABDOMEN PELVIS WO CONTRAST  Result Date: 01/26/2020 CLINICAL DATA:  Nausea and vomiting. EXAM: CT ABDOMEN AND PELVIS WITHOUT CONTRAST TECHNIQUE: Multidetector  CT imaging of the abdomen and pelvis was performed following the standard protocol without IV contrast. COMPARISON:  January 06, 2020 FINDINGS: Lower chest: No acute abnormality. Hepatobiliary: No focal liver abnormality is seen. The areas of heterogeneous enhancement seen throughout the liver on the prior contrast enhanced CT are not clearly identified on this noncontrast enhanced study P the gallbladder is moderately distended without evidence of gallstones, gallbladder wall thickening, or biliary dilatation. Pancreas: The pancreatic duct stent is again seen and unchanged in  position. The pancreatic parenchyma is normal in appearance. Spleen: Normal in size without focal abnormality. Adrenals/Urinary Tract: Adrenal glands are unremarkable. Kidneys are normal, without renal calculi, focal lesion, or hydronephrosis. Bladder is unremarkable. Stomach/Bowel: There is marked severity gastric distension. The visualized loops of large and small bowel are normal in caliber. The appendix is surgically absent. No evidence of bowel wall thickening, distention, or inflammatory changes. Noninflamed diverticula are seen throughout the sigmoid colon. Vascular/Lymphatic: There is moderate severity aortic atherosclerosis. No enlarged abdominal or pelvic lymph nodes. Reproductive: Prostate is unremarkable. Other: No abdominal wall hernia or abnormality. No abdominopelvic ascites. Musculoskeletal: No acute or significant osseous findings. IMPRESSION: 1. Marked severity gastric distension, of uncertain etiology. Sequelae associated with gastric outlet obstruction cannot be excluded. 2. Stable pancreatic duct stent positioning. 3. Sigmoid diverticulosis. Aortic Atherosclerosis (ICD10-I70.0). Electronically Signed   By: Virgina Norfolk M.D.   On: 01/26/2020 22:49   CT Abdomen Pelvis W Contrast  Result Date: 01/06/2020 CLINICAL DATA:  Inpatient. Follow-up severe acute pancreatitis status post ERCP 12/06/2019 and 12/11/2019 with stent placement. Nausea and vomiting for 3 days. Abdominal pain for several months. EXAM: CT ABDOMEN AND PELVIS WITH CONTRAST TECHNIQUE: Multidetector CT imaging of the abdomen and pelvis was performed using the standard protocol following bolus administration of intravenous contrast. CONTRAST:  184mL OMNIPAQUE IOHEXOL 300 MG/ML  SOLN COMPARISON:  12/08/2019 CT abdomen/pelvis. FINDINGS: Motion degraded scan limits assessment. Lower chest: No significant pulmonary nodules or acute consolidative airspace disease. Hepatobiliary: There is heterogeneous enhancement throughout the liver  with relative central hypoenhancement, with mild interval generalized enlargement of the hypoenhancing portion of the liver. No discrete liver masses. No radiopaque cholelithiasis. No definite gallbladder wall thickening. Gallbladder is mildly distended. Mild diffuse intrahepatic biliary ductal dilatation is similar. Normal caliber common bile duct (3 mm diameter). Pancreas: Pancreatic duct stent is in place with the distal portion in descending duodenal lumen. No pancreatic duct dilation. No discrete pancreatic mass. Pancreatic parenchymal enhancement is preserved. Pancreatic and peripancreatic edema appears decreased. Spleen: Normal size. No mass. Adrenals/Urinary Tract: Stable mildly irregular left adrenal contour without discrete adrenal nodules. No contour deforming renal masses. No hydronephrosis. Normal nondistended bladder. Stomach/Bowel: Increased smooth circumferential apparent wall thickening in the gastric antrum. Normal caliber small bowel with no small bowel wall thickening. Appendectomy. Normal large bowel with no diverticulosis, large bowel wall thickening or pericolonic fat stranding. Vascular/Lymphatic: Atherosclerotic nonaneurysmal abdominal aorta. There is high-grade narrowing of the SMV adjacent to the portal splenic venous confluence with heterogeneous poor SMV enhancement, similar to slightly improved, suspect evolving near occlusive SMV thrombosis. Improved enhancement of the main portal and right portal veins, with decreased nonocclusive main portal and right portal vein thrombosis. Persistent occlusive thrombosis of the left portal vein with interval enlargement of numerous periportal venous collaterals. Patent splenic, renal and hepatic veins. Mildly enlarged 1.2 cm left para-aortic lymph node is stable. No new pathologically enlarged abdominopelvic lymph nodes. Reproductive: Normal size prostate. Other: No pneumoperitoneum. Resolved ascites. Resolved lesser sac fluid collections  along the  proximal greater curvature of the stomach. No new or residual measurable fluid collections. Musculoskeletal: No aggressive appearing focal osseous lesions. Mild thoracolumbar spondylosis. IMPRESSION: 1. Persistent occlusive left portal venous thrombosis with interval enlargement of numerous periportal venous collaterals. Decreased nonocclusive thrombosis of the main and right portal veins. Evolving suspected near occlusive SMV thrombosis with high-grade SMV stenosis near the portal splenic venous confluence. 2. Heterogeneous central liver enhancement and enlargement, suspected to be due to altered perfusion. No discrete liver masses. 3. Mild diffuse intrahepatic biliary ductal dilatation with normal caliber CBD, unchanged. 4. Otherwise improved findings of pancreatitis. Peripancreatic fluid collections and ascites have resolved. Well-positioned pancreatic duct stent. 5. Apparent increased smooth circumferential wall thickening in the gastric antrum, nonspecific, cannot exclude antral gastritis. No free air. 6.  Aortic Atherosclerosis (ICD10-I70.0). Electronically Signed   By: Delbert Phenix M.D.   On: 01/06/2020 16:52   DG Abd Portable 1V  Result Date: 01/09/2020 CLINICAL DATA:  Abdominal pain with nausea and vomiting EXAM: PORTABLE ABDOMEN - 1 VIEW COMPARISON:  01/06/2020 FINDINGS: Scattered large and small bowel gas is noted. Pancreatic stent is again seen and stable. No free air is noted. No abnormal mass or abnormal calcifications are seen. IMPRESSION: No acute abnormality noted. Electronically Signed   By: Alcide Clever M.D.   On: 01/09/2020 09:10   DG Basil Dess Tube Plc W/Fl W/Rad  Result Date: 01/27/2020 CLINICAL DATA:  Gastric outlet obstruction need for nasogastric tube. EXAM: NASO G TUBE PLACEMENT WITH FL AND WITH RAD CONTRAST:  10 cc water-soluble contrast FLUOROSCOPY TIME:  Fluoroscopy Time:  1 minutes 6 seconds Radiation Exposure Index (if provided by the fluoroscopic device): 4.3 mGy Number of  Acquired Spot Images: 0 COMPARISON:  Abdomen pelvis CT 01/26/2020 FINDINGS: Under real-time fluoroscopic guidance a nasogastric tube was advanced into the stomach, tip in the distal body of the stomach. 10 cc of water-soluble contrast was injected to confirm positioning. The stomach appear distended as seen on recent CT and incidental note is made of a pancreatic stent in the right upper quadrant. IMPRESSION: 1. Satisfactory position of nasogastric tube, position verified with fluoroscopy, and with injection of small amount of water-soluble contrast. 2. Gastric distention. 3. Pancreatic stent. Electronically Signed   By: Donzetta Kohut M.D.   On: 01/27/2020 17:41     Labs: BNP (last 3 results) No results for input(s): BNP in the last 8760 hours. Basic Metabolic Panel: Recent Labs  Lab 01/27/20 0509 01/28/20 0145 01/29/20 0206 01/30/20 0455 01/31/20 0422  NA 134* 134* 137 135 139  K 2.7* 3.3* 3.5 4.1 3.6  CL 85* 92* 100 100 103  CO2 34* 31 25 25 29   GLUCOSE 101* 83 63* 105* 91  BUN 54* 37* 25* 17 10  CREATININE 1.90* 1.39* 1.03 0.87 0.82  CALCIUM 8.2* 8.8* 8.7* 8.5* 8.9  MG  --  1.9  --   --   --    Liver Function Tests: Recent Labs  Lab 01/27/20 0509 01/28/20 0145 01/29/20 0206 01/30/20 0455 01/31/20 0422  AST 23 29 22 20 23   ALT 30 32 27 23 25   ALKPHOS 117 108 99 90 108  BILITOT 0.9 1.4* 1.9* 0.9 0.6  PROT 6.6 6.4* 6.1* 5.9* 6.2*  ALBUMIN 3.9 3.7 3.6 3.4* 3.7   Recent Labs  Lab 01/26/20 2029 01/27/20 0509  LIPASE 97* 72*   No results for input(s): AMMONIA in the last 168 hours. CBC: Recent Labs  Lab 01/29/20 0206 01/30/20 0455 01/31/20  0422 02/01/20 0415 02/02/20 0800  WBC 5.5 4.1 5.4 5.2 5.5  HGB 13.9 13.2 14.3 13.1 14.8  HCT 41.6 39.6 43.7 39.4 44.5  MCV 91.8 91.7 93.8 92.5 93.7  PLT 135* 144* 139* 120* 118*   Cardiac Enzymes: Recent Labs  Lab 01/26/20 1840  CKTOTAL 32*  CKMB 0.4*   BNP: Invalid input(s): POCBNP CBG: No results for input(s):  GLUCAP in the last 168 hours. D-Dimer No results for input(s): DDIMER in the last 72 hours. Hgb A1c No results for input(s): HGBA1C in the last 72 hours. Lipid Profile No results for input(s): CHOL, HDL, LDLCALC, TRIG, CHOLHDL, LDLDIRECT in the last 72 hours. Thyroid function studies No results for input(s): TSH, T4TOTAL, T3FREE, THYROIDAB in the last 72 hours.  Invalid input(s): FREET3 Anemia work up No results for input(s): VITAMINB12, FOLATE, FERRITIN, TIBC, IRON, RETICCTPCT in the last 72 hours. Urinalysis    Component Value Date/Time   COLORURINE AMBER (A) 01/27/2020 0008   APPEARANCEUR CLOUDY (A) 01/27/2020 0008   LABSPEC 1.018 01/27/2020 0008   PHURINE 5.0 01/27/2020 0008   GLUCOSEU 50 (A) 01/27/2020 0008   HGBUR NEGATIVE 01/27/2020 0008   BILIRUBINUR SMALL (A) 01/27/2020 0008   KETONESUR 5 (A) 01/27/2020 0008   PROTEINUR 30 (A) 01/27/2020 0008   UROBILINOGEN 1.0 01/13/2008 2059   NITRITE NEGATIVE 01/27/2020 0008   LEUKOCYTESUR NEGATIVE 01/27/2020 0008   Sepsis Labs Invalid input(s): PROCALCITONIN,  WBC,  LACTICIDVEN Microbiology Recent Results (from the past 240 hour(s))  SARS CORONAVIRUS 2 (TAT 6-24 HRS) Nasopharyngeal Nasopharyngeal Swab     Status: None   Collection Time: 01/26/20 10:11 PM   Specimen: Nasopharyngeal Swab  Result Value Ref Range Status   SARS Coronavirus 2 NEGATIVE NEGATIVE Final    Comment: (NOTE) SARS-CoV-2 target nucleic acids are NOT DETECTED. The SARS-CoV-2 RNA is generally detectable in upper and lower respiratory specimens during the acute phase of infection. Negative results do not preclude SARS-CoV-2 infection, do not rule out co-infections with other pathogens, and should not be used as the sole basis for treatment or other patient management decisions. Negative results must be combined with clinical observations, patient history, and epidemiological information. The expected result is Negative. Fact Sheet for  Patients: HairSlick.no Fact Sheet for Healthcare Providers: quierodirigir.com This test is not yet approved or cleared by the [place] FDA and  has been authorized for detection and/or diagnosis of SARS-CoV-2 by FDA under an Emergency Use Authorization (EUA). This EUA will remain  in effect (meaning this test can be used) for the duration of the COVID-19 declaration under Section 56 4(b)(1) of the Act, 21 U.S.C. section 360bbb-3(b)(1), unless the authorization is terminated or revoked sooner. Performed at [facility], [address] [place], [place] [address]      Time coordinating discharge:  25 minutes  SIGNED: Lanae Boast, MD  [facility] 02/02/2020, 11:02 AM  If 7PM-7AM, please contact night-coverage [email or web address]

## 2020-02-02 NOTE — Progress Notes (Signed)
Patient reports after having abrupt abdominal pains meals and other PO's. Pain medication administered. Will continue to monitor.

## 2020-02-02 NOTE — Progress Notes (Addendum)
ANTICOAGULATION CONSULT NOTE  Pharmacy Consult for Heparin Indication: Portal vein thrombosis  Allergies  Allergen Reactions  . Penicillins Rash    Has patient had a PCN reaction causing immediate rash, facial/tongue/throat swelling, SOB or lightheadedness with hypotension: yes Has patient had a PCN reaction causing severe rash involving mucus membranes or skin necrosis: Yes Has patient had a PCN reaction that required hospitalization: No Has patient had a PCN reaction occurring within the last 10 years: No If all of the above answers are "NO", then may proceed with Cephalosporin use.     Patient Measurements: Height: 5\' 9"  (175.3 cm) Weight: 141 lb 5 oz (64.1 kg) IBW/kg (Calculated) : 70.7 Heparin Dosing Weight: total body weight = 64 kg  Vital Signs: Temp: 97.9 F (36.6 C) (02/14 0530) Temp Source: Oral (02/14 0530) BP: 146/93 (02/14 0840) Pulse Rate: 61 (02/14 0840)  Labs: Recent Labs    01/31/20 0422 01/31/20 1038 02/01/20 0415 02/01/20 0415 02/01/20 1439 02/01/20 2030 02/02/20 0800  HGB 14.3  --  13.1  --   --   --  14.8  HCT 43.7  --  39.4  --   --   --  44.5  PLT 139*  --  120*  --   --   --  118*  HEPARINUNFRC 0.39   < > 0.17*   < > 0.35 0.31 0.47  CREATININE 0.82  --   --   --   --   --   --    < > = values in this interval not displayed.    Estimated Creatinine Clearance: 109.7 mL/min (by C-G formula based on SCr of 0.82 mg/dL).   Medical History: Past Medical History:  Diagnosis Date  . Abdominal pain   . Mesenteric vein thrombosis (HCC)    Dec 2020 in setting of pancreatitis  . Nausea & vomiting   . Pancreatitis     Medications:  PTA Eliquis 5mg  BID - LD 01/27/20 @ 05:47  Assessment: 39 y/o male admitted with nausea/vomiting/abdominal pain;acute pancreatitis. PMH significant for alcoholic pancreatitis and portal vein thrombosis (on Eliquis PTA). Pt currently NPO, GI consulted. Eliquis being held, pharmacy consulted to dose/monitor IV  heparin.  Baseline labs: -CBC: Hgb 15.3, Plt 214 -HL 1.48, falsely elevated due to Eliquis  Significant Events:  -EGD on 2/10, heparin resumed post-procedure  Today, 02/02/20  Heparin level = 0.47 is therapeutic on heparin infusion of 1500 units/hr  Confirmed with RN that heparin infusing at correct rate. No signs of bleeding.  CBC: Hgb WNL. Pltc 118K, remain low but stable compared to yesterday  Goal of Therapy:  Heparin level 0.3-0.7 units/ml Monitor platelets by anticoagulation protocol: Yes   Plan:   Continue heparin infusion at current rate of 1500 units/hr  Check confirmatory HL in 6 hours  Daily CBC and heparin level while on IV heparin  Follow along for ability to resume PO anticoagulation, currently planning to be resumed at discharge  4/10, PharmD 02/02/20 10:47 AM  Addendum: Pharmacy consulted to transition anticoagulation from heparin infusion back to home apixaban dose this morning.  Plan:  -Discontinue heparin at time of apixaban initiation -Resume apixaban 5 mg PO BID -CBC ordered with AM labs tomorrow  Cindi Carbon, PharmD 02/02/20 11:06 AM

## 2020-02-03 ENCOUNTER — Telehealth: Payer: Self-pay

## 2020-02-03 NOTE — Telephone Encounter (Signed)
Transition Care Management Follow-up Telephone Call Date of discharge and from where: 02/02/2020, [facility].   Call placed to patient # [phone], message left with call back requested to this CM.  Patient has an appt at [facility] tomorrow -  02/04/2020 @ 0930

## 2020-02-03 NOTE — Telephone Encounter (Signed)
Transition Care Management Follow-up Telephone Call  Date of discharge and from where: 02/02/2020, [facility]   How have you been since you were released from the hospital? He said he is doing okay, trying to sleep  Any questions or concerns? His concerns were regarding his diet and his diagnoses and he noted that he really need to speak to GI.  He said he is in the process of checking insurance options during the current open enrollment period.  He said that he has been tolerating a liquid and also has eaten grits and pudding. No GI complaints at this time.   Items Reviewed:  Did the pt receive and understand the yes he has the instructions, no questions at this time  Medications obtained and verified? He said that he has to get the maalox, he has the oxycodone and the Ensure. He noted that he was given Ensure from the hospital. No  questions about his medications.   Any new allergies since your discharge? None reported   Do you have support at home? Lives with his mother  Other (ie: DME, Home Health, etc) no home health or DME ordered.   Functional Questionnaire: (I = Independent and D = Dependent) ADL's: indpendent  Follow up appointments reviewed:    PCP Hospital f/u appt confirmed? Yes, tomorrow - 02/04/2020 @ 0930, [facility]  Specialist Hospital f/u appt confirmed? Has appt with GI - 03/19/2020  Are transportation arrangements needed? no  If their condition worsens, is the pt aware to call  their PCP or go to the ED? Yes  Was the patient provided with contact information for the PCP's office or ED?he has the clinic phone number   Was the pt encouraged to call back with questions or concerns?yes

## 2020-02-04 ENCOUNTER — Encounter (INDEPENDENT_AMBULATORY_CARE_PROVIDER_SITE_OTHER): Payer: Self-pay | Admitting: Primary Care

## 2020-02-04 ENCOUNTER — Ambulatory Visit (INDEPENDENT_AMBULATORY_CARE_PROVIDER_SITE_OTHER): Payer: Self-pay | Admitting: Primary Care

## 2020-02-04 ENCOUNTER — Other Ambulatory Visit: Payer: Self-pay

## 2020-02-04 VITALS — BP 118/68 | HR 72 | Temp 97.3°F | Ht 70.0 in | Wt 139.4 lb

## 2020-02-04 DIAGNOSIS — Z09 Encounter for follow-up examination after completed treatment for conditions other than malignant neoplasm: Secondary | ICD-10-CM

## 2020-02-04 DIAGNOSIS — Z720 Tobacco use: Secondary | ICD-10-CM

## 2020-02-04 DIAGNOSIS — N179 Acute kidney failure, unspecified: Secondary | ICD-10-CM

## 2020-02-04 DIAGNOSIS — R634 Abnormal weight loss: Secondary | ICD-10-CM

## 2020-02-04 NOTE — Progress Notes (Signed)
Established Patient Office Visit  Subjective:  Patient ID: Paul Allen, male    DOB: Mar 27, 1981  Age: 39 y.o. MRN: [id number]  CC:  Chief Complaint  Patient presents with  . Acute Renal Failure    HPI Paul Allen presents for follow up on hospitalizations for acute kidney failure, pancreatitis and gastric outlet obstruction all on different encounter. Patient has not had any alcohol intake since October 2020 and is on Nicoderm gum for smoking cessation and is down to approximately  3 cigarettes a day may vary depending on stressors. He remains on a liquid diet and is not actually sure what this means.  Past Medical History:  Diagnosis Date  . Abdominal pain   . Mesenteric vein thrombosis (HCC)    Dec 2020 in setting of pancreatitis  . Nausea & vomiting   . Pancreatitis     Past Surgical History:  Procedure Laterality Date  . APPENDECTOMY    . BIOPSY  01/29/2020   Procedure: BIOPSY;  Surgeon: Rachael Fee, MD;  Location: [facility];  Service: Gastroenterology;;  . ERCP N/A 12/06/2019   Procedure: ENDOSCOPIC RETROGRADE CHOLANGIOPANCREATOGRAPHY (ERCP);  Surgeon: Rachael Fee, MD;  Location: [facility];  Service: Endoscopy;  Laterality: N/A;  Aborted Procedure  . ERCP N/A 12/11/2019   Procedure: ENDOSCOPIC RETROGRADE CHOLANGIOPANCREATOGRAPHY (ERCP);  Surgeon: Lemar Lofty., MD;  Location: [facility];  Service: Gastroenterology;  Laterality: N/A;  . ESOPHAGOGASTRODUODENOSCOPY (EGD) WITH PROPOFOL N/A 01/29/2020   Procedure: ESOPHAGOGASTRODUODENOSCOPY (EGD) WITH PROPOFOL;  Surgeon: Rachael Fee, MD;  Location: [facility];  Service: Gastroenterology;  Laterality: N/A;  . PANCREATIC STENT PLACEMENT  12/11/2019   Procedure: PANCREATIC STENT PLACEMENT;  Surgeon: Lemar Lofty., MD;  Location: [facility];  Service: Gastroenterology;;  . Dennison Mascot  12/11/2019   Procedure: Dennison Mascot;  Surgeon: Meridee Score Netty Starring., MD;  Location:  [facility];  Service: Gastroenterology;;    Family History  Problem Relation Age of Onset  . Cholecystitis Mother   . Arthritis Mother   . Stomach cancer Father     Social History   Socioeconomic History  . Marital status: Single    Spouse name: Not on file  . Number of children: Not on file  . Years of education: Not on file  . Highest education level: Not on file  Occupational History  . Not on file  Tobacco Use  . Smoking status: Current Every Day Smoker    Packs/day: 1.50    Types: Cigarettes  . Smokeless tobacco: Never Used  Substance and Sexual Activity  . Alcohol use: Not Currently    Comment: hasn't drank in 4 weeks, but before would drink about 5 malt beers a day  . Drug use: Yes    Frequency: 7.0 times per week    Types: Marijuana  . Sexual activity: Not on file  Other Topics Concern  . Not on file  Social History Narrative   Single no kids   Uninsured   Smoker - tobacco and marijuana   Alcohol - hx - stopped last time in 11/2019 w/ pancreatitis   Social Determinants of Health   Financial Resource Strain:   . Difficulty of Paying Living Expenses: Not on file  Food Insecurity:   . Worried About Programme researcher, broadcasting/film/video in the Last Year: Not on file  . Ran Out of Food in the Last Year: Not on file  Transportation Needs:   . Lack of Transportation (Medical): Not on file  . Lack of  Transportation (Non-Medical): Not on file  Physical Activity:   . Days of Exercise per Week: Not on file  . Minutes of Exercise per Session: Not on file  Stress:   . Feeling of Stress : Not on file  Social Connections:   . Frequency of Communication with Friends and Family: Not on file  . Frequency of Social Gatherings with Friends and Family: Not on file  . Attends Religious Services: Not on file  . Active Member of Clubs or Organizations: Not on file  . Attends Banker Meetings: Not on file  . Marital Status: Not on file  Intimate Partner Violence:   . Fear  of Current or Ex-Partner: Not on file  . Emotionally Abused: Not on file  . Physically Abused: Not on file  . Sexually Abused: Not on file    Outpatient Medications Prior to Visit  Medication Sig Dispense Refill  . apixaban (ELIQUIS) 5 MG TABS tablet Take 1 tablet (5 mg total) by mouth 2 (two) times daily.    . feeding supplement, ENSURE ENLIVE, (ENSURE ENLIVE) LIQD Take 237 mLs by mouth 3 (three) times daily between meals for 7 days. 4977 mL 0  . folic acid (FOLVITE) 1 MG tablet Take 1 tablet (1 mg total) by mouth daily.    . ondansetron (ZOFRAN-ODT) 4 MG disintegrating tablet Take 1 tablet (4 mg total) by mouth every 8 (eight) hours as needed for up to 30 doses for nausea. 30 tablet 0  . oxyCODONE (ROXICODONE) 5 MG immediate release tablet Take 1 tablet (5 mg total) by mouth every 6 (six) hours as needed for up to 5 days for severe pain. 20 tablet 0  . pantoprazole (PROTONIX) 40 MG tablet Take 1 tablet (40 mg total) by mouth 2 (two) times daily. 60 tablet 0  . thiamine 100 MG tablet Take 1 tablet (100 mg total) by mouth daily.    Marland Kitchen alum & mag hydroxide-simeth (MAALOX/MYLANTA) 200-200-20 MG/5ML suspension Take 30 mLs by mouth every 4 (four) hours as needed for indigestion or heartburn (gas pain). (Patient not taking: Reported on 02/04/2020) 355 mL 0   Facility-Administered Medications Prior to Visit  Medication Dose Route Frequency Provider Last Rate Last Admin  . nicotine polacrilex (NICORETTE) gum 2 mg  2 mg Oral PRN Grayce Sessions, NP        Allergies  Allergen Reactions  . Penicillins Rash    Has patient had a PCN reaction causing immediate rash, facial/tongue/throat swelling, SOB or lightheadedness with hypotension: yes Has patient had a PCN reaction causing severe rash involving mucus membranes or skin necrosis: Yes Has patient had a PCN reaction that required hospitalization: No Has patient had a PCN reaction occurring within the last 10 years: No If all of the above answers  are "NO", then may proceed with Cephalosporin use.     ROS Review of Systems  Constitutional: Positive for fatigue and unexpected weight change.  Gastrointestinal: Positive for abdominal pain, constipation, nausea and vomiting.       Abdominal pain especially after meals  Genitourinary: Positive for frequency.  Neurological: Positive for weakness.  Psychiatric/Behavioral: Positive for sleep disturbance. The patient is nervous/anxious.   All other systems reviewed and are negative.     Objective:    Physical Exam  Constitutional: He is oriented to person, place, and time. He appears well-developed and well-nourished.  Thin frame  HENT:  Head: Normocephalic.  Eyes: Pupils are equal, round, and reactive to light. EOM are normal.  Cardiovascular: Normal rate and regular rhythm.  Pulmonary/Chest: Effort normal and breath sounds normal.  Abdominal: Soft.  Musculoskeletal:        General: Normal range of motion.     Cervical back: Normal range of motion and neck supple.  Neurological: He is oriented to person, place, and time. He has normal reflexes.  Skin: Skin is warm and dry.  Psychiatric: He has a normal mood and affect. His behavior is normal. Judgment and thought content normal.    BP 118/68 (BP Location: Right Arm, Patient Position: Sitting, Cuff Size: Normal)   Pulse 72   Temp (!) 97.3 F (36.3 C) (Temporal)   Ht 5\' 10"  (1.778 m)   Wt 139 lb 6.4 oz (63.2 kg)   SpO2 100%   BMI 20.00 kg/m  Wt Readings from Last 3 Encounters:  02/04/20 139 lb 6.4 oz (63.2 kg)  02/02/20 141 lb 5 oz (64.1 kg)  01/07/20 154 lb 9.6 oz (70.1 kg)     There are no preventive care reminders to display for this patient.  There are no preventive care reminders to display for this patient.  No results found for: TSH Lab Results  Component Value Date   WBC 5.5 02/02/2020   HGB 14.8 02/02/2020   HCT 44.5 02/02/2020   MCV 93.7 02/02/2020   PLT 118 (L) 02/02/2020   Lab Results   Component Value Date   NA 139 01/31/2020   K 3.6 01/31/2020   CO2 29 01/31/2020   GLUCOSE 91 01/31/2020   BUN 10 01/31/2020   CREATININE 0.82 01/31/2020   BILITOT 0.6 01/31/2020   ALKPHOS 108 01/31/2020   AST 23 01/31/2020   ALT 25 01/31/2020   PROT 6.2 (L) 01/31/2020   ALBUMIN 3.7 01/31/2020   CALCIUM 8.9 01/31/2020   ANIONGAP 7 01/31/2020   GFR 155.89 11/28/2019   Lab Results  Component Value Date   CHOL 162 07/05/2019   Lab Results  Component Value Date   HDL 46 07/05/2019   Lab Results  Component Value Date   LDLCALC 66 07/05/2019   Lab Results  Component Value Date   TRIG 252 (H) 07/05/2019   Lab Results  Component Value Date   CHOLHDL 3.5 07/05/2019   Lab Results  Component Value Date   HGBA1C 5.3 12/14/2019      Assessment & Plan:  Talbot was seen today for acute renal failure.  Diagnoses and all orders for this visit:  Hospital discharge follow-up From 1/19- 02/07/2020 separate admissions acute kidney failure resolved reviewed labs from 01/31/2020, pancreatitis-alcohol-induced pancreatitis with necrosis   and gastric outlet obstruction-endoscopy 2/10-found to have gastric outlet obstruction at the level of pylorus with edematous inflammatory congestion and stenotic, distal gastritis.  Tobacco abuse He is well aware of increased risk for lung cancer and other respiratory diseases recommend cessation. He is weaning his self off with help of Nicoderm gum. This will be reminded at each clinical visit.  Loss of weight He is on a full liquid diet but unclear of what this actually means we discussed foods that he can eat and that is easily digestible and on AVS examples of meals and foods given. Patient very appreciative. Very concern with weight loss significant 12/25/2019 155lbs today 102 lbs with complications of several GI problems not including n/v and diarrhea will create weight loss. Encourage ensure supplements that he is drinking, protein shakes and  foods/liquids on full liqid diet until follow up appointment with GI.  TSH + free T4    Follow-up: Return if symptoms worsen or fail to improve.    Grayce Sessions, NP

## 2020-02-04 NOTE — Patient Instructions (Signed)
Full Liquid Diet A full liquid diet refers to fluids and foods that are liquid or will become liquid at room temperature. This diet should only be used for a short period of time to help you recover from illness or surgery. Your health care provider or dietitian will help you determine when it is safe to eat regular foods. What are tips for following this plan?     Reading food labels  Check food labels of nutrition shakes for the amount of protein. Look for nutrition shakes that have at least 8-10 grams of protein in each serving.  Look for drinks, such as milks and juices, that are "fortified" or "enriched." This means that vitamins and minerals have been added. Shopping  Buy premade nutritional shakes to keep on hand.  To vary your choices, buy different flavors of milks and shakes. Meal planning  Choose flavors and foods that you enjoy.  To make sure you get enough energy from food (calories): ? Eat 3 full liquid meals each day. Have a liquid snack between each meal. ? Drink 6-8 ounces (177-237 ml) of a nutrition supplement shake with meals or as snacks. ? Add protein powder, powdered milk, milk, or yogurt to shakes to increase the amount of protein.  Drink at least one serving a day of citrus fruit juice or fruit juice that has vitamin C added. General guidelines  Before starting the full-liquid diet, check with your health care provider to know what foods you should avoid. These may include full-fat or high-fiber liquids.  You may have any liquid or food that becomes a liquid at room temperature. The food is considered a liquid if it can be poured off a spoon at room temperature.  Do not drink alcohol unless approved by your health care provider.  This diet gives you most of the nutrients that you need for energy, but you may not get enough of certain vitamins, minerals, and fiber. Make sure to talk to your health care provider or dietitian about: ? How many calories you need  to eat get day. ? How much fluid you should have each day. ? Taking a multivitamin or a nutritional supplement. What foods are allowed? The items listed may not be a complete list. Talk with your dietitian about what dietary choices are best for you. Grains Thin hot cereal, such as cream of wheat. Soft-cooked pasta or rice pured in soup. Vegetables Pulp-free tomato or vegetable juice. Vegetables pured in soup. Fruits Fruit juice without pulp. Strained fruit pures (seeds and skins removed). Meats and other protein foods Beef, chicken, and fish broths. Powdered protein supplements. Dairy Milk and milk-based beverages, including milk shakes and instant breakfast mixes. Smooth yogurt. Pured cottage cheese. Beverages Water. Coffee and tea (caffeinated or decaffeinated). Cocoa. Liquid nutritional supplements. Soft drinks. Nondairy milks, such as almond, coconut, rice, or soy milk. Fats and oils Melted margarine and butter. Cream. Canola, almond, avocado, corn, grapeseed, sunflower, and sesame oils. Gravy. Sweets and desserts Custard. Pudding. Flavored gelatin. Smooth ice cream (without nuts or candy pieces). Sherbet. Popsicles. Italian ice. Pudding pops. Seasoning and other foods Salt and pepper. Spices. Cocoa powder. Vinegar. Ketchup. Yellow mustard. Smooth sauces, such as Hollandaise, cheese sauce, or white sauce. Soy sauce. Cream soups. Strained soups. Syrup. Honey. Jelly (without fruit pieces). What foods are not allowed? The items listed may not be a complete list. Talk with your dietitian about what dietary choices are best for you. Grains Whole grains. Pasta. Rice. Cold cereal. Bread. Crackers.   Vegetables All whole fresh, frozen, or canned vegetables. Fruits All whole fresh, frozen, or canned fruits. Meats and other protein foods All cuts of meat, poultry, and fish. Eggs. Tofu and soy protein. Nuts and nut butters. Lunch meat. Sausage. Dairy Hard cheese. Yogurt with fruit  chunks. Fats and oils Coconut oil. Palm oil. Lard. Cold butter. Sweets and desserts Ice cream or other frozen desserts that have any solids in them or on top, such as nuts, chocolate chips, and pieces of cookies. Cakes. Cookies. Candy. Seasoning and other foods Stone-ground mustards. Soups with chunks or pieces. Summary  A full liquid diet refers to fluids and foods that are liquid or will become liquid at room temperature.  This diet should only be used for a short period of time to help you recover from illness or surgery. Ask your health care provider or dietitian when it is safe for you to eat regular foods.  To make sure you get enough calories and nutrients, eat 3 meals each day with snacks between. Drink premade nutrition supplement shakes or add protein powder to homemade shakes. Take a vitamin and mineral supplement as told by your health care provider. This information is not intended to replace advice given to you by your health care provider. Make sure you discuss any questions you have with your health care provider. Document Revised: 03/03/2018 Document Reviewed: 01/18/2017 Elsevier Patient Education  2020 Elsevier Inc.  

## 2020-02-05 ENCOUNTER — Telehealth (INDEPENDENT_AMBULATORY_CARE_PROVIDER_SITE_OTHER): Payer: Self-pay

## 2020-02-05 LAB — TSH+FREE T4
Free T4: 1.4 ng/dL (ref 0.82–1.77)
TSH: 0.951 u[IU]/mL (ref 0.450–4.500)

## 2020-02-05 NOTE — Telephone Encounter (Signed)
Patient is aware that his thyroid is normal and is not the cause of his weight loss. Maryjean Morn, CMA

## 2020-02-05 NOTE — Telephone Encounter (Signed)
-----   Message from Grayce Sessions, NP sent at 02/05/2020  1:36 PM EST ----- Thyroid normal not the cause of weight loss

## 2020-02-12 ENCOUNTER — Encounter (HOSPITAL_COMMUNITY): Payer: Self-pay

## 2020-02-12 ENCOUNTER — Ambulatory Visit (HOSPITAL_COMMUNITY): Admit: 2020-02-12 | Payer: MEDICAID | Admitting: Gastroenterology

## 2020-02-12 SURGERY — ENDOSCOPIC RETROGRADE CHOLANGIOPANCREATOGRAPHY (ERCP) WITH PROPOFOL
Anesthesia: General

## 2020-02-13 ENCOUNTER — Other Ambulatory Visit: Payer: Self-pay | Admitting: Gastroenterology

## 2020-02-14 ENCOUNTER — Telehealth: Payer: Self-pay | Admitting: Gastroenterology

## 2020-02-14 MED ORDER — TRAMADOL HCL 50 MG PO TABS: 50.0000 mg | ORAL_TABLET | Freq: Four times a day (QID) | ORAL | 0 refills | Status: AC | PRN

## 2020-02-14 NOTE — Telephone Encounter (Signed)
Patient states he is still having a lot of pain and discomfort. He did have an ERCP scheduled for 02-12-2020. But was not completed due inflammation. He is requesting a refill on Tramadol. Please advise

## 2020-02-14 NOTE — Telephone Encounter (Signed)
Patient is a 39 yo male well known to the GI service for recent admissions, calling to request Tramadol refill. Called office earlier, but no reply. Was last filled on hospital d/c in December, last taken in January. Pain earlier this week, not relived with APAP. Previously prescribed oxycodone after recent hospital d/c, but states that was too strong/sedating, and requesting refill of Tramadol.   Otherwise tolerating PO intake (maintaing liquid diet only), and no fever, chills, GI blood loss. Has appt with Dr. Meridee Score on 03/19/20 for pancreatic stent removal.   Given known pancreatic/intraabdominal disease and good response to this medication in the past, will approve limited supply refill (5 days) today.   Will call office Monday to schedule f/u appt with his primary GI, Dr. Myrtie Neither.   All questions answered and medication sent in electronically. No refills.

## 2020-02-17 ENCOUNTER — Other Ambulatory Visit (INDEPENDENT_AMBULATORY_CARE_PROVIDER_SITE_OTHER): Payer: Self-pay | Admitting: Primary Care

## 2020-02-17 ENCOUNTER — Other Ambulatory Visit: Payer: Self-pay | Admitting: *Deleted

## 2020-02-17 ENCOUNTER — Telehealth (INDEPENDENT_AMBULATORY_CARE_PROVIDER_SITE_OTHER): Payer: Self-pay

## 2020-02-17 ENCOUNTER — Telehealth: Payer: Self-pay

## 2020-02-17 DIAGNOSIS — K859 Acute pancreatitis without necrosis or infection, unspecified: Secondary | ICD-10-CM

## 2020-02-17 DIAGNOSIS — K529 Noninfective gastroenteritis and colitis, unspecified: Secondary | ICD-10-CM

## 2020-02-17 MED FILL — !ELIQUIS 5MG TABLET: 5 | 30 days supply | Qty: 60 | Fill #1

## 2020-02-17 NOTE — Telephone Encounter (Signed)
Patient called wanting to inform PCP that his insurance kicked in today and wanted to know if she could place the referral to gastroenterologist with [facility]. Patient states with provider he will like to see at [facility] is with MD. Rayann Heman.  Phone number to [facility] is [phone]

## 2020-02-17 NOTE — Telephone Encounter (Signed)
Sent to PCP ?

## 2020-02-17 NOTE — Telephone Encounter (Signed)
Apologies for the delayed reply - I was out of the office the day this call came in.  Looks like Dr. Barron Alvine sent a refill for the tramadol on 2/26.  In addition, we need to schedule an Upper GI Series xray (no small bowel follow through needed) this week to evaluate his gastric outlet obstruction.  Based on that report, I can decide if he needs a surgical consult to reconsider the jejunal feeding tube we discussed in the hospital, and to determine when the ERCP will be feasible.

## 2020-02-17 NOTE — Telephone Encounter (Signed)
-----   Message from Loretha Stapler, RN sent at 12/16/2019  3:05 PM EST ----- Alexia Freestone, please place a follow-up ERCP for 2 months from now tentatively. Thanks. GM

## 2020-02-17 NOTE — Telephone Encounter (Signed)
The pt has an appt to discuss ERCP with [name]

## 2020-02-18 ENCOUNTER — Other Ambulatory Visit: Payer: Self-pay | Admitting: *Deleted

## 2020-02-18 ENCOUNTER — Telehealth: Payer: Self-pay | Admitting: Gastroenterology

## 2020-02-18 DIAGNOSIS — K859 Acute pancreatitis without necrosis or infection, unspecified: Secondary | ICD-10-CM

## 2020-02-18 NOTE — Telephone Encounter (Signed)
Is this referral appropriate as you are the primary GI MD? Please review and advise

## 2020-02-18 NOTE — Telephone Encounter (Signed)
Order changed to DG UGI, scheduled at [facility] on 3/10 at 9:30 am (9:15 am arrival). NPO 3 hours prior. Called patient, left detailed VM.

## 2020-02-18 NOTE — Telephone Encounter (Signed)
Pt called requesting a referral to Dr. Sharyn Lull at [facility]. Pt states that Dr. Nanda Quinton will not see patient unless he is referred by [place]. Pt states that he specializes in pancreatic and biliary diseases. Pls call pt with any questions.

## 2020-02-19 NOTE — Telephone Encounter (Signed)
Spoke to him on the phone for about 20 minutes this evening.  Can keep down ice cream, mild, grits, yogurt, potato leek soup, ensure (at least 3-4/day)  He is going to start making his own protein shakes at home to cut expenses.  Weight seems stable lately.  Only vomited once last week.  The plan that I outlined with him was for the upper GI series, but I would also like him to have a KUB at the same time to see if the pancreatic stent is still present. He should have the KUB done first (before swallowing barium), and then the upper GI series to follow same session.  I told him that so he could be sure to tell the radiology technicians.  The order is important, otherwise the barium might obscure utilization of a pancreatic stent, as they are quite small.  So please also place an order for KUB, make it clear he wanted to the same day, and it would be ideal if those could be moved up to a sooner date if the schedule allows.  When we see the upper GI series images, then Dr. Meridee Score can determine the stability and timing of an attempt at upper endoscopy to remove the pancreatic duct stent, if still present on the KUB.  Taris would still like a referral to Dr. Nanda Quinton at [facility], and I am certainly agreeable.  He feels he would like another opinion and another specialist on board in case additional help is needed.  That referral should be sent with our original office consult note, last few discharge summaries, last 3 CT abdomen and pelvis reports, ERCP and upper endoscopy reports of the last few months.

## 2020-02-20 ENCOUNTER — Other Ambulatory Visit: Payer: Self-pay | Admitting: *Deleted

## 2020-02-20 ENCOUNTER — Telehealth: Payer: Self-pay | Admitting: Gastroenterology

## 2020-02-20 DIAGNOSIS — K859 Acute pancreatitis without necrosis or infection, unspecified: Secondary | ICD-10-CM

## 2020-02-20 NOTE — Telephone Encounter (Signed)
I have already spoken with the patient.

## 2020-02-20 NOTE — Telephone Encounter (Signed)
Left another message informing the patient of his radiology appointment. Requested a call back to confirm. Awaiting phone call.

## 2020-02-20 NOTE — Telephone Encounter (Signed)
Fax receipt received stating the fax went through.

## 2020-02-20 NOTE — Telephone Encounter (Signed)
This is a [facility] patient-please assist with this patient

## 2020-02-20 NOTE — Telephone Encounter (Signed)
Patient is calling for an xray appt

## 2020-02-20 NOTE — Telephone Encounter (Signed)
Ordered the KUB for [facility], no appointment needed. Spoke with the patient who understands to arrive in radiology 30 minutes early for KUB first THEN upper GI series.   Referral printed and faxed to Dr. Emily Filbert [phone] with requested documentation as expressed by Dr. Myrtie Neither.

## 2020-02-26 ENCOUNTER — Other Ambulatory Visit: Payer: Self-pay

## 2020-02-26 ENCOUNTER — Ambulatory Visit (HOSPITAL_COMMUNITY)
Admission: RE | Admit: 2020-02-26 | Discharge: 2020-02-26 | Disposition: A | Discharge status: Home / Self Care | Payer: 59 | Source: Ambulatory Visit | Attending: Gastroenterology | Admitting: Gastroenterology

## 2020-02-26 ENCOUNTER — Ambulatory Visit (HOSPITAL_COMMUNITY): Payer: 59

## 2020-02-26 DIAGNOSIS — K859 Acute pancreatitis without necrosis or infection, unspecified: Secondary | ICD-10-CM | POA: Insufficient documentation

## 2020-02-27 ENCOUNTER — Other Ambulatory Visit: Payer: Self-pay

## 2020-02-27 DIAGNOSIS — Z4689 Encounter for fitting and adjustment of other specified devices: Secondary | ICD-10-CM

## 2020-03-02 ENCOUNTER — Telehealth (INDEPENDENT_AMBULATORY_CARE_PROVIDER_SITE_OTHER): Payer: Self-pay

## 2020-03-02 NOTE — Telephone Encounter (Signed)
Patient called to make a medication refill for   pantoprazole (PROTONIX) 40 MG tablet    Patient uses   [facility] [address] [place], [place] - [address] [place]  [address]  Please advice [phone]

## 2020-03-03 ENCOUNTER — Telehealth: Payer: Self-pay | Admitting: Gastroenterology

## 2020-03-03 MED ORDER — PANTOPRAZOLE SODIUM 40 MG PO TBEC: 40.0000 mg | DELAYED_RELEASE_TABLET | Freq: Every day | ORAL | 0 refills | Status: DC

## 2020-03-03 NOTE — Telephone Encounter (Signed)
Denied followed by gastrology patient needs to ask them for refilled

## 2020-03-03 NOTE — Telephone Encounter (Signed)
Sent to PCP ?

## 2020-03-03 NOTE — Telephone Encounter (Signed)
Patient is aware to contact GI.

## 2020-03-03 NOTE — Telephone Encounter (Signed)
Per the chart patient is to take Protonix 40 mg BID. Please advise on refills.

## 2020-03-03 NOTE — Telephone Encounter (Signed)
One a day for another month will be sufficient.  I refilled it that way.

## 2020-03-13 ENCOUNTER — Other Ambulatory Visit (HOSPITAL_COMMUNITY)
Admission: RE | Admit: 2020-03-13 | Discharge: 2020-03-13 | Disposition: A | Discharge status: Home / Self Care | Payer: 59 | Source: Ambulatory Visit | Attending: Gastroenterology | Admitting: Gastroenterology

## 2020-03-13 DIAGNOSIS — Z20822 Contact with and (suspected) exposure to covid-19: Secondary | ICD-10-CM | POA: Diagnosis not present

## 2020-03-13 DIAGNOSIS — Z01812 Encounter for preprocedural laboratory examination: Secondary | ICD-10-CM | POA: Insufficient documentation

## 2020-03-13 LAB — SARS CORONAVIRUS 2 (TAT 6-24 HRS): SARS Coronavirus 2: NEGATIVE

## 2020-03-17 ENCOUNTER — Encounter (HOSPITAL_COMMUNITY): Payer: Self-pay | Admitting: Gastroenterology

## 2020-03-17 ENCOUNTER — Ambulatory Visit (HOSPITAL_COMMUNITY)
Admission: RE | Admit: 2020-03-17 | Discharge: 2020-03-17 | Disposition: A | Discharge status: Home / Self Care | Payer: 59 | Attending: Gastroenterology | Admitting: Gastroenterology

## 2020-03-17 ENCOUNTER — Other Ambulatory Visit: Payer: Self-pay

## 2020-03-17 ENCOUNTER — Ambulatory Visit (HOSPITAL_COMMUNITY): Payer: 59 | Admitting: Registered Nurse

## 2020-03-17 ENCOUNTER — Encounter (HOSPITAL_COMMUNITY)
Admission: RE | Disposition: A | Discharge status: Home / Self Care | Payer: Self-pay | Source: Home / Self Care | Attending: Gastroenterology

## 2020-03-17 DIAGNOSIS — Z8719 Personal history of other diseases of the digestive system: Secondary | ICD-10-CM | POA: Insufficient documentation

## 2020-03-17 DIAGNOSIS — K228 Other specified diseases of esophagus: Secondary | ICD-10-CM

## 2020-03-17 DIAGNOSIS — Z8379 Family history of other diseases of the digestive system: Secondary | ICD-10-CM | POA: Insufficient documentation

## 2020-03-17 DIAGNOSIS — K3189 Other diseases of stomach and duodenum: Secondary | ICD-10-CM

## 2020-03-17 DIAGNOSIS — Z4689 Encounter for fitting and adjustment of other specified devices: Secondary | ICD-10-CM

## 2020-03-17 DIAGNOSIS — Z9889 Other specified postprocedural states: Secondary | ICD-10-CM | POA: Diagnosis not present

## 2020-03-17 DIAGNOSIS — Z800 Family history of malignant neoplasm of digestive organs: Secondary | ICD-10-CM | POA: Diagnosis not present

## 2020-03-17 DIAGNOSIS — F17210 Nicotine dependence, cigarettes, uncomplicated: Secondary | ICD-10-CM | POA: Diagnosis not present

## 2020-03-17 DIAGNOSIS — Z8261 Family history of arthritis: Secondary | ICD-10-CM | POA: Insufficient documentation

## 2020-03-17 DIAGNOSIS — K449 Diaphragmatic hernia without obstruction or gangrene: Secondary | ICD-10-CM | POA: Insufficient documentation

## 2020-03-17 DIAGNOSIS — Z4659 Encounter for fitting and adjustment of other gastrointestinal appliance and device: Secondary | ICD-10-CM | POA: Diagnosis not present

## 2020-03-17 DIAGNOSIS — Z880 Allergy status to penicillin: Secondary | ICD-10-CM | POA: Diagnosis not present

## 2020-03-17 DIAGNOSIS — Z4589 Encounter for adjustment and management of other implanted devices: Secondary | ICD-10-CM | POA: Insufficient documentation

## 2020-03-17 DIAGNOSIS — K2950 Unspecified chronic gastritis without bleeding: Secondary | ICD-10-CM | POA: Diagnosis not present

## 2020-03-17 DIAGNOSIS — K861 Other chronic pancreatitis: Secondary | ICD-10-CM | POA: Diagnosis present

## 2020-03-17 HISTORY — PX: STENT REMOVAL: SHX6421

## 2020-03-17 HISTORY — PX: BIOPSY: SHX5522

## 2020-03-17 HISTORY — PX: ESOPHAGOGASTRODUODENOSCOPY (EGD) WITH PROPOFOL: SHX5813

## 2020-03-17 SURGERY — ESOPHAGOGASTRODUODENOSCOPY (EGD) WITH PROPOFOL
Anesthesia: Monitor Anesthesia Care

## 2020-03-17 MED ORDER — SODIUM CHLORIDE 0.9 % IV SOLN: INTRAVENOUS | Status: DC

## 2020-03-17 MED ORDER — PROPOFOL 500 MG/50ML IV EMUL
INTRAVENOUS | Status: DC | PRN
  Administered 2020-03-17: 100 ug/kg/min via INTRAVENOUS

## 2020-03-17 MED ORDER — PANTOPRAZOLE SODIUM 40 MG PO TBEC: 40.0000 mg | DELAYED_RELEASE_TABLET | Freq: Two times a day (BID) | ORAL | 3 refills | Status: AC

## 2020-03-17 MED ORDER — LIDOCAINE 2% (20 MG/ML) 5 ML SYRINGE
INTRAMUSCULAR | Status: DC | PRN
  Administered 2020-03-17: 60 mg via INTRAVENOUS

## 2020-03-17 MED ORDER — APIXABAN 5 MG PO TABS: 5.0000 mg | ORAL_TABLET | Freq: Two times a day (BID) | ORAL | 1 refills | Status: DC

## 2020-03-17 MED ORDER — PROPOFOL 10 MG/ML IV BOLUS
INTRAVENOUS | Status: DC | PRN
  Administered 2020-03-17 (×2): 30 mg via INTRAVENOUS

## 2020-03-17 MED ORDER — LACTATED RINGERS IV SOLN
INTRAVENOUS | Status: DC
  Administered 2020-03-17: 1000 mL via INTRAVENOUS

## 2020-03-17 SURGICAL SUPPLY — 14 items

## 2020-03-17 NOTE — Anesthesia Postprocedure Evaluation (Signed)
Anesthesia Post Note  Patient: Paul Allen  Procedure(s) Performed: ESOPHAGOGASTRODUODENOSCOPY (EGD) WITH PROPOFOL (N/A ) STENT REMOVAL     Patient location during evaluation: PACU Anesthesia Type: MAC Level of consciousness: awake and alert Pain management: pain level controlled Vital Signs Assessment: post-procedure vital signs reviewed and stable Respiratory status: spontaneous breathing, nonlabored ventilation, respiratory function stable and patient connected to nasal cannula oxygen Cardiovascular status: stable and blood pressure returned to baseline Postop Assessment: no apparent nausea or vomiting Anesthetic complications: no    Last Vitals:  Vitals:   03/17/20 1310 03/17/20 1320  BP: (!) 128/91 128/77  Pulse: 66 62  Resp: 15 18  Temp:    SpO2: 99% 100%    Last Pain:  Vitals:   03/17/20 1320  TempSrc:   PainSc: 0-No pain                 Effie Berkshire

## 2020-03-17 NOTE — Anesthesia Preprocedure Evaluation (Signed)
Anesthesia Evaluation  Patient identified by MRN, date of birth, ID band Patient awake    Reviewed: Allergy & Precautions, NPO status , Patient's Chart, lab work & pertinent test results  Airway Mallampati: II  TM Distance: >3 FB Neck ROM: Full    Dental no notable dental hx. (+) Poor Dentition, Teeth Intact   Pulmonary former smoker,    Pulmonary exam normal        Cardiovascular negative cardio ROS Normal cardiovascular exam     Neuro/Psych negative neurological ROS     GI/Hepatic Neg liver ROS, GERD  Medicated,  Endo/Other  negative endocrine ROS  Renal/GU      Musculoskeletal negative musculoskeletal ROS (+)   Abdominal Normal abdominal exam  (+)   Peds  Hematology negative hematology ROS (+)   Anesthesia Other Findings   Reproductive/Obstetrics                             Anesthesia Physical Anesthesia Plan  ASA: II  Anesthesia Plan: MAC   Post-op Pain Management:    Induction: Intravenous  PONV Risk Score and Plan: 0 and Propofol infusion  Airway Management Planned: Natural Airway and Simple Face Mask  Additional Equipment: None  Intra-op Plan:   Post-operative Plan:   Informed Consent: I have reviewed the patients History and Physical, chart, labs and discussed the procedure including the risks, benefits and alternatives for the proposed anesthesia with the patient or authorized representative who has indicated his/her understanding and acceptance.       Plan Discussed with: CRNA  Anesthesia Plan Comments:         Anesthesia Quick Evaluation

## 2020-03-17 NOTE — Op Note (Addendum)
[facility] Patient Name: Paul Allen Procedure Date: 03/17/2020 MRN: [id number] Attending MD: Justice Britain , MD Date of Birth: 09-07-1981 CSN: [id number] Age: 39 Admit Type: Outpatient Procedure:                Upper GI endoscopy Indications:              Pancreatic stent removal Providers:                Justice Britain, MD, Carmie End, RN, Elspeth Cho Tech., Technician, Marla Roe, CRNA Referring MD:             Estill Cotta. Loletha Carrow, MD, Milus Banister, MD Medicines:                Monitored Anesthesia Care Complications:            No immediate complications. Estimated Blood Loss:     Estimated blood loss was minimal. Procedure:                Pre-Anesthesia Assessment:                           - Prior to the procedure, a History and Physical                            was performed, and patient medications and                            allergies were reviewed. The patient's tolerance of                            previous anesthesia was also reviewed. The risks                            and benefits of the procedure and the sedation                            options and risks were discussed with the patient.                            All questions were answered, and informed consent                            was obtained. Prior Anticoagulants: The patient has                            taken Eliquis (apixaban), last dose was 1 day prior                            to procedure. ASA Grade Assessment: III - A patient                            with severe systemic disease. After reviewing the  risks and benefits, the patient was deemed in                            satisfactory condition to undergo the procedure.                           After obtaining informed consent, the endoscope was                            passed under direct vision. Throughout the                             procedure, the patient's blood pressure, pulse, and                            oxygen saturations were monitored continuously. The                            [id number]-H190 ([id number]) Olympus gastroscope was                            introduced through the mouth, and advanced to the                            second part of duodenum. The [id number]-Q180V ([id number])                            Olympus was introduced through the mouth, and                            advanced to the second part of duodenum. The upper                            GI endoscopy was accomplished without difficulty.                            The patient tolerated the procedure. Scope In: Scope Out: Findings:      No gross lesions were noted in the entire esophagus.      The Z-line was irregular and was found 40 cm from the incisors.      A 2 cm hiatal hernia was present.      Patchy mildly erythematous mucosa without bleeding was found in the       cardia, in the gastric fundus and in the gastric body.      No other gross lesions were noted in the entire examined stomach.       Biopsies were taken with a cold forceps for histology and Helicobacter       pylori testing.      A previously placed plastic pancreatic stent was seen at the ampulla.       Stent removal was accomplished with a snare.      There was evidence of a patent Pancreatic Sphincterotomy at the ampulla.       This was characterized by healthy appearing mucosa otherwise.      No other gross lesions were  noted in the duodenal bulb, in the first       portion of the duodenum and in the second portion of the duodenum. Impression:               - No gross lesions in esophagus. Z-line irregular,                            40 cm from the incisors.                           - 2 cm hiatal hernia.                           - Erythematous mucosa in the cardia, gastric fundus                            and gastric body. No gross lesions in the stomach.                             Biopsied.                           - Plastic pancreatic stent at the ampulla. Removed.                           - Patent Pancreatic Sphincterotomy, characterized                            by healthy appearing mucosa was found at ampulla.                           - No gross lesions in the duodenal bulb, in the                            first portion of the duodenum and in the second                            portion of the duodenum. Moderate Sedation:      Not Applicable - Patient had care per Anesthesia. Recommendation:           - The patient will be observed post-procedure,                            until all discharge criteria are met.                           - Discharge patient to home.                           - Patient has a contact number available for                            emergencies. The signs and symptoms of potential  delayed complications were discussed with the                            patient. Return to normal activities tomorrow.                            Written discharge instructions were provided to the                            patient.                           - Low fat diet for 2 weeks.                           - May restart Eliquis tomorrow morning in effort of                            not having patient off blood thinner for too long                            in setting of known VTE. Will be at slightly                            increased risk of bleeding but overall the risks of                            being off of blood thinner are outweight by                            benefits of being on at this time.                           - Continue PPI at twice daily dosing (he feels                            improved on this so we will plan to maintain for                            now).                           - Continue present medications otherwise.                           - Will send prescription  for at least 1 more month                            of Eliquis (he was going to run out within the next                            week).                           -  Will discuss case with Dr. Loletha Carrow his primary GI                            to consider role of repeat CTAP with IV contrast in                            next 2-4 weeks. This will allow [place] to try and                            discern should patient have recurrence of fluid                            collections that could suggest a potential                            persistent leak that would require repeat ERCP with                            further pancreatic duct stenting (although the                            patient did not have overt extravasation during his                            initial ERCP) and will also help Dr. Loletha Carrow decide                            about continuation of Eliquis for his Portal Venous                            thrombus.                           - At some point in future consider an EUS if                            recurrent pancreatitis episodes are documented.                           - Follow up with Dr. Loletha Carrow as scheduled.                           - The findings and recommendations were discussed                            with the patient.                           - The findings and recommendations were discussed                            with the patient's family. Procedure Code(s):        --- Professional ---  66599, Esophagogastroduodenoscopy, flexible,                            transoral; with removal of foreign body(s)                           43239, Esophagogastroduodenoscopy, flexible,                            transoral; with biopsy, single or multiple Diagnosis Code(s):        --- Professional ---                           K22.8, Other specified diseases of esophagus                           K44.9, Diaphragmatic hernia without obstruction  or                            gangrene                           K31.89, Other diseases of stomach and duodenum                           Z46.59, Encounter for fitting and adjustment of                            other gastrointestinal appliance and device                           Z98.890, Other specified postprocedural states CPT copyright 2019 American Medical Association. All rights reserved. The codes documented in this report are preliminary and upon coder review may  be revised to meet current compliance requirements. Justice Britain, MD 03/17/2020 1:16:38 PM Number of Addenda: 0

## 2020-03-17 NOTE — H&P (Signed)
GASTROENTEROLOGY PROCEDURE H&P NOTE   Primary Care Physician: Kerin Perna, NP  HPI: Paul Allen is a 39 y.o. male who presents for EGD with attempt at PD stent removal from prior presumed pancreatic leak but inability to remove within last 6 weeks due to recurrent pancreatitis and significant duodenal inflammation.  Past Medical History:  Diagnosis Date  . Abdominal pain   . 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 Nausea & vomiting   . Pancreatitis    Past Surgical History:  Procedure Laterality Date  . APPENDECTOMY    . BIOPSY  01/29/2020   Procedure: BIOPSY;  Surgeon: Milus Banister, MD;  Location: [facility];  Service: Gastroenterology;;  . ERCP N/A 12/06/2019   Procedure: ENDOSCOPIC RETROGRADE CHOLANGIOPANCREATOGRAPHY (ERCP);  Surgeon: Milus Banister, MD;  Location: [facility];  Service: Endoscopy;  Laterality: N/A;  Aborted Procedure  . ERCP N/A 12/11/2019   Procedure: ENDOSCOPIC RETROGRADE CHOLANGIOPANCREATOGRAPHY (ERCP);  Surgeon: Irving Copas., MD;  Location: [facility];  Service: Gastroenterology;  Laterality: N/A;  . ESOPHAGOGASTRODUODENOSCOPY (EGD) WITH PROPOFOL N/A 01/29/2020   Procedure: ESOPHAGOGASTRODUODENOSCOPY (EGD) WITH PROPOFOL;  Surgeon: Milus Banister, MD;  Location: [facility];  Service: Gastroenterology;  Laterality: N/A;  . PANCREATIC STENT PLACEMENT  12/11/2019   Procedure: PANCREATIC STENT PLACEMENT;  Surgeon: Irving Copas., MD;  Location: [facility];  Service: Gastroenterology;;  . Joan Mayans  12/11/2019   Procedure: Joan Mayans;  Surgeon: Irving Copas., MD;  Location: [facility];  Service: Gastroenterology;;   No current facility-administered medications for this encounter.   Allergies  Allergen Reactions  . Penicillins Rash    Did it involve swelling of the face/tongue/throat, SOB, or low BP? No Did it involve sudden or severe rash/hives, skin  peeling, or any reaction on the inside of your mouth or nose? Yes Did you need to seek medical attention at a hospital or doctor's office? Yes When did it last happen?childhood allergy If all above answers are "NO", may proceed with cephalosporin use.    Family History  Problem Relation Age of Onset  . Cholecystitis Mother   . Arthritis Mother   . Stomach cancer Father    Social History   Socioeconomic History  . Marital status: Single    Spouse name: Not on file  . Number of children: Not on file  . Years of education: Not on file  . Highest education level: Not on file  Occupational History  . Not on file  Tobacco Use  . Smoking status: Current Every Day Smoker    Packs/day: 1.50    Types: Cigarettes  . Smokeless tobacco: Never Used  Substance and Sexual Activity  . Alcohol use: Not Currently    Comment: hasn't drank in 4 weeks, but before would drink about 5 malt beers a day  . Drug use: Yes    Frequency: 7.0 times per week    Types: Marijuana  . Sexual activity: Not on file  Other Topics Concern  . Not on file  Social History Narrative   Single no kids   Uninsured   Smoker - tobacco and marijuana   Alcohol - hx - stopped last time in 11/2019 w/ pancreatitis   Social Determinants of Health   Financial Resource Strain:   . Difficulty of Paying Living Expenses:   Food Insecurity:   . Worried About Charity fundraiser in the Last Year:   . Arboriculturist in  the Last Year:   Transportation Needs:   . Freight forwarder (Medical):   Marland Kitchen Lack of Transportation (Non-Medical):   Physical Activity:   . Days of Exercise per Week:   . Minutes of Exercise per Session:   Stress:   . Feeling of Stress :   Social Connections:   . Frequency of Communication with Friends and Family:   . Frequency of Social Gatherings with Friends and Family:   . Attends Religious Services:   . Active Member of Clubs or Organizations:   . Attends Banker Meetings:    Marland Kitchen Marital Status:   Intimate Partner Violence:   . Fear of Current or Ex-Partner:   . Emotionally Abused:   Marland Kitchen Physically Abused:   . Sexually Abused:     Physical Exam: Vital signs in last 24 hours:     GEN: NAD EYE: Sclerae anicteric ENT: MMM CV: Non-tachycardic GI: Soft, TTP in abdomen NEURO:  Alert & Oriented x 3  Lab Results: No results for input(s): WBC, HGB, HCT, PLT in the last 72 hours. BMET No results for input(s): NA, K, CL, CO2, GLUCOSE, BUN, CREATININE, CALCIUM in the last 72 hours. LFT No results for input(s): PROT, ALBUMIN, AST, ALT, ALKPHOS, BILITOT, BILIDIR, IBILI in the last 72 hours. PT/INR No results for input(s): LABPROT, INR in the last 72 hours.   Impression / Plan: This is a 39 y.o.male who presents for EGD with attempt at PD stent removal from prior presumed pancreatic leak but inability to remove within last 6 weeks due to recurrent pancreatitis and significant duodenal inflammation.  The risks and benefits of endoscopic evaluation were discussed with the patient; these include but are not limited to the risk of perforation, infection, bleeding, missed lesions, lack of diagnosis, severe illness requiring hospitalization, as well as anesthesia and sedation related illnesses.  The patient is agreeable to proceed.    Corliss Parish, MD [facility] Advanced [facility]oscopy Office # [phone]

## 2020-03-17 NOTE — Transfer of Care (Signed)
Immediate Anesthesia Transfer of Care Note  Patient: Paul Allen  Procedure(s) Performed: ESOPHAGOGASTRODUODENOSCOPY (EGD) WITH PROPOFOL (N/A ) STENT REMOVAL  Patient Location: PACU  Anesthesia Type:MAC  Level of Consciousness: awake, alert  and oriented  Airway & Oxygen Therapy: Patient Spontanous Breathing  Post-op Assessment: Report given to RN and Post -op Vital signs reviewed and stable  Post vital signs: Reviewed and stable  Last Vitals:  Vitals Value Taken Time  BP    Temp    Pulse 67 03/17/20 1306  Resp 15 03/17/20 1306  SpO2 100 % 03/17/20 1306  Vitals shown include unvalidated device data.  Last Pain:  Vitals:   03/17/20 1205  TempSrc: Oral  PainSc: 0-No pain         Complications: No apparent anesthesia complications

## 2020-03-17 NOTE — Anesthesia Procedure Notes (Signed)
Date/Time: 03/17/2020 12:41 PM Performed by: Jhonnie Garner, CRNA Oxygen Delivery Method: Simple face mask

## 2020-03-17 NOTE — Discharge Instructions (Signed)
Continue taking 40 mg patorprozole twice a day.  Resume apixaban tomorrow 03/18/2020 in the morning.     YOU HAD AN ENDOSCOPIC PROCEDURE TODAY: Refer to the procedure report and other information in the discharge instructions given to you for any specific questions about what was found during the examination. If this information does not answer your questions, please call [place] office at [phone] to clarify.   YOU SHOULD EXPECT: Some feelings of bloating in the abdomen. Passage of more gas than usual. Walking can help get rid of the air that was put into your GI tract during the procedure and reduce the bloating. If you had a lower endoscopy (such as a colonoscopy or flexible sigmoidoscopy) you may notice spotting of blood in your stool or on the toilet paper. Some abdominal soreness may be present for a day or two, also.  DIET: Your first meal following the procedure should be a light meal and then it is ok to progress to your normal diet. A half-sandwich or bowl of soup is an example of a good first meal. Heavy or fried foods are harder to digest and may make you feel nauseous or bloated. Drink plenty of fluids but you should avoid alcoholic beverages for 24 hours. If you had a esophageal dilation, please see attached instructions for diet.    ACTIVITY: Your care partner should take you home directly after the procedure. You should plan to take it easy, moving slowly for the rest of the day. You can resume normal activity the day after the procedure however YOU SHOULD NOT DRIVE, use power tools, machinery or perform tasks that involve climbing or major physical exertion for 24 hours (because of the sedation medicines used during the test).   SYMPTOMS TO REPORT IMMEDIATELY: A gastroenterologist can be reached at any hour. Please call [phone]  for any of the following symptoms:   Following upper endoscopy (EGD, EUS, ERCP, esophageal dilation) Vomiting of blood or coffee ground material    New, significant abdominal pain  New, significant chest pain or pain under the shoulder blades  Painful or persistently difficult swallowing  New shortness of breath  Black, tarry-looking or red, bloody stools  FOLLOW UP:  If any biopsies were taken you will be contacted by phone or by letter within the next 1-3 weeks. Call [phone]  if you have not heard about the biopsies in 3 weeks.  Please also call with any specific questions about appointments or follow up tests.

## 2020-03-18 ENCOUNTER — Encounter: Payer: Self-pay | Admitting: *Deleted

## 2020-03-18 ENCOUNTER — Other Ambulatory Visit: Payer: Self-pay

## 2020-03-18 LAB — SURGICAL PATHOLOGY

## 2020-03-19 ENCOUNTER — Ambulatory Visit: Payer: Self-pay | Admitting: Gastroenterology

## 2020-03-21 ENCOUNTER — Encounter: Payer: Self-pay | Admitting: Gastroenterology

## 2020-03-22 NOTE — Progress Notes (Signed)
All great news.  I have messaged my nurse to arrange CTAP in 2 weeks  Thanks again.  - HD

## 2020-03-23 ENCOUNTER — Telehealth: Payer: Self-pay

## 2020-03-23 ENCOUNTER — Telehealth: Payer: Self-pay | Admitting: Gastroenterology

## 2020-03-23 ENCOUNTER — Other Ambulatory Visit: Payer: Self-pay

## 2020-03-23 ENCOUNTER — Telehealth (INDEPENDENT_AMBULATORY_CARE_PROVIDER_SITE_OTHER): Payer: Self-pay

## 2020-03-23 DIAGNOSIS — K859 Acute pancreatitis without necrosis or infection, unspecified: Secondary | ICD-10-CM

## 2020-03-23 MED ORDER — APIXABAN 5 MG PO TABS: 5.0000 mg | ORAL_TABLET | Freq: Two times a day (BID) | ORAL | 0 refills | Status: DC

## 2020-03-23 MED ORDER — APIXABAN 5 MG PO TABS: 5.0000 mg | ORAL_TABLET | Freq: Two times a day (BID) | ORAL | 1 refills | Status: DC

## 2020-03-23 NOTE — Telephone Encounter (Signed)
Pt scheduled at [facility] for CT of A/P 4/20@10 :30am. Pt to pick up contrast prior to scan. Pt to be NPO after 6:30am except he needs to drink bottle 1 of contrast at 8:30am and bottle 2 of contrast at 9:30am. Left message for pt to call back.

## 2020-03-23 NOTE — Telephone Encounter (Signed)
Eliquis is not a medication that is prescribed by Dr.Mansouraty- pt needs to contact prescribing MD for refills of Eliquis.

## 2020-03-23 NOTE — Telephone Encounter (Signed)
Sent to PCP. Please fill. Patient has also contacted congregational nurse at [facility] about refill.

## 2020-03-23 NOTE — Telephone Encounter (Signed)
Call received from patient stating that he needs a refill of eliquis. He already contacted [facility] as well as [facility].   He is concerned about missing any doses.   Message received from Paul Passe, NP noting that she will need to check with GI prior to refilling.   Call placed to patient later this afternoon. He said he has been contacted by Paul Allen and Paul Randa Evens, NP and the issue has been resolved. The order for eliquis was being sent to his [facility].

## 2020-03-23 NOTE — Telephone Encounter (Signed)
Patient called to make a medication refill for   apixaban (ELIQUIS) 5 MG TABS tablet   Patient uses   [id number] - [place], [place] - [address]  [id number],  Patient is completely out.  Please advice [phone]

## 2020-03-23 NOTE — Telephone Encounter (Signed)
-----   Message from Charlie Pitter III, MD sent at 03/22/2020 10:27 PM EDT ----- Please arrange CT scan abdomen and pelvis the week of April 18th to re-evaluate pancreatitis and SMV thrombosis.  When you speak with this patient, in case he did not get any further diet instructions after the EGD with Dr. Meridee Score, he can resume a regular diet.  The gastric outlet obstruction has resolved.

## 2020-03-23 NOTE — Telephone Encounter (Signed)
Spoke with patient today. I do not normally prescribe anticoagulation but for this particular case, I am going to give a 1 month refill of Eliquis. He had not received this previously from [facility] but he now has insurance. I called and spoke with [facility]. I sent a Rx through the computer but it did not go through. I spoke with Pharmacy staff and have done a verbal Rx. Patient was made aware that we would work on this today. Any additional prescriptions to be done by his PCP or Primary GI should he continue to need [facility] after his upcoming CT scan.   Corliss Parish, MD [facility] Office # [phone]

## 2020-03-24 ENCOUNTER — Telehealth: Payer: Self-pay

## 2020-03-24 NOTE — Telephone Encounter (Signed)
-----   Message from Lemar Lofty., MD sent at 03/23/2020  8:24 PM EDT ----- Regarding: RE: Tyrone Nine, Thank you for note. Amberleigh Gerken, please call office tomorrow and let them know as the procedure note was that a CT scan is being ordered for followup and that will determine need to continue the anticoagulation or not.  Dr. Myrtie Neither is arranging this for approximately 2 weeks after his procedure. He will continue his blood thinner until that CT scan has been reviewed. Thank you for calling them tomorrow. GM ----- Message ----- From: Napoleon Form, MD Sent: 03/23/2020   7:38 PM EDT To: Lemar Lofty., MD  PMD's office [facility] called to discuss regarding your recommendation in regards to anticoagulation, she wasn't clear on your plan for how long to keep him on it. please give them a call tomorrow.   Thanks VN

## 2020-03-24 NOTE — Telephone Encounter (Signed)
Left message on machine to call back  

## 2020-03-24 NOTE — Telephone Encounter (Signed)
Thank you for attending to that while I was out of the office yesterday. That was the plan per the recent post-EGD follow up messages.  He will be on Eliquis at least until the upcoming CT scan.    Please direct any further inquiries about the Eliquis to me.

## 2020-03-25 NOTE — Telephone Encounter (Signed)
I have left several messages for the office to return call with no response.  Will await further communication from the office.

## 2020-03-25 NOTE — Telephone Encounter (Signed)
Called and left message for nurse to return call.  

## 2020-03-30 NOTE — Telephone Encounter (Signed)
Left message on machine to call back  

## 2020-04-02 NOTE — Telephone Encounter (Signed)
Unable to reach pt by phone. Information regarding CT scan mailed to pt.

## 2020-04-07 ENCOUNTER — Other Ambulatory Visit: Payer: Self-pay

## 2020-04-07 ENCOUNTER — Ambulatory Visit (HOSPITAL_COMMUNITY)
Admission: RE | Admit: 2020-04-07 | Discharge: 2020-04-07 | Disposition: A | Discharge status: Home / Self Care | Payer: 59 | Source: Ambulatory Visit | Attending: Gastroenterology | Admitting: Gastroenterology

## 2020-04-07 ENCOUNTER — Encounter (HOSPITAL_COMMUNITY): Payer: Self-pay

## 2020-04-07 DIAGNOSIS — K863 Pseudocyst of pancreas: Secondary | ICD-10-CM

## 2020-04-07 DIAGNOSIS — K859 Acute pancreatitis without necrosis or infection, unspecified: Secondary | ICD-10-CM | POA: Insufficient documentation

## 2020-04-07 LAB — POCT I-STAT CREATININE: Creatinine, Ser: 0.6 mg/dL — ABNORMAL LOW (ref 0.61–1.24)

## 2020-04-07 MED ORDER — SODIUM CHLORIDE (PF) 0.9 % IJ SOLN
INTRAMUSCULAR | Status: AC
  Filled 2020-04-07: qty 50

## 2020-04-07 MED ORDER — IOHEXOL 300 MG/ML  SOLN
100.0000 mL | Freq: Once | INTRAMUSCULAR | Status: AC | PRN
  Administered 2020-04-07: 100 mL via INTRAVENOUS

## 2020-04-09 ENCOUNTER — Emergency Department (HOSPITAL_COMMUNITY): Payer: 59

## 2020-04-09 ENCOUNTER — Encounter (HOSPITAL_COMMUNITY): Payer: Self-pay

## 2020-04-09 ENCOUNTER — Observation Stay (HOSPITAL_COMMUNITY)
Admission: EM | Admit: 2020-04-09 | Discharge: 2020-04-10 | Disposition: A | Discharge status: Home / Self Care | Payer: 59 | Attending: Internal Medicine | Admitting: Internal Medicine

## 2020-04-09 ENCOUNTER — Other Ambulatory Visit: Payer: Self-pay

## 2020-04-09 DIAGNOSIS — K2900 Acute gastritis without bleeding: Secondary | ICD-10-CM | POA: Diagnosis present

## 2020-04-09 DIAGNOSIS — R1012 Left upper quadrant pain: Principal | ICD-10-CM | POA: Insufficient documentation

## 2020-04-09 DIAGNOSIS — Z7901 Long term (current) use of anticoagulants: Secondary | ICD-10-CM | POA: Insufficient documentation

## 2020-04-09 DIAGNOSIS — I81 Portal vein thrombosis: Secondary | ICD-10-CM | POA: Diagnosis not present

## 2020-04-09 DIAGNOSIS — Z20822 Contact with and (suspected) exposure to covid-19: Secondary | ICD-10-CM | POA: Insufficient documentation

## 2020-04-09 DIAGNOSIS — I700 Atherosclerosis of aorta: Secondary | ICD-10-CM | POA: Insufficient documentation

## 2020-04-09 DIAGNOSIS — Z8719 Personal history of other diseases of the digestive system: Secondary | ICD-10-CM | POA: Insufficient documentation

## 2020-04-09 DIAGNOSIS — Z8711 Personal history of peptic ulcer disease: Secondary | ICD-10-CM | POA: Diagnosis not present

## 2020-04-09 DIAGNOSIS — R109 Unspecified abdominal pain: Secondary | ICD-10-CM | POA: Diagnosis present

## 2020-04-09 DIAGNOSIS — R1010 Upper abdominal pain, unspecified: Secondary | ICD-10-CM | POA: Diagnosis not present

## 2020-04-09 DIAGNOSIS — Z79899 Other long term (current) drug therapy: Secondary | ICD-10-CM | POA: Insufficient documentation

## 2020-04-09 DIAGNOSIS — Z880 Allergy status to penicillin: Secondary | ICD-10-CM | POA: Insufficient documentation

## 2020-04-09 LAB — COMPREHENSIVE METABOLIC PANEL WITH GFR
ALT: 45 U/L — ABNORMAL HIGH (ref 0–44)
AST: 38 U/L (ref 15–41)
Albumin: 4.8 g/dL (ref 3.5–5.0)
Alkaline Phosphatase: 247 U/L — ABNORMAL HIGH (ref 38–126)
Anion gap: 15 (ref 5–15)
BUN: 16 mg/dL (ref 6–20)
CO2: 25 mmol/L (ref 22–32)
Calcium: 9.8 mg/dL (ref 8.9–10.3)
Chloride: 100 mmol/L (ref 98–111)
Creatinine, Ser: 0.63 mg/dL (ref 0.61–1.24)
GFR calc Af Amer: >60 mL/min
GFR calc non Af Amer: >60 mL/min
Glucose, Bld: 121 mg/dL — ABNORMAL HIGH (ref 70–99)
Potassium: 3.3 mmol/L — ABNORMAL LOW (ref 3.5–5.1)
Sodium: 140 mmol/L (ref 135–145)
Total Bilirubin: 1.1 mg/dL (ref 0.3–1.2)
Total Protein: 8.4 g/dL — ABNORMAL HIGH (ref 6.5–8.1)

## 2020-04-09 LAB — URINALYSIS, ROUTINE W REFLEX MICROSCOPIC
Bilirubin Urine: NEGATIVE
Glucose, UA: NEGATIVE mg/dL
Hgb urine dipstick: NEGATIVE
Ketones, ur: 5 mg/dL — AB
Leukocytes,Ua: NEGATIVE
Nitrite: NEGATIVE
Protein, ur: 30 mg/dL — AB
Specific Gravity, Urine: 1.03 (ref 1.005–1.030)
pH: 5 (ref 5.0–8.0)

## 2020-04-09 LAB — RESPIRATORY PANEL BY RT PCR (FLU A&B, COVID)
Influenza A by PCR: NEGATIVE
Influenza B by PCR: NEGATIVE
SARS Coronavirus 2 by RT PCR: NEGATIVE

## 2020-04-09 LAB — CBC
HCT: 42.4 % (ref 39.0–52.0)
Hemoglobin: 14.2 g/dL (ref 13.0–17.0)
MCH: 32.1 pg (ref 26.0–34.0)
MCHC: 33.5 g/dL (ref 30.0–36.0)
MCV: 95.7 fL (ref 80.0–100.0)
Platelets: 198 10*3/uL (ref 150–400)
RBC: 4.43 MIL/uL (ref 4.22–5.81)
RDW: 14.8 % (ref 11.5–15.5)
WBC: 8.7 10*3/uL (ref 4.0–10.5)
nRBC: 0 % (ref 0.0–0.2)

## 2020-04-09 LAB — APTT
aPTT: 32 seconds (ref 24–36)
aPTT: 68 seconds — ABNORMAL HIGH (ref 24–36)

## 2020-04-09 LAB — PROTIME-INR
INR: 1.1 (ref 0.8–1.2)
Prothrombin Time: 14 seconds (ref 11.4–15.2)

## 2020-04-09 LAB — LIPASE, BLOOD: Lipase: 50 U/L (ref 11–51)

## 2020-04-09 LAB — HEPARIN LEVEL (UNFRACTIONATED): Heparin Unfractionated: 0.64 IU/mL (ref 0.30–0.70)

## 2020-04-09 LAB — LACTIC ACID, PLASMA: Lactic Acid, Venous: 1.3 mmol/L (ref 0.5–1.9)

## 2020-04-09 MED ORDER — ONDANSETRON HCL 4 MG/2ML IJ SOLN
4.0000 mg | Freq: Once | INTRAMUSCULAR | Status: AC
  Administered 2020-04-09: 05:00:00 4 mg via INTRAVENOUS
  Filled 2020-04-09: qty 2

## 2020-04-09 MED ORDER — SUCRALFATE 1 GM/10ML PO SUSP
1.0000 g | Freq: Four times a day (QID) | ORAL | Status: DC
  Administered 2020-04-09 – 2020-04-10 (×3): 1 g via ORAL
  Filled 2020-04-09 (×3): qty 10

## 2020-04-09 MED ORDER — SODIUM CHLORIDE 0.9 % IV SOLN
INTRAVENOUS | Status: DC
  Administered 2020-04-09 (×2): 150 mL/h via INTRAVENOUS

## 2020-04-09 MED ORDER — HYDROMORPHONE HCL 1 MG/ML IJ SOLN
1.0000 mg | Freq: Once | INTRAMUSCULAR | Status: AC
  Administered 2020-04-09: 1 mg via INTRAVENOUS
  Filled 2020-04-09: qty 1

## 2020-04-09 MED ORDER — IOHEXOL 300 MG/ML  SOLN
100.0000 mL | Freq: Once | INTRAMUSCULAR | Status: AC | PRN
  Administered 2020-04-09: 06:00:00 100 mL via INTRAVENOUS

## 2020-04-09 MED ORDER — HEPARIN (PORCINE) 25000 UT/250ML-% IV SOLN
1200.0000 [IU]/h | INTRAVENOUS | Status: DC
  Administered 2020-04-09 – 2020-04-10 (×2): 1200 [IU]/h via INTRAVENOUS
  Filled 2020-04-09 (×2): qty 250

## 2020-04-09 MED ORDER — PANTOPRAZOLE SODIUM 40 MG IV SOLR
40.0000 mg | Freq: Two times a day (BID) | INTRAVENOUS | Status: DC
  Administered 2020-04-09 – 2020-04-10 (×2): 40 mg via INTRAVENOUS
  Filled 2020-04-09 (×2): qty 40

## 2020-04-09 MED ORDER — HYDROMORPHONE HCL 1 MG/ML IJ SOLN
0.5000 mg | INTRAMUSCULAR | Status: DC | PRN
  Administered 2020-04-09 – 2020-04-10 (×5): 1 mg via INTRAVENOUS
  Filled 2020-04-09 (×5): qty 1

## 2020-04-09 MED ORDER — ONDANSETRON HCL 4 MG PO TABS
4.0000 mg | ORAL_TABLET | Freq: Four times a day (QID) | ORAL | Status: DC | PRN
  Administered 2020-04-10: 4 mg via ORAL
  Filled 2020-04-09: qty 1

## 2020-04-09 MED ORDER — ACETAMINOPHEN 650 MG RE SUPP: 650.0000 mg | Freq: Four times a day (QID) | RECTAL | Status: DC | PRN

## 2020-04-09 MED ORDER — SODIUM CHLORIDE 0.9% FLUSH: 3.0000 mL | Freq: Once | INTRAVENOUS | Status: DC

## 2020-04-09 MED ORDER — SODIUM CHLORIDE (PF) 0.9 % IJ SOLN
INTRAMUSCULAR | Status: AC
  Filled 2020-04-09: qty 50

## 2020-04-09 MED ORDER — ACETAMINOPHEN 325 MG PO TABS: 650.0000 mg | ORAL_TABLET | Freq: Four times a day (QID) | ORAL | Status: DC | PRN

## 2020-04-09 MED ORDER — SODIUM CHLORIDE 0.9 % IV BOLUS
1000.0000 mL | Freq: Once | INTRAVENOUS | Status: AC
  Administered 2020-04-09: 05:00:00 1000 mL via INTRAVENOUS

## 2020-04-09 MED ORDER — ONDANSETRON HCL 4 MG/2ML IJ SOLN
4.0000 mg | Freq: Four times a day (QID) | INTRAMUSCULAR | Status: DC | PRN
  Administered 2020-04-09 – 2020-04-10 (×3): 4 mg via INTRAVENOUS
  Filled 2020-04-09 (×3): qty 2

## 2020-04-09 MED ORDER — PANTOPRAZOLE SODIUM 40 MG IV SOLR
40.0000 mg | Freq: Once | INTRAVENOUS | Status: AC
  Administered 2020-04-09: 40 mg via INTRAVENOUS
  Filled 2020-04-09: qty 40

## 2020-04-09 NOTE — ED Provider Notes (Signed)
[facility]EMERGENCY DEPT [facility] Emergency Department Provider Note MRN:  [id number]  Arrival date & time: 04/09/20     Chief Complaint   Abdominal Pain   History of Present Illness   Paul Allen is a 39 y.o. year-old male with a history of mesenteric vein thrombosis, pancreatitis presenting to the ED with chief complaint of abdominal pain.  Worsening abdominal pain since Sunday, which began very mildly and progressed to pain 2 days ago.  Began vomiting this evening.  Pain became severe this evening.  Pain is diffuse in the abdomen, feels distended.  Normal bowel movement at 8 PM but none since.  Pain is moderate to severe, constant.  No diarrhea, no chest pain or shortness of breath.  Review of Systems  A complete 10 system review of systems was obtained and all systems are negative except as noted in the HPI and PMH.   Patient's Health History    Past Medical History:  Diagnosis Date  . Abdominal pain   . Mesenteric vein thrombosis (HCC)    Dec 2020 in setting of pancreatitis  . Nausea & vomiting   . Pancreatitis     Past Surgical History:  Procedure Laterality Date  . APPENDECTOMY    . BIOPSY  01/29/2020   Procedure: BIOPSY;  Surgeon: Rachael Fee, MD;  Location: [facility];  Service: Gastroenterology;;  . BIOPSY  03/17/2020   Procedure: BIOPSY;  Surgeon: Lemar Lofty., MD;  Location: [facility];  Service: Gastroenterology;;  . ERCP N/A 12/06/2019   Procedure: ENDOSCOPIC RETROGRADE CHOLANGIOPANCREATOGRAPHY (ERCP);  Surgeon: Rachael Fee, MD;  Location: [facility];  Service: Endoscopy;  Laterality: N/A;  Aborted Procedure  . ERCP N/A 12/11/2019   Procedure: ENDOSCOPIC RETROGRADE CHOLANGIOPANCREATOGRAPHY (ERCP);  Surgeon: Lemar Lofty., MD;  Location: [facility];  Service: Gastroenterology;  Laterality: N/A;  . ESOPHAGOGASTRODUODENOSCOPY (EGD) WITH PROPOFOL N/A 01/29/2020   Procedure: ESOPHAGOGASTRODUODENOSCOPY (EGD) WITH PROPOFOL;   Surgeon: Rachael Fee, MD;  Location: [facility];  Service: Gastroenterology;  Laterality: N/A;  . ESOPHAGOGASTRODUODENOSCOPY (EGD) WITH PROPOFOL N/A 03/17/2020   Procedure: ESOPHAGOGASTRODUODENOSCOPY (EGD) WITH PROPOFOL;  Surgeon: Meridee Score Netty Starring., MD;  Location: [facility];  Service: Gastroenterology;  Laterality: N/A;  . PANCREATIC STENT PLACEMENT  12/11/2019   Procedure: PANCREATIC STENT PLACEMENT;  Surgeon: Meridee Score Netty Starring., MD;  Location: [facility];  Service: Gastroenterology;;  . Dennison Mascot  12/11/2019   Procedure: Dennison Mascot;  Surgeon: Lemar Lofty., MD;  Location: [facility];  Service: Gastroenterology;;  . Francine Graven REMOVAL  03/17/2020   Procedure: STENT REMOVAL;  Surgeon: Lemar Lofty., MD;  Location: [facility];  Service: Gastroenterology;;    Family History  Problem Relation Age of Onset  . Cholecystitis Mother   . Arthritis Mother   . Stomach cancer Father     Social History   Socioeconomic History  . Marital status: Single    Spouse name: Not on file  . Number of children: Not on file  . Years of education: Not on file  . Highest education level: Not on file  Occupational History  . Not on file  Tobacco Use  . Smoking status: Former Smoker    Packs/day: 1.50    Types: Cigarettes    Quit date: 11/19/2019    Years since quitting: 0.3  . Smokeless tobacco: Never Used  Substance and Sexual Activity  . Alcohol use: Not Currently    Comment: quit six months ago  . Drug use: Yes    Frequency: 7.0 times per  week    Types: Marijuana  . Sexual activity: Not on file  Other Topics Concern  . Not on file  Social History Narrative   Single no kids   Uninsured   Smoker - tobacco and marijuana   Alcohol - hx - stopped last time in 11/2019 w/ pancreatitis   Social Determinants of Health   Financial Resource Strain:   . Difficulty of Paying Living Expenses:   Food Insecurity:   . Worried About Programme researcher, broadcasting/film/video in  the Last Year:   . Barista in the Last Year:   Transportation Needs:   . Freight forwarder (Medical):   Marland Kitchen Lack of Transportation (Non-Medical):   Physical Activity:   . Days of Exercise per Week:   . Minutes of Exercise per Session:   Stress:   . Feeling of Stress :   Social Connections:   . Frequency of Communication with Friends and Family:   . Frequency of Social Gatherings with Friends and Family:   . Attends Religious Services:   . Active Member of Clubs or Organizations:   . Attends Banker Meetings:   Marland Kitchen Marital Status:   Intimate Partner Violence:   . Fear of Current or Ex-Partner:   . Emotionally Abused:   Marland Kitchen Physically Abused:   . Sexually Abused:      Physical Exam   Vitals:   04/09/20 0346 04/09/20 0530  BP: (!) 153/111 127/79  Pulse: 75 60  Resp: 16 12  Temp: 98.1 F (36.7 C)   SpO2: 100% 97%    CONSTITUTIONAL: Well-appearing, NAD NEURO:  Alert and oriented x 3, no focal deficits EYES:  eyes equal and reactive ENT/NECK:  no LAD, no JVD CARDIO: Regular rate, well-perfused, normal S1 and S2 PULM:  CTAB no wheezing or rhonchi GI/GU:  normal bowel sounds, mildly distended, moderately tender diffusely MSK/SPINE:  No gross deformities, no edema SKIN:  no rash, atraumatic PSYCH:  Appropriate speech and behavior  *Additional and/or pertinent findings included in MDM below  Diagnostic and Interventional Summary    EKG Interpretation  Date/Time:  04/09/2020 at 04: 22: 26 Ventricular Rate:  63 PR Interval:  105 QRS Duration: 92 QT Interval:  443 QTC Calculation: 454 R Axis:     Text Interpretation: Sinus rhythm, no significant change from prior      Labs Reviewed  COMPREHENSIVE METABOLIC PANEL - Abnormal; Notable for the following components:      Result Value   Potassium 3.3 (*)    Glucose, Bld 121 (*)    Total Protein 8.4 (*)    ALT 45 (*)    Alkaline Phosphatase 247 (*)    All other components within normal limits    URINALYSIS, ROUTINE W REFLEX MICROSCOPIC - Abnormal; Notable for the following components:   Color, Urine AMBER (*)    Ketones, ur 5 (*)    Protein, ur 30 (*)    Bacteria, UA RARE (*)    All other components within normal limits  RESPIRATORY PANEL BY RT PCR (FLU A&B, COVID)  LIPASE, BLOOD  CBC  LACTIC ACID, PLASMA    CT ABDOMEN PELVIS W CONTRAST  Final Result    DG ABD ACUTE 2+V W 1V CHEST  Final Result      Medications  sodium chloride flush (NS) 0.9 % injection 3 mL (3 mLs Intravenous Not Given 04/09/20 0444)  sodium chloride (PF) 0.9 % injection (has no administration in time range)  HYDROmorphone (DILAUDID)  injection 1 mg (1 mg Intravenous Given 04/09/20 0444)  ondansetron (ZOFRAN) injection 4 mg (4 mg Intravenous Given 04/09/20 0444)  sodium chloride 0.9 % bolus 1,000 mL (1,000 mLs Intravenous New Bag/Given 04/09/20 0444)  iohexol (OMNIPAQUE) 300 MG/ML solution 100 mL (100 mLs Intravenous Contrast Given 04/09/20 0605)  pantoprazole (PROTONIX) injection 40 mg (40 mg Intravenous Given 04/09/20 1194)     Procedures  /  Critical Care Procedures  ED Course and Medical Decision Making  I have reviewed the triage vital signs, the nursing notes, and pertinent available records from the EMR.  Listed above are laboratory and imaging tests that I personally ordered, reviewed, and interpreted and then considered in my medical decision making (see below for details).      Complicated abdominal history, CT of the abdomen performed as an outpatient yesterday in response to a remove pancreatic stent.  Acute pain since this evening, considering pancreatitis, bowel obstruction.  Hemodynamically stable, providing symptomatic management, will obtain plain film.  Plain film is nonspecific.  Patient continues to have significant pain and distention.  Pros and cons of repeat CT imaging were discussed and we moved forward with a repeat CT given the concern for an acute process that started just this  early morning.  CT revealing severe inflammatory process of the antrum of the stomach.  Upon chart review, it appears the patient has had recurrent pancreatic leaks causing inflammatory changes of the duodenum.  The concern is that this inflammatory changes a recurrent pancreatic leak given that patient's stent was recently removed.  These findings and concerns discussed with lobe our GI, who recommends admission for further testing.  Will admit to hospitalist service.  Barth Kirks. Sedonia Small, [place] [email or web address]  Final Clinical Impressions(s) / ED Diagnoses     ICD-10-CM   1. Acute gastritis without hemorrhage, unspecified gastritis type  K29.00   2. Abdominal pain  R10.9 DG ABD ACUTE 2+V W 1V CHEST    DG ABD ACUTE 2+V W 1V CHEST    ED Discharge Orders    None       Discharge Instructions Discussed with and Provided to Patient:   Discharge Instructions   None       Maudie Flakes, MD 04/09/20 [phone]

## 2020-04-09 NOTE — Consult Note (Addendum)
Referring Provider: No ref. provider found Primary Care Physician:  Grayce Sessions, NP Primary Gastroenterologist:  Dr. Myrtie Neither  Reason for Consultation:  Abnormal CT scan, nausea, vomiting, abdominal pain  HPI: Paul Allen is a 39 y.o. male with medical history significant of portal vein thrombosis/superior mesenteric vein thrombosis on Eliquis, recurrent pancreatitis with presumed pancreatic leak causing significant duodenal inflammation requiring PD stent which was removed on 03/17/2020 who presented to [facility] ED with progressively worsening abdominal pain over the past couple of days.  He describes discomfort in LUQ each morning that completely resolves after he eats breakfast.  He says that overall recently he was feeling great.  This usual LUQ abdominal pain worsened and was assocatied with nausea and vomiting so he came to the ED.  He had an outpatient CT of the abdomen performed for follow-up evaluation of his conditions on 4/20.   Here repeat CT scan of the abdomen and pelvis was performed and showed the following:  IMPRESSION: 1. Severe inflammatory process involving the antropyloric region of the stomach likely severe gastritis or peptic ulcer disease without a discrete ulcer. 2. Largely resolved pancreatic inflammation. 3. Stable chronic thrombosis/occlusion of the middle and left portal veins with associated cavernous transformation. 4. Stable age advanced atherosclerotic calcifications involving the distal aorta and iliac arteries.  Aortic Atherosclerosis (ICD10-I70.0).  This was compared to CT scan 2 days ago that stated the following:  Redemonstrated thickening of the gastric antrum and pylorus, which may be reactive to adjacent inflammation or indicative of infectious or inflammatory gastritis.  Had EGD with Dr. Meridee Score on 03/17/20 for PD stent removal and at that time he noted erythema throughout the stomach. Biopsies showed chronic gastritis, negative for  Hpylori.  He was started on pantoprazole 40 mg IV BID.  Is already feeling better this AM, but still quite tender in epigastrium/LUQ.   Past Medical History:  Diagnosis Date  . Abdominal pain   . Mesenteric vein thrombosis (HCC)    Dec 2020 in setting of pancreatitis  . Nausea & vomiting   . Pancreatitis     Past Surgical History:  Procedure Laterality Date  . APPENDECTOMY    . BIOPSY  01/29/2020   Procedure: BIOPSY;  Surgeon: Rachael Fee, MD;  Location: [facility];  Service: Gastroenterology;;  . BIOPSY  03/17/2020   Procedure: BIOPSY;  Surgeon: Lemar Lofty., MD;  Location: [facility];  Service: Gastroenterology;;  . ERCP N/A 12/06/2019   Procedure: ENDOSCOPIC RETROGRADE CHOLANGIOPANCREATOGRAPHY (ERCP);  Surgeon: Rachael Fee, MD;  Location: [facility];  Service: Endoscopy;  Laterality: N/A;  Aborted Procedure  . ERCP N/A 12/11/2019   Procedure: ENDOSCOPIC RETROGRADE CHOLANGIOPANCREATOGRAPHY (ERCP);  Surgeon: Lemar Lofty., MD;  Location: [facility];  Service: Gastroenterology;  Laterality: N/A;  . ESOPHAGOGASTRODUODENOSCOPY (EGD) WITH PROPOFOL N/A 01/29/2020   Procedure: ESOPHAGOGASTRODUODENOSCOPY (EGD) WITH PROPOFOL;  Surgeon: Rachael Fee, MD;  Location: [facility];  Service: Gastroenterology;  Laterality: N/A;  . ESOPHAGOGASTRODUODENOSCOPY (EGD) WITH PROPOFOL N/A 03/17/2020   Procedure: ESOPHAGOGASTRODUODENOSCOPY (EGD) WITH PROPOFOL;  Surgeon: Meridee Score Netty Starring., MD;  Location: [facility];  Service: Gastroenterology;  Laterality: N/A;  . PANCREATIC STENT PLACEMENT  12/11/2019   Procedure: PANCREATIC STENT PLACEMENT;  Surgeon: Lemar Lofty., MD;  Location: [facility];  Service: Gastroenterology;;  . Dennison Mascot  12/11/2019   Procedure: Dennison Mascot;  Surgeon: Lemar Lofty., MD;  Location: [facility];  Service: Gastroenterology;;  . Francine Graven REMOVAL  03/17/2020   Procedure: STENT REMOVAL;  Surgeon:  Mansouraty, Telford Nab., MD;  Location: [facility];  Service: Gastroenterology;;    Prior to Admission medications   Medication Sig Start Date End Date Taking? Authorizing Provider  acetaminophen (TYLENOL) 500 MG tablet Take 1,000 mg by mouth every 6 (six) hours as needed for moderate pain or headache.   Yes [provider]  apixaban (ELIQUIS) 5 MG TABS tablet Take 1 tablet (5 mg total) by mouth 2 (two) times daily. 03/23/20  Yes Mansouraty, Telford Nab., MD  folic acid (FOLVITE) 1 MG tablet Take 1 tablet (1 mg total) by mouth daily. 12/15/19  Yes Danford, Suann Larry, MD  pantoprazole (PROTONIX) 40 MG tablet Take 1 tablet (40 mg total) by mouth 2 (two) times daily. 03/17/20 04/16/20 Yes Mansouraty, Telford Nab., MD  thiamine 100 MG tablet Take 1 tablet (100 mg total) by mouth daily. 12/15/19  Yes Danford, Suann Larry, MD    Current Facility-Administered Medications  Medication Dose Route Frequency Provider Last Rate Last Admin  . 0.9 %  sodium chloride infusion   Intravenous Continuous Alekh, Kshitiz, MD      . acetaminophen (TYLENOL) tablet 650 mg  650 mg Oral Q6H PRN Aline August, MD       Or  . acetaminophen (TYLENOL) suppository 650 mg  650 mg Rectal Q6H PRN Alekh, Kshitiz, MD      . HYDROmorphone (DILAUDID) injection 0.5-1 mg  0.5-1 mg Intravenous Q2H PRN Aline August, MD   1 mg at 04/09/20 0910  . ondansetron (ZOFRAN) tablet 4 mg  4 mg Oral Q6H PRN Aline August, MD       Or  . ondansetron (ZOFRAN) injection 4 mg  4 mg Intravenous Q6H PRN Aline August, MD   4 mg at 04/09/20 0910  . pantoprazole (PROTONIX) injection 40 mg  40 mg Intravenous Q12H Alekh, Kshitiz, MD      . sodium chloride (PF) 0.9 % injection           . sodium chloride flush (NS) 0.9 % injection 3 mL  3 mL Intravenous Once Maudie Flakes, MD        Allergies as of 04/09/2020 - Review Complete 04/09/2020  Allergen Reaction Noted  . Penicillins Rash 05/13/2018    Family History  Problem Relation  Age of Onset  . Cholecystitis Mother   . Arthritis Mother   . Stomach cancer Father     Social History   Socioeconomic History  . Marital status: Single    Spouse name: Not on file  . Number of children: Not on file  . Years of education: Not on file  . Highest education level: Not on file  Occupational History  . Not on file  Tobacco Use  . Smoking status: Former Smoker    Packs/day: 1.50    Types: Cigarettes    Quit date: 11/19/2019    Years since quitting: 0.3  . Smokeless tobacco: Never Used  Substance and Sexual Activity  . Alcohol use: Not Currently    Comment: quit six months ago  . Drug use: Yes    Frequency: 7.0 times per week    Types: Marijuana  . Sexual activity: Not on file  Other Topics Concern  . Not on file  Social History Narrative   Single no kids   Uninsured   Smoker - tobacco and marijuana   Alcohol - hx - stopped last time in 11/2019 w/ pancreatitis   Social Determinants of Health   Financial Resource Strain:   . Difficulty of  Paying Living Expenses:   Food Insecurity:   . Worried About Programme researcher, broadcasting/film/videounning Out of Food in the Last Year:   . Baristaan Out of Food in the Last Year:   Transportation Needs:   . Freight forwarderLack of Transportation (Medical):   Marland Kitchen. Lack of Transportation (Non-Medical):   Physical Activity:   . Days of Exercise per Week:   . Minutes of Exercise per Session:   Stress:   . Feeling of Stress :   Social Connections:   . Frequency of Communication with Friends and Family:   . Frequency of Social Gatherings with Friends and Family:   . Attends Religious Services:   . Active Member of Clubs or Organizations:   . Attends BankerClub or Organization Meetings:   Marland Kitchen. Marital Status:   Intimate Partner Violence:   . Fear of Current or Ex-Partner:   . Emotionally Abused:   Marland Kitchen. Physically Abused:   . Sexually Abused:     Review of Systems: ROS is O/W negative except as mentioned in HP>  Physical Exam: Vital signs in last 24 hours: Temp:  [97.5 F (36.4  C)-98.7 F (37.1 C)] 97.5 F (36.4 C) (04/22 0931) Pulse Rate:  [46-75] 58 (04/22 0931) Resp:  [12-19] 14 (04/22 0900) BP: (126-153)/(79-111) 141/92 (04/22 0931) SpO2:  [97 %-100 %] 99 % (04/22 0931)   General:  Alert, Well-developed, well-nourished, pleasant and cooperative in NAD Head:  Normocephalic and atraumatic. Eyes:  Sclera clear, no icterus.  Conjunctiva pink. Ears:  Normal auditory acuity. Mouth:  No deformity or lesions.   Lungs:  Clear throughout to auscultation.  No wheezes, crackles, or rhonchi.  Heart:  Regular rate and rhythm; no murmurs, clicks, rubs, or gallops. Abdomen:  Soft, non-distended.  BS present.  Moderate epigastric and LUQ TTP.   Msk:  Symmetrical without gross deformities. Pulses:  Normal pulses noted. Extremities:  Without clubbing or edema. Neurologic:  Alert and oriented x 4;  grossly normal neurologically. Skin:  Intact without significant lesions or rashes. Psych:  Alert and cooperative. Normal mood and affect.  Intake/Output this shift: Total I/O In: 1000 [IV Piggyback:1000] Out: -   Lab Results: Recent Labs    04/09/20 0416  WBC 8.7  HGB 14.2  HCT 42.4  PLT 198   BMET Recent Labs    04/07/20 1106 04/09/20 0416  NA  --  140  K  --  3.3*  CL  --  100  CO2  --  25  GLUCOSE  --  121*  BUN  --  16  CREATININE 0.60* 0.63  CALCIUM  --  9.8   LFT Recent Labs    04/09/20 0416  PROT 8.4*  ALBUMIN 4.8  AST 38  ALT 45*  ALKPHOS 247*  BILITOT 1.1   Studies/Results: CT ABDOMEN PELVIS W CONTRAST  Result Date: 04/09/2020 CLINICAL DATA:  Abdominal pain for 4 days. History of pancreatitis. EXAM: CT ABDOMEN AND PELVIS WITH CONTRAST TECHNIQUE: Multidetector CT imaging of the abdomen and pelvis was performed using the standard protocol following bolus administration of intravenous contrast. CONTRAST:  100mL OMNIPAQUE IOHEXOL 300 MG/ML  SOLN COMPARISON:  CT scan 04/07/2020 FINDINGS: Lower chest: The lung bases are clear of acute  process. No pleural effusion or pulmonary lesions. The heart is normal in size. No pericardial effusion. The distal esophagus and aorta are unremarkable. Hepatobiliary: No focal hepatic lesions or intrahepatic biliary dilatation. The gallbladder is unremarkable. No common bile duct dilatation. Stable chronic thrombosis/occlusion of the middle and left portal veins  with associated cavernous transformation. The right portal vein and main portal vein are patent. The splenic vein is patent. Pancreas: No mass or ductal dilatation. Largely resolved pancreatic inflammation. No pancreatic necrosis or pseudocysts. Spleen: Normal size. No focal lesions. Adrenals/Urinary Tract: Adrenal glands are normal. Both kidneys are normal. The bladder is normal. Stomach/Bowel: Severe inflammatory process involving the antropyloric region of the stomach with marked wall thickening, submucosal edema and mucosal enhancement. Findings likely representing severe gastritis or peptic ulcer disease without a discrete ulcer. I do not see any involvement of the descending duodenum. The small bowel and colon are unremarkable and stable. Vascular/Lymphatic: Stable age advanced atherosclerotic calcifications involving the distal aorta and iliac arteries. No aneurysm or dissection. The branch vessels are patent. No mesenteric or retroperitoneal mass or adenopathy. Small scattered upper abdominal lymph nodes are stable. Reproductive: The prostate gland and seminal vesicles are unremarkable. Other: No pelvic mass or adenopathy. No free pelvic fluid collections. No inguinal mass or adenopathy. No abdominal wall hernia or subcutaneous lesions. Musculoskeletal: No significant bony findings. IMPRESSION: 1. Severe inflammatory process involving the antropyloric region of the stomach likely severe gastritis or peptic ulcer disease without a discrete ulcer. 2. Largely resolved pancreatic inflammation. 3. Stable chronic thrombosis/occlusion of the middle and  left portal veins with associated cavernous transformation. 4. Stable age advanced atherosclerotic calcifications involving the distal aorta and iliac arteries. Aortic Atherosclerosis (ICD10-I70.0). Electronically Signed   By: Rudie Meyer M.D.   On: 04/09/2020 06:24   CT Abdomen Pelvis W Contrast  Result Date: 04/07/2020 CLINICAL DATA:  Re-evaluate pancreatitis EXAM: CT ABDOMEN AND PELVIS WITH CONTRAST TECHNIQUE: Multidetector CT imaging of the abdomen and pelvis was performed using the standard protocol following bolus administration of intravenous contrast. CONTRAST:  OMNIPAQUE IOHEXOL 300 MG/ML SOLN, additional oral enteric contrast COMPARISON:  01/26/2020, 01/06/2020 FINDINGS: Lower chest: No acute abnormality. Hepatobiliary: Heterogeneous attenuation of the liver parenchyma, likely due to perfusion abnormality in the setting of portal vein thrombosis. No gallstones, gallbladder wall thickening, or biliary dilatation. Pancreas: Interval removal of a previously seen pancreatic ductal stent. No pancreatic ductal dilatation. There is inflammatory stranding about the pancreatic head (series 2, image 31). Spleen: Normal in size without significant abnormality. Adrenals/Urinary Tract: Adrenal glands are unremarkable. Kidneys are normal, without renal calculi, solid lesion, or hydronephrosis. Bladder is unremarkable. Stomach/Bowel: Thickening of the gastric antrum and pylorus (series 2, image 34). Status post appendectomy. No evidence of bowel wall thickening, distention, or inflammatory changes. Vascular/Lymphatic: Redemonstrated thrombosis of the left portal vein with partial cavernous transformation (series 2, image 23). Aortic atherosclerosis. Unchanged enlarged left retroperitoneal lymph nodes. Reproductive: No mass or other significant abnormality. Other: No abdominal wall hernia or abnormality. No abdominopelvic ascites. Musculoskeletal: No acute or significant osseous findings. IMPRESSION: 1.  Interval removal of a previously seen pancreatic ductal stent. No pancreatic ductal dilatation. 2.  Unchanged inflammatory stranding about the pancreatic head. 3. Redemonstrated thrombosis of the left portal vein with partial cavernous transformation. 4. Redemonstrated thickening of the gastric antrum and pylorus, which may be reactive to adjacent inflammation or indicative of infectious or inflammatory gastritis. 5. Unchanged enlarged left retroperitoneal lymph nodes, nonspecific. 6. Aortic atherosclerosis, advanced for patient age. Aortic Atherosclerosis (ICD10-I70.0). Electronically Signed   By: Lauralyn Primes M.D.   On: 04/07/2020 16:11   DG ABD ACUTE 2+V W 1V CHEST  Result Date: 04/09/2020 CLINICAL DATA:  Lower abdominal pain. Personal history of pancreatitis. EXAM: DG ABDOMEN ACUTE W/ 1V CHEST COMPARISON:  CT abdomen and pelvis  with contrast 04/07/2020. Abdominal radiographs 01/08/2019 FINDINGS: The heart size is normal. Lungs are clear. No edema or effusion is present. Bowel gas pattern is normal. Oral contrast is present in the colon. Axial skeleton is within normal limits. IMPRESSION: Negative abdominal radiographs.  No acute cardiopulmonary disease. Electronically Signed   By: Marin Roberts M.D.   On: 04/09/2020 04:48   IMPRESSION:  *Nausea, vomiting, and upper abdominal pain with CT scan showing severe inflammation in the antropyloric region of the stomach, ? Severe gastritis vs PUD with no discrete ulcer seen.  Symptoms improving already.  Had similar issue previously as listed below that caused GOO. *Chronic SMV thrombosis:  On Eliquis, but that has been discontinued and replaced with IV heparin while he is here. *Previous gastric outlet obstruction from severe pyloric and proximal duodenal inflammation thought to be secondary to residual effects of severe pancreatitis  *Previous PD duct leak with PD stent placed 11/2019 and then just removed on 03/17/2020 *History of severe ETOH  pancreatitis in 2020  PLAN: -Continue pantoprazole 40 mg IV BID. -Likely will need EGD.  ? Timing.  Unsure that we can accommodate that today.  Will discuss with Dr. Chales Abrahams.  If not until tomorrow then will allow clear liquids as tolerated today. -Consider adding carafate ACHS as well.   Princella Pellegrini. Zehr  04/09/2020, 9:53 AM   Attending physician's note   I have taken an interval history, reviewed the chart and examined the patient. I agree with the Advanced Practitioner's note, impression and recommendations.   N/V with CT showing severe gastritis.  No definite outlet obstruction. Had recent EGD 03/18/2020 (at time of PD stent removal)- 2 cm HH, mild gastritis with neg Bx for HP. H/O Acute severe ETOH pancreatitis with suspected PD duct leak s/p PD stent insertion 11/2019, SMV thrombosis on eliquis (now on heparin), GOO on EGD 01/29/2020 No NSAIDs or further ETOH.  Plan: -Protonix IV 40 mg twice daily -Carafate elixir 1 g p.o. 4 times daily -Clear liquid diet.  He is able to tolerate it. -Would like to manage conservatively as he had recent EGD as above. -If still with problems, would consider repeat evaluation. -D/W patient and patient's family in detail.  They agree with the plan.  Edman Circle, MD Corinda Gubler Sandria Manly [phone].

## 2020-04-09 NOTE — Progress Notes (Signed)
ANTICOAGULATION CONSULT NOTE - Initial Consult  Pharmacy Consult for heparin Indication: portal vein thrombosis, apixaban on hold  Allergies  Allergen Reactions  . Penicillins Rash    Did it involve swelling of the face/tongue/throat, SOB, or low BP? No Did it involve sudden or severe rash/hives, skin peeling, or any reaction on the inside of your mouth or nose? Yes Did you need to seek medical attention at a hospital or doctor's office? Yes When did it last happen?childhood allergy If all above answers are "NO", may proceed with cephalosporin use.     Patient Measurements: Height: 5\' 9"  (175.3 cm) Weight: 73.9 kg (163 lb) IBW/kg (Calculated) : 70.7 Heparin Dosing Weight: n/a. Use TBW = 74 kg  Vital Signs: Temp: 97.5 F (36.4 C) (04/22 0931) Temp Source: Oral (04/22 0931) BP: 141/92 (04/22 0931) Pulse Rate: 58 (04/22 0931)  Labs: Recent Labs    04/07/20 1106 04/09/20 0416  HGB  --  14.2  HCT  --  42.4  PLT  --  198  CREATININE 0.60* 0.63    Estimated Creatinine Clearance: 124 mL/min (by C-G formula based on SCr of 0.63 mg/dL).   Medical History: Past Medical History:  Diagnosis Date  . Abdominal pain   . Mesenteric vein thrombosis (HCC)    Dec 2020 in setting of pancreatitis  . Nausea & vomiting   . Pancreatitis     Medications: Apixaban 5 mg PO BID PTA  Assessment: 63 yoM admitted with abdominal pain. PMH significant for recurrent pancreatitis, portal vein thrombosis/superior mesenteric vein thrombosis on apixaban PTA. Pharmacy consulted to transition anticoagulation to heparin infusion in anticipation of possible procedures.   Last dose apixaban: 4/21 @ 2200 Baseline labs: Hgb 14.2, Plt 198. APTT, HL, protime-INR pending  Today, 04/09/20   CBC: WNL  Baseline HL pending. Expect it to be falsely elevated due to recent DOAC. If elevated, will dose/monitor heparin using aPTT.  SCr 0.63, CrCl > 60 mL/min  Goal of Therapy:  APTT 66-102  seconds Heparin level 0.3-0.7 units/ml Monitor platelets by anticoagulation protocol: Yes   Plan:   No initial heparin bolus as anticoagulated PTA with apixaban  Start heparin infusion 12 hours after last apixaban dose  Heparin infusion of 1200 units/hr  Check 6 hour APTT/HL. If baseline  HL elevated, will dose/monitor using aPTT until aPTT and HL correlate.   CBC, HL/APTT daily  Monitor for signs/symptoms of bleeding  04/11/20, PharmD 04/09/2020,11:08 AM

## 2020-04-09 NOTE — ED Triage Notes (Addendum)
Pt reports abdominal pain since this Sunday. Hx of pancreatitis and several abdominal procedures. He states that he had a CT yesterday. He called [facility] and spoke with Dr. Christella Hartigan. Reports vomiting a couple hours ago. Reports hx of same.

## 2020-04-09 NOTE — Progress Notes (Signed)
ANTICOAGULATION CONSULT NOTE Pharmacy Consult for heparin Indication: portal vein thrombosis, apixaban on hold  Allergies  Allergen Reactions  . Penicillins Rash    Did it involve swelling of the face/tongue/throat, SOB, or low BP? No Did it involve sudden or severe rash/hives, skin peeling, or any reaction on the inside of your mouth or nose? Yes Did you need to seek medical attention at a hospital or doctor's office? Yes When did it last happen?childhood allergy If all above answers are "NO", may proceed with cephalosporin use.     Patient Measurements: Height: 5\' 9"  (175.3 cm) Weight: 73.9 kg (163 lb) IBW/kg (Calculated) : 70.7 Heparin Dosing Weight: n/a. Use TBW = 74 kg  Vital Signs: Temp: 98.1 F (36.7 C) (04/22 1712) Temp Source: Oral (04/22 1712) BP: 146/90 (04/22 1712) Pulse Rate: 56 (04/22 1712)  Labs: Recent Labs    04/07/20 1106 04/09/20 0416 04/09/20 1101 04/09/20 1750  HGB  --  14.2  --   --   HCT  --  42.4  --   --   PLT  --  198  --   --   APTT  --   --  32 68*  LABPROT  --   --  14.0  --   INR  --   --  1.1  --   HEPARINUNFRC  --   --  0.64  --   CREATININE 0.60* 0.63  --   --     Estimated Creatinine Clearance: 124 mL/min (by C-G formula based on SCr of 0.63 mg/dL).   Medications: Apixaban 5 mg PO BID PTA  Assessment: 37 yoM admitted with abdominal pain. PMH significant for recurrent pancreatitis, portal vein thrombosis/superior mesenteric vein thrombosis on apixaban PTA. Pharmacy consulted to transition anticoagulation to heparin infusion in anticipation of possible procedures.   Last dose apixaban: 4/21 @ 2200 Baseline labs: Hgb 14.2, Plt 198. APTT, HL, protime-INR pending  Today, 04/09/20   CBC: WNL  Baseline HL pending. Expect it to be falsely elevated due to recent DOAC. If elevated, will dose/monitor heparin using aPTT.  SCr 0.63, CrCl > 60 mL/min  2nd shift: first APTT is therapeutic = 68 seconds after heparin drip started  at 1200 units/hr.  No line issues or bleeding issues reported.   Goal of Therapy:  APTT 66-102 seconds Heparin level 0.3-0.7 units/ml Monitor platelets by anticoagulation protocol: Yes   Plan:   Continue Heparin infusion at 1200 units/hr   dose/monitor using aPTT until aPTT and HL correlate.   CBC, HL/APTT daily  Monitor for signs/symptoms of bleeding  04/11/20, Pharm.D [phone] 04/09/2020 6:29 PM

## 2020-04-09 NOTE — H&P (Addendum)
History and Physical    Paul Allen GBT:[id number] DOB: 04-05-1981 DOA: 04/09/2020  PCP: Grayce Sessions, NP   Patient coming from: Home  I have personally briefly reviewed patient's old medical records in [facility]  Chief Complaint: Abdominal pain  HPI: Paul Allen is a 39 y.o. male with medical history significant of portal vein thrombosis/superior mesenteric vein thrombosis on Eliquis, recurrent pancreatitis with presumed pancreatic leak causing significant duodenal inflammation requiring PD stent which was removed by GI on 03/17/2020 presented with progressively worsening abdominal pain that started 5-6 days ago.  Patient has had intermittent progressively worsening abdominal pain, mostly in the left upper quadrant and mostly in the morning which would subside by itself.  He initially had nausea and an episode of vomiting as well.  He had an outpatient CT of the abdomen done 1 for the same reason.  His abdominal pain started to get worse and more constant and she presented to the ED.  It was up to 8 out of 10 in intensity, sharp, no radiation and no aggravating or relieving factors.  No diarrhea, black or bloody bowel movements, bloody vomiting, shortness of breath, chest pain, fever, loss of consciousness, seizures.  ED Course: CT of the abdomen with contrast showed severe inflammatory process involving the antropyloric region of the stomach likely severe gastritis or peptic ulcer disease without a discrete ulcer; largely resolved pancreatic inflammation.  ED provider spoke to on-call GI/Dr. Chales Abrahams who recommended kept in the hospital for further observation under hospitalist service.  Hospitalist service was called evaluate the patient  Review of Systems: As per HPI otherwise; all other  systems were reviewed and are negative.   Past Medical History:  Diagnosis Date  . Abdominal pain   . Mesenteric vein thrombosis (HCC)    Dec 2020 in setting of pancreatitis  . Nausea  & vomiting   . Pancreatitis     Past Surgical History:  Procedure Laterality Date  . APPENDECTOMY    . BIOPSY  01/29/2020   Procedure: BIOPSY;  Surgeon: Rachael Fee, MD;  Location: [facility];  Service: Gastroenterology;;  . BIOPSY  03/17/2020   Procedure: BIOPSY;  Surgeon: Lemar Lofty., MD;  Location: [facility];  Service: Gastroenterology;;  . ERCP N/A 12/06/2019   Procedure: ENDOSCOPIC RETROGRADE CHOLANGIOPANCREATOGRAPHY (ERCP);  Surgeon: Rachael Fee, MD;  Location: [facility];  Service: Endoscopy;  Laterality: N/A;  Aborted Procedure  . ERCP N/A 12/11/2019   Procedure: ENDOSCOPIC RETROGRADE CHOLANGIOPANCREATOGRAPHY (ERCP);  Surgeon: Lemar Lofty., MD;  Location: [facility];  Service: Gastroenterology;  Laterality: N/A;  . ESOPHAGOGASTRODUODENOSCOPY (EGD) WITH PROPOFOL N/A 01/29/2020   Procedure: ESOPHAGOGASTRODUODENOSCOPY (EGD) WITH PROPOFOL;  Surgeon: Rachael Fee, MD;  Location: [facility];  Service: Gastroenterology;  Laterality: N/A;  . ESOPHAGOGASTRODUODENOSCOPY (EGD) WITH PROPOFOL N/A 03/17/2020   Procedure: ESOPHAGOGASTRODUODENOSCOPY (EGD) WITH PROPOFOL;  Surgeon: Meridee Score Netty Starring., MD;  Location: [facility];  Service: Gastroenterology;  Laterality: N/A;  . PANCREATIC STENT PLACEMENT  12/11/2019   Procedure: PANCREATIC STENT PLACEMENT;  Surgeon: Meridee Score Netty Starring., MD;  Location: [facility];  Service: Gastroenterology;;  . Dennison Mascot  12/11/2019   Procedure: Dennison Mascot;  Surgeon: Lemar Lofty., MD;  Location: [facility];  Service: Gastroenterology;;  . Francine Graven REMOVAL  03/17/2020   Procedure: STENT REMOVAL;  Surgeon: Lemar Lofty., MD;  Location: [facility];  Service: Gastroenterology;;   Social history  reports that he quit smoking about 4 months ago. His smoking use included cigarettes. He smoked  1.50 packs per day. He has never used smokeless tobacco. He reports previous alcohol use. He  reports current drug use. Frequency: 7.00 times per week. Drug: Marijuana.  Allergies  Allergen Reactions  . Penicillins Rash    Did it involve swelling of the face/tongue/throat, SOB, or low BP? No Did it involve sudden or severe rash/hives, skin peeling, or any reaction on the inside of your mouth or nose? Yes Did you need to seek medical attention at a hospital or doctor's office? Yes When did it last happen?childhood allergy If all above answers are "NO", may proceed with cephalosporin use.     Family History  Problem Relation Age of Onset  . Cholecystitis Mother   . Arthritis Mother   . Stomach cancer Father     Prior to Admission medications   Medication Sig Start Date End Date Taking? Authorizing Provider  acetaminophen (TYLENOL) 500 MG tablet Take 1,000 mg by mouth every 6 (six) hours as needed for moderate pain or headache.   Yes [provider]  apixaban (ELIQUIS) 5 MG TABS tablet Take 1 tablet (5 mg total) by mouth 2 (two) times daily. 03/23/20  Yes Mansouraty, Netty StarringGabriel Jr., MD  folic acid (FOLVITE) 1 MG tablet Take 1 tablet (1 mg total) by mouth daily. 12/15/19  Yes Danford, Earl Liteshristopher P, MD  pantoprazole (PROTONIX) 40 MG tablet Take 1 tablet (40 mg total) by mouth 2 (two) times daily. 03/17/20 04/16/20 Yes Mansouraty, Netty StarringGabriel Jr., MD  thiamine 100 MG tablet Take 1 tablet (100 mg total) by mouth daily. 12/15/19  Yes Alberteen Samanford, Christopher P, MD    Physical Exam: Vitals:   04/09/20 0715 04/09/20 0730 04/09/20 0745 04/09/20 0800  BP: 131/84 128/81 139/89 140/86  Pulse: (!) 46 (!) 49 (!) 53 (!) 52  Resp: 14 16 19 13   Temp:    98.7 F (37.1 C)  TempSrc:    Oral  SpO2: 100% 99% 99% 98%    Constitutional: NAD, calm, comfortable Vitals:   04/09/20 0715 04/09/20 0730 04/09/20 0745 04/09/20 0800  BP: 131/84 128/81 139/89 140/86  Pulse: (!) 46 (!) 49 (!) 53 (!) 52  Resp: 14 16 19 13   Temp:    98.7 F (37.1 C)  TempSrc:    Oral  SpO2: 100% 99% 99% 98%    Eyes: PERRL, lids and conjunctivae normal ENMT: Mucous membranes are dry. Posterior pharynx clear of any exudate or lesions. Neck: normal, supple, no masses, no thyromegaly Respiratory: bilateral decreased breath sounds at bases, no wheezing, no crackles. Normal respiratory effort. No accessory muscle use.  Cardiovascular: S1 S2 positive, intermittently bradycardic.  No extremity edema. 2+ pedal pulses.  Abdomen: Mild diffuse epigastric tenderness present, no masses palpated. No hepatosplenomegaly. Bowel sounds positive.  Musculoskeletal: no clubbing / cyanosis. No joint deformity upper and lower extremities.  Skin: no rashes, lesions, ulcers. No induration Neurologic: CN 2-12 grossly intact. Moving extremities. No focal neurologic deficits.  Psychiatric: Normal judgment and insight. Alert and oriented x 3. Normal mood.    Labs on Admission: I have personally reviewed following labs and imaging studies  CBC: Recent Labs  Lab 04/09/20 0416  WBC 8.7  HGB 14.2  HCT 42.4  MCV 95.7  PLT 198   Basic Metabolic Panel: Recent Labs  Lab 04/07/20 1106 04/09/20 0416  NA  --  140  K  --  3.3*  CL  --  100  CO2  --  25  GLUCOSE  --  121*  BUN  --  16  CREATININE 0.60* 0.63  CALCIUM  --  9.8   GFR: CrCl cannot be calculated (Unknown ideal weight.). Liver Function Tests: Recent Labs  Lab 04/09/20 0416  AST 38  ALT 45*  ALKPHOS 247*  BILITOT 1.1  PROT 8.4*  ALBUMIN 4.8   Recent Labs  Lab 04/09/20 0416  LIPASE 50   No results for input(s): AMMONIA in the last 168 hours. Coagulation Profile: No results for input(s): INR, PROTIME in the last 168 hours. Cardiac Enzymes: No results for input(s): CKTOTAL, CKMB, CKMBINDEX, TROPONINI in the last 168 hours. BNP (last 3 results) No results for input(s): PROBNP in the last 8760 hours. HbA1C: No results for input(s): HGBA1C in the last 72 hours. CBG: No results for input(s): GLUCAP in the last 168 hours. Lipid Profile: No  results for input(s): CHOL, HDL, LDLCALC, TRIG, CHOLHDL, LDLDIRECT in the last 72 hours. Thyroid Function Tests: No results for input(s): TSH, T4TOTAL, FREET4, T3FREE, THYROIDAB in the last 72 hours. Anemia Panel: No results for input(s): VITAMINB12, FOLATE, FERRITIN, TIBC, IRON, RETICCTPCT in the last 72 hours. Urine analysis:    Component Value Date/Time   COLORURINE AMBER (A) 04/09/2020 0507   APPEARANCEUR CLEAR 04/09/2020 0507   LABSPEC 1.030 04/09/2020 0507   PHURINE 5.0 04/09/2020 0507   GLUCOSEU NEGATIVE 04/09/2020 0507   HGBUR NEGATIVE 04/09/2020 0507   BILIRUBINUR NEGATIVE 04/09/2020 0507   KETONESUR 5 (A) 04/09/2020 0507   PROTEINUR 30 (A) 04/09/2020 0507   UROBILINOGEN 1.0 01/13/2008 2059   NITRITE NEGATIVE 04/09/2020 0507   LEUKOCYTESUR NEGATIVE 04/09/2020 0507    Radiological Exams on Admission: CT ABDOMEN PELVIS W CONTRAST  Result Date: 04/09/2020 CLINICAL DATA:  Abdominal pain for 4 days. History of pancreatitis. EXAM: CT ABDOMEN AND PELVIS WITH CONTRAST TECHNIQUE: Multidetector CT imaging of the abdomen and pelvis was performed using the standard protocol following bolus administration of intravenous contrast. CONTRAST:  168mL OMNIPAQUE IOHEXOL 300 MG/ML  SOLN COMPARISON:  CT scan 04/07/2020 FINDINGS: Lower chest: The lung bases are clear of acute process. No pleural effusion or pulmonary lesions. The heart is normal in size. No pericardial effusion. The distal esophagus and aorta are unremarkable. Hepatobiliary: No focal hepatic lesions or intrahepatic biliary dilatation. The gallbladder is unremarkable. No common bile duct dilatation. Stable chronic thrombosis/occlusion of the middle and left portal veins with associated cavernous transformation. The right portal vein and main portal vein are patent. The splenic vein is patent. Pancreas: No mass or ductal dilatation. Largely resolved pancreatic inflammation. No pancreatic necrosis or pseudocysts. Spleen: Normal size. No  focal lesions. Adrenals/Urinary Tract: Adrenal glands are normal. Both kidneys are normal. The bladder is normal. Stomach/Bowel: Severe inflammatory process involving the antropyloric region of the stomach with marked wall thickening, submucosal edema and mucosal enhancement. Findings likely representing severe gastritis or peptic ulcer disease without a discrete ulcer. I do not see any involvement of the descending duodenum. The small bowel and colon are unremarkable and stable. Vascular/Lymphatic: Stable age advanced atherosclerotic calcifications involving the distal aorta and iliac arteries. No aneurysm or dissection. The branch vessels are patent. No mesenteric or retroperitoneal mass or adenopathy. Small scattered upper abdominal lymph nodes are stable. Reproductive: The prostate gland and seminal vesicles are unremarkable. Other: No pelvic mass or adenopathy. No free pelvic fluid collections. No inguinal mass or adenopathy. No abdominal wall hernia or subcutaneous lesions. Musculoskeletal: No significant bony findings. IMPRESSION: 1. Severe inflammatory process involving the antropyloric region of the stomach likely severe gastritis or peptic ulcer disease  without a discrete ulcer. 2. Largely resolved pancreatic inflammation. 3. Stable chronic thrombosis/occlusion of the middle and left portal veins with associated cavernous transformation. 4. Stable age advanced atherosclerotic calcifications involving the distal aorta and iliac arteries. Aortic Atherosclerosis (ICD10-I70.0). Electronically Signed   By: Rudie Meyer M.D.   On: 04/09/2020 06:24   CT Abdomen Pelvis W Contrast  Result Date: 04/07/2020 CLINICAL DATA:  Re-evaluate pancreatitis EXAM: CT ABDOMEN AND PELVIS WITH CONTRAST TECHNIQUE: Multidetector CT imaging of the abdomen and pelvis was performed using the standard protocol following bolus administration of intravenous contrast. CONTRAST:  OMNIPAQUE IOHEXOL 300 MG/ML SOLN, additional oral  enteric contrast COMPARISON:  01/26/2020, 01/06/2020 FINDINGS: Lower chest: No acute abnormality. Hepatobiliary: Heterogeneous attenuation of the liver parenchyma, likely due to perfusion abnormality in the setting of portal vein thrombosis. No gallstones, gallbladder wall thickening, or biliary dilatation. Pancreas: Interval removal of a previously seen pancreatic ductal stent. No pancreatic ductal dilatation. There is inflammatory stranding about the pancreatic head (series 2, image 31). Spleen: Normal in size without significant abnormality. Adrenals/Urinary Tract: Adrenal glands are unremarkable. Kidneys are normal, without renal calculi, solid lesion, or hydronephrosis. Bladder is unremarkable. Stomach/Bowel: Thickening of the gastric antrum and pylorus (series 2, image 34). Status post appendectomy. No evidence of bowel wall thickening, distention, or inflammatory changes. Vascular/Lymphatic: Redemonstrated thrombosis of the left portal vein with partial cavernous transformation (series 2, image 23). Aortic atherosclerosis. Unchanged enlarged left retroperitoneal lymph nodes. Reproductive: No mass or other significant abnormality. Other: No abdominal wall hernia or abnormality. No abdominopelvic ascites. Musculoskeletal: No acute or significant osseous findings. IMPRESSION: 1. Interval removal of a previously seen pancreatic ductal stent. No pancreatic ductal dilatation. 2.  Unchanged inflammatory stranding about the pancreatic head. 3. Redemonstrated thrombosis of the left portal vein with partial cavernous transformation. 4. Redemonstrated thickening of the gastric antrum and pylorus, which may be reactive to adjacent inflammation or indicative of infectious or inflammatory gastritis. 5. Unchanged enlarged left retroperitoneal lymph nodes, nonspecific. 6. Aortic atherosclerosis, advanced for patient age. Aortic Atherosclerosis (ICD10-I70.0). Electronically Signed   By: Lauralyn Primes M.D.   On: 04/07/2020  16:11   DG ABD ACUTE 2+V W 1V CHEST  Result Date: 04/09/2020 CLINICAL DATA:  Lower abdominal pain. Personal history of pancreatitis. EXAM: DG ABDOMEN ACUTE W/ 1V CHEST COMPARISON:  CT abdomen and pelvis with contrast 04/07/2020. Abdominal radiographs 01/08/2019 FINDINGS: The heart size is normal. Lungs are clear. No edema or effusion is present. Bowel gas pattern is normal. Oral contrast is present in the colon. Axial skeleton is within normal limits. IMPRESSION: Negative abdominal radiographs.  No acute cardiopulmonary disease. Electronically Signed   By: Marin Roberts M.D.   On: 04/09/2020 04:48    EKG: Independently reviewed.  Normal sinus rhythm.  No ST elevations or depressions  Assessment/Plan  Severe abdominal pain/probable severe gastritis versus peptic ulcer disease History of recurrent pancreatitis with pancreatic leak status post PD stent removal on 03/17/2020 -Patient had his PD stent removed on 03/17/2020 by Dr. Mansouraty/GI -Presented with progressively worsening intermittent abdominal pain and CT of the abdomen showing probable severe gastritis versus peptic ulcer disease without ulcer -GI has been consulted by ED provider.  Will follow further GI recommendations -N.p.o. for now.  Pain management.  Antiemetics as needed -Protonix 40 mg IV every 12 hours  History of portal vein thrombosis/superior mesenteric vein thrombosis -On Eliquis as an outpatient.  Will treat with IV heparin in case patient needs EGD  DVT prophylaxis: Heparin Code Status: Full  Family Communication: Spoke to patient at bedside Disposition Plan: Home in 1 to 2 days once clinically improved and cleared by GI Consults called: GI called by ED provider Admission status: Observation/MedSurg  Severity of Illness: The appropriate patient status for this patient is OBSERVATION. Observation status is judged to be reasonable and necessary in order to provide the required intensity of service to ensure the  patient's safety. The patient's presenting symptoms, physical exam findings, and initial radiographic and laboratory data in the context of their medical condition is felt to place them at decreased risk for further clinical deterioration. Furthermore, it is anticipated that the patient will be medically stable for discharge from the hospital within 2 midnights of admission. The following factors support the patient status of observation.   " The patient's presenting symptoms include severe abdominal pain/vomiting. " The physical exam findings include epigastric tenderness. " The initial radiographic and laboratory data are severe gastritis/peptic ulcer disease.       Glade Lloyd MD [facility]  04/09/2020, 8:51 AM

## 2020-04-10 DIAGNOSIS — R1013 Epigastric pain: Secondary | ICD-10-CM

## 2020-04-10 DIAGNOSIS — R1010 Upper abdominal pain, unspecified: Secondary | ICD-10-CM

## 2020-04-10 DIAGNOSIS — K2900 Acute gastritis without bleeding: Secondary | ICD-10-CM | POA: Diagnosis not present

## 2020-04-10 LAB — COMPREHENSIVE METABOLIC PANEL
ALT: 34 U/L (ref 0–44)
AST: 30 U/L (ref 15–41)
Albumin: 4 g/dL (ref 3.5–5.0)
Alkaline Phosphatase: 180 U/L — ABNORMAL HIGH (ref 38–126)
Anion gap: 9 (ref 5–15)
BUN: 10 mg/dL (ref 6–20)
CO2: 25 mmol/L (ref 22–32)
Calcium: 8.9 mg/dL (ref 8.9–10.3)
Chloride: 104 mmol/L (ref 98–111)
Creatinine, Ser: 0.64 mg/dL (ref 0.61–1.24)
GFR calc Af Amer: >60 mL/min (ref >60)
GFR calc non Af Amer: >60 mL/min (ref >60)
Glucose, Bld: 99 mg/dL (ref 70–99)
Potassium: 3.5 mmol/L (ref 3.5–5.1)
Sodium: 138 mmol/L (ref 135–145)
Total Bilirubin: 1 mg/dL (ref 0.3–1.2)
Total Protein: 6.5 g/dL (ref 6.5–8.1)

## 2020-04-10 LAB — HEPARIN LEVEL (UNFRACTIONATED): Heparin Unfractionated: 0.76 IU/mL — ABNORMAL HIGH (ref 0.30–0.70)

## 2020-04-10 LAB — CBC
HCT: 36.5 % — ABNORMAL LOW (ref 39.0–52.0)
Hemoglobin: 12.2 g/dL — ABNORMAL LOW (ref 13.0–17.0)
MCH: 32.4 pg (ref 26.0–34.0)
MCHC: 33.4 g/dL (ref 30.0–36.0)
MCV: 97.1 fL (ref 80.0–100.0)
Platelets: 168 10*3/uL (ref 150–400)
RBC: 3.76 MIL/uL — ABNORMAL LOW (ref 4.22–5.81)
RDW: 14.7 % (ref 11.5–15.5)
WBC: 6.6 10*3/uL (ref 4.0–10.5)
nRBC: 0 % (ref 0.0–0.2)

## 2020-04-10 LAB — MAGNESIUM: Magnesium: 1.8 mg/dL (ref 1.7–2.4)

## 2020-04-10 LAB — APTT: aPTT: 73 seconds — ABNORMAL HIGH (ref 24–36)

## 2020-04-10 MED ORDER — ONDANSETRON HCL 4 MG PO TABS: 4.0000 mg | ORAL_TABLET | Freq: Four times a day (QID) | ORAL | 0 refills | Status: DC | PRN

## 2020-04-10 MED ORDER — SUCRALFATE 1 G PO TABS: 1.0000 g | ORAL_TABLET | Freq: Three times a day (TID) | ORAL | 2 refills | Status: DC

## 2020-04-10 MED ORDER — SUCRALFATE 1 G PO TABS
1.0000 g | ORAL_TABLET | Freq: Three times a day (TID) | ORAL | Status: DC
  Administered 2020-04-10 (×2): 1 g via ORAL
  Filled 2020-04-10 (×2): qty 1

## 2020-04-10 NOTE — Progress Notes (Signed)
ANTICOAGULATION CONSULT NOTE - Follow Up Consult  Pharmacy Consult for Heparin Indication: portal vein thrombosis, apixaban on hold  Allergies  Allergen Reactions  . Penicillins Rash    Did it involve swelling of the face/tongue/throat, SOB, or low BP? No Did it involve sudden or severe rash/hives, skin peeling, or any reaction on the inside of your mouth or nose? Yes Did you need to seek medical attention at a hospital or doctor's office? Yes When did it last happen?childhood allergy If all above answers are "NO", may proceed with cephalosporin use.     Patient Measurements: Height: 5\' 9"  (175.3 cm) Weight: 73.9 kg (163 lb) IBW/kg (Calculated) : 70.7 Heparin Dosing Weight:   Vital Signs: Temp: 98.6 F (37 C) (04/22 2149) Temp Source: Oral (04/22 1712) BP: 124/80 (04/22 2149) Pulse Rate: 55 (04/22 2149)  Labs: Recent Labs    04/07/20 1106 04/09/20 0416 04/09/20 1101 04/09/20 1750 04/10/20 0413  HGB  --  14.2  --   --   --   HCT  --  42.4  --   --   --   PLT  --  198  --   --   --   APTT  --   --  32 68* 73*  LABPROT  --   --  14.0  --   --   INR  --   --  1.1  --   --   HEPARINUNFRC  --   --  0.64  --  0.76*  CREATININE 0.60* 0.63  --   --  0.64    Estimated Creatinine Clearance: 124 mL/min (by C-G formula based on SCr of 0.64 mg/dL).   Medications:  Infusions:  . sodium chloride 150 mL/hr (04/09/20 1812)  . heparin 1,200 Units/hr (04/09/20 1201)    Assessment: Patient with PTT at goal and high heparin level.  PTT ordered with Heparin level until both correlate due to possible drug-lab interaction between oral anticoagulant (rivaroxaban, edoxaban, or apixaban) and anti-Xa level (aka heparin level) No heparin issues per RN.  Goal of Therapy:  Heparin level 0.3-0.7 units/ml aPTT 66-102 seconds Monitor platelets by anticoagulation protocol: Yes   Plan:  Continue heparin drip at current rate Recheck level with AM labs  04/11/20  [place] 04/10/2020,4:46 AM

## 2020-04-10 NOTE — Progress Notes (Addendum)
[facility] Progress Note  CC:  Abnormal CT scan, nausea, vomiting, abdominal pain  Subjective:  Feeling much better.  Wants to try some soft food.  No BM since hospitalization.    Objective:  Vital signs in last 24 hours: Temp:  [97.5 F (36.4 C)-98.6 F (37 C)] 98.4 F (36.9 C) (04/23 0611) Pulse Rate:  [50-58] 50 (04/23 0611) Resp:  [16-18] 16 (04/23 0611) BP: (124-146)/(80-92) 146/88 (04/23 0611) SpO2:  [98 %-100 %] 99 % (04/23 0611) Weight:  [73.9 kg] 73.9 kg (04/22 0931) Last BM Date: 04/08/20 General:  Alert, Well-developed, in NAD Heart:  Slightly bradycardic but regular rhythm; no murmurs Pulm:  CTAB.  No increased WOB. Abdomen:  Soft, non-distended.  BS present.  Mild epigastric and LUQ TTP.  Extremities:  Without edema. Neurologic:  Alert and oriented x 4;  grossly normal neurologically. Psych:  Alert and cooperative. Normal mood and affect.  Intake/Output from previous day: 04/22 0701 - 04/23 0700 In: 5067.2 [P.O.:1080; I.V.:2987.2; IV Piggyback:1000] Out: 1950 [Urine:1950]  Lab Results: Recent Labs    04/09/20 0416 04/10/20 0424  WBC 8.7 6.6  HGB 14.2 12.2*  HCT 42.4 36.5*  PLT 198 168   BMET Recent Labs    04/07/20 1106 04/09/20 0416 04/10/20 0413  NA  --  140 138  K  --  3.3* 3.5  CL  --  100 104  CO2  --  25 25  GLUCOSE  --  121* 99  BUN  --  16 10  CREATININE 0.60* 0.63 0.64  CALCIUM  --  9.8 8.9   LFT Recent Labs    04/10/20 0413  PROT 6.5  ALBUMIN 4.0  AST 30  ALT 34  ALKPHOS 180*  BILITOT 1.0   PT/INR Recent Labs    04/09/20 1101  LABPROT 14.0  INR 1.1   CT ABDOMEN PELVIS W CONTRAST  Result Date: 04/09/2020 CLINICAL DATA:  Abdominal pain for 4 days. History of pancreatitis. EXAM: CT ABDOMEN AND PELVIS WITH CONTRAST TECHNIQUE: Multidetector CT imaging of the abdomen and pelvis was performed using the standard protocol following bolus administration of intravenous contrast. CONTRAST:  OMNIPAQUE  IOHEXOL 300 MG/ML  SOLN COMPARISON:  CT scan 04/07/2020 FINDINGS: Lower chest: The lung bases are clear of acute process. No pleural effusion or pulmonary lesions. The heart is normal in size. No pericardial effusion. The distal esophagus and aorta are unremarkable. Hepatobiliary: No focal hepatic lesions or intrahepatic biliary dilatation. The gallbladder is unremarkable. No common bile duct dilatation. Stable chronic thrombosis/occlusion of the middle and left portal veins with associated cavernous transformation. The right portal vein and main portal vein are patent. The splenic vein is patent. Pancreas: No mass or ductal dilatation. Largely resolved pancreatic inflammation. No pancreatic necrosis or pseudocysts. Spleen: Normal size. No focal lesions. Adrenals/Urinary Tract: Adrenal glands are normal. Both kidneys are normal. The bladder is normal. Stomach/Bowel: Severe inflammatory process involving the antropyloric region of the stomach with marked wall thickening, submucosal edema and mucosal enhancement. Findings likely representing severe gastritis or peptic ulcer disease without a discrete ulcer. I do not see any involvement of the descending duodenum. The small bowel and colon are unremarkable and stable. Vascular/Lymphatic: Stable age advanced atherosclerotic calcifications involving the distal aorta and iliac arteries. No aneurysm or dissection. The branch vessels are patent. No mesenteric or retroperitoneal mass or adenopathy. Small scattered upper abdominal lymph nodes are stable. Reproductive: The prostate gland and seminal vesicles are unremarkable. Other: No pelvic  mass or adenopathy. No free pelvic fluid collections. No inguinal mass or adenopathy. No abdominal wall hernia or subcutaneous lesions. Musculoskeletal: No significant bony findings. IMPRESSION: 1. Severe inflammatory process involving the antropyloric region of the stomach likely severe gastritis or peptic ulcer disease without a  discrete ulcer. 2. Largely resolved pancreatic inflammation. 3. Stable chronic thrombosis/occlusion of the middle and left portal veins with associated cavernous transformation. 4. Stable age advanced atherosclerotic calcifications involving the distal aorta and iliac arteries. Aortic Atherosclerosis (ICD10-I70.0). Electronically Signed   By: Marijo Sanes M.D.   On: 04/09/2020 06:24   DG ABD ACUTE 2+V W 1V CHEST  Result Date: 04/09/2020 CLINICAL DATA:  Lower abdominal pain. Personal history of pancreatitis. EXAM: DG ABDOMEN ACUTE W/ 1V CHEST COMPARISON:  CT abdomen and pelvis with contrast 04/07/2020. Abdominal radiographs 01/08/2019 FINDINGS: The heart size is normal. Lungs are clear. No edema or effusion is present. Bowel gas pattern is normal. Oral contrast is present in the colon. Axial skeleton is within normal limits. IMPRESSION: Negative abdominal radiographs.  No acute cardiopulmonary disease. Electronically Signed   By: San Morelle M.D.   On: 04/09/2020 04:48    Assessment / Plan: *Nausea, vomiting, and upper abdominal pain with CT scan showing severe inflammation in the antropyloric region of the stomach, ? Severe gastritis vs PUD with no discrete ulcer seen.  Symptoms improving already.  Had similar issue previously as listed below that caused GOO.  Improved significantly. *Chronic SMV thrombosis:  On Eliquis, but that has been discontinued and replaced with IV heparin while he is here. *Previous gastric outlet obstruction from severe pyloric and proximal duodenal inflammation thought to be secondary to residual effects of severe pancreatitis  *Previous PD duct leak with PD stent placed 11/2019 and then just removed on 03/17/2020 *History of severe ETOH pancreatitis in 2020  -Will advance to soft diet.  If tolerates this then ok for discharge later this afternoon.  He has OV follow-up with [place] in 4 weeks and that appt is listed in his discharge paperwork/instructions. -Continue  pantoprazole 40 mg BID. -Discharge with carafate ACHS (send with tablet since it is usually much cheaper) as well. -No NSAID's (has not been taking them, but continue to avoid). -Resume Eliquis upon D/C.   LOS: 0 days   Laban Emperor. Zehr  04/10/2020, 9:03 AM   Attending physician's note   I have taken an interval history, reviewed the chart and examined the patient. I agree with the Advanced Practitioner's note, impression and recommendations.   Feels better on Carafate and Protonix. Diet advanced to soft diet. If tolerates, D/C home with GI FU  Carmell Austria, MD [facility] [phone].

## 2020-04-10 NOTE — Progress Notes (Signed)
Pt alert and oriented. Tolerating diet. D/C instructions given. Pt d/cd home. 

## 2020-04-10 NOTE — Discharge Summary (Signed)
Physician Discharge Summary  Paul CreedBrian E Allen BJY:[id number] DOB: 07/13/1981 DOA: 04/09/2020  PCP: Grayce SessionsEdwards, Michelle P, NP  Admit date: 04/09/2020 Discharge date: 04/10/2020  Admitted From: Home Disposition Home Recommendations for Outpatient Follow-up:  1. Follow up with PCP in 1-2 weeks 2. Please obtain BMP/CBC in one week Please follow up with GI Home Health none Equipment/Devices: None  Discharge Condition: Stable and improved CODE STATUS full code  diet recommendation: Cardiac  Brief/Interim Summary:38 y.o. male with medical history significant of portal vein thrombosis/superior mesenteric vein thrombosis on Eliquis, recurrent pancreatitis with presumed pancreatic leak causing significant duodenal inflammation requiring PD stent which was removed by GI on 03/17/2020 presented with progressively worsening abdominal pain that started 5-6 days ago.  Patient has had intermittent progressively worsening abdominal pain, mostly in the left upper quadrant and mostly in the morning which would subside by itself.  He initially had nausea and an episode of vomiting as well.  He had an outpatient CT of the abdomen done 1 for the same reason.  His abdominal pain started to get worse and more constant and she presented to the ED.  It was up to 8 out of 10 in intensity, sharp, no radiation and no aggravating or relieving factors.  No diarrhea, black or bloody bowel movements, bloody vomiting, shortness of breath, chest pain, fever, loss of consciousness, seizures.  ED Course: CT of the abdomen with contrast showed severe inflammatory process involving the antropyloric region of the stomach likely severe gastritis or peptic ulcer disease without a discrete ulcer; largely resolved pancreatic inflammation.  ED provider spoke to on-call GI/Dr. Chales AbrahamsGupta who recommended kept in the hospital for further observation under hospitalist service.  Hospitalist service was called evaluate the patient  Discharge Diagnoses:   Active Problems:   Abdominal pain  Nausea and vomiting with abdominal pain possible severe gastritis versus ulcer.  CT showing significant inflammation in the antropyloric region of the stomach.  Symptoms improved with Protonix and sucralfate.  Will discharge patient today on both the medications.  He will follow up with GI in 4 weeks. Patient tolerated a soft diet prior to discharge.  We will continue Protonix 40 mg twice a day with sucralfate.  No NSAIDs.  Chronic SMV thrombosis continue Eliquis.  History of gastric outlet obstruction with history of pancreatitis stable  History of pancreatic duct leak with stent stent removed 03/17/2020.  History of alcohol abuse and alcohol induced pancreatitis.   Estimated body mass index is 24.07 kg/m as calculated from the following:   Height as of this encounter: 5\' 9"  (1.753 m).   Weight as of this encounter: 73.9 kg.  Discharge Instructions  Discharge Instructions    Diet - low sodium heart healthy   Complete by: As directed    Increase activity slowly   Complete by: As directed      Allergies as of 04/10/2020      Reactions   Penicillins Rash   Did it involve swelling of the face/tongue/throat, SOB, or low BP? No Did it involve sudden or severe rash/hives, skin peeling, or any reaction on the inside of your mouth or nose? Yes Did you need to seek medical attention at a hospital or doctor's office? Yes When did it last happen?childhood allergy If all above answers are "NO", may proceed with cephalosporin use.      Medication List    TAKE these medications   acetaminophen 500 MG tablet Commonly known as: TYLENOL Take 1,000 mg by mouth every 6 (six)  hours as needed for moderate pain or headache.   apixaban 5 MG Tabs tablet Commonly known as: ELIQUIS Take 1 tablet (5 mg total) by mouth 2 (two) times daily.   folic acid 1 MG tablet Commonly known as: FOLVITE Take 1 tablet (1 mg total) by mouth daily.   ondansetron 4  MG tablet Commonly known as: ZOFRAN Take 1 tablet (4 mg total) by mouth every 6 (six) hours as needed for nausea.   pantoprazole 40 MG tablet Commonly known as: PROTONIX Take 1 tablet (40 mg total) by mouth 2 (two) times daily.   sucralfate 1 g tablet Commonly known as: CARAFATE Take 1 tablet (1 g total) by mouth 4 (four) times daily -  with meals and at bedtime.   thiamine 100 MG tablet Take 1 tablet (100 mg total) by mouth daily.      Follow-up Information    Zehr, Princella Pellegrini, PA-C Follow up on 05/12/2020.   Specialty: Gastroenterology Why: 1:30 pm Contact information: [address] [place] [address] [phone]        Grayce Sessions, NP Follow up.   Specialty: Internal Medicine Contact information: [address] [place] [address] [phone]          Allergies  Allergen Reactions  . Penicillins Rash    Did it involve swelling of the face/tongue/throat, SOB, or low BP? No Did it involve sudden or severe rash/hives, skin peeling, or any reaction on the inside of your mouth or nose? Yes Did you need to seek medical attention at a hospital or doctor's office? Yes When did it last happen?childhood allergy If all above answers are "NO", may proceed with cephalosporin use.     Consultations: gi  Procedures/Studies: CT ABDOMEN PELVIS W CONTRAST  Result Date: 04/09/2020 CLINICAL DATA:  Abdominal pain for 4 days. History of pancreatitis. EXAM: CT ABDOMEN AND PELVIS WITH CONTRAST TECHNIQUE: Multidetector CT imaging of the abdomen and pelvis was performed using the standard protocol following bolus administration of intravenous contrast. CONTRAST:  OMNIPAQUE IOHEXOL 300 MG/ML  SOLN COMPARISON:  CT scan 04/07/2020 FINDINGS: Lower chest: The lung bases are clear of acute process. No pleural effusion or pulmonary lesions. The heart is normal in size. No pericardial effusion. The distal esophagus and aorta are unremarkable. Hepatobiliary: No  focal hepatic lesions or intrahepatic biliary dilatation. The gallbladder is unremarkable. No common bile duct dilatation. Stable chronic thrombosis/occlusion of the middle and left portal veins with associated cavernous transformation. The right portal vein and main portal vein are patent. The splenic vein is patent. Pancreas: No mass or ductal dilatation. Largely resolved pancreatic inflammation. No pancreatic necrosis or pseudocysts. Spleen: Normal size. No focal lesions. Adrenals/Urinary Tract: Adrenal glands are normal. Both kidneys are normal. The bladder is normal. Stomach/Bowel: Severe inflammatory process involving the antropyloric region of the stomach with marked wall thickening, submucosal edema and mucosal enhancement. Findings likely representing severe gastritis or peptic ulcer disease without a discrete ulcer. I do not see any involvement of the descending duodenum. The small bowel and colon are unremarkable and stable. Vascular/Lymphatic: Stable age advanced atherosclerotic calcifications involving the distal aorta and iliac arteries. No aneurysm or dissection. The branch vessels are patent. No mesenteric or retroperitoneal mass or adenopathy. Small scattered upper abdominal lymph nodes are stable. Reproductive: The prostate gland and seminal vesicles are unremarkable. Other: No pelvic mass or adenopathy. No free pelvic fluid collections. No inguinal mass or adenopathy. No abdominal wall hernia or subcutaneous lesions. Musculoskeletal: No significant  bony findings. IMPRESSION: 1. Severe inflammatory process involving the antropyloric region of the stomach likely severe gastritis or peptic ulcer disease without a discrete ulcer. 2. Largely resolved pancreatic inflammation. 3. Stable chronic thrombosis/occlusion of the middle and left portal veins with associated cavernous transformation. 4. Stable age advanced atherosclerotic calcifications involving the distal aorta and iliac arteries. Aortic  Atherosclerosis (ICD10-I70.0). Electronically Signed   By: Rudie Meyer M.D.   On: 04/09/2020 06:24   CT Abdomen Pelvis W Contrast  Result Date: 04/07/2020 CLINICAL DATA:  Re-evaluate pancreatitis EXAM: CT ABDOMEN AND PELVIS WITH CONTRAST TECHNIQUE: Multidetector CT imaging of the abdomen and pelvis was performed using the standard protocol following bolus administration of intravenous contrast. CONTRAST:  OMNIPAQUE IOHEXOL 300 MG/ML SOLN, additional oral enteric contrast COMPARISON:  01/26/2020, 01/06/2020 FINDINGS: Lower chest: No acute abnormality. Hepatobiliary: Heterogeneous attenuation of the liver parenchyma, likely due to perfusion abnormality in the setting of portal vein thrombosis. No gallstones, gallbladder wall thickening, or biliary dilatation. Pancreas: Interval removal of a previously seen pancreatic ductal stent. No pancreatic ductal dilatation. There is inflammatory stranding about the pancreatic head (series 2, image 31). Spleen: Normal in size without significant abnormality. Adrenals/Urinary Tract: Adrenal glands are unremarkable. Kidneys are normal, without renal calculi, solid lesion, or hydronephrosis. Bladder is unremarkable. Stomach/Bowel: Thickening of the gastric antrum and pylorus (series 2, image 34). Status post appendectomy. No evidence of bowel wall thickening, distention, or inflammatory changes. Vascular/Lymphatic: Redemonstrated thrombosis of the left portal vein with partial cavernous transformation (series 2, image 23). Aortic atherosclerosis. Unchanged enlarged left retroperitoneal lymph nodes. Reproductive: No mass or other significant abnormality. Other: No abdominal wall hernia or abnormality. No abdominopelvic ascites. Musculoskeletal: No acute or significant osseous findings. IMPRESSION: 1. Interval removal of a previously seen pancreatic ductal stent. No pancreatic ductal dilatation. 2.  Unchanged inflammatory stranding about the pancreatic head. 3.  Redemonstrated thrombosis of the left portal vein with partial cavernous transformation. 4. Redemonstrated thickening of the gastric antrum and pylorus, which may be reactive to adjacent inflammation or indicative of infectious or inflammatory gastritis. 5. Unchanged enlarged left retroperitoneal lymph nodes, nonspecific. 6. Aortic atherosclerosis, advanced for patient age. Aortic Atherosclerosis (ICD10-I70.0). Electronically Signed   By: Lauralyn Primes M.D.   On: 04/07/2020 16:11   DG ABD ACUTE 2+V W 1V CHEST  Result Date: 04/09/2020 CLINICAL DATA:  Lower abdominal pain. Personal history of pancreatitis. EXAM: DG ABDOMEN ACUTE W/ 1V CHEST COMPARISON:  CT abdomen and pelvis with contrast 04/07/2020. Abdominal radiographs 01/08/2019 FINDINGS: The heart size is normal. Lungs are clear. No edema or effusion is present. Bowel gas pattern is normal. Oral contrast is present in the colon. Axial skeleton is within normal limits. IMPRESSION: Negative abdominal radiographs.  No acute cardiopulmonary disease. Electronically Signed   By: Marin Roberts M.D.   On: 04/09/2020 04:48    (Echo, Carotid, EGD, Colonoscopy, ERCP)    Subjective: Sitting in bed in no acute distress he was drinking coffee and standing up when I walked into the room he was able to tolerate a soft diet prior to discharge  Discharge Exam: Vitals:   04/10/20 0611 04/10/20 1303  BP: (!) 146/88 (!) 148/87  Pulse: (!) 50 (!) 51  Resp: 16   Temp: 98.4 F (36.9 C) 98.2 F (36.8 C)  SpO2: 99% 100%   Vitals:   04/09/20 1712 04/09/20 2149 04/10/20 0611 04/10/20 1303  BP: (!) 146/90 124/80 (!) 146/88 (!) 148/87  Pulse: (!) 56 (!) 55 (!) 50 (!) 51  Resp:  18 16   Temp: 98.1 F (36.7 C) 98.6 F (37 C) 98.4 F (36.9 C) 98.2 F (36.8 C)  TempSrc: Oral  Oral Oral  SpO2: 98% 100% 99% 100%  Weight:      Height:        General: Pt is alert, awake, not in acute distress Cardiovascular: RRR, S1/S2 +, no rubs, no  gallops Respiratory: CTA bilaterally, no wheezing, no rhonchi Abdominal: Soft, NT, ND, bowel sounds + Extremities: no edema, no cyanosis    The results of significant diagnostics from this hospitalization (including imaging, microbiology, ancillary and laboratory) are listed below for reference.     Microbiology: Recent Results (from the past 240 hour(s))  Respiratory Panel by RT PCR (Flu A&B, Covid) - Nasopharyngeal Swab     Status: None   Collection Time: 04/09/20  8:15 AM   Specimen: Nasopharyngeal Swab  Result Value Ref Range Status   SARS Coronavirus 2 by RT PCR NEGATIVE NEGATIVE Final    Comment: (NOTE) SARS-CoV-2 target nucleic acids are NOT DETECTED. The SARS-CoV-2 RNA is generally detectable in upper respiratoy specimens during the acute phase of infection. The lowest concentration of SARS-CoV-2 viral copies this assay can detect is 131 copies/mL. A negative result does not preclude SARS-Cov-2 infection and should not be used as the sole basis for treatment or other patient management decisions. A negative result may occur with  improper specimen collection/handling, submission of specimen other than nasopharyngeal swab, presence of viral mutation(s) within the areas targeted by this assay, and inadequate number of viral copies (<131 copies/mL). A negative result must be combined with clinical observations, patient history, and epidemiological information. The expected result is Negative. Fact Sheet for Patients:  https://www.moore.com/ Fact Sheet for Healthcare Providers:  https://www.young.biz/ This test is not yet ap proved or cleared by the [place] FDA and  has been authorized for detection and/or diagnosis of SARS-CoV-2 by FDA under an Emergency Use Authorization (EUA). This EUA will remain  in effect (meaning this test can be used) for the duration of the COVID-19 declaration under Section 564(b)(1) of the Act, 21  U.S.C. section 360bbb-3(b)(1), unless the authorization is terminated or revoked sooner.    Influenza A by PCR NEGATIVE NEGATIVE Final   Influenza B by PCR NEGATIVE NEGATIVE Final    Comment: (NOTE) The Xpert Xpress SARS-CoV-2/FLU/RSV assay is intended as an aid in  the diagnosis of influenza from Nasopharyngeal swab specimens and  should not be used as a sole basis for treatment. Nasal washings and  aspirates are unacceptable for Xpert Xpress SARS-CoV-2/FLU/RSV  testing. Fact Sheet for Patients: https://www.moore.com/ Fact Sheet for Healthcare Providers: https://www.young.biz/ This test is not yet approved or cleared by the [place] FDA and  has been authorized for detection and/or diagnosis of SARS-CoV-2 by  FDA under an Emergency Use Authorization (EUA). This EUA will remain  in effect (meaning this test can be used) for the duration of the  Covid-19 declaration under Section 564(b)(1) of the Act, 21  U.S.C. section 360bbb-3(b)(1), unless the authorization is  terminated or revoked. Performed at [facility] [address], [place], [place] [address]      Labs: BNP (last 3 results) No results for input(s): BNP in the last 8760 hours. Basic Metabolic Panel: Recent Labs  Lab 04/07/20 1106 04/09/20 0416 04/10/20 0413  NA  --  140 138  K  --  3.3* 3.5  CL  --  100 104  CO2  --  25 25  GLUCOSE  --  121* 99  BUN  --  16 10  CREATININE 0.60* 0.63 0.64  CALCIUM  --  9.8 8.9  MG  --   --  1.8   Liver Function Tests: Recent Labs  Lab 04/09/20 0416 04/10/20 0413  AST 38 30  ALT 45* 34  ALKPHOS 247* 180*  BILITOT 1.1 1.0  PROT 8.4* 6.5  ALBUMIN 4.8 4.0   Recent Labs  Lab 04/09/20 0416  LIPASE 50   No results for input(s): AMMONIA in the last 168 hours. CBC: Recent Labs  Lab 04/09/20 0416 04/10/20 0424  WBC 8.7 6.6  HGB 14.2 12.2*  HCT 42.4 36.5*  MCV 95.7 97.1  PLT 198 168   Cardiac  Enzymes: No results for input(s): CKTOTAL, CKMB, CKMBINDEX, TROPONINI in the last 168 hours. BNP: Invalid input(s): POCBNP CBG: No results for input(s): GLUCAP in the last 168 hours. D-Dimer No results for input(s): DDIMER in the last 72 hours. Hgb A1c No results for input(s): HGBA1C in the last 72 hours. Lipid Profile No results for input(s): CHOL, HDL, LDLCALC, TRIG, CHOLHDL, LDLDIRECT in the last 72 hours. Thyroid function studies No results for input(s): TSH, T4TOTAL, T3FREE, THYROIDAB in the last 72 hours.  Invalid input(s): FREET3 Anemia work up No results for input(s): VITAMINB12, FOLATE, FERRITIN, TIBC, IRON, RETICCTPCT in the last 72 hours. Urinalysis    Component Value Date/Time   COLORURINE AMBER (A) 04/09/2020 0507   APPEARANCEUR CLEAR 04/09/2020 0507   LABSPEC 1.030 04/09/2020 0507   PHURINE 5.0 04/09/2020 0507   GLUCOSEU NEGATIVE 04/09/2020 0507   HGBUR NEGATIVE 04/09/2020 0507   BILIRUBINUR NEGATIVE 04/09/2020 0507   KETONESUR 5 (A) 04/09/2020 0507   PROTEINUR 30 (A) 04/09/2020 0507   UROBILINOGEN 1.0 01/13/2008 2059   NITRITE NEGATIVE 04/09/2020 0507   LEUKOCYTESUR NEGATIVE 04/09/2020 0507   Sepsis Labs Invalid input(s): PROCALCITONIN,  WBC,  LACTICIDVEN Microbiology Recent Results (from the past 240 hour(s))  Respiratory Panel by RT PCR (Flu A&B, Covid) - Nasopharyngeal Swab     Status: None   Collection Time: 04/09/20  8:15 AM   Specimen: Nasopharyngeal Swab  Result Value Ref Range Status   SARS Coronavirus 2 by RT PCR NEGATIVE NEGATIVE Final    Comment: (NOTE) SARS-CoV-2 target nucleic acids are NOT DETECTED. The SARS-CoV-2 RNA is generally detectable in upper respiratoy specimens during the acute phase of infection. The lowest concentration of SARS-CoV-2 viral copies this assay can detect is 131 copies/mL. A negative result does not preclude SARS-Cov-2 infection and should not be used as the sole basis for treatment or other patient management  decisions. A negative result may occur with  improper specimen collection/handling, submission of specimen other than nasopharyngeal swab, presence of viral mutation(s) within the areas targeted by this assay, and inadequate number of viral copies (<131 copies/mL). A negative result must be combined with clinical observations, patient history, and epidemiological information. The expected result is Negative. Fact Sheet for Patients:  https://www.moore.com/ Fact Sheet for Healthcare Providers:  https://www.young.biz/ This test is not yet ap proved or cleared by the [place] FDA and  has been authorized for detection and/or diagnosis of SARS-CoV-2 by FDA under an Emergency Use Authorization (EUA). This EUA will remain  in effect (meaning this test can be used) for the duration of the COVID-19 declaration under Section 564(b)(1) of the Act, 21 U.S.C. section 360bbb-3(b)(1), unless the authorization is terminated or revoked sooner.    Influenza A by PCR NEGATIVE  NEGATIVE Final   Influenza B by PCR NEGATIVE NEGATIVE Final    Comment: (NOTE) The Xpert Xpress SARS-CoV-2/FLU/RSV assay is intended as an aid in  the diagnosis of influenza from Nasopharyngeal swab specimens and  should not be used as a sole basis for treatment. Nasal washings and  aspirates are unacceptable for Xpert Xpress SARS-CoV-2/FLU/RSV  testing. Fact Sheet for Patients: PinkCheek.be Fact Sheet for Healthcare Providers: GravelBags.it This test is not yet approved or cleared by the [place] FDA and  has been authorized for detection and/or diagnosis of SARS-CoV-2 by  FDA under an Emergency Use Authorization (EUA). This EUA will remain  in effect (meaning this test can be used) for the duration of the  Covid-19 declaration under Section 564(b)(1) of the Act, 21  U.S.C. section 360bbb-3(b)(1), unless the authorization  is  terminated or revoked. Performed at [facility], [place] [address], [place], [place] [address]      Time coordinating discharge:  39 minutes  SIGNED:   Georgette Shell, MD  [facility] 04/10/2020, 2:33 PM Pager   If 7PM-7AM, please contact night-coverage [email or web address] Password [email or web address]

## 2020-04-13 ENCOUNTER — Telehealth: Payer: Self-pay

## 2020-04-13 NOTE — Telephone Encounter (Signed)
Transition Care Management Follow-up Telephone Call Date of discharge and from where: 04/10/2020, [facility]   Call placed to patient # [phone], message left with call back requested to this CM .  Patient needs to schedule hospital follow up appointment at [facility]

## 2020-04-14 ENCOUNTER — Telehealth: Payer: Self-pay

## 2020-04-14 ENCOUNTER — Other Ambulatory Visit: Payer: Self-pay | Admitting: Gastroenterology

## 2020-04-14 MED ORDER — APIXABAN 5 MG PO TABS: 5.0000 mg | ORAL_TABLET | Freq: Two times a day (BID) | ORAL | 0 refills | Status: DC

## 2020-04-14 NOTE — Telephone Encounter (Signed)
Transition Care Management Follow-up Telephone Call Attempt # 2  Date of discharge and from where: 04/10/2020, [facility]   Call placed to patient # [phone], message left with call back requested to this CM .  Patient needs to schedule hospital follow up appointment at [facility]

## 2020-04-15 ENCOUNTER — Telehealth: Payer: Self-pay

## 2020-04-15 NOTE — Telephone Encounter (Signed)
Letter sent to patient requesting he contact [facility] to schedule follow up appointment

## 2020-04-20 ENCOUNTER — Telehealth: Payer: Self-pay

## 2020-04-20 NOTE — Telephone Encounter (Signed)
See alternate phone note regarding pt follow up

## 2020-04-20 NOTE — Telephone Encounter (Signed)
-----   Message from Loretha Stapler, RN sent at 01/20/2020 11:18 AM EST ----- Paul Allen,I think the thought of waiting until March 27 is not unreasonable however it is not clear to me that Dr. Myrtie Neither is managing the anticoagulation for this patient.  Dr. Maryfrances Bunnell was the inpatient attending and so it is not clear to me that his primary care doctor is managing the anticoagulation either.   I would move forward with scheduling the patient for a semiurgent hematology clinic visit to discuss timing of potential anticoagulation duration as well as potential for timing of interruption.Tentatively plan for patient to have ERCP at the end of March after the 63-month interval, but in the interim he can be seen by hematology to help guide things as much as possible.Thanks.GM

## 2020-04-21 ENCOUNTER — Other Ambulatory Visit: Payer: Self-pay

## 2020-04-21 DIAGNOSIS — Z7901 Long term (current) use of anticoagulants: Secondary | ICD-10-CM

## 2020-04-21 NOTE — Telephone Encounter (Signed)
This patient sent me a portal message today, and in chart review, happy to see there was a phone note from earlier this week regarding management of his Eliquis.  I knew that he been hospitalized after conversation with Dr. Meridee Score, and I recently refilled the Eliquis for a month.  I agree with Dr. Meridee Score that we are not accustomed to managing long-term anticoagulation, and I agree he needs a hematology evaluation.  Patty, please send that referral.  I will let the patient know about that referral in my room portal message reply to him.  - HD

## 2020-04-21 NOTE — Telephone Encounter (Signed)
Referral to hematology has been entered.

## 2020-04-30 ENCOUNTER — Other Ambulatory Visit: Payer: Self-pay

## 2020-04-30 ENCOUNTER — Telehealth (INDEPENDENT_AMBULATORY_CARE_PROVIDER_SITE_OTHER): Payer: 59 | Admitting: Primary Care

## 2020-04-30 ENCOUNTER — Encounter (INDEPENDENT_AMBULATORY_CARE_PROVIDER_SITE_OTHER): Payer: Self-pay | Admitting: Primary Care

## 2020-04-30 DIAGNOSIS — M109 Gout, unspecified: Secondary | ICD-10-CM | POA: Diagnosis not present

## 2020-04-30 MED ORDER — ALLOPURINOL 100 MG PO TABS: 100.0000 mg | ORAL_TABLET | Freq: Every day | ORAL | 1 refills | Status: DC

## 2020-04-30 NOTE — Progress Notes (Signed)
Virtual Visit via Telephone Note  I connected with Paul Allen on 04/30/20 at 10:50 AM EDT by telephone and verified that I am speaking with the correct person using two identifiers.   I discussed the limitations, risks, security and privacy concerns of performing an evaluation and management service by telephone and the availability of in person appointments. I also discussed with the patient that there may be a patient responsible charge related to this service. The patient expressed understanding and agreed to proceed.   History of Present Illness: Paul Allen is a 39 year old caucasian male having an acute visit trying to understand how and what causes Gout. He foot/toes right hurt so bad he presented to urgent care was diagnosised with gout and prescribed colchicine feels amazing actually hiking while we are talking, excercising enjoying out door activities. Past Medical History:  Diagnosis Date  . Abdominal pain   . Mesenteric vein thrombosis (HCC)    Dec 2020 in setting of pancreatitis  . Nausea & vomiting   . Pancreatitis    Current Outpatient Medications on File Prior to Visit  Medication Sig Dispense Refill  . apixaban (ELIQUIS) 5 MG TABS tablet Take 1 tablet (5 mg total) by mouth 2 (two) times daily. 60 tablet 0  . pantoprazole (PROTONIX) 40 MG tablet Take 1 tablet (40 mg total) by mouth 2 (two) times daily. 60 tablet 3  . acetaminophen (TYLENOL) 500 MG tablet Take 1,000 mg by mouth every 6 (six) hours as needed for moderate pain or headache.    . folic acid (FOLVITE) 1 MG tablet Take 1 tablet (1 mg total) by mouth daily. (Patient not taking: Reported on 04/30/2020)    . ondansetron (ZOFRAN) 4 MG tablet Take 1 tablet (4 mg total) by mouth every 6 (six) hours as needed for nausea. (Patient not taking: Reported on 04/30/2020) 20 tablet 0  . sucralfate (CARAFATE) 1 g tablet Take 1 tablet (1 g total) by mouth 4 (four) times daily -  with meals and at bedtime. (Patient not  taking: Reported on 04/30/2020) 120 tablet 2  . thiamine 100 MG tablet Take 1 tablet (100 mg total) by mouth daily. (Patient not taking: Reported on 04/30/2020)     Current Facility-Administered Medications on File Prior to Visit  Medication Dose Route Frequency Provider Last Rate Last Admin  . nicotine polacrilex (NICORETTE) gum 2 mg  2 mg Oral PRN Grayce Sessions, NP        Observations/Objective:   Assessment and Plan: Diagnoses and all orders for this visit:  Gout, unspecified cause, unspecified chronicity, unspecified site Gout is an inflammatory arthritis related to a hyperuricemia.  Risk factors greater than 40, male gender, increase in purines red meats and seafoods, alcohol smoking, and obesity greater than 30   allopurinol (ZYLOPRIM) 100 MG tablet; Take 1 tablet (100 mg total) by mouth daily.   Follow Up Instructions:    I discussed the assessment and treatment plan with the patient. The patient was provided an opportunity to ask questions and all were answered. The patient agreed with the plan and demonstrated an understanding of the instructions.   The patient was advised to call back or seek an in-person evaluation if the symptoms worsen or if the condition fails to improve as anticipated.  I provided 10 minutes of non-face-to-face time during this encounter.   Grayce Sessions, NP

## 2020-04-30 NOTE — Progress Notes (Signed)
Per pt he do not have any abd pain. Per pt he feels great and feels the best he have in a long time. Per pt this sx was about 1 month ago.   Per pt he have the case of gout but the swelling is going down but it is an off and on situation.   Per pt he just want to talk about the Gout and wanting to know how to better manage it. Per pt he is on low sodium diet

## 2020-05-12 ENCOUNTER — Encounter: Payer: Self-pay | Admitting: Gastroenterology

## 2020-05-12 ENCOUNTER — Ambulatory Visit (INDEPENDENT_AMBULATORY_CARE_PROVIDER_SITE_OTHER): Payer: 59 | Admitting: Gastroenterology

## 2020-05-12 VITALS — BP 138/82 | HR 79 | Ht 69.0 in | Wt 160.0 lb

## 2020-05-12 DIAGNOSIS — K311 Adult hypertrophic pyloric stenosis: Secondary | ICD-10-CM | POA: Diagnosis not present

## 2020-05-12 DIAGNOSIS — K55069 Acute infarction of intestine, part and extent unspecified: Secondary | ICD-10-CM | POA: Diagnosis not present

## 2020-05-12 DIAGNOSIS — K8521 Alcohol induced acute pancreatitis with uninfected necrosis: Secondary | ICD-10-CM | POA: Diagnosis not present

## 2020-05-12 DIAGNOSIS — K2970 Gastritis, unspecified, without bleeding: Secondary | ICD-10-CM

## 2020-05-12 NOTE — Progress Notes (Signed)
05/12/2020 Paul Allen [id number] [birth year]   HISTORY OF PRESENT ILLNESS: This is a 39 year old male who is a patient of Dr. Corena Pilgrim.  He is here for hospital follow-up.  Please see my consult note from April 09, 2020 that outlines his history.  Nonetheless, he was hospitalized for about 24 hours on that occasion for complaints of nausea, vomiting, abdominal pain.  CT scan showed severe inflammatory process involving the antropyloric region of the stomach likely severe gastritis or peptic ulcer disease without a discrete ulcer.  He was treated with twice daily PPI therapy and Carafate and symptoms resolved/improved quickly.  He says that since that time he is continue twice daily PPI and Carafate ACHS.  He says that he feels great, best that he has felt in years.  He remains on the Eliquis.  Still has not seen hematology.   Past Medical History:  Diagnosis Date  . Abdominal pain   . Mesenteric vein thrombosis ([place])    Dec 2020 in setting of pancreatitis  . Nausea & vomiting   . Pancreatitis    Past Surgical History:  Procedure Laterality Date  . APPENDECTOMY    . BIOPSY  01/29/2020   Procedure: BIOPSY;  Surgeon: Milus Banister, MD;  Location: [facility];  Service: Gastroenterology;;  . BIOPSY  03/17/2020   Procedure: BIOPSY;  Surgeon: Irving Copas., MD;  Location: [facility];  Service: Gastroenterology;;  . ERCP N/A 12/06/2019   Procedure: ENDOSCOPIC RETROGRADE CHOLANGIOPANCREATOGRAPHY (ERCP);  Surgeon: Milus Banister, MD;  Location: [facility];  Service: Endoscopy;  Laterality: N/A;  Aborted Procedure  . ERCP N/A 12/11/2019   Procedure: ENDOSCOPIC RETROGRADE CHOLANGIOPANCREATOGRAPHY (ERCP);  Surgeon: Irving Copas., MD;  Location: [facility];  Service: Gastroenterology;  Laterality: N/A;  . ESOPHAGOGASTRODUODENOSCOPY (EGD) WITH PROPOFOL N/A 01/29/2020   Procedure: ESOPHAGOGASTRODUODENOSCOPY (EGD) WITH PROPOFOL;  Surgeon: Milus Banister, MD;   Location: [facility];  Service: Gastroenterology;  Laterality: N/A;  . ESOPHAGOGASTRODUODENOSCOPY (EGD) WITH PROPOFOL N/A 03/17/2020   Procedure: ESOPHAGOGASTRODUODENOSCOPY (EGD) WITH PROPOFOL;  Surgeon: Rush Landmark Telford Nab., MD;  Location: [facility];  Service: Gastroenterology;  Laterality: N/A;  . PANCREATIC STENT PLACEMENT  12/11/2019   Procedure: PANCREATIC STENT PLACEMENT;  Surgeon: Irving Copas., MD;  Location: [facility];  Service: Gastroenterology;;  . Joan Mayans  12/11/2019   Procedure: Joan Mayans;  Surgeon: Irving Copas., MD;  Location: [facility];  Service: Gastroenterology;;  . Lavell Islam REMOVAL  03/17/2020   Procedure: STENT REMOVAL;  Surgeon: Irving Copas., MD;  Location: [facility];  Service: Gastroenterology;;    reports that he quit smoking about 5 months ago. His smoking use included cigarettes. He smoked 1.50 packs per day. He has never used smokeless tobacco. He reports previous alcohol use. He reports current drug use. Frequency: 7.00 times per week. Drug: Marijuana. family history includes Arthritis in his mother; Cholecystitis in his mother; Stomach cancer in his father. Allergies  Allergen Reactions  . Penicillins Rash    Did it involve swelling of the face/tongue/throat, SOB, or low BP? No Did it involve sudden or severe rash/hives, skin peeling, or any reaction on the inside of your mouth or nose? Yes Did you need to seek medical attention at a hospital or doctor's office? Yes When did it last happen?childhood allergy If all above answers are "NO", may proceed with cephalosporin use.       Outpatient Encounter Medications as of 05/12/2020  Medication Sig  . acetaminophen (TYLENOL) 500 MG tablet Take 1,000  mg by mouth every 6 (six) hours as needed for moderate pain or headache.  . allopurinol (ZYLOPRIM) 100 MG tablet Take 1 tablet (100 mg total) by mouth daily.  Marland Kitchen apixaban (ELIQUIS) 5 MG TABS tablet Take 1 tablet  (5 mg total) by mouth 2 (two) times daily.  . folic acid (FOLVITE) 1 MG tablet Take 1 tablet (1 mg total) by mouth daily.  . ondansetron (ZOFRAN) 4 MG tablet Take 1 tablet (4 mg total) by mouth every 6 (six) hours as needed for nausea.  . sucralfate (CARAFATE) 1 g tablet Take 1 tablet (1 g total) by mouth 4 (four) times daily -  with meals and at bedtime.  . thiamine 100 MG tablet Take 1 tablet (100 mg total) by mouth daily.  . pantoprazole (PROTONIX) 40 MG tablet Take 1 tablet (40 mg total) by mouth 2 (two) times daily.   Facility-Administered Encounter Medications as of 05/12/2020  Medication  . nicotine polacrilex (NICORETTE) gum 2 mg     REVIEW OF SYSTEMS  : All other systems reviewed and negative except where noted in the History of Present Illness.   PHYSICAL EXAM: BP 138/82   Pulse 79   Ht 5\' 9"  (1.753 m)   Wt 160 lb (72.6 kg)   SpO2 97%   BMI 23.63 kg/m  General: Well developed white male in no acute distress Head: Normocephalic and atraumatic Eyes:  Sclerae anicteric, conjunctiva pink. Ears: Normal auditory acuity Lungs: Clear throughout to auscultation; no increased WOB. Heart: Regular rate and rhythm; no M/R/G. Abdomen: Soft, non-distended.  BS present.  Non-tender. Musculoskeletal: Symmetrical with no gross deformities  Skin: No lesions on visible extremities Extremities: No edema  Neurological: Alert oriented x 4, grossly non-focal Psychological:  Alert and cooperative. Normal mood and affect  ASSESSMENT AND PLAN: *Recent nausea, vomiting, and upper abdominal pain with CT scan showing severe inflammation in the antropyloric region of the stomach, ? Severe gastritis vs PUD with no discrete ulcer seen. Symptoms resolved with PPI and carafate/supportive are GOO  No further issues since hospitalization last month. *Chronic SMV thrombosis: On Eliquis.  Still has not been seen by hematology.  New referral has been placed. *Previous gastric outlet obstruction from  severe pyloricand proximal duodenal inflammation thought to be secondary to residual effects of severe pancreatitis  *Previous PD duct leak with PD stent placed 11/2019 and then removed on 03/17/2020 *History of severe ETOH pancreatitis in 2020  -Continue pantoprazole 40 mg BID. -Continue carafate ACHS for another 2-4 weeks then can slowly decrease to three times daily, twice daily, once daily, then discontinue. -No NSAID's or ETOH (has not been taking them, but continue to avoid). -Will see hematology about Eliquis.   CC:  2021, NP

## 2020-05-12 NOTE — Patient Instructions (Addendum)
If you are age 40 or older, your body mass index should be between 23-30. Your Body mass index is 23.63 kg/m. If this is out of the aforementioned range listed, please consider follow up with your Primary Care Provider.  If you are age 58 or younger, your body mass index should be between 19-25. Your Body mass index is 23.63 kg/m. If this is out of the aformentioned range listed, please consider follow up with your Primary Care Provider.   Continue Carafate for another 2-4 weeks at current dose then gradually decrease.   Follow up in 6 months with Dr. Tawn[facility] - [phone]

## 2020-05-13 ENCOUNTER — Telehealth: Payer: Self-pay | Admitting: Oncology

## 2020-05-13 NOTE — Telephone Encounter (Signed)
Received a new hem referral from [facility] for Superior mesenteric vein thrombosis. Paul Allen has been cld and scheduled to see Dr. Clelia Croft on 6/4 at 11am. Pt aware to arrive 15 minutes early.

## 2020-05-13 NOTE — Progress Notes (Signed)
____________________________________________________________  Attending physician addendum:  Thank you for sending this case to me. I have reviewed the entire note, and the outlined plan seems appropriate.  Not clear why he has had severe persistent gastritis, though Dr. Meridee Score and I believe it must be some residual effect of the pancreatitis. Perhaps the portal vein thrombosis may be causing localized distal gastric edema.  He definitely needs hematology to follow him and manage the [facility].  Amada Jupiter, MD  ____________________________________________________________

## 2020-05-19 ENCOUNTER — Telehealth (INDEPENDENT_AMBULATORY_CARE_PROVIDER_SITE_OTHER): Payer: 59 | Admitting: Primary Care

## 2020-05-22 ENCOUNTER — Inpatient Hospital Stay: Payer: 59 | Attending: Oncology | Admitting: Oncology

## 2020-05-22 ENCOUNTER — Encounter: Payer: Self-pay | Admitting: Oncology

## 2020-05-22 ENCOUNTER — Other Ambulatory Visit: Payer: Self-pay

## 2020-05-22 VITALS — BP 136/83 | HR 74 | Temp 97.7°F | Resp 18 | Ht 69.0 in | Wt 158.2 lb

## 2020-05-22 DIAGNOSIS — K860 Alcohol-induced chronic pancreatitis: Secondary | ICD-10-CM

## 2020-05-22 DIAGNOSIS — Z87891 Personal history of nicotine dependence: Secondary | ICD-10-CM

## 2020-05-22 DIAGNOSIS — F17210 Nicotine dependence, cigarettes, uncomplicated: Secondary | ICD-10-CM | POA: Insufficient documentation

## 2020-05-22 DIAGNOSIS — K2970 Gastritis, unspecified, without bleeding: Secondary | ICD-10-CM

## 2020-05-22 DIAGNOSIS — I81 Portal vein thrombosis: Secondary | ICD-10-CM

## 2020-05-22 DIAGNOSIS — F1210 Cannabis abuse, uncomplicated: Secondary | ICD-10-CM

## 2020-05-22 DIAGNOSIS — Z800 Family history of malignant neoplasm of digestive organs: Secondary | ICD-10-CM

## 2020-05-22 DIAGNOSIS — Z86718 Personal history of other venous thrombosis and embolism: Secondary | ICD-10-CM

## 2020-05-22 NOTE — Progress Notes (Signed)
Reason for the request: Mesenteric vein thrombosis.  HPI: I was asked by Dr. Myrtie Neither  to evaluate Paul Allen for evaluation of follow-up of mesenteric vein thrombosis.  He is a 39 year old man with history of recurrent pancreatitis who presented with intermittent epigastric abdominal pain and nausea and vomiting in December 2020.  During that evaluation he was found to have nonocclusive thrombus of the main portal vein and near occlusive thrombus within the left portal vein as well as the superior mesenteric vein.  This was diagnosed in the setting of recurrent pancreatitis and acute illness.  He was started on Eliquis for presumably 3 months although was kept on it for the time being.  He had multiple hospitalization for similar episodes including nausea vomiting and pancreatitis and had repeat imaging studies including CT scan of the abdomen pelvis on April 09, 2020.  The scan showed severe inflammatory process involving the anteropyloric region of the stomach related to gastritis.  Stable chronic thrombosis and occlusion of the middle of the left portal vein with associated cavernous transformation.   Clinically, he feels well at this time without any recent complaints.  He denies any abdominal pain, diarrhea or discomfort.  He denies any hematochezia or melena.  He does report excessive bleeding after minor injuries.   He does not report any headaches, blurry vision, syncope or seizures. Does not report any fevers, chills or sweats.  Does not report any cough, wheezing or hemoptysis.  Does not report any chest pain, palpitation, orthopnea or leg edema.  Does not report any nausea, vomiting or abdominal pain.  Does not report any constipation or diarrhea.  Does not report any skeletal complaints.    Does not report frequency, urgency or hematuria.  Does not report any skin rashes or lesions. Does not report any heat or cold intolerance.  Does not report any lymphadenopathy or petechiae.  Does not report  any anxiety or depression.  Remaining review of systems is negative.    Past Medical History:  Diagnosis Date  . Abdominal pain   . Mesenteric vein thrombosis (HCC)    Dec 2020 in setting of pancreatitis  . Nausea & vomiting   . Pancreatitis   :  Past Surgical History:  Procedure Laterality Date  . APPENDECTOMY    . BIOPSY  01/29/2020   Procedure: BIOPSY;  Surgeon: Rachael Fee, MD;  Location: [facility];  Service: Gastroenterology;;  . BIOPSY  03/17/2020   Procedure: BIOPSY;  Surgeon: Lemar Lofty., MD;  Location: [facility];  Service: Gastroenterology;;  . ERCP N/A 12/06/2019   Procedure: ENDOSCOPIC RETROGRADE CHOLANGIOPANCREATOGRAPHY (ERCP);  Surgeon: Rachael Fee, MD;  Location: [facility];  Service: Endoscopy;  Laterality: N/A;  Aborted Procedure  . ERCP N/A 12/11/2019   Procedure: ENDOSCOPIC RETROGRADE CHOLANGIOPANCREATOGRAPHY (ERCP);  Surgeon: Lemar Lofty., MD;  Location: [facility];  Service: Gastroenterology;  Laterality: N/A;  . ESOPHAGOGASTRODUODENOSCOPY (EGD) WITH PROPOFOL N/A 01/29/2020   Procedure: ESOPHAGOGASTRODUODENOSCOPY (EGD) WITH PROPOFOL;  Surgeon: Rachael Fee, MD;  Location: [facility];  Service: Gastroenterology;  Laterality: N/A;  . ESOPHAGOGASTRODUODENOSCOPY (EGD) WITH PROPOFOL N/A 03/17/2020   Procedure: ESOPHAGOGASTRODUODENOSCOPY (EGD) WITH PROPOFOL;  Surgeon: Meridee Score Netty Starring., MD;  Location: [facility];  Service: Gastroenterology;  Laterality: N/A;  . PANCREATIC STENT PLACEMENT  12/11/2019   Procedure: PANCREATIC STENT PLACEMENT;  Surgeon: Lemar Lofty., MD;  Location: [facility];  Service: Gastroenterology;;  . Dennison Mascot  12/11/2019   Procedure: Dennison Mascot;  Surgeon: Lemar Lofty., MD;  Location: [facility];  Service: Gastroenterology;;  . Francine Graven REMOVAL  03/17/2020   Procedure: STENT REMOVAL;  Surgeon: Lemar Lofty., MD;  Location: [facility];  Service:  Gastroenterology;;  :   Current Outpatient Medications:  .  acetaminophen (TYLENOL) 500 MG tablet, Take 1,000 mg by mouth every 6 (six) hours as needed for moderate pain or headache., Disp: , Rfl:  .  allopurinol (ZYLOPRIM) 100 MG tablet, Take 1 tablet (100 mg total) by mouth daily., Disp: 90 tablet, Rfl: 1 .  apixaban (ELIQUIS) 5 MG TABS tablet, Take 1 tablet (5 mg total) by mouth 2 (two) times daily., Disp: 60 tablet, Rfl: 0 .  folic acid (FOLVITE) 1 MG tablet, Take 1 tablet (1 mg total) by mouth daily., Disp: , Rfl:  .  ondansetron (ZOFRAN) 4 MG tablet, Take 1 tablet (4 mg total) by mouth every 6 (six) hours as needed for nausea., Disp: 20 tablet, Rfl: 0 .  pantoprazole (PROTONIX) 40 MG tablet, Take 1 tablet (40 mg total) by mouth 2 (two) times daily., Disp: 60 tablet, Rfl: 3 .  sucralfate (CARAFATE) 1 g tablet, Take 1 tablet (1 g total) by mouth 4 (four) times daily -  with meals and at bedtime., Disp: 120 tablet, Rfl: 2 .  thiamine 100 MG tablet, Take 1 tablet (100 mg total) by mouth daily., Disp: , Rfl:   Current Facility-Administered Medications:  .  nicotine polacrilex (NICORETTE) gum 2 mg, 2 mg, Oral, PRN, Grayce Sessions, NP:  Allergies  Allergen Reactions  . Penicillins Rash    Did it involve swelling of the face/tongue/throat, SOB, or low BP? No Did it involve sudden or severe rash/hives, skin peeling, or any reaction on the inside of your mouth or nose? Yes Did you need to seek medical attention at a hospital or doctor's office? Yes When did it last happen?childhood allergy If all above answers are "NO", may proceed with cephalosporin use.   :  Family History  Problem Relation Age of Onset  . Cholecystitis Mother   . Arthritis Mother   . Stomach cancer Father   :  Social History   Socioeconomic History  . Marital status: Single    Spouse name: Not on file  . Number of children: Not on file  . Years of education: Not on file  . Highest education level:  Not on file  Occupational History  . Not on file  Tobacco Use  . Smoking status: Former Smoker    Packs/day: 1.50    Types: Cigarettes    Quit date: 11/19/2019    Years since quitting: 0.5  . Smokeless tobacco: Never Used  Substance and Sexual Activity  . Alcohol use: Not Currently    Comment: quit six months ago  . Drug use: Yes    Frequency: 7.0 times per week    Types: Marijuana  . Sexual activity: Yes  Other Topics Concern  . Not on file  Social History Narrative   Single no kids   Uninsured   Smoker - tobacco and marijuana   Alcohol - hx - stopped last time in 11/2019 w/ pancreatitis   Social Determinants of Health   Financial Resource Strain:   . Difficulty of Paying Living Expenses:   Food Insecurity:   . Worried About Programme researcher, broadcasting/film/video in the Last Year:   . Barista in the Last Year:   Transportation Needs:   . Freight forwarder (Medical):   Marland Kitchen Lack of Transportation (Non-Medical):  Physical Activity:   . Days of Exercise per Week:   . Minutes of Exercise per Session:   Stress:   . Feeling of Stress :   Social Connections:   . Frequency of Communication with Friends and Family:   . Frequency of Social Gatherings with Friends and Family:   . Attends Religious Services:   . Active Member of Clubs or Organizations:   . Attends Archivist Meetings:   Marland Kitchen Marital Status:   Intimate Partner Violence:   . Fear of Current or Ex-Partner:   . Emotionally Abused:   Marland Kitchen Physically Abused:   . Sexually Abused:   :  Pertinent items are noted in HPI.  Exam: Blood pressure 136/83, pulse 74, temperature 97.7 F (36.5 C), temperature source Temporal, resp. rate 18, height 5\' 9"  (1.753 m), weight 158 lb 3.2 oz (71.8 kg), SpO2 100 %.  ECOG 0  General appearance: alert and cooperative appeared without distress. Head: atraumatic without any abnormalities. Eyes: conjunctivae/corneas clear. PERRL.  Sclera anicteric. Throat: lips, mucosa, and tongue  normal; without oral thrush or ulcers. Resp: clear to auscultation bilaterally without rhonchi, wheezes or dullness to percussion. Cardio: regular rate and rhythm, S1, S2 normal, no murmur, click, rub or gallop GI: soft, non-tender; bowel sounds normal; no masses,  no organomegaly Skin: Skin color, texture, turgor normal. No rashes or lesions Lymph nodes: Cervical, supraclavicular, and axillary nodes normal. Neurologic: Grossly normal without any motor, sensory or deep tendon reflexes. Musculoskeletal: No joint deformity or effusion.    Assessment and Plan:    40 year old man with:  1.  Mesenteric vein thrombosis with involvement of the middle and left portal vein diagnosed in December 2020.  This was diagnosed in the setting of acute pancreatitis and then received a full dose anticoagulation with Eliquis.  He has completed close to 6 months of anticoagulation at this time.  Repeat CT scan in April 2020 showed chronic thrombus with cavernous transformation indicating chronicity of this thrombus.  The natural course of these findings were discussed with the patient as well as the duration of anticoagulation was reviewed.  It appears that his thrombosis has been provoked by recurrent pancreatitis which in turn caused his mesenteric vein thrombosis.  He has had recurrent acute episodes of pancreatitis dating back to 2013 and probably before that.  I see no evidence to suggest inherited or acquired thrombophilia to be the main cause of this thrombosis.  The duration of anticoagulation was debated this time I have recommended discontinuation of his anticoagulation given the cavernous transformation and chronicity of his thrombosis.  After discussion today, he is agreeable to stop anticoagulation given the risk of long-term anticoagulation outweighs the potential benefit at this time.  He also understands of recurrent thrombosis does occur in the future, longer anticoagulation may be needed in a  hypercoagulable panel might be required.  2.  Recurrent pancreatitis and gastritis: This has probably resulted in his thrombosis and not the other way around.  I do not think there is recurrent gastritis is caused by his previous thrombosis.  Continues to follow with gastroenterology regarding this issue.  3.  Follow-up: I am happy to see him in the future as needed.  45  minutes were dedicated to this visit. The time was spent on reviewing imaging studies, discussing treatment options, discussing differential diagnosis and answering questions regarding future plan.      A copy of this consult has been forwarded to the requesting physician.

## 2020-05-26 ENCOUNTER — Telehealth (INDEPENDENT_AMBULATORY_CARE_PROVIDER_SITE_OTHER): Payer: 59 | Admitting: Primary Care

## 2020-05-26 DIAGNOSIS — M109 Gout, unspecified: Secondary | ICD-10-CM

## 2020-05-26 MED ORDER — ALLOPURINOL 100 MG PO TABS: 100.0000 mg | ORAL_TABLET | Freq: Every day | ORAL | 3 refills | Status: AC

## 2020-05-26 NOTE — Progress Notes (Addendum)
Virtual Visit via Telephone Note  I connected with Paul Allen on 05/26/20 at  1:30 PM EDT by telephone and verified that I am speaking with the correct person using two identifiers.   I discussed the limitations, risks, security and privacy concerns of performing an evaluation and management service by telephone and the availability of in person appointments. I also discussed with the patient that there may be a patient responsible charge related to this service. The patient expressed understanding and agreed to proceed. Patient location home [name], NP location [facility]  History of Present Illness: Mr. Paul Allen is a 39 year old male having a tele visit after a gout flare up and started on Allopurinol no other problems and change diet. He voiced concerns about blood pressure ranges from systolic 118-150 and systolic 50-93, Isolated Bp 160/87. Explained diagnosis of HTN not a problem at this time but continue to monitor is ranges 160/90 on several different day in a week call to schedule appointment. He was very please to share release from GI and oncology. Past Medical History:  Diagnosis Date  . Abdominal pain   . Mesenteric vein thrombosis (HCC)    Dec 2020 in setting of pancreatitis  . Nausea & vomiting   . Pancreatitis    Current Outpatient Medications on File Prior to Visit  Medication Sig Dispense Refill  . acetaminophen (TYLENOL) 500 MG tablet Take 1,000 mg by mouth every 6 (six) hours as needed for moderate pain or headache.    . amphetamine-dextroamphetamine (ADDERALL) 10 MG tablet Take 10 mg by mouth 2 (two) times daily.    Marland Kitchen apixaban (ELIQUIS) 5 MG TABS tablet Take 1 tablet (5 mg total) by mouth 2 (two) times daily. 60 tablet 0  . folic acid (FOLVITE) 1 MG tablet Take 1 tablet (1 mg total) by mouth daily.    . ondansetron (ZOFRAN) 4 MG tablet Take 1 tablet (4 mg total) by mouth every 6 (six) hours as needed for nausea. (Patient not taking:  Reported on 05/22/2020) 20 tablet 0  . pantoprazole (PROTONIX) 40 MG tablet Take 1 tablet (40 mg total) by mouth 2 (two) times daily. 60 tablet 3  . sucralfate (CARAFATE) 1 g tablet Take 1 tablet (1 g total) by mouth 4 (four) times daily -  with meals and at bedtime. 120 tablet 2  . thiamine 100 MG tablet Take 1 tablet (100 mg total) by mouth daily.     Current Facility-Administered Medications on File Prior to Visit  Medication Dose Route Frequency Provider Last Rate Last Admin  . nicotine polacrilex (NICORETTE) gum 2 mg  2 mg Oral PRN Grayce Sessions, NP       Observations/Objective: Review of Systems  All other systems reviewed and are negative.  Assessment and Plan: Diagnoses and all orders for this visit:  Gout, unspecified cause, unspecified chronicity, unspecified site Controlled on medication .  -     allopurinol (ZYLOPRIM) 100 MG tablet; Take 1 tablet (100 mg total) by mouth daily.    Follow Up Instructions:    I discussed the assessment and treatment plan with the patient. The patient was provided an opportunity to ask questions and all were answered. The patient agreed with the plan and demonstrated an understanding of the instructions.   The patient was advised to call back or seek an in-person evaluation if the symptoms worsen or if the condition fails to improve as anticipated.  I provided 8 minutes of non-face-to-face  time during this encounter.   Kerin Perna, NP

## 2020-06-09 ENCOUNTER — Telehealth: Payer: Self-pay | Admitting: Gastroenterology

## 2020-06-09 NOTE — Telephone Encounter (Signed)
Spoke with patient, he stated that he is already scheduled for the MRI at [facility] on 06/25/20 at 9:30 am.

## 2020-06-09 NOTE — Telephone Encounter (Signed)
Left message for patient to return call.

## 2020-06-09 NOTE — Telephone Encounter (Signed)
(  This is a patient with severe pancreatitis last year and some complications since then, slowly improving in recent months.)  I rec'd and reviewed the consult note from [facility] (a consultation which Mr. Quinter requested).  It recommends an MRI abdomen and MRCP to evaluate for pancreatic duct stricture.  It is unclear from the note whether the [facility] provider planned to order the MRI or if they wanted me to do it. Please check with the patient on this.  If not already scheduled with [facility], and since all other imaging is in our hospital system, please schedule an:  MRI abdomen and MRCP for History of pancreatitis, rule out pancreatic duct stricture.  Thank you.  - H. Myrtie Neither, MD

## 2020-08-21 ENCOUNTER — Telehealth: Payer: Self-pay | Admitting: Oncology

## 2020-08-21 NOTE — Telephone Encounter (Signed)
Scheduled per 9/2 staff msg. Called and spoke with pt, confirmed 10/6 appt

## 2020-09-23 ENCOUNTER — Inpatient Hospital Stay: Payer: 59 | Attending: Oncology | Admitting: Oncology

## 2020-09-23 ENCOUNTER — Other Ambulatory Visit: Payer: Self-pay

## 2020-09-23 VITALS — BP 121/81 | HR 84 | Temp 97.6°F | Resp 18 | Wt 158.2 lb

## 2020-09-23 DIAGNOSIS — K860 Alcohol-induced chronic pancreatitis: Secondary | ICD-10-CM

## 2020-09-23 DIAGNOSIS — Z7901 Long term (current) use of anticoagulants: Secondary | ICD-10-CM | POA: Insufficient documentation

## 2020-09-23 DIAGNOSIS — I81 Portal vein thrombosis: Secondary | ICD-10-CM | POA: Insufficient documentation

## 2020-09-23 DIAGNOSIS — Z86718 Personal history of other venous thrombosis and embolism: Secondary | ICD-10-CM | POA: Diagnosis not present

## 2020-09-23 NOTE — Progress Notes (Signed)
Hematology and Oncology Follow Up Visit  Paul Allen [id number] [birth year] 38 y.o. 09/23/2020 3:48 PM Paul Allen, NPEdwards, Paul Scales, NP   Principle Diagnosis: 39 year old with a portal vein thrombosis diagnosed in December 2020.  This was detected in the setting of acute pancreatitis.   Prior Therapy: He completed 6 months of anticoagulation of Eliquis in June 2021.  Current therapy: Active surveillance.  Interim History: Paul Allen returns today for a follow-up visit.  Since the last visit, he was seen by gastroenterology at [facility] and had a repeat MRI of the abdomen showed a cavernous transformation of the portal vein with chronic left portal vein thrombus.  Clinically, he reports no complaints at this time.  He denies any abdominal pain, shortness of breath or difficulty breathing.  He denies any recent thrombosis or bleeding episodes.     Medications: I have reviewed the patient's current medications.  Current Outpatient Medications  Medication Sig Dispense Refill  . acetaminophen (TYLENOL) 500 MG tablet Take 1,000 mg by mouth every 6 (six) hours as needed for moderate pain or headache.    . allopurinol (ZYLOPRIM) 100 MG tablet Take 1 tablet (100 mg total) by mouth daily. 90 tablet 3  . amphetamine-dextroamphetamine (ADDERALL) 10 MG tablet Take 10 mg by mouth 2 (two) times daily.    Marland Kitchen apixaban (ELIQUIS) 5 MG TABS tablet Take 1 tablet (5 mg total) by mouth 2 (two) times daily. 60 tablet 0  . folic acid (FOLVITE) 1 MG tablet Take 1 tablet (1 mg total) by mouth daily.    . ondansetron (ZOFRAN) 4 MG tablet Take 1 tablet (4 mg total) by mouth every 6 (six) hours as needed for nausea. (Patient not taking: Reported on 05/22/2020) 20 tablet 0  . pantoprazole (PROTONIX) 40 MG tablet Take 1 tablet (40 mg total) by mouth 2 (two) times daily. 60 tablet 3  . sucralfate (CARAFATE) 1 g tablet Take 1 tablet (1 g total) by mouth 4 (four) times daily -  with  meals and at bedtime. 120 tablet 2  . thiamine 100 MG tablet Take 1 tablet (100 mg total) by mouth daily.     Current Facility-Administered Medications  Medication Dose Route Frequency Provider Last Rate Last Admin  . nicotine polacrilex (NICORETTE) gum 2 mg  2 mg Oral PRN Paul Sessions, NP         Allergies:  Allergies  Allergen Reactions  . Penicillins Rash    Did it involve swelling of the face/tongue/throat, SOB, or low BP? No Did it involve sudden or severe rash/hives, skin peeling, or any reaction on the inside of your mouth or nose? Yes Did you need to seek medical attention at a hospital or doctor's office? Yes When did it last happen?childhood allergy If all above answers are "NO", may proceed with cephalosporin use.       Physical Exam: Blood pressure 121/81, pulse 84, temperature 97.6 F (36.4 C), temperature source Tympanic, resp. rate 18, weight 158 lb 3.2 oz (71.8 kg), SpO2 99 %.   ECOG: 0   General appearance: Comfortable appearing without any discomfort Head: Normocephalic without any trauma Oropharynx: Mucous membranes are moist and pink without any thrush or ulcers. Eyes: Pupils are equal and round reactive to light. Lymph nodes: No cervical, supraclavicular, inguinal or axillary lymphadenopathy.   Heart:regular rate and rhythm.  S1 and S2 without leg edema. Lung: Clear without any rhonchi or wheezes.  No dullness to percussion. Abdomin: Soft, nontender, nondistended  with good bowel sounds.  No hepatosplenomegaly. Musculoskeletal: No joint deformity or effusion.  Full range of motion noted. Neurological: No deficits noted on motor, sensory and deep tendon reflex exam. Skin: No petechial rash or dryness.  Appeared moist.      Lab Results: Lab Results  Component Value Date   WBC 6.6 04/10/2020   HGB 12.2 (L) 04/10/2020   HCT 36.5 (L) 04/10/2020   MCV 97.1 04/10/2020   PLT 168 04/10/2020     Chemistry      Component Value Date/Time    NA 138 04/10/2020 0413   K 3.5 04/10/2020 0413   CL 104 04/10/2020 0413   CO2 25 04/10/2020 0413   BUN 10 04/10/2020 0413   CREATININE 0.64 04/10/2020 0413      Component Value Date/Time   CALCIUM 8.9 04/10/2020 0413   ALKPHOS 180 (H) 04/10/2020 0413   AST 30 04/10/2020 0413   ALT 34 04/10/2020 0413   BILITOT 1.0 04/10/2020 0413        Impression and Plan:   39 year old man with:  1.    Chronic portal vein thrombosis with initially diagnosed in December 2020.    This occurred in the setting of severe pancreatitis and acute illness.  Imaging studies including his MRI of the abdomen completed on July 8 as well as CT scan on April 09, 2020 were reviewed which showed no acute thrombosis.  Chronic portal vein thrombosis with cavernous transformation is noted which is expected in the setting of chronic thrombosis.  The differential diagnosis for these findings were reviewed at this time.  Inherited thrombophilia is considered less likely given the presentation and a clear provoking factors.  Risks and benefits of obtaining a hypercoagulable panel were discussed at this time. The yield is overall fairly low at this time.  Possibility of a myeloproliferative disorder is also considered less likely given his normal CBC.  At this time I recommended continued active surveillance and reevaluate possible need for hypercoagulable work-up if he has additional thrombosis in the future.     2.  Recurrent pancreatitis and gastritis: Resolved at this time with dietary modification..  3.  Follow-up: No routine follow-up is required and any follow-up will be as needed.  30  minutes were spent on this encounter.  Time was dedicated to reviewing imaging studies, discussing differential diagnosis and answering questions regarding future plan of care.   Paul Hose, MD 10/6/20213:48 PM

## 2020-11-25 ENCOUNTER — Other Ambulatory Visit: Payer: Self-pay

## 2020-11-25 ENCOUNTER — Encounter (INDEPENDENT_AMBULATORY_CARE_PROVIDER_SITE_OTHER): Payer: Self-pay | Admitting: Primary Care

## 2020-11-25 ENCOUNTER — Ambulatory Visit (INDEPENDENT_AMBULATORY_CARE_PROVIDER_SITE_OTHER): Payer: 59 | Admitting: Primary Care

## 2020-11-25 VITALS — BP 129/73 | HR 77 | Temp 98.5°F | Resp 16 | Wt 166.2 lb

## 2020-11-25 DIAGNOSIS — Z23 Encounter for immunization: Secondary | ICD-10-CM | POA: Diagnosis not present

## 2020-11-25 NOTE — Progress Notes (Signed)
Established Patient Office Visit  Subjective:  Patient ID: Paul Allen, male    DOB: [birth year]  Age: 39 y.o. MRN: [id number]  CC:  Chief Complaint  Patient presents with  . health form    HPI Paul Allen is a 39 year old male presents for health maintenance. He has been monitoring diet, exercising ,gain weight and no acute pancreatis attack   Past Medical History:  Diagnosis Date  . Abdominal pain   . Mesenteric vein thrombosis (HCC)    Dec 2020 in setting of pancreatitis  . Nausea & vomiting   . Pancreatitis     Past Surgical History:  Procedure Laterality Date  . APPENDECTOMY    . BIOPSY  01/29/2020   Procedure: BIOPSY;  Surgeon: Rachael Fee, MD;  Location: [facility];  Service: Gastroenterology;;  . BIOPSY  03/17/2020   Procedure: BIOPSY;  Surgeon: Lemar Lofty., MD;  Location: [facility];  Service: Gastroenterology;;  . ERCP N/A 12/06/2019   Procedure: ENDOSCOPIC RETROGRADE CHOLANGIOPANCREATOGRAPHY (ERCP);  Surgeon: Rachael Fee, MD;  Location: [facility];  Service: Endoscopy;  Laterality: N/A;  Aborted Procedure  . ERCP N/A 12/11/2019   Procedure: ENDOSCOPIC RETROGRADE CHOLANGIOPANCREATOGRAPHY (ERCP);  Surgeon: Lemar Lofty., MD;  Location: [facility];  Service: Gastroenterology;  Laterality: N/A;  . ESOPHAGOGASTRODUODENOSCOPY (EGD) WITH PROPOFOL N/A 01/29/2020   Procedure: ESOPHAGOGASTRODUODENOSCOPY (EGD) WITH PROPOFOL;  Surgeon: Rachael Fee, MD;  Location: [facility];  Service: Gastroenterology;  Laterality: N/A;  . ESOPHAGOGASTRODUODENOSCOPY (EGD) WITH PROPOFOL N/A 03/17/2020   Procedure: ESOPHAGOGASTRODUODENOSCOPY (EGD) WITH PROPOFOL;  Surgeon: Meridee Score Netty Starring., MD;  Location: [facility];  Service: Gastroenterology;  Laterality: N/A;  . PANCREATIC STENT PLACEMENT  12/11/2019   Procedure: PANCREATIC STENT PLACEMENT;  Surgeon: Meridee Score Netty Starring., MD;  Location: [facility];  Service:  Gastroenterology;;  . Dennison Mascot  12/11/2019   Procedure: Dennison Mascot;  Surgeon: Lemar Lofty., MD;  Location: [facility];  Service: Gastroenterology;;  . Francine Graven REMOVAL  03/17/2020   Procedure: STENT REMOVAL;  Surgeon: Lemar Lofty., MD;  Location: [facility];  Service: Gastroenterology;;    Family History  Problem Relation Age of Onset  . Cholecystitis Mother   . Arthritis Mother   . Stomach cancer Father     Social History   Socioeconomic History  . Marital status: Single    Spouse name: Not on file  . Number of children: Not on file  . Years of education: Not on file  . Highest education level: Not on file  Occupational History  . Not on file  Tobacco Use  . Smoking status: Former Smoker    Packs/day: 1.50    Types: Cigarettes    Quit date: 11/19/2019    Years since quitting: 1.0  . Smokeless tobacco: Never Used  Vaping Use  . Vaping Use: Former  Substance and Sexual Activity  . Alcohol use: Not Currently    Comment: quit six months ago  . Drug use: Yes    Frequency: 7.0 times per week    Types: Marijuana  . Sexual activity: Yes  Other Topics Concern  . Not on file  Social History Narrative   Single no kids   Uninsured   Smoker - tobacco and marijuana   Alcohol - hx - stopped last time in 11/2019 w/ pancreatitis   Social Determinants of Health   Financial Resource Strain:   . Difficulty of Paying Living Expenses: Not on file  Food Insecurity:   . Worried About  Running Out of Food in the Last Year: Not on file  . Ran Out of Food in the Last Year: Not on file  Transportation Needs:   . Lack of Transportation (Medical): Not on file  . Lack of Transportation (Non-Medical): Not on file  Physical Activity:   . Days of Exercise per Week: Not on file  . Minutes of Exercise per Session: Not on file  Stress:   . Feeling of Stress : Not on file  Social Connections:   . Frequency of Communication with Friends and Family: Not on file   . Frequency of Social Gatherings with Friends and Family: Not on file  . Attends Religious Services: Not on file  . Active Member of Clubs or Organizations: Not on file  . Attends Banker Meetings: Not on file  . Marital Status: Not on file  Intimate Partner Violence:   . Fear of Current or Ex-Partner: Not on file  . Emotionally Abused: Not on file  . Physically Abused: Not on file  . Sexually Abused: Not on file    Outpatient Medications Prior to Visit  Medication Sig Dispense Refill  . acetaminophen (TYLENOL) 500 MG tablet Take 1,000 mg by mouth every 6 (six) hours as needed for moderate pain or headache. (Patient not taking: Reported on 09/23/2020)    . allopurinol (ZYLOPRIM) 100 MG tablet Take 1 tablet (100 mg total) by mouth daily. 90 tablet 3  . amphetamine-dextroamphetamine (ADDERALL) 10 MG tablet Take 10 mg by mouth 2 (two) times daily.    Marland Kitchen apixaban (ELIQUIS) 5 MG TABS tablet Take 1 tablet (5 mg total) by mouth 2 (two) times daily. (Patient not taking: Reported on 09/23/2020) 60 tablet 0  . folic acid (FOLVITE) 1 MG tablet Take 1 tablet (1 mg total) by mouth daily. (Patient not taking: Reported on 09/23/2020)    . ondansetron (ZOFRAN) 4 MG tablet Take 1 tablet (4 mg total) by mouth every 6 (six) hours as needed for nausea. (Patient not taking: Reported on 05/22/2020) 20 tablet 0  . pantoprazole (PROTONIX) 40 MG tablet Take 1 tablet (40 mg total) by mouth 2 (two) times daily. 60 tablet 3  . sucralfate (CARAFATE) 1 g tablet Take 1 tablet (1 g total) by mouth 4 (four) times daily -  with meals and at bedtime. (Patient not taking: Reported on 09/23/2020) 120 tablet 2  . thiamine 100 MG tablet Take 1 tablet (100 mg total) by mouth daily. (Patient not taking: Reported on 09/23/2020)     Facility-Administered Medications Prior to Visit  Medication Dose Route Frequency Provider Last Rate Last Admin  . nicotine polacrilex (NICORETTE) gum 2 mg  2 mg Oral PRN Grayce Sessions, NP         Allergies  Allergen Reactions  . Penicillins Rash    Did it involve swelling of the face/tongue/throat, SOB, or low BP? No Did it involve sudden or severe rash/hives, skin peeling, or any reaction on the inside of your mouth or nose? Yes Did you need to seek medical attention at a hospital or doctor's office? Yes When did it last happen?childhood allergy If all above answers are "NO", may proceed with cephalosporin use.     ROS Review of Systems  All other systems reviewed and are negative.     Objective:    Physical Exam Vitals reviewed.  Constitutional:      Appearance: Normal appearance.  HENT:     Head: Normocephalic.     Nose: Nose  normal.  Eyes:     Extraocular Movements: Extraocular movements intact.     Pupils: Pupils are equal, round, and reactive to light.  Cardiovascular:     Rate and Rhythm: Normal rate and regular rhythm.  Pulmonary:     Effort: Pulmonary effort is normal.     Breath sounds: Normal breath sounds.  Abdominal:     General: There is distension.     Palpations: Abdomen is soft.  Musculoskeletal:        General: Normal range of motion.     Cervical back: Normal range of motion.  Skin:    General: Skin is warm and dry.  Neurological:     Mental Status: He is alert and oriented to person, place, and time.  Psychiatric:        Mood and Affect: Mood normal.        Behavior: Behavior normal.        Thought Content: Thought content normal.        Judgment: Judgment normal.     BP 129/73   Pulse 77   Temp 98.5 F (36.9 C)   Resp 16   Wt 166 lb 3.2 oz (75.4 kg)   SpO2 97%   BMI 24.54 kg/m  Wt Readings from Last 3 Encounters:  11/25/20 166 lb 3.2 oz (75.4 kg)  09/23/20 158 lb 3.2 oz (71.8 kg)  05/22/20 158 lb 3.2 oz (71.8 kg)     Health Maintenance Due  Topic Date Due  . COVID-19 Vaccine (1) Never done    There are no preventive care reminders to display for this patient.  Lab Results  Component Value Date    TSH 0.951 02/04/2020   Lab Results  Component Value Date   WBC 6.6 04/10/2020   HGB 12.2 (L) 04/10/2020   HCT 36.5 (L) 04/10/2020   MCV 97.1 04/10/2020   PLT 168 04/10/2020   Lab Results  Component Value Date   NA 138 04/10/2020   K 3.5 04/10/2020   CO2 25 04/10/2020   GLUCOSE 99 04/10/2020   BUN 10 04/10/2020   CREATININE 0.64 04/10/2020   BILITOT 1.0 04/10/2020   ALKPHOS 180 (H) 04/10/2020   AST 30 04/10/2020   ALT 34 04/10/2020   PROT 6.5 04/10/2020   ALBUMIN 4.0 04/10/2020   CALCIUM 8.9 04/10/2020   ANIONGAP 9 04/10/2020   GFR 155.89 11/28/2019   Lab Results  Component Value Date   CHOL 162 07/05/2019   Lab Results  Component Value Date   HDL 46 07/05/2019   Lab Results  Component Value Date   LDLCALC 66 07/05/2019   Lab Results  Component Value Date   TRIG 252 (H) 07/05/2019   Lab Results  Component Value Date   CHOLHDL 3.5 07/05/2019   Lab Results  Component Value Date   HGBA1C 5.3 12/14/2019      Assessment & Plan:  Kalee was seen today for health form.  Diagnoses and all orders for this visit:  Need for immunization against influenza Health maintenance and care gap-      Flu Vaccine QUAD 36+ mos IM Patient is to upload COVID vaccine to show complete  No orders of the defined types were placed in this encounter.   Follow-up: Return in about 1 year (around 11/25/2021), or yearly check up.    Grayce Sessions, NP

## 2020-11-25 NOTE — Patient Instructions (Signed)
Influenza Virus Vaccine injection (Fluarix) What is this medicine? INFLUENZA VIRUS VACCINE (in floo EN zuh VAHY ruhs vak SEEN) helps to reduce the risk of getting influenza also known as the flu. This medicine may be used for other purposes; ask your health care provider or pharmacist if you have questions. COMMON BRAND NAME(S): Fluarix, Fluzone What should I tell my health care provider before I take this medicine? They need to know if you have any of these conditions:  bleeding disorder like hemophilia  fever or infection  Guillain-Barre syndrome or other neurological problems  immune system problems  infection with the human immunodeficiency virus (HIV) or AIDS  low blood platelet counts  multiple sclerosis  an unusual or allergic reaction to influenza virus vaccine, eggs, chicken proteins, latex, gentamicin, other medicines, foods, dyes or preservatives  pregnant or trying to get pregnant  breast-feeding How should I use this medicine? This vaccine is for injection into a muscle. It is given by a health care professional. A copy of Vaccine Information Statements will be given before each vaccination. Read this sheet carefully each time. The sheet may change frequently. Talk to your pediatrician regarding the use of this medicine in children. Special care may be needed. Overdosage: If you think you have taken too much of this medicine contact a poison control center or emergency room at once. NOTE: This medicine is only for you. Do not share this medicine with others. What if I miss a dose? This does not apply. What may interact with this medicine?  chemotherapy or radiation therapy  medicines that lower your immune system like etanercept, anakinra, infliximab, and adalimumab  medicines that treat or prevent blood clots like warfarin  phenytoin  steroid medicines like prednisone or cortisone  theophylline  vaccines This list may not describe all possible  interactions. Give your health care provider a list of all the medicines, herbs, non-prescription drugs, or dietary supplements you use. Also tell them if you smoke, drink alcohol, or use illegal drugs. Some items may interact with your medicine. What should I watch for while using this medicine? Report any side effects that do not go away within 3 days to your doctor or health care professional. Call your health care provider if any unusual symptoms occur within 6 weeks of receiving this vaccine. You may still catch the flu, but the illness is not usually as bad. You cannot get the flu from the vaccine. The vaccine will not protect against colds or other illnesses that may cause fever. The vaccine is needed every year. What side effects may I notice from receiving this medicine? Side effects that you should report to your doctor or health care professional as soon as possible:  allergic reactions like skin rash, itching or hives, swelling of the face, lips, or tongue Side effects that usually do not require medical attention (report to your doctor or health care professional if they continue or are bothersome):  fever  headache  muscle aches and pains  pain, tenderness, redness, or swelling at site where injected  weak or tired This list may not describe all possible side effects. Call your doctor for medical advice about side effects. You may report side effects to FDA at 1-800-FDA-1088. Where should I keep my medicine? This vaccine is only given in a clinic, pharmacy, doctor's office, or other health care setting and will not be stored at home. NOTE: This sheet is a summary. It may not cover all possible information. If you have questions   about this medicine, talk to your doctor, pharmacist, or health care provider.  2020 Elsevier/Gold Standard (2008-07-02 09:30:40)  

## 2020-12-15 IMAGING — CT CT ABD-PELV W/O CM
2 of 4 series · 15 of 46 positions shown, 17 images · non-contrast
Comparison: January 06, 2020

CLINICAL DATA: Nausea and vomiting.

EXAM:
CT ABDOMEN AND PELVIS WITHOUT CONTRAST
TECHNIQUE: Multidetector CT imaging of the abdomen and pelvis was performed
following the standard protocol without IV contrast.

[Series 2: axial st · axial · 0.68mm/px · z∈[-512,-92]mm · 12 of 94 slices shown, 14 images]
[im 5/94  soft-tissue]
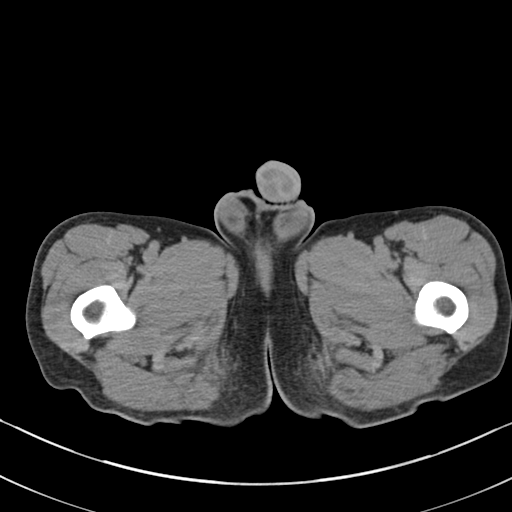
[im 5/94  bone]
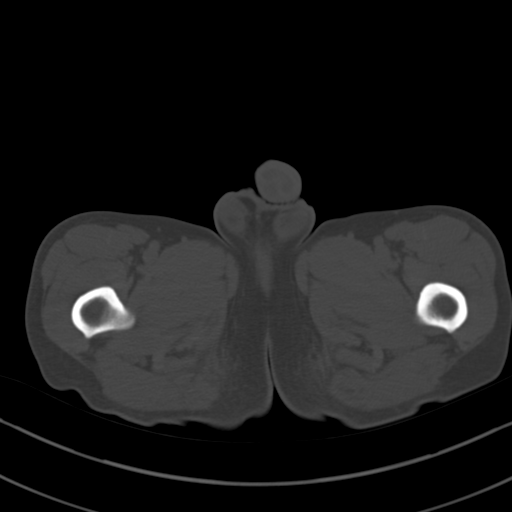
[im 15/94  soft-tissue]
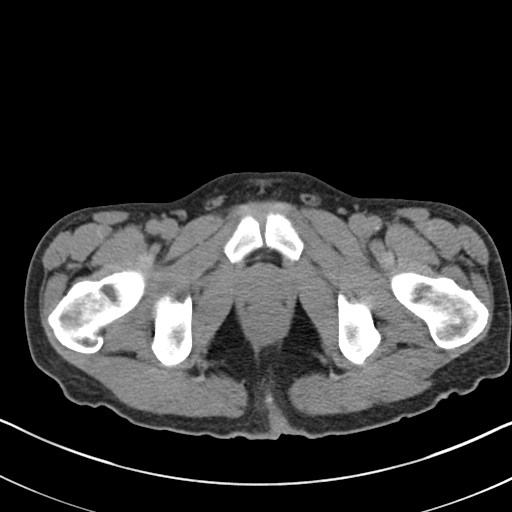
[im 20/94  soft-tissue]
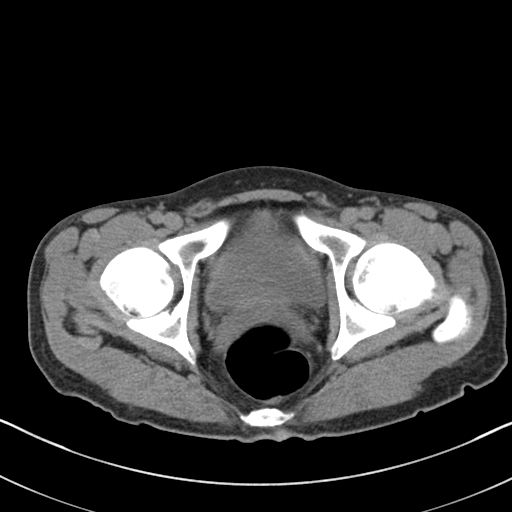
[im 30/94  soft-tissue]
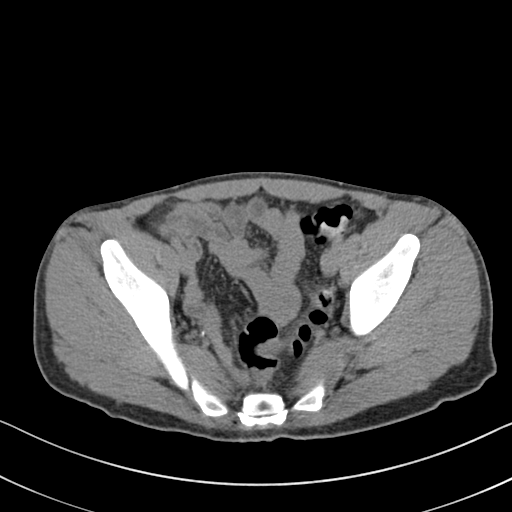
[im 35/94  soft-tissue]
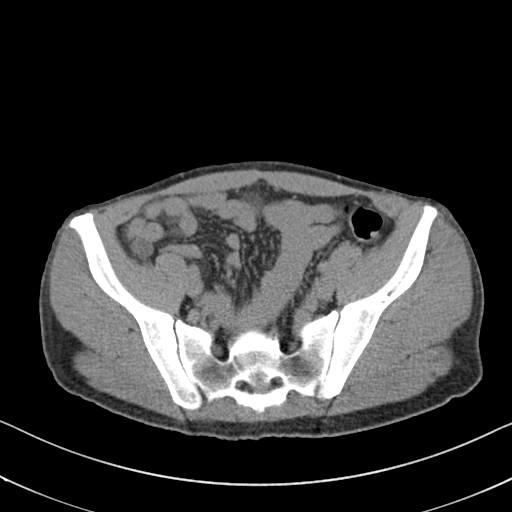
[im 45/94  soft-tissue]
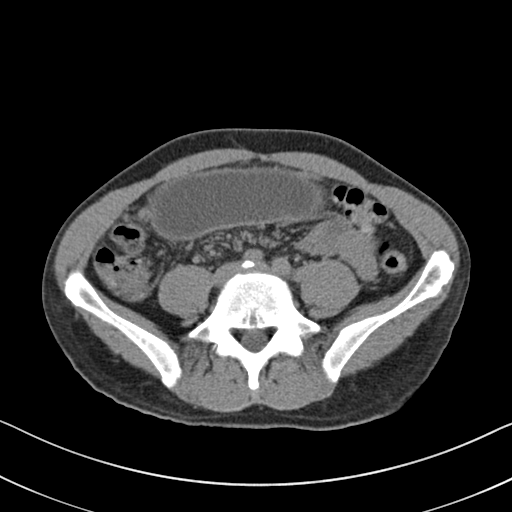
[im 49/94  soft-tissue]
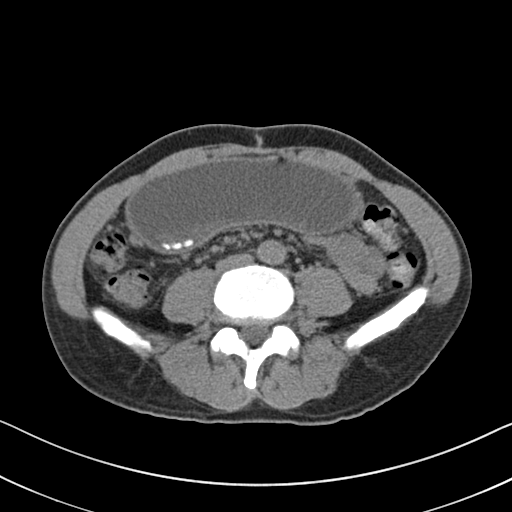
[im 59/94  soft-tissue]
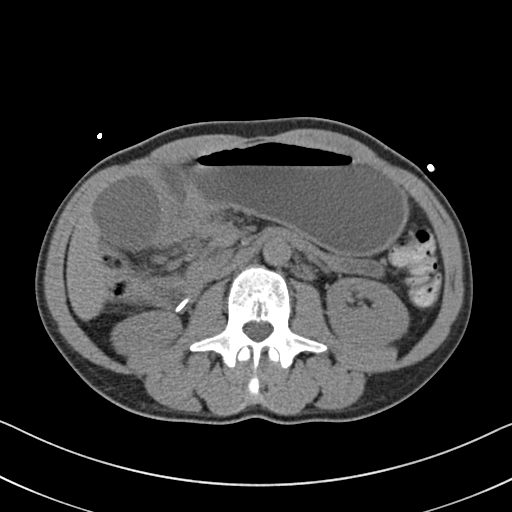
[im 64/94  soft-tissue]
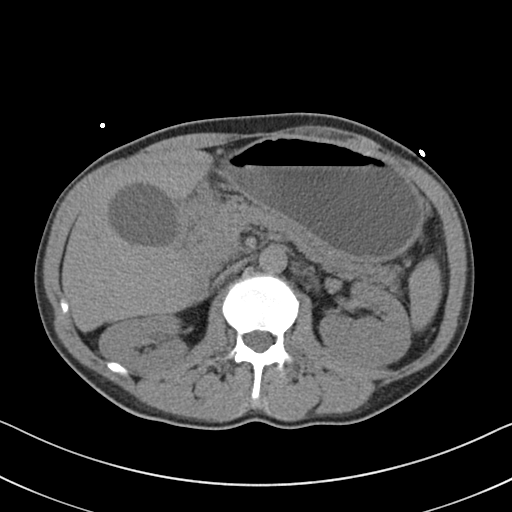
[im 64/94  bone]
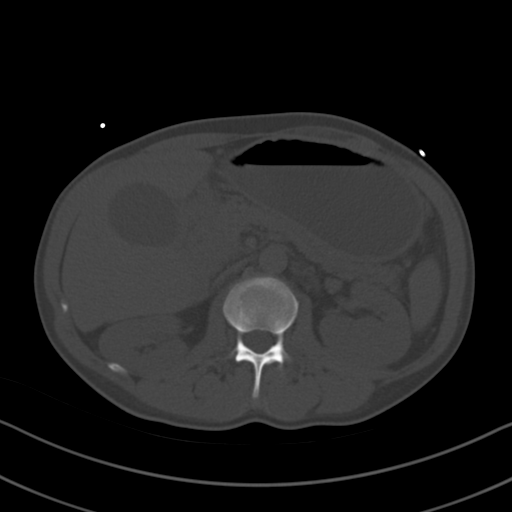
[im 74/94  soft-tissue]
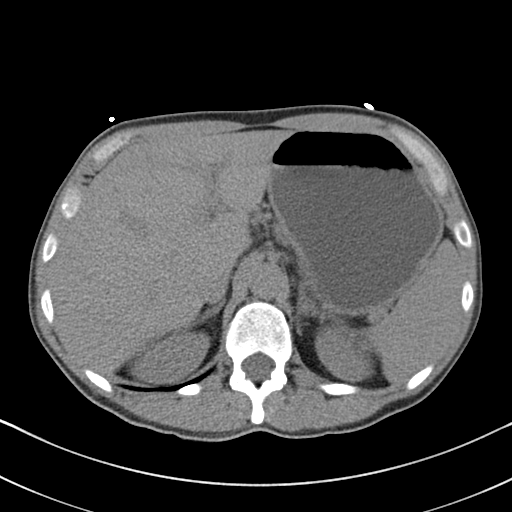
[im 79/94  soft-tissue]
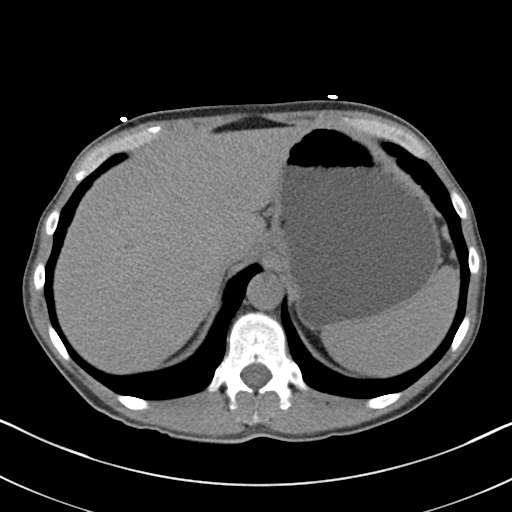
[im 89/94  soft-tissue]
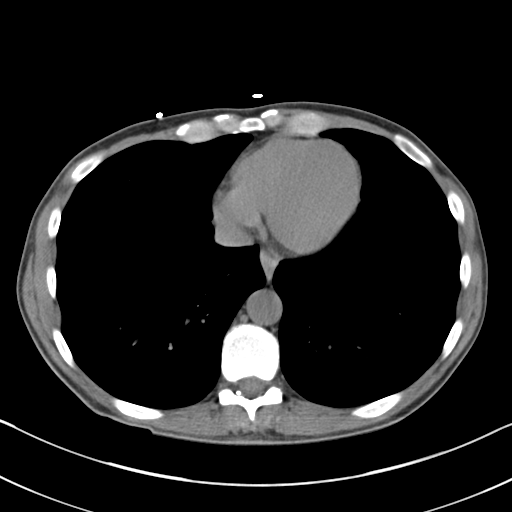

[Series 5: coronal st · coronal · 0.68mm/px · 3 of 124 slices shown]
[im 42/124  soft-tissue]
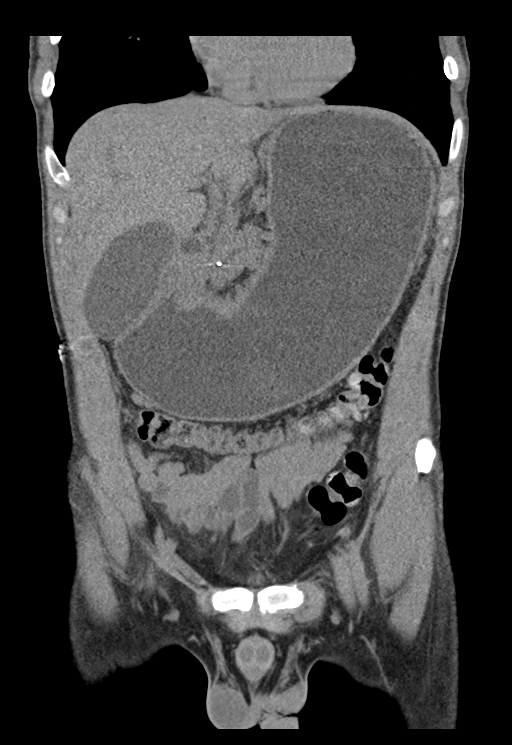
[im 55/124  soft-tissue]
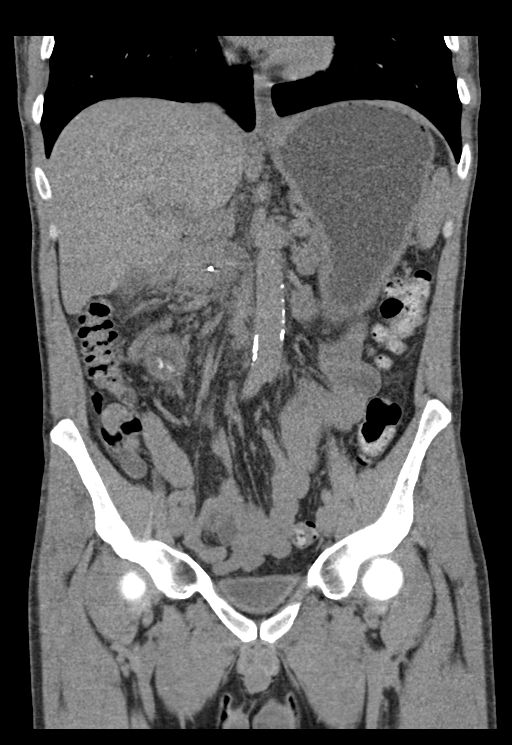
[im 69/124  soft-tissue]
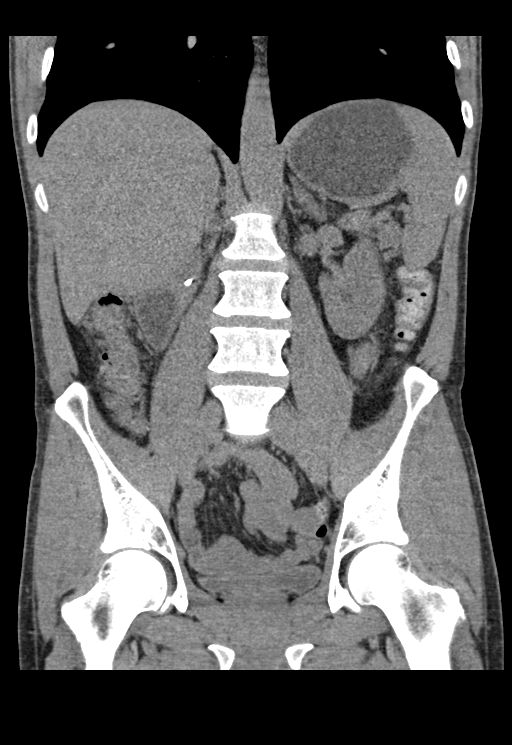

[15 of 46 positions shown; findings below may reference images not displayed]

FINDINGS: Lower chest: No acute abnormality.

Hepatobiliary: No focal liver abnormality is seen. The areas of
heterogeneous enhancement seen throughout the liver on the prior
contrast enhanced CT are not clearly identified on this noncontrast
enhanced study P the gallbladder is moderately distended without
evidence of gallstones, gallbladder wall thickening, or biliary
dilatation.

Pancreas: The pancreatic duct stent is again seen and unchanged in
position. The pancreatic parenchyma is normal in appearance.

Spleen: Normal in size without focal abnormality.

Adrenals/Urinary Tract: Adrenal glands are unremarkable. Kidneys are
normal, without renal calculi, focal lesion, or hydronephrosis.
Bladder is unremarkable.

Stomach/Bowel: There is marked severity gastric distension. The
visualized loops of large and small bowel are normal in caliber. The
appendix is surgically absent. No evidence of bowel wall thickening,
distention, or inflammatory changes. Noninflamed diverticula are
seen throughout the sigmoid colon.

Vascular/Lymphatic: There is moderate severity aortic
atherosclerosis. No enlarged abdominal or pelvic lymph nodes.

Reproductive: Prostate is unremarkable.

Other: No abdominal wall hernia or abnormality. No abdominopelvic
ascites.

Musculoskeletal: No acute or significant osseous findings.
IMPRESSION: 1. Marked severity gastric distension, of uncertain etiology.
Sequelae associated with gastric outlet obstruction cannot be
excluded.
2. Stable pancreatic duct stent positioning.
3. Sigmoid diverticulosis.

Aortic Atherosclerosis (1BWOC-WIN.N).

## 2020-12-16 ENCOUNTER — Ambulatory Visit: Admit: 2020-12-16 | Discharge status: Against Medical Advice | Payer: 59

## 2020-12-17 ENCOUNTER — Other Ambulatory Visit: Payer: Self-pay

## 2020-12-17 ENCOUNTER — Ambulatory Visit
Admission: EM | Admit: 2020-12-17 | Discharge: 2020-12-17 | Disposition: A | Discharge status: Home / Self Care | Payer: 59 | Attending: Emergency Medicine | Admitting: Emergency Medicine

## 2020-12-17 DIAGNOSIS — M545 Low back pain, unspecified: Secondary | ICD-10-CM | POA: Diagnosis not present

## 2020-12-17 MED ORDER — TIZANIDINE HCL 4 MG PO TABS
4.0000 mg | ORAL_TABLET | Freq: Four times a day (QID) | ORAL | 0 refills | Status: DC | PRN
Start: 2020-12-17 — End: 2021-08-30

## 2020-12-17 MED ORDER — KETOROLAC TROMETHAMINE 30 MG/ML IJ SOLN
30.0000 mg | Freq: Once | INTRAMUSCULAR | Status: AC
  Administered 2020-12-17: 30 mg via INTRAMUSCULAR

## 2020-12-17 MED ORDER — NAPROXEN 500 MG PO TABS
500.0000 mg | ORAL_TABLET | Freq: Two times a day (BID) | ORAL | 0 refills | Status: DC
Start: 2020-12-17 — End: 2021-08-30

## 2020-12-17 NOTE — ED Triage Notes (Signed)
Pt presents with severe lower back pain for the past few days after bending over.

## 2020-12-17 NOTE — ED Provider Notes (Signed)
[facility]    CSN: [id number] Arrival date & time: 12/17/20  0815      History   Chief Complaint Chief Complaint  Patient presents with   Back Pain    HPI Paul Allen is a 39 y.o. male history of alcohol abuse, pancreatitis, presenting today for evaluation of back pain. Reports slight bending while putting pants on and felt a tight sensation in lower back. Mainly right lower back. Walking with cane which is not normal for him. Denies history of prior back problems. Reports regular exercise and active lifestyle. Denies radiation into extremities. Denies saddle anesthesia. Denies loss of control of urination. Does reports urinary frequency. Denies history of stones. Ibuprofen 600 mg, ice and heating pad without relief.   HPI  Past Medical History:  Diagnosis Date   Abdominal pain    Mesenteric vein thrombosis (HCC)    Dec 2020 in setting of pancreatitis   Nausea & vomiting    Pancreatitis     Patient Active Problem List   Diagnosis Date Noted   Influenza vaccine needed 11/25/2020   Abdominal pain 04/09/2020   Duodenitis    Gastric outlet obstruction    Gastritis without bleeding    Nausea & vomiting 01/26/2020   Chronic alcoholic pancreatitis (HCC) 01/07/2020   ARF (acute renal failure) (HCC) 01/07/2020   Pancreatic duct leak    Choledocholithiasis    Bile leak    Pancreatic ascites    Abdominal pain, epigastric    Portal vein thrombosis    Pancreatitis, acute 12/03/2019   Elevated LFTs    Elevated hemoglobin (HCC)    Elevated bilirubin 11/28/2019   Effusion, right knee    Acute pancreatitis 10/25/2018   Acute alcoholic pancreatitis 10/24/2018   Colitis 10/24/2018   Hyponatremia 10/24/2018   Hypokalemia 10/24/2018   Macrocytosis 10/24/2018   Alcohol abuse 10/24/2018    Past Surgical History:  Procedure Laterality Date   APPENDECTOMY     BIOPSY  01/29/2020   Procedure: BIOPSY;  Surgeon: Rachael Fee,  MD;  Location: [facility];  Service: Gastroenterology;;   BIOPSY  03/17/2020   Procedure: BIOPSY;  Surgeon: Lemar Lofty., MD;  Location: [facility];  Service: Gastroenterology;;   ERCP N/A 12/06/2019   Procedure: ENDOSCOPIC RETROGRADE CHOLANGIOPANCREATOGRAPHY (ERCP);  Surgeon: Rachael Fee, MD;  Location: [facility];  Service: Endoscopy;  Laterality: N/A;  Aborted Procedure   ERCP N/A 12/11/2019   Procedure: ENDOSCOPIC RETROGRADE CHOLANGIOPANCREATOGRAPHY (ERCP);  Surgeon: Lemar Lofty., MD;  Location: [facility];  Service: Gastroenterology;  Laterality: N/A;   ESOPHAGOGASTRODUODENOSCOPY (EGD) WITH PROPOFOL N/A 01/29/2020   Procedure: ESOPHAGOGASTRODUODENOSCOPY (EGD) WITH PROPOFOL;  Surgeon: Rachael Fee, MD;  Location: [facility];  Service: Gastroenterology;  Laterality: N/A;   ESOPHAGOGASTRODUODENOSCOPY (EGD) WITH PROPOFOL N/A 03/17/2020   Procedure: ESOPHAGOGASTRODUODENOSCOPY (EGD) WITH PROPOFOL;  Surgeon: Meridee Score Netty Starring., MD;  Location: [facility];  Service: Gastroenterology;  Laterality: N/A;   PANCREATIC STENT PLACEMENT  12/11/2019   Procedure: PANCREATIC STENT PLACEMENT;  Surgeon: Lemar Lofty., MD;  Location: [facility];  Service: Gastroenterology;;   Dennison Mascot  12/11/2019   Procedure: Dennison Mascot;  Surgeon: Meridee Score Netty Starring., MD;  Location: [facility];  Service: Gastroenterology;;   Francine Graven REMOVAL  03/17/2020   Procedure: STENT REMOVAL;  Surgeon: Lemar Lofty., MD;  Location: [facility];  Service: Gastroenterology;;       Home Medications    Prior to Admission medications   Medication Sig Start Date End Date Taking? Authorizing Provider  naproxen (NAPROSYN) 500 MG tablet Take 1 tablet (500 mg total) by mouth 2 (two) times daily. With food 12/17/20  Yes Zelene Barga C, PA-C  tiZANidine (ZANAFLEX) 4 MG tablet Take 1 tablet (4 mg total) by mouth every 6 (six) hours as needed for muscle  spasms. 12/17/20  Yes Kamea Dacosta C, PA-C  allopurinol (ZYLOPRIM) 100 MG tablet Take 1 tablet (100 mg total) by mouth daily. 05/26/20   Grayce Sessions, NP  amphetamine-dextroamphetamine (ADDERALL) 10 MG tablet Take 10 mg by mouth 2 (two) times daily. 04/10/20   [provider]  pantoprazole (PROTONIX) 40 MG tablet Take 1 tablet (40 mg total) by mouth 2 (two) times daily. 03/17/20 04/30/20  Mansouraty, Netty Starring., MD  sucralfate (CARAFATE) 1 g tablet Take 1 tablet (1 g total) by mouth 4 (four) times daily -  with meals and at bedtime. Patient not taking: Reported on 09/23/2020 04/10/20 12/17/20  Alwyn Ren, MD    Family History Family History  Problem Relation Age of Onset   Cholecystitis Mother    Arthritis Mother    Stomach cancer Father     Social History Social History   Tobacco Use   Smoking status: Former Smoker    Packs/day: 1.50    Types: Cigarettes    Quit date: 11/19/2019    Years since quitting: 1.0   Smokeless tobacco: Never Used  Vaping Use   Vaping Use: Former  Substance Use Topics   Alcohol use: Not Currently    Comment: quit six months ago   Drug use: Yes    Frequency: 7.0 times per week    Types: Marijuana     Allergies   Penicillins   Review of Systems Review of Systems  Constitutional: Negative for fatigue and fever.  Eyes: Negative for redness, itching and visual disturbance.  Respiratory: Negative for shortness of breath.   Cardiovascular: Negative for chest pain and leg swelling.  Gastrointestinal: Negative for nausea and vomiting.  Musculoskeletal: Positive for back pain and myalgias. Negative for arthralgias.  Skin: Negative for color change, rash and wound.  Neurological: Negative for dizziness, syncope, weakness, light-headedness and headaches.     Physical Exam Triage Vital Signs ED Triage Vitals  Enc Vitals Group     BP      Pulse      Resp      Temp      Temp src      SpO2      Weight      Height       Head Circumference      Peak Flow      Pain Score      Pain Loc      Pain Edu?      Excl. in GC?    No data found.  Updated Vital Signs BP 129/78 (BP Location: Right Arm)    Pulse 81    Temp 98.3 F (36.8 C) (Oral)    Resp 18    SpO2 97%   Visual Acuity Right Eye Distance:   Left Eye Distance:   Bilateral Distance:    Right Eye Near:   Left Eye Near:    Bilateral Near:     Physical Exam Vitals and nursing note reviewed.  Constitutional:      Appearance: He is well-developed and well-nourished.     Comments: No acute distress  HENT:     Head: Normocephalic and atraumatic.     Nose: Nose normal.  Eyes:  Conjunctiva/sclera: Conjunctivae normal.  Cardiovascular:     Rate and Rhythm: Normal rate.  Pulmonary:     Effort: Pulmonary effort is normal. No respiratory distress.  Abdominal:     General: There is no distension.  Musculoskeletal:        General: Normal range of motion.     Cervical back: Neck supple.     Comments: Nontender to palpation of thoracic spine midline, diffuse tenderness throughout lumbar spine midline, no palpable deformity or step-off, diffuse tenderness throughout bilateral lumbar musculature as well, strength at hips and knees 5/5 and equal bilaterally, pain elicited with resisted hip flexion as well as resisted knee extension  Ambulating with cane  Skin:    General: Skin is warm and dry.  Neurological:     Mental Status: He is alert and oriented to person, place, and time.  Psychiatric:        Mood and Affect: Mood and affect normal.      UC Treatments / Results  Labs (all labs ordered are listed, but only abnormal results are displayed) Labs Reviewed - No data to display  EKG   Radiology No results found.  Procedures Procedures (including critical care time)  Medications Ordered in UC Medications  ketorolac (TORADOL) 30 MG/ML injection 30 mg (has no administration in time range)    Initial Impression / Assessment  and Plan / UC Course  I have reviewed the triage vital signs and the nursing notes.  Pertinent labs & imaging results that were available during my care of the patient were reviewed by me and considered in my medical decision making (see chart for details).     Back pain likely muscular in nature given mechanism of injury, do not suspect acute bony abnormality. Recommending continued anti-inflammatories and muscle relaxers with activity modification. No red flags for cauda equina. Toradol prior to discharge.  Discussed strict return precautions. Patient verbalized understanding and is agreeable with plan.  Final Clinical Impressions(s) / UC Diagnoses   Final diagnoses:  Acute bilateral low back pain without sciatica     Discharge Instructions     We gave you an injection of Toradol Naprosyn twice daily with food for pain Supplement tizanidine which is a muscle relaxer as needed at home at bedtime-may cause some drowsiness Gentle stretching Alternate ice and heat Follow-up if not improving or worsening    ED Prescriptions    Medication Sig Dispense Auth. Provider   naproxen (NAPROSYN) 500 MG tablet Take 1 tablet (500 mg total) by mouth 2 (two) times daily. With food 30 tablet Kijuan Gallicchio C, PA-C   tiZANidine (ZANAFLEX) 4 MG tablet Take 1 tablet (4 mg total) by mouth every 6 (six) hours as needed for muscle spasms. 30 tablet Nashiya Disbrow, [place] C, PA-C     PDMP not reviewed this encounter.   Lew Dawes, [place] 12/17/20 [phone]

## 2020-12-17 NOTE — Discharge Instructions (Addendum)
We gave you an injection of Toradol Naprosyn twice daily with food for pain Supplement tizanidine which is a muscle relaxer as needed at home at bedtime-may cause some drowsiness Gentle stretching Alternate ice and heat Follow-up if not improving or worsening

## 2020-12-19 ENCOUNTER — Telehealth: Payer: 59 | Admitting: Nurse Practitioner

## 2020-12-19 DIAGNOSIS — M545 Low back pain, unspecified: Secondary | ICD-10-CM

## 2020-12-19 NOTE — Progress Notes (Signed)
Based on what you shared with me it looks like you have back pain,that should be evaluated in a face to face office visit. There is nothing else I can do in an evisit other then what you are already taking. If getting no better you need a face to face visit.    NOTE: If you entered your credit card information for this eVisit, you will not be charged. You may see a "hold" on your card for the $35 but that hold will drop off and you will not have a charge processed.  If you are having a true medical emergency please call 911.     For an urgent face to face visit, [facility] has four urgent care centers for your convenience:   . [facility]    [phone]                  Get Driving Directions  [address] [place], [place] [address] . 10 am to 8 pm Monday-Friday . 12 pm to 8 pm Saturday-Sunday   . [facility] at [facility]  [phone]                  Get Driving Directions  [address] [place], [place] [address] . 8 am to 8 pm Monday-Friday . 9 am to 6 pm Saturday . 11 am to 6 pm Sunday   . [facility] at [facility]  [phone]                  Get Driving Directions   [address] [place], [place] [address] . 8 am to 8 pm Monday-Friday . 8 am to 4 pm Saturday-Sunday    . [facility] at [facility] Directions  [phone]  [address] [place], [place] [address]  . Monday-Friday, 12 PM to 6 PM    Your e-visit answers were reviewed by a board certified advanced clinical practitioner to complete your personal care plan.  Thank you for using e-Visits.

## 2021-03-01 NOTE — Addendum Note (Signed)
Addended by: Grayce Sessions on: 03/01/2021 10:04 PM   Modules accepted: Level of Service

## 2021-08-30 ENCOUNTER — Encounter: Payer: Self-pay | Admitting: Emergency Medicine

## 2021-08-30 ENCOUNTER — Emergency Department
Admission: EM | Admit: 2021-08-30 | Discharge: 2021-08-30 | Disposition: A | Discharge status: Home / Self Care | Payer: 59 | Attending: Emergency Medicine | Admitting: Emergency Medicine

## 2021-08-30 ENCOUNTER — Other Ambulatory Visit: Payer: Self-pay

## 2021-08-30 ENCOUNTER — Ambulatory Visit
Admission: EM | Admit: 2021-08-30 | Discharge: 2021-08-30 | Disposition: A | Discharge status: Home / Self Care | Payer: 59

## 2021-08-30 DIAGNOSIS — Z87891 Personal history of nicotine dependence: Secondary | ICD-10-CM | POA: Insufficient documentation

## 2021-08-30 DIAGNOSIS — S61011A Laceration without foreign body of right thumb without damage to nail, initial encounter: Secondary | ICD-10-CM | POA: Insufficient documentation

## 2021-08-30 DIAGNOSIS — W25XXXA Contact with sharp glass, initial encounter: Secondary | ICD-10-CM | POA: Insufficient documentation

## 2021-08-30 DIAGNOSIS — Z23 Encounter for immunization: Secondary | ICD-10-CM | POA: Insufficient documentation

## 2021-08-30 DIAGNOSIS — S6991XA Unspecified injury of right wrist, hand and finger(s), initial encounter: Secondary | ICD-10-CM | POA: Diagnosis present

## 2021-08-30 MED ORDER — FENTANYL CITRATE (PF) 250 MCG/5ML IJ SOLN
100.0000 ug | Freq: Once | INTRAMUSCULAR | Status: AC
  Administered 2021-08-30: 100 ug via INTRAMUSCULAR
  Filled 2021-08-30: qty 5

## 2021-08-30 MED ORDER — LIDOCAINE HCL (PF) 1 % IJ SOLN
INTRAMUSCULAR | Status: AC
  Filled 2021-08-30: qty 5

## 2021-08-30 MED ORDER — LIDOCAINE-EPINEPHRINE 2 %-1:100000 IJ SOLN
20.0000 mL | Freq: Once | INTRAMUSCULAR | Status: AC
  Administered 2021-08-30: 20 mL
  Filled 2021-08-30: qty 1

## 2021-08-30 MED ORDER — LIDOCAINE HCL (CARDIAC) PF 100 MG/5ML IV SOSY
PREFILLED_SYRINGE | INTRAVENOUS | Status: AC
  Filled 2021-08-30: qty 5

## 2021-08-30 MED ORDER — DOXYCYCLINE MONOHYDRATE 100 MG PO TABS: 100.0000 mg | ORAL_TABLET | Freq: Two times a day (BID) | ORAL | 0 refills | Status: AC

## 2021-08-30 MED ORDER — TETANUS-DIPHTH-ACELL PERTUSSIS 5-2.5-18.5 LF-MCG/0.5 IM SUSY
0.5000 mL | PREFILLED_SYRINGE | Freq: Once | INTRAMUSCULAR | Status: AC
  Administered 2021-08-30: 0.5 mL via INTRAMUSCULAR
  Filled 2021-08-30: qty 0.5

## 2021-08-30 NOTE — ED Triage Notes (Signed)
Presents s/p fall   states he slipped and tried to catch himself    had a glass bottle in hand  bottle   the bottle broke  laceration to lateral right thumb area

## 2021-08-30 NOTE — ED Notes (Signed)
Patient is being discharged from the Urgent Care and sent to the Emergency Department via private vehicle . Per B.Michiel Cowboy PA, patient is in need of higher level of care due to laceration. Patient is aware and verbalizes understanding of plan of care.  Vitals:   08/30/21 1225  BP: (!) 149/95  Pulse: 83  Resp: 18  Temp: 98.2 F (36.8 C)  SpO2: 97%

## 2021-08-30 NOTE — ED Triage Notes (Signed)
Laceration to palm of right hand x 1 hour, he fell on glass bowl, pcp notified and at bedside

## 2021-08-30 NOTE — Discharge Instructions (Signed)
Take Doxycycline twice daily for seven days.  Have sutures removed in seven days.

## 2021-08-30 NOTE — ED Provider Notes (Signed)
40 year old male presenting for laceration of the right palmar hypothenar region.  Patient says that he felt a piece of glass about an hour ago and has been through a towel.  He continues to have significant bleeding.  He says the pain is not too bad but he cannot get the bleeding to stop.  Very quick assessment performed.  He does bleed through 2 boxes of gauze when trying to assess the wound but I am unable to assess the depth of the wound due to the bleeding.  Good pulses.  I wrapped the hand into boxes of gauze and wrapped with Coban.  Instructed patient to proceed to the emergency department this time.  Concern for arterial bleed and/or deep wound.  Patient leaving in stable condition to [facility] ED.  A friend is taking him.   Shirlee Latch, PA-C 08/30/21 1227

## 2021-08-30 NOTE — ED Provider Notes (Signed)
[facility]-EMERGENCY DEPARTMENT  ____________________________________________  Time seen: Approximately 4:03 PM  I have reviewed the triage vital signs and the nursing notes.   HISTORY  Chief Complaint Laceration (/)   Historian Patient     HPI Paul Allen is a 40 y.o. male dents to the emergency department with a 3 cm laceration along the thenar eminence of the right hand.  Laceration is deep to underlying adipose tissue.  Patient was seen and evaluated at urgent care earlier in the day and was referred to the emergency department for further care and management.  No numbness or tingling of the right hand.  No other alleviating measures have been attempted.   Past Medical History:  Diagnosis Date   Abdominal pain    Mesenteric vein thrombosis [facility])    Dec 2020 in setting of pancreatitis   Nausea & vomiting    Pancreatitis      Immunizations up to date:  Yes.     Past Medical History:  Diagnosis Date   Abdominal pain    Mesenteric vein thrombosis (HCC)    Dec 2020 in setting of pancreatitis   Nausea & vomiting    Pancreatitis     Patient Active Problem List   Diagnosis Date Noted   Influenza vaccine needed 11/25/2020   Abdominal pain 04/09/2020   Duodenitis    Gastric outlet obstruction    Gastritis without bleeding    Nausea & vomiting 01/26/2020   Chronic alcoholic pancreatitis (HCC) 01/07/2020   ARF (acute renal failure) (HCC) 01/07/2020   Pancreatic duct leak    Choledocholithiasis    Bile leak    Pancreatic ascites    Abdominal pain, epigastric    Portal vein thrombosis    Pancreatitis, acute 12/03/2019   Elevated LFTs    Elevated hemoglobin (HCC)    Elevated bilirubin 11/28/2019   Effusion, right knee    Acute pancreatitis 10/25/2018   Acute alcoholic pancreatitis 10/24/2018   Colitis 10/24/2018   Hyponatremia 10/24/2018   Hypokalemia 10/24/2018   Macrocytosis 10/24/2018   Alcohol abuse 10/24/2018    Past Surgical History:  Procedure  Laterality Date   APPENDECTOMY     BIOPSY  01/29/2020   Procedure: BIOPSY;  Surgeon: Rachael Fee, MD;  Location: [facility];  Service: Gastroenterology;;   BIOPSY  03/17/2020   Procedure: BIOPSY;  Surgeon: Lemar Lofty., MD;  Location: [facility];  Service: Gastroenterology;;   ERCP N/A 12/06/2019   Procedure: ENDOSCOPIC RETROGRADE CHOLANGIOPANCREATOGRAPHY (ERCP);  Surgeon: Rachael Fee, MD;  Location: [facility];  Service: Endoscopy;  Laterality: N/A;  Aborted Procedure   ERCP N/A 12/11/2019   Procedure: ENDOSCOPIC RETROGRADE CHOLANGIOPANCREATOGRAPHY (ERCP);  Surgeon: Lemar Lofty., MD;  Location: [facility];  Service: Gastroenterology;  Laterality: N/A;   ESOPHAGOGASTRODUODENOSCOPY (EGD) WITH PROPOFOL N/A 01/29/2020   Procedure: ESOPHAGOGASTRODUODENOSCOPY (EGD) WITH PROPOFOL;  Surgeon: Rachael Fee, MD;  Location: [facility];  Service: Gastroenterology;  Laterality: N/A;   ESOPHAGOGASTRODUODENOSCOPY (EGD) WITH PROPOFOL N/A 03/17/2020   Procedure: ESOPHAGOGASTRODUODENOSCOPY (EGD) WITH PROPOFOL;  Surgeon: Meridee Score Netty Starring., MD;  Location: [facility];  Service: Gastroenterology;  Laterality: N/A;   PANCREATIC STENT PLACEMENT  12/11/2019   Procedure: PANCREATIC STENT PLACEMENT;  Surgeon: Lemar Lofty., MD;  Location: [facility];  Service: Gastroenterology;;   Dennison Mascot  12/11/2019   Procedure: Dennison Mascot;  Surgeon: Meridee Score Netty Starring., MD;  Location: [facility];  Service: Gastroenterology;;   Francine Graven REMOVAL  03/17/2020   Procedure: STENT REMOVAL;  Surgeon: Lemar Lofty., MD;  Location: [facility];  Service: Gastroenterology;;    Prior to Admission medications   Medication Sig Start Date End Date Taking? Authorizing Provider  doxycycline (ADOXA) 100 MG tablet Take 1 tablet (100 mg total) by mouth 2 (two) times daily for 7 days. 08/30/21 09/06/21 Yes Pia Mau M, PA-C  allopurinol (ZYLOPRIM) 100 MG tablet Take  1 tablet (100 mg total) by mouth daily. 05/26/20   Grayce Sessions, NP  amphetamine-dextroamphetamine (ADDERALL) 10 MG tablet Take 10 mg by mouth 2 (two) times daily. 04/10/20   [provider]  pantoprazole (PROTONIX) 40 MG tablet Take 1 tablet (40 mg total) by mouth 2 (two) times daily. 03/17/20 04/30/20  Mansouraty, Netty Starring., MD  sucralfate (CARAFATE) 1 g tablet Take 1 tablet (1 g total) by mouth 4 (four) times daily -  with meals and at bedtime. Patient not taking: Reported on 09/23/2020 04/10/20 12/17/20  Alwyn Ren, MD    Allergies Penicillins  Family History  Problem Relation Age of Onset   Cholecystitis Mother    Arthritis Mother    Stomach cancer Father     Social History Social History   Tobacco Use   Smoking status: Former    Packs/day: 1.50    Types: Cigarettes    Quit date: 11/19/2019    Years since quitting: 1.7   Smokeless tobacco: Never  Vaping Use   Vaping Use: Former  Substance Use Topics   Alcohol use: Not Currently    Comment: quit six months ago   Drug use: Yes    Frequency: 7.0 times per week    Types: Marijuana     Review of Systems  Constitutional: No fever/chills Eyes:  No discharge ENT: No upper respiratory complaints. Respiratory: no cough. No SOB/ use of accessory muscles to breath Gastrointestinal:   No nausea, no vomiting.  No diarrhea.  No constipation. Musculoskeletal: Patient has right hand laceration.  Skin: Negative for rash, abrasions, lacerations, ecchymosis.   ____________________________________________   PHYSICAL EXAM:  VITAL SIGNS: ED Triage Vitals  Enc Vitals Group     BP 08/30/21 1329 118/80     Pulse Rate 08/30/21 1329 75     Resp 08/30/21 1329 20     Temp 08/30/21 1329 98.1 F (36.7 C)     Temp Source 08/30/21 1329 Oral     SpO2 08/30/21 1329 96 %     Weight 08/30/21 1327 166 lb 3.6 oz (75.4 kg)     Height 08/30/21 1327 5\' 9"  (1.753 m)     Head Circumference --      Peak Flow --      Pain  Score 08/30/21 1336 3     Pain Loc --      Pain Edu? --      Excl. in GC? --      Constitutional: Alert and oriented. Well appearing and in no acute distress. Eyes: Conjunctivae are normal. PERRL. EOMI. Head: Atraumatic. ENT: Cardiovascular: Normal rate, regular rhythm. Normal S1 and S2.  Good peripheral circulation. Respiratory: Normal respiratory effort without tachypnea or retractions. Lungs CTAB. Good air entry to the bases with no decreased or absent breath sounds Gastrointestinal: Bowel sounds x 4 quadrants. Soft and nontender to palpation. No guarding or rigidity. No distention. Musculoskeletal: Full range of motion to all extremities. No obvious deformities noted Neurologic:  Normal for age. No gross focal neurologic deficits are appreciated.  Skin: Patient has a 3 cm laceration along right thenar eminence.  Laceration is deep to underlying  adipose tissue. Psychiatric: Mood and affect are normal for age. Speech and behavior are normal.   ____________________________________________   LABS (all labs ordered are listed, but only abnormal results are displayed)  Labs Reviewed - No data to display ____________________________________________  EKG   ____________________________________________  RADIOLOGY   No results found.  ____________________________________________    PROCEDURES  Procedure(s) performed:     Marland KitchenMarland KitchenLaceration Repair  Date/Time: 08/30/2021 4:06 PM Performed by: Orvil Feil, PA-C Authorized by: Orvil Feil, PA-C   Consent:    Consent obtained:  Verbal   Risks discussed:  Infection and pain Universal protocol:    Procedure explained and questions answered to patient or proxy's satisfaction: yes     Patient identity confirmed:  Verbally with patient Anesthesia:    Anesthesia method:  Local infiltration   Local anesthetic:  Lidocaine 1% WITH epi Laceration details:    Location:  Hand   Hand location:  R palm   Length (cm):  3    Depth (mm):  5 Pre-procedure details:    Preparation:  Patient was prepped and draped in usual sterile fashion Exploration:    Hemostasis achieved with:  Epinephrine   Imaging outcome: foreign body not noted     Contaminated: no   Treatment:    Area cleansed with:  Povidone-iodine   Irrigation volume:  500   Irrigation method:  Tap   Debridement:  None   Undermining:  Minimal Skin repair:    Repair method:  Sutures   Suture size:  4-0   Suture technique:  Running Approximation:    Approximation:  Close Repair type:    Repair type:  Simple Post-procedure details:    Dressing:  Open (no dressing)     Medications  lidocaine (cardiac) 100 mg/101mL (XYLOCAINE) 100 MG/5ML injection 2% (0 mg  Hold 08/30/21 1528)  lidocaine (PF) (XYLOCAINE) 1 % injection (0 mg  Hold 08/30/21 1528)  Tdap (BOOSTRIX) injection 0.5 mL (has no administration in time range)  fentaNYL citrate (PF) (SUBLIMAZE) injection 100 mcg (100 mcg Intramuscular Given 08/30/21 1526)  lidocaine-EPINEPHrine (XYLOCAINE W/EPI) 2 %-1:100000 (with pres) injection 20 mL (20 mLs Infiltration Given by Other 08/30/21 1528)     ____________________________________________   INITIAL IMPRESSION / ASSESSMENT AND PLAN / ED COURSE  Pertinent labs & imaging results that were available during my care of the patient were reviewed by me and considered in my medical decision making (see chart for details).       Assessment and plan Laceration 40 year old male presents to the emergency department with a 3 cm laceration along the right thenar eminence.  Vital signs are reassuring at triage.  On physical exam, patient was alert, active and nontoxic-appearing.  Laceration was repaired in the emergency department without complication advised to have sutures removed by primary care in 7 days.  He was discharged with doxycycline.  Tetanus status was updated in the emergency department.  All patient questions were  answered.    ____________________________________________  FINAL CLINICAL IMPRESSION(S) / ED DIAGNOSES  Final diagnoses:  Laceration of right thumb without foreign body without damage to nail, initial encounter      NEW MEDICATIONS STARTED DURING THIS VISIT:  ED Discharge Orders          Ordered    doxycycline (ADOXA) 100 MG tablet  2 times daily        08/30/21 1557                This chart was dictated  using voice recognition software/Dragon. Despite best efforts to proofread, errors can occur which can change the meaning. Any change was purely unintentional.     Orvil FeilWoods, Tom Ragsdale M, PA-C 08/30/21 1608    Minna AntisPaduchowski, Kevin, MD 08/30/21 1650

## 2021-09-10 ENCOUNTER — Ambulatory Visit
Admission: EM | Admit: 2021-09-10 | Discharge: 2021-09-10 | Disposition: A | Discharge status: Home / Self Care | Payer: 59

## 2021-09-10 DIAGNOSIS — Z4802 Encounter for removal of sutures: Secondary | ICD-10-CM | POA: Diagnosis not present

## 2021-09-10 NOTE — ED Triage Notes (Signed)
Pt here for rn visit suture removal. Sutures removed without complication. Education about infection provided.

## 2024-04-29 ENCOUNTER — Ambulatory Visit (HOSPITAL_COMMUNITY)
Admission: EM | Admit: 2024-04-29 | Discharge: 2024-04-29 | Disposition: A | Discharge status: Home / Self Care | Attending: Psychiatry | Admitting: Psychiatry

## 2024-04-29 DIAGNOSIS — Z653 Problems related to other legal circumstances: Secondary | ICD-10-CM | POA: Insufficient documentation

## 2024-04-29 DIAGNOSIS — Z008 Encounter for other general examination: Secondary | ICD-10-CM | POA: Insufficient documentation

## 2024-04-29 NOTE — ED Provider Notes (Signed)
 Behavioral Health Urgent Care Medical Screening Exam  Patient Name: Paul Allen MRN: [id number] Date of Evaluation: 04/29/24 Chief Complaint:  "Probation required me to come in for an evaluation". Diagnosis:  Final diagnoses:  Encounter for psychological evaluation   History of Present illness: Paul Allen is a 43 y.o. male patient presented to [facility] as a walk in unaccompanied requesting mental health evaluation requested by probation officer  Marchelle Sessions, 43 y.o., male patient seen face to face by this provider, chart reviewed, and consulted with Dr. Genita Keys on 04/29/24.  Patient endorses a past psychiatric history that includes ADHD.  He currently does not take any medication and has no services in place.  He is living with his mother.  He is working for a [employer].  He denies all substance use.  He is currently on probation but denies any upcoming court dates.  On evaluation JEANETTE DIFRANCO reports roughly 2-1/2 years ago he was stopped for driving under the influence DUI and blew 0.007 on breathalyzer.  He was barely under the legal limit but was charged with DUI.  He has remained on probation since that time.  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  States his new Engineer, drilling is by the book.  He made the comment to the officer that he would like to receive therapy and probation officer now has required him to come in and get a mental health evaluation.  During evaluation [name] is observed sitting in the assessment area in no acute distress.  He is well-groomed and makes good eye contact.  He has normal speech and behavior.  He denies any current depression and has a euthymic affect.  He does express his frustration as he feels the court has mandated him to come in to get an evaluation even though he only told them that he wanted therapy.  He denies any suicidal or homicidal ideations.  He denies access to firearms/weapons.  He denies any history of SI  attempts or psychiatric admissions.  He denies any auditory or visual hallucinations.  He does not appear psychotic, manic, delusional, or paranoid.  He does not appear to be responding to internal/external stimuli.  He was able to answer questions appropriately.  He is requesting resources for therapy.  Provided [facility] outpatient open access walk-in hours for therapy and medication management.  Also explained to patient that a more detailed mental health evaluation could be completed in the outpatient setting if crisis assessment was not sufficient for court/probation officer.  At this time [name] is educated and verbalizes understanding of mental health resources and other crisis services in the community. He is instructed to call 911 and present to the nearest emergency room should he experience any suicidal/homicidal ideation, auditory/visual/hallucinations, or detrimental worsening of his mental health condition.      Flowsheet Row ED from 04/29/2024 in [facility] ED from 08/30/2021 in [facility] Emergency Department at [facility]  C-SSRS RISK CATEGORY No Risk No Risk       Psychiatric Specialty Exam  Presentation  General Appearance:Appropriate for Environment; Well Groomed  Eye Contact:Good  Speech:Clear and Coherent; Normal Rate  Speech Volume:Normal  Handedness:Right   Mood and Affect  Mood: Euthymic  Affect: Congruent   Thought Process  Thought Processes: Coherent  Descriptions of Associations:Intact  Orientation:Full (Time, Place and Person)  Thought Content:Logical    Hallucinations:None  Ideas of Reference:None  Suicidal Thoughts:No  Homicidal  Thoughts:No   Sensorium  Memory: Immediate Good; Recent Good; Remote Good  Judgment: Good  Insight: Good   Executive Functions  Concentration: Good  Attention Span: Good  Recall: Good  Fund of Knowledge: Good  Language: Good   Psychomotor  Activity  Psychomotor Activity: Normal   Assets  Assets: Communication Skills; Desire for Improvement; Financial Resources/Insurance; Housing; Physical Health; Resilience; Social Support; Vocational/Educational   Sleep  Sleep: Good  Number of hours: No data recorded  Physical Exam: Physical Exam Constitutional:      Appearance: Normal appearance.  Pulmonary:     Effort: Pulmonary effort is normal.  Musculoskeletal:        General: Normal range of motion.     Cervical back: Normal range of motion.  Neurological:     Mental Status: He is alert and oriented to person, place, and time.  Psychiatric:        Attention and Perception: Attention and perception normal.        Mood and Affect: Mood and affect normal.        Speech: Speech normal.        Behavior: Behavior normal. Behavior is cooperative.        Thought Content: Thought content normal.        Judgment: Judgment normal.    Review of Systems  Constitutional:  Negative for chills and fever.  HENT:  Negative for hearing loss.   Respiratory:  Negative for cough and shortness of breath.   Musculoskeletal: Negative.   Neurological:  Negative for tremors.  Psychiatric/Behavioral: Negative.     Blood pressure (!) 150/89, pulse 75, resp. rate 20, SpO2 99%. There is no height or weight on file to calculate BMI.  Musculoskeletal: Strength & Muscle Tone: within normal limits Gait & Station: normal Patient leans: N/A   [facility] MSE Discharge Disposition for Follow up and Recommendations: Based on my evaluation the patient does not appear to have an emergency medical condition and can be discharged with resources and follow up care in outpatient services for Medication Management and Individual Therapy  Discharge patient.  Provided outpatient psychiatric resources for medication management and therapy including open access walk-in hours to [facility] on the second floor.   Costella Dirks, NP 04/29/2024, 12:18 PM

## 2024-04-29 NOTE — Discharge Instructions (Signed)
Discharge recommendations:   Medications: Patient is to take medications as prescribed. The patient or patient's guardian is to contact a medical professional and/or outpatient provider to address any new side effects that develop. The patient or the patient's guardian should update outpatient providers of any new medications and/or medication changes.    Outpatient Follow up: Please review list of outpatient resources for psychiatry and counseling. Please follow up with your primary care provider for all medical related needs.    Therapy: We recommend that patient participate in individual therapy to address mental health concerns.   Atypical antipsychotics: If you are prescribed an atypical antipsychotic, it is recommended that your height, weight, BMI, blood pressure, fasting lipid panel, and fasting blood sugar be monitored by your outpatient providers.  Safety:   The following safety precautions should be taken:   No sharp objects. This includes scissors, razors, scrapers, and putty knives.   Chemicals should be removed and locked up.   Medications should be removed and locked up.   Weapons should be removed and locked up. This includes firearms, knives and instruments that can be used to cause injury.   The patient should abstain from use of illicit substances/drugs and abuse of any medications.  If symptoms worsen or do not continue to improve or if the patient becomes actively suicidal or homicidal then it is recommended that the patient return to the closest hospital emergency department, the [facility], or call 911 for further evaluation and treatment. National Suicide Prevention Lifeline 1-800-SUICIDE or 567-265-1791.  About 988 988 offers 24/7 access to trained crisis counselors who can help people experiencing mental health-related distress. People can call or text 988 or chat 988lifeline.org for themselves or if they are worried about a  loved one who may need crisis support.   Based on what you have shared, a list of resources for outpatient therapy and psychiatry is provided below to get you started back on treatment.  It is imperative that you follow through with treatment within 5-7 days from the day of discharge to prevent any further risk to your safety or mental well-being.  You are not limited to the list provided.  In case of an urgent crisis, you may contact the Mobile Crisis Unit with [facility] at 1.562-460-1106.        Outpatient Services for Therapy and Medication Management for [facility] [address], [place], [address] [phone] phone  New Patient Assessment/Therapy Walk-ins Monday and Wednesday: 8am until slots are full. Every 1st and 2nd Friday: 1pm - 5pm  NO ASSESSMENT/THERAPY WALK-INS ON TUESDAYS OR THURSDAYS  New Patient Psychiatry/Medication Management Walk-ins Monday-Friday: 8am-11am  For all walk-ins, we ask that you arrive by 7:30am because patient will be seen in the order of arrival.  Availability is limited; therefore, you may not be seen on the same day that you walk-in.  Our goal is to serve and meet the needs of our community to the best of our ability.   [facility] [address] [place], [place], [address] [phone] phone  [facility] [address] [place], [place], [address] [phone] phone [phone] phone ([address], Anthem[employer], [address], [address], [address], [address], Healthy [place], [place], [place], [address], [employer], [place], UHC, [employer], [place], Out of Network)  [employer], [place] [address] [place], [place], [address] [phone] phone (Monia Pouch, Anthem[employer]vance, [employer], [place],  [address], [place], [place], [place], [place], [employer],  [place], [place], [place], [facility])  [employer] 970 438 [address] [place], [place], [address] [phone] phone (Medicaid, ask about other insurance)  [facility] [address] [place], [place], [address] [phone] phone [phone] fax ([address] [place] , [place], [place], [place] Health Choice, UHC, [employer], Self-Pay)  [facility] [address] [place], [place], [address] [phone] phone ([address] Anthem/Elevance, [address], [address], [place], [employer], [place], [place], [place], [employer], [place], [place], [place], [facility])  [facility] - 6-8 MONTH WAIT FOR THERAPY; SOONER FOR MEDICATION MANAGEMENT [address], [address] [place], [place], [address] [phone] phone ([address] [employer] [address] - [place], [address], [place], [place], [employer] Plans, [address], BCBS Healthy [place], [place], [address], [place], [place], [place], [place], Tricare, UHC, [employer], [place])  [facility] [address], [address] [place], [place], [address] [phone] phone  [facility] [address], [address] [place], [place], [address] [phone] phone  [facility] [address] [place], [place], [address] [phone] phone  [facility] [place] - THERAPY ONLY [address] [place], [address] [phone] phone  [facility], [place] [address], [place], [address] [phone] phone  [facility] [address] [place], [place], [address] [phone] phone [phone] fax  [facility] [address] [place], [place], [address] [phone] phone [phone] fax  [facility] [phone] [address] [place], [place], [address] [phone] phone  Jovita Kussmaul [address] [place], [place], [address]  [phone] phone  [facility]   (Adults Only) [address] [place], [place], [address]  [phone] phone [phone] fax
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
## 2024-07-19 ENCOUNTER — Telehealth (HOSPITAL_COMMUNITY): Payer: Self-pay

## 2024-07-19 NOTE — Telephone Encounter (Signed)
 Paul Allen from [facility] emails requesting update on this pt. Therapist secure emails the following message: No interaction with [facility] or [facility].  Darice Simpler, MS, LMFT, LCSW

## 2024-09-17 ENCOUNTER — Telehealth (HOSPITAL_COMMUNITY): Payer: Self-pay

## 2024-09-17 NOTE — Telephone Encounter (Signed)
 Receiving an email from Holley Sprung with [facility] asking for an update on any behavioral engagement with this facility with this pt.  Therapist sends the following email: Good afternoon, [facility] has not had any engagement with [facility].  Thank you.     Darice Simpler, MS, LMFT, LCAS
